# Patient Record
Sex: Female | Born: 1950
Health system: Southern US, Community
[De-identification: ages and names within clinical notes are randomized; demographics above are authoritative.]

## PROBLEM LIST (undated history)

## (undated) DIAGNOSIS — I639 Cerebral infarction, unspecified: Secondary | ICD-10-CM

## (undated) DIAGNOSIS — E669 Obesity, unspecified: Secondary | ICD-10-CM

## (undated) DIAGNOSIS — E785 Hyperlipidemia, unspecified: Secondary | ICD-10-CM

## (undated) DIAGNOSIS — N200 Calculus of kidney: Secondary | ICD-10-CM

## (undated) DIAGNOSIS — I10 Essential (primary) hypertension: Secondary | ICD-10-CM

## (undated) HISTORY — DX: Calculus of kidney: N20.0

## (undated) HISTORY — DX: Cerebral infarction, unspecified: I63.9

## (undated) HISTORY — DX: Hyperlipidemia, unspecified: E78.5

## (undated) HISTORY — DX: Obesity, unspecified: E66.9

---

## 1977-02-10 HISTORY — PX: TUBAL LIGATION: SHX77

## 1997-07-26 ENCOUNTER — Emergency Department (HOSPITAL_COMMUNITY): Admission: EM | Admit: 1997-07-26 | Discharge: 1997-07-26 | Payer: Self-pay | Admitting: Emergency Medicine

## 2007-02-11 HISTORY — PX: ROTATOR CUFF REPAIR: SHX139

## 2007-11-04 ENCOUNTER — Ambulatory Visit: Payer: Self-pay | Admitting: Cardiovascular Disease

## 2007-11-04 ENCOUNTER — Ambulatory Visit: Payer: Self-pay | Admitting: Orthopedic Surgery

## 2007-11-04 ENCOUNTER — Other Ambulatory Visit: Payer: Self-pay

## 2007-11-05 ENCOUNTER — Ambulatory Visit: Payer: Self-pay | Admitting: Orthopedic Surgery

## 2010-04-15 ENCOUNTER — Ambulatory Visit: Payer: Self-pay | Admitting: Family Medicine

## 2010-09-09 ENCOUNTER — Encounter (HOSPITAL_COMMUNITY): Payer: Self-pay | Admitting: Radiology

## 2010-09-09 ENCOUNTER — Emergency Department (HOSPITAL_COMMUNITY): Payer: BC Managed Care – PPO

## 2010-09-09 ENCOUNTER — Emergency Department (HOSPITAL_COMMUNITY)
Admission: EM | Admit: 2010-09-09 | Discharge: 2010-09-09 | Disposition: A | Discharge status: Home / Self Care | Payer: BC Managed Care – PPO | Attending: Emergency Medicine | Admitting: Emergency Medicine

## 2010-09-09 DIAGNOSIS — R197 Diarrhea, unspecified: Secondary | ICD-10-CM | POA: Insufficient documentation

## 2010-09-09 DIAGNOSIS — B9689 Other specified bacterial agents as the cause of diseases classified elsewhere: Secondary | ICD-10-CM | POA: Insufficient documentation

## 2010-09-09 DIAGNOSIS — R300 Dysuria: Secondary | ICD-10-CM | POA: Insufficient documentation

## 2010-09-09 DIAGNOSIS — N898 Other specified noninflammatory disorders of vagina: Secondary | ICD-10-CM | POA: Insufficient documentation

## 2010-09-09 DIAGNOSIS — Z79899 Other long term (current) drug therapy: Secondary | ICD-10-CM | POA: Insufficient documentation

## 2010-09-09 DIAGNOSIS — R109 Unspecified abdominal pain: Secondary | ICD-10-CM | POA: Insufficient documentation

## 2010-09-09 DIAGNOSIS — A499 Bacterial infection, unspecified: Secondary | ICD-10-CM | POA: Insufficient documentation

## 2010-09-09 DIAGNOSIS — R319 Hematuria, unspecified: Secondary | ICD-10-CM | POA: Insufficient documentation

## 2010-09-09 DIAGNOSIS — N12 Tubulo-interstitial nephritis, not specified as acute or chronic: Secondary | ICD-10-CM | POA: Insufficient documentation

## 2010-09-09 DIAGNOSIS — R6883 Chills (without fever): Secondary | ICD-10-CM | POA: Insufficient documentation

## 2010-09-09 DIAGNOSIS — R5381 Other malaise: Secondary | ICD-10-CM | POA: Insufficient documentation

## 2010-09-09 DIAGNOSIS — N760 Acute vaginitis: Secondary | ICD-10-CM | POA: Insufficient documentation

## 2010-09-09 DIAGNOSIS — E119 Type 2 diabetes mellitus without complications: Secondary | ICD-10-CM | POA: Insufficient documentation

## 2010-09-09 DIAGNOSIS — R112 Nausea with vomiting, unspecified: Secondary | ICD-10-CM | POA: Insufficient documentation

## 2010-09-09 DIAGNOSIS — R630 Anorexia: Secondary | ICD-10-CM | POA: Insufficient documentation

## 2010-09-09 DIAGNOSIS — R5383 Other fatigue: Secondary | ICD-10-CM | POA: Insufficient documentation

## 2010-09-09 HISTORY — DX: Essential (primary) hypertension: I10

## 2010-09-09 LAB — CBC
HCT: 38.4 % (ref 36.0–46.0)
Hemoglobin: 13.4 g/dL (ref 12.0–15.0)
MCV: 86.7 fL (ref 78.0–100.0)
Platelets: 204 10*3/uL (ref 150–400)
RBC: 4.43 MIL/uL (ref 3.87–5.11)
WBC: 11.5 10*3/uL — ABNORMAL HIGH (ref 4.0–10.5)

## 2010-09-09 LAB — WET PREP, GENITAL: Yeast Wet Prep HPF POC: NONE SEEN

## 2010-09-09 LAB — HEPATIC FUNCTION PANEL
AST: 14 U/L (ref 0–37)
Bilirubin, Direct: 0.1 mg/dL (ref 0.0–0.3)
Total Protein: 6.9 g/dL (ref 6.0–8.3)

## 2010-09-09 LAB — BASIC METABOLIC PANEL
BUN: 16 mg/dL (ref 6–23)
CO2: 27 mEq/L (ref 19–32)
Chloride: 104 mEq/L (ref 96–112)
Creatinine, Ser: 0.8 mg/dL (ref 0.50–1.10)
Glucose, Bld: 146 mg/dL — ABNORMAL HIGH (ref 70–99)

## 2010-09-09 LAB — URINALYSIS, ROUTINE W REFLEX MICROSCOPIC
Glucose, UA: NEGATIVE mg/dL
Ketones, ur: 15 mg/dL — AB
Protein, ur: >300 mg/dL — AB
Urobilinogen, UA: 0.2 mg/dL (ref 0.0–1.0)

## 2010-09-09 LAB — DIFFERENTIAL
Lymphocytes Relative: 16 % (ref 12–46)
Lymphs Abs: 1.9 10*3/uL (ref 0.7–4.0)
Monocytes Relative: 6 % (ref 3–12)
Neutro Abs: 8.9 10*3/uL — ABNORMAL HIGH (ref 1.7–7.7)
Neutrophils Relative %: 77 % (ref 43–77)

## 2010-09-09 LAB — URINE MICROSCOPIC-ADD ON

## 2010-09-09 LAB — LACTIC ACID, PLASMA: Lactic Acid, Venous: 2 mmol/L (ref 0.5–2.2)

## 2010-09-09 MED ORDER — IOHEXOL 300 MG/ML  SOLN
100.0000 mL | Freq: Once | INTRAMUSCULAR | Status: AC | PRN
  Administered 2010-09-09: 100 mL via INTRAVENOUS

## 2010-09-10 LAB — GC/CHLAMYDIA PROBE AMP, GENITAL
Chlamydia, DNA Probe: NEGATIVE
GC Probe Amp, Genital: NEGATIVE

## 2012-07-12 ENCOUNTER — Encounter (HOSPITAL_COMMUNITY): Payer: Self-pay | Admitting: Emergency Medicine

## 2012-07-12 ENCOUNTER — Emergency Department (HOSPITAL_COMMUNITY): Payer: BC Managed Care – PPO

## 2012-07-12 ENCOUNTER — Emergency Department (HOSPITAL_COMMUNITY)
Admission: EM | Admit: 2012-07-12 | Discharge: 2012-07-12 | Disposition: A | Discharge status: Home / Self Care | Payer: BC Managed Care – PPO | Attending: Emergency Medicine | Admitting: Emergency Medicine

## 2012-07-12 DIAGNOSIS — R12 Heartburn: Secondary | ICD-10-CM | POA: Insufficient documentation

## 2012-07-12 DIAGNOSIS — R079 Chest pain, unspecified: Secondary | ICD-10-CM

## 2012-07-12 DIAGNOSIS — I10 Essential (primary) hypertension: Secondary | ICD-10-CM

## 2012-07-12 DIAGNOSIS — R51 Headache: Secondary | ICD-10-CM | POA: Insufficient documentation

## 2012-07-12 DIAGNOSIS — Z79899 Other long term (current) drug therapy: Secondary | ICD-10-CM | POA: Insufficient documentation

## 2012-07-12 DIAGNOSIS — E119 Type 2 diabetes mellitus without complications: Secondary | ICD-10-CM | POA: Insufficient documentation

## 2012-07-12 DIAGNOSIS — R0789 Other chest pain: Secondary | ICD-10-CM | POA: Insufficient documentation

## 2012-07-12 LAB — BASIC METABOLIC PANEL
GFR calc Af Amer: >90 mL/min (ref >90)
GFR calc non Af Amer: 89 mL/min — ABNORMAL LOW (ref >90)
Glucose, Bld: 126 mg/dL — ABNORMAL HIGH (ref 70–99)
Potassium: 4.3 mEq/L (ref 3.5–5.1)
Sodium: 143 mEq/L (ref 135–145)

## 2012-07-12 LAB — POCT I-STAT TROPONIN I: Troponin i, poc: 0.01 ng/mL (ref 0.00–0.08)

## 2012-07-12 LAB — CBC
Hemoglobin: 14.3 g/dL (ref 12.0–15.0)
MCHC: 33.5 g/dL (ref 30.0–36.0)
RDW: 12.6 % (ref 11.5–15.5)
WBC: 6.5 10*3/uL (ref 4.0–10.5)

## 2012-07-12 MED ORDER — ASPIRIN 325 MG PO TABS
325.0000 mg | ORAL_TABLET | Freq: Once | ORAL | Status: DC
  Filled 2012-07-12: qty 1

## 2012-07-12 MED ORDER — ASPIRIN 325 MG PO TABS
325.0000 mg | ORAL_TABLET | Freq: Once | ORAL | Status: AC
Start: 2012-07-12 — End: 2012-07-12
  Administered 2012-07-12: 325 mg via ORAL
  Filled 2012-07-12: qty 1

## 2012-07-12 NOTE — ED Notes (Signed)
Pt c/o left sided CP intermittently x several days with radiation to arm and HA and groin pain; pt sent here for further eval by PCP

## 2012-07-12 NOTE — ED Notes (Signed)
ASA given By previous RN .

## 2012-07-12 NOTE — ED Provider Notes (Signed)
History     CSN: [id number]  Arrival date & time 07/12/12  1737   First MD Initiated Contact with Patient 07/12/12 1834      Chief Complaint  Patient presents with  . Chest Pain     Patient is a 62 y.o. female presenting with chest pain. The history is provided by the patient.  Chest Pain Pain location:  L chest Pain quality: aching   Pain radiates to:  L arm Pain radiates to the back: no   Pain severity:  Mild Onset quality:  Gradual Timing:  Intermittent Progression:  Improving Relieved by: belching. Worsened by:  Nothing tried Associated symptoms: headache and heartburn   Associated symptoms: no abdominal pain, no back pain, no diaphoresis, no dizziness, no lower extremity edema, no nausea, no near-syncope, no shortness of breath, no syncope and not vomiting   pt reports she has had episodes of left CP/arm pain  She reports episodes are brief (less than one minute) No associated diaphoresis/nausea/vomiting.  No weakness.  She is ambulatory.  No focal weakness She also mentions lower abdominal/groin pain that has since resolved.  She also reports mild HA and blurred vision early this morning that has since resolved. No exertional CP is reported No pleuritic pain is reported  She was seen by PCP and sent for evaluation She denies known h/o CAD    Past Medical History  Diagnosis Date  . Diabetes mellitus   . Hypertension    Family history -positive for CAD  History  Substance Use Topics  . Smoking status: Not on file  . Smokeless tobacco: Not on file  . Alcohol Use: Not on file    OB History   Grav Para Term Preterm Abortions TAB SAB Ect Mult Living                  Review of Systems  Constitutional: Negative for diaphoresis.  Respiratory: Negative for shortness of breath.   Cardiovascular: Positive for chest pain. Negative for syncope and near-syncope.  Gastrointestinal: Positive for heartburn. Negative for nausea, vomiting and abdominal pain.   Musculoskeletal: Negative for back pain.  Neurological: Positive for headaches. Negative for dizziness.  All other systems reviewed and are negative.    Allergies  Augmentin  Home Medications  No current outpatient prescriptions on file.  BP 179/78  Pulse 85  Temp(Src) 97.8 F (36.6 C) (Oral)  Resp 18  SpO2 95%  Physical Exam CONSTITUTIONAL: Well developed/well nourished HEAD: Normocephalic/atraumatic EYES: EOMI/PERRL ENMT: Mucous membranes moist NECK: supple no meningeal signs SPINE:entire spine nontender CV: S1/S2 noted, no murmurs/rubs/gallops noted LUNGS: Lungs are clear to auscultation bilaterally, no apparent distress ABDOMEN: soft, nontender, no rebound or guarding GU:no cva tenderness NEURO: Pt is awake/alert, moves all extremitiesx4 EXTREMITIES: pulses normal, full ROM, no calf tenderness, no LE edema SKIN: warm, color normal PSYCH: no abnormalities of mood noted  ED Course  Procedures  Labs Reviewed  BASIC METABOLIC PANEL - Abnormal; Notable for the following:    Glucose, Bld 126 (*)    GFR calc non Af Amer 89 (*)    All other components within normal limits  CBC  POCT I-STAT TROPONIN I   7:55 PM PCP office records and EKG reviewed PCP Felt patient should be monitored for CP Her CP is very atypical (several seconds of pain, no associated symptoms)   She has no active pain Will recheck troponin/ekg at 3hr mark (approximately 2030)  She can f/u with cardiology as outpatient I doubt PE/Dissection  at this time  Signed out to dr Patria Mane and Florentina Addison schinlever to f/u on ekg/troponin  MDM  Nursing notes including past medical history and social history reviewed and considered in documentation xrays reviewed and considered Labs/vital reviewed and considered        Date: 07/12/2012 - 1744  Rate: 72  Rhythm: normal sinus rhythm  QRS Axis: normal  Intervals: normal  ST/T Wave abnormalities: nonspecific ST changes  Conduction Disutrbances:none   Narrative Interpretation:   Old EKG Reviewed: compared to prior from PCP's office earlier today, no abrupt change   Date: 07/12/2012 1509 (from PCP office)  Rate: 58  Rhythm: sinus bradycardia  QRS Axis: normal  Intervals: normal  ST/T Wave abnormalities: nonspecific ST changes  Conduction Disutrbances:none     Joya Gaskins, MD 07/12/12 2005

## 2012-07-13 NOTE — ED Provider Notes (Signed)
Medical screening examination/treatment/procedure(s) were conducted as a shared visit with non-physician practitioner(s) and myself.  I personally evaluated the patient during the encounter   Joya Gaskins, MD 07/13/12 1109

## 2012-07-13 NOTE — ED Provider Notes (Signed)
Repeat EKG unchanged and delta troponin neg.  Results discussed w/ pt.  She continues to be asymptomatic.  D/c'd home.  Return precautions discussed.    Date: 07/13/2012  Rate: 66  Rhythm: normal sinus rhythm  QRS Axis: normal  Intervals: normal  ST/T Wave abnormalities: normal  Conduction Disutrbances:none  Narrative Interpretation:   Old EKG Reviewed: unchanged    Otilio Miu, PA-C 07/13/12 0009

## 2012-09-07 ENCOUNTER — Ambulatory Visit: Payer: BC Managed Care – PPO | Admitting: Cardiovascular Disease

## 2012-11-01 ENCOUNTER — Encounter: Payer: Self-pay | Admitting: *Deleted

## 2012-11-02 ENCOUNTER — Ambulatory Visit: Payer: BC Managed Care – PPO | Admitting: Cardiovascular Disease

## 2012-11-04 ENCOUNTER — Ambulatory Visit (INDEPENDENT_AMBULATORY_CARE_PROVIDER_SITE_OTHER): Payer: BC Managed Care – PPO | Admitting: Cardiovascular Disease

## 2012-11-04 ENCOUNTER — Encounter: Payer: Self-pay | Admitting: Cardiovascular Disease

## 2012-11-04 VITALS — BP 140/80 | HR 71 | Ht 65.0 in | Wt 219.0 lb

## 2012-11-04 DIAGNOSIS — R079 Chest pain, unspecified: Secondary | ICD-10-CM | POA: Insufficient documentation

## 2012-11-04 NOTE — Progress Notes (Addendum)
   History of Present Illness: 62 yo female with history of DM, HTN referred today for evaluation of chest pain. She has no prior cardiac disease. She has not been a smoker. She presented to the ED in June 2014 with chest pain. This was across the left side of the chest. EKG did not show ischemic changes. Cardiac markers negative.   Primary Care Physician: Hope Pigeon  Past Medical History  Diagnosis Date  . Diabetes mellitus   . Hypertension   . Nephrolithiasis     Past Surgical History  Procedure Laterality Date  . Tubal ligation  1979  . Rotator cuff repair  2009    Current Outpatient Prescriptions  Medication Sig Dispense Refill  . aspirin 81 MG tablet Take 81 mg by mouth daily.      Marland Kitchen glipiZIDE (GLUCOTROL) 10 MG tablet Take 5 mg by mouth 2 (two) times daily before a meal.      . metFORMIN (GLUCOPHAGE) 1000 MG tablet Take 500 mg by mouth 2 (two) times daily with a meal.       No current facility-administered medications for this visit.    Allergies  Allergen Reactions  . Augmentin [Amoxicillin-Pot Clavulanate]     Altered mental status  . Novocain [Procaine]     Altered mental status    History   Social History  . Marital Status: Married    Spouse Name: N/A    Number of Children: 4  . Years of Education: N/A   Occupational History  . Retired-Missionary    Social History Main Topics  . Smoking status: Never Smoker   . Smokeless tobacco: Not on file  . Alcohol Use: No  . Drug Use: No  . Sexual Activity: Not on file   Other Topics Concern  . Not on file   Social History Narrative  . No narrative on file    Family History  Problem Relation Age of Onset  . Diabetes Father   . Heart attack Father 27  . Heart attack Brother     Review of Systems:  As stated in the HPI and otherwise negative.   BP 140/80  Pulse 71  Ht 5\' 5"  (1.651 m)  Wt 219 lb (99.338 kg)  BMI 36.44 kg/m2  Physical Examination: General: Well developed, well nourished,  NAD HEENT: OP clear, mucus membranes moist SKIN: warm, dry. No rashes. Neuro: No focal deficits Musculoskeletal: Muscle strength 5/5 all ext Psychiatric: Mood and affect normal Neck: No JVD, no carotid bruits, no thyromegaly, no lymphadenopathy. Lungs:Clear bilaterally, no wheezes, rhonci, crackles Cardiovascular: Regular rate and rhythm. No murmurs, gallops or rubs. Abdomen:Soft. Bowel sounds present. Non-tender.  Extremities: No lower extremity edema. Pulses are 2 + in the bilateral DP/PT.  EKG: NSR, rate 71 bpm. Poor R wave progression.   Assessment and Plan:   1. Chest pain: Will arrange echo to assess LVEF, assess LV size and exclude structural heart disease. Will arrange exercise stress test to exclude ischemia.

## 2012-11-04 NOTE — Patient Instructions (Addendum)
Your physician recommends that you schedule a follow-up appointment in:  4-6 weeks.   Your physician has requested that you have an echocardiogram. Echocardiography is a painless test that uses sound waves to create images of your heart. It provides your doctor with information about the size and shape of your heart and how well your heart's chambers and valves are working. This procedure takes approximately one hour. There are no restrictions for this procedure.   Your physician has requested that you have an exercise tolerance test. Can be done with NP or PA.  For further information please visit [email or web address]. Please also follow instruction sheet, as given.    

## 2012-11-29 ENCOUNTER — Ambulatory Visit (INDEPENDENT_AMBULATORY_CARE_PROVIDER_SITE_OTHER): Payer: BC Managed Care – PPO | Admitting: Physician Assistant

## 2012-11-29 ENCOUNTER — Other Ambulatory Visit (HOSPITAL_COMMUNITY): Payer: Self-pay | Admitting: Cardiovascular Disease

## 2012-11-29 ENCOUNTER — Ambulatory Visit (HOSPITAL_COMMUNITY): Payer: BC Managed Care – PPO | Attending: Cardiology

## 2012-11-29 DIAGNOSIS — I519 Heart disease, unspecified: Secondary | ICD-10-CM | POA: Insufficient documentation

## 2012-11-29 DIAGNOSIS — R079 Chest pain, unspecified: Secondary | ICD-10-CM

## 2012-11-29 DIAGNOSIS — I359 Nonrheumatic aortic valve disorder, unspecified: Secondary | ICD-10-CM | POA: Insufficient documentation

## 2012-11-29 DIAGNOSIS — E119 Type 2 diabetes mellitus without complications: Secondary | ICD-10-CM | POA: Insufficient documentation

## 2012-11-29 DIAGNOSIS — I10 Essential (primary) hypertension: Secondary | ICD-10-CM | POA: Insufficient documentation

## 2012-11-29 DIAGNOSIS — R072 Precordial pain: Secondary | ICD-10-CM | POA: Insufficient documentation

## 2012-11-29 NOTE — Progress Notes (Signed)
Echocardiogram performed.  

## 2012-11-29 NOTE — Progress Notes (Signed)
Exercise Treadmill Test  Pre-Exercise Testing Evaluation Rhythm: normal sinus  Rate: 71     Test  Exercise Tolerance Test Ordering MD: Melene Muller, MD  Interpreting MD: Tereso Newcomer, PA-C  Unique Test No: 1  Treadmill:  1  Indication for ETT: chest pain - rule out ischemia  Contraindication to ETT: No   Stress Modality: exercise - treadmill  Cardiac Imaging Performed: non   Protocol: standard Bruce - maximal  Max BP:  204/83  Max MPHR (bpm):  157 85% MPR (bpm):  133  MPHR obtained (bpm):  157 % MPHR obtained:  100  Reached 85% MPHR (min:sec):  3:50 Total Exercise Time (min-sec):  7:01  Workload in METS:  8.5 Borg Scale: 14  Reason ETT Terminated:  desired heart rate attained    ST Segment Analysis At Rest: normal ST segments - no evidence of significant ST depression With Exercise: non-specific ST changes  Other Information Arrhythmia:  No Angina during ETT:  absent (0) Quality of ETT:  diagnostic  ETT Interpretation:  normal - no evidence of ischemia by ST analysis  Comments: Good exercise capacity. No chest pain. Normal BP response to exercise. Insignificant up-sloping ST depression.  No changes to suggest ischemia.   Recommendations: F/u with Dr. Verne Carrow as planned. Signed,  Tereso Newcomer, PA-C   11/29/2012 9:31 AM

## 2012-12-10 ENCOUNTER — Encounter: Payer: BC Managed Care – PPO | Admitting: Cardiovascular Disease

## 2012-12-10 NOTE — Progress Notes (Signed)
cancel

## 2013-11-24 ENCOUNTER — Telehealth: Payer: Self-pay | Admitting: Cardiovascular Disease

## 2013-11-24 NOTE — Telephone Encounter (Signed)
Received transferred call from scheduling. Spoke with pt who reports she was seen at [facility] in [place] today for 5 day history of chest pain. Pt reports she was told EKG was different than previous EKG. She was also given injection of muscle relaxer for shoulder pain. Given prescription for NTG to use as needed.  Pt reports she is not having chest pain at current time. Complaining of feeling tired and has headache. Pt reports they did not feel she needed to go to ED but should follow up with cardiology. She reports they were going to refer to her cardiology in [place] but she wanted to follow up in our office. Will discuss with provider in office and call pt back.  She is aware if chest pain occurs again she should call 911.

## 2013-11-24 NOTE — Telephone Encounter (Signed)
New Message     Pt calling c/o of chest pain, seen at [facility] in [facility] today and was told to f/u with our office immediately.

## 2013-11-24 NOTE — Telephone Encounter (Signed)
Spoke with pt who is unable to be here today. Appt made for pt to see Truitt Merle, NP tomorrow at 9:00. Pt will contact [facility] and ask them to fax EKG and office note to our office.

## 2013-11-24 NOTE — Telephone Encounter (Signed)
Reviewed with Melina Copa, PA and she can see pt today at 3:00. I placed call to pt and left message on home and cell numbers to call office.

## 2013-11-25 ENCOUNTER — Ambulatory Visit (INDEPENDENT_AMBULATORY_CARE_PROVIDER_SITE_OTHER): Payer: BC Managed Care – PPO | Admitting: Nurse Practitioner

## 2013-11-25 ENCOUNTER — Encounter: Payer: Self-pay | Admitting: Nurse Practitioner

## 2013-11-25 VITALS — BP 130/70 | HR 65 | Ht 65.5 in | Wt 225.0 lb

## 2013-11-25 DIAGNOSIS — R079 Chest pain, unspecified: Secondary | ICD-10-CM

## 2013-11-25 DIAGNOSIS — E785 Hyperlipidemia, unspecified: Secondary | ICD-10-CM | POA: Insufficient documentation

## 2013-11-25 DIAGNOSIS — E119 Type 2 diabetes mellitus without complications: Secondary | ICD-10-CM | POA: Insufficient documentation

## 2013-11-25 LAB — CBC
HCT: 43.2 % (ref 36.0–46.0)
Hemoglobin: 14.3 g/dL (ref 12.0–15.0)
MCHC: 33 g/dL (ref 30.0–36.0)
MCV: 88.1 fl (ref 78.0–100.0)
Platelets: 180 10*3/uL (ref 150.0–400.0)
RBC: 4.9 Mil/uL (ref 3.87–5.11)
RDW: 12.8 % (ref 11.5–15.5)
WBC: 5.7 10*3/uL (ref 4.0–10.5)

## 2013-11-25 LAB — CK TOTAL AND CKMB (NOT AT ARMC)
CK, MB: <1 ng/mL (ref 0.3–4.0)
Total CK: 62 U/L (ref 7–177)

## 2013-11-25 LAB — BASIC METABOLIC PANEL
BUN: 18 mg/dL (ref 6–23)
CO2: 22 mEq/L (ref 19–32)
Calcium: 9.4 mg/dL (ref 8.4–10.5)
Chloride: 104 mEq/L (ref 96–112)
Creatinine, Ser: 0.8 mg/dL (ref 0.4–1.2)
GFR: 74.47 mL/min (ref >60.00)
Glucose, Bld: 199 mg/dL — ABNORMAL HIGH (ref 70–99)
Potassium: 4.4 mEq/L (ref 3.5–5.1)
Sodium: 138 mEq/L (ref 135–145)

## 2013-11-25 LAB — TROPONIN I: Troponin I: <0.3 ng/mL (ref <0.30)

## 2013-11-25 NOTE — Patient Instructions (Addendum)
We will be checking the following labs today BMET, CBC and Troponin  Stay on your current medicines  We will arrange for a Myoview study -  Lexiscan  We will update your echocardiogram  See Dr. Angelena Form after your studies are completed  Call the [place] office at [phone] if you have any questions, problems or concerns.

## 2013-11-25 NOTE — Progress Notes (Signed)
[name] Date of Birth: 07-09-1950 Medical Record #[id number]  History of Present Illness: [name] is seen back today for a work in visit. Seen for Dr. Angelena Form. She is a 63 year old female with no known CAD but has HTN, HLD, obesity and DM.   She was seen here in September of 2014 after an ER visit - had presented with chest pain - EKG and CE negative. Echo and GXT arranged.   Called yesterday - had been seen at [facility] in [facility] for a 5 day history of chest pain. Was told that her EKG was abnormal and was given an injection for a muscle relaxer. Follow up made here.   Comes in today. Here with her husband. She was feeling fine about 2 weeks ago - then was raking her yard. Initially doing ok but about 3 days ago and pain across her upper chest and going up her right side of her neck and had a headache. This has come and gone. Worse with moving/turning movements. Not exertional. For the last 2 nights however, she has had an uncomfortable feeling across her chest that did not seem positional. The injection that she had yesterday did help - until last night - the upper chest pain returned. Currently trying to stay very still and is afraid to move. Previously with no chest pain. Not short of breath. AI1 over 8. Now on insulin. On statin now. No regular exercise. Remains obese.    Current Outpatient Prescriptions  Medication Sig Dispense Refill  . amLODipine (NORVASC) 5 MG tablet Take 5 mg by mouth daily.       Marland Kitchen aspirin 81 MG tablet Take 81 mg by mouth daily.      Marland Kitchen atorvastatin (LIPITOR) 10 MG tablet Take 10 mg by mouth daily at 6 PM.       . BAYER CONTOUR TEST test strip 1 each by Other route as needed.       . cyclobenzaprine (FLEXERIL) 5 MG tablet Take 5 mg by mouth at bedtime.       Marland Kitchen glipiZIDE (GLUCOTROL) 10 MG tablet Take 5 mg by mouth 2 (two) times daily before a meal.      . LANTUS SOLOSTAR 100 UNIT/ML Solostar Pen Inject 15 Units into the skin daily at 10 pm.       . metFORMIN  (GLUCOPHAGE) 1000 MG tablet Take 500 mg by mouth 2 (two) times daily with a meal.      . NITROSTAT 0.4 MG SL tablet Place 0.4 mg under the tongue every 5 (five) minutes as needed.        No current facility-administered medications for this visit.    Allergies  Allergen Reactions  . Augmentin [Amoxicillin-Pot Clavulanate]     Altered mental status  . Novocain [Procaine]     Altered mental status    Past Medical History  Diagnosis Date  . Diabetes mellitus   . Hypertension   . Nephrolithiasis   . Obese   . HLD (hyperlipidemia)     Past Surgical History  Procedure Laterality Date  . Tubal ligation  1979  . Rotator cuff repair  2009    History  Smoking status  . Never Smoker   Smokeless tobacco  . Not on file    History  Alcohol Use No    Family History  Problem Relation Age of Onset  . Diabetes Father   . Heart attack Father 31  . Heart attack Brother   .  Heart disease Mother     hx cabg at 47    Review of Systems: The review of systems is per the HPI.  All other systems were reviewed and are negative.  Physical Exam: BP 130/70  Pulse 65  Ht 5' 5.5" (1.664 m)  Wt 225 lb (102.059 kg)  BMI 36.86 kg/m2 Patient is very pleasant and in no acute distress. She is obese. Weight is up 6 pounds. Skin is warm and dry. Color is normal.  HEENT is unremarkable. Normocephalic/atraumatic. PERRL. Sclera are nonicteric. Neck is supple. No masses. No JVD. Lungs are clear. Cardiac exam shows a regular rate and rhythm. Abdomen is soft. Extremities are without edema. Gait and ROM are intact. No gross neurologic deficits noted.  Wt Readings from Last 3 Encounters:  11/25/13 225 lb (102.059 kg)  11/04/12 219 lb (99.338 kg)    LABORATORY DATA/PROCEDURES:  EKG from yesterday at [place] shows sinus with lateral T wave changes.   EKG here today shows sinus with nonspecific changes today.   Lab Results  Component Value Date   WBC 6.5 07/12/2012   HGB 14.3 07/12/2012   HCT  42.7 07/12/2012   PLT 211 07/12/2012   GLUCOSE 126* 07/12/2012   ALT 16 09/09/2010   AST 14 09/09/2010   NA 143 07/12/2012   K 4.3 07/12/2012   CL 102 07/12/2012   CREATININE 0.72 07/12/2012   BUN 15 07/12/2012   CO2 29 07/12/2012    BNP (last 3 results) No results found for this basename: PROBNP,  in the last 8760 hours   Echo Study Conclusions from October 2014  - Left ventricle: The cavity size was normal. There was moderate concentric hypertrophy. Systolic function was normal. The estimated ejection fraction was in the range of 55% to 60%. Wall motion was normal; there were no regional wall motion abnormalities. Doppler parameters are consistent with abnormal left ventricular relaxation (grade 1 diastolic dysfunction). - Aortic valve: Mild to moderate regurgitation. - Atrial septum: No defect or patent foramen ovale was identified.  ETT Interpretation: normal - no evidence of ischemia by ST analysis  Comments:  Good exercise capacity.  No chest pain.  Normal BP response to exercise.  Insignificant up-sloping ST depression. No changes to suggest ischemia.  Recommendations:  F/u with Dr. Lauree Chandler as planned.  Signed,  Richardson Dopp, PA-C  11/29/2012 9:31 AM    Assessment / Plan: 1. Chest pain - seems more musculoskeletal - but with lots of CV risk factors - EKG yesterday abnormal - will check cardiac enzymes today. Arrange for 2 day Lexiscan and update her echo. Further disposition to follow.   2. Abnormal EKG  3. DM -  Uncontrolled.   4. HLD -  Now on statin therapy.  See back after her studies are completed. She has NTG on hand. Further disposition to follow.   Patient is agreeable to this plan and will call if any problems develop in the interim.   Burtis Junes, RN, [place] [address] [place], [place]  [phone]

## 2013-11-30 ENCOUNTER — Ambulatory Visit (HOSPITAL_BASED_OUTPATIENT_CLINIC_OR_DEPARTMENT_OTHER): Payer: BC Managed Care – PPO

## 2013-11-30 ENCOUNTER — Ambulatory Visit (HOSPITAL_COMMUNITY): Payer: BC Managed Care – PPO | Attending: Cardiology | Admitting: Radiology

## 2013-11-30 ENCOUNTER — Other Ambulatory Visit (HOSPITAL_COMMUNITY): Payer: BC Managed Care – PPO | Admitting: Cardiology

## 2013-11-30 ENCOUNTER — Encounter (HOSPITAL_COMMUNITY): Payer: Self-pay

## 2013-11-30 ENCOUNTER — Telehealth: Payer: Self-pay | Admitting: Cardiovascular Disease

## 2013-11-30 VITALS — BP 146/78 | HR 61 | Resp 12 | Ht 65.0 in | Wt 224.0 lb

## 2013-11-30 DIAGNOSIS — I351 Nonrheumatic aortic (valve) insufficiency: Secondary | ICD-10-CM

## 2013-11-30 DIAGNOSIS — Z794 Long term (current) use of insulin: Secondary | ICD-10-CM | POA: Insufficient documentation

## 2013-11-30 DIAGNOSIS — E119 Type 2 diabetes mellitus without complications: Secondary | ICD-10-CM | POA: Insufficient documentation

## 2013-11-30 DIAGNOSIS — R079 Chest pain, unspecified: Secondary | ICD-10-CM

## 2013-11-30 DIAGNOSIS — R9431 Abnormal electrocardiogram [ECG] [EKG]: Secondary | ICD-10-CM | POA: Diagnosis not present

## 2013-11-30 DIAGNOSIS — E785 Hyperlipidemia, unspecified: Secondary | ICD-10-CM | POA: Diagnosis not present

## 2013-11-30 MED ORDER — PERFLUTREN LIPID MICROSPHERE
1.0000 mL | Freq: Once | INTRAVENOUS | Status: AC
  Administered 2013-11-30: 1 mL via INTRAVENOUS

## 2013-11-30 MED ORDER — REGADENOSON 0.4 MG/5ML IV SOLN
0.4000 mg | Freq: Once | INTRAVENOUS | Status: AC
  Administered 2013-11-30: 0.4 mg via INTRAVENOUS

## 2013-11-30 MED ORDER — TECHNETIUM TC 99M SESTAMIBI GENERIC - CARDIOLITE
30.0000 | Freq: Once | INTRAVENOUS | Status: AC | PRN
  Administered 2013-11-30: 30 via INTRAVENOUS

## 2013-11-30 MED ORDER — TECHNETIUM TC 99M SESTAMIBI GENERIC - CARDIOLITE
10.0000 | Freq: Once | INTRAVENOUS | Status: AC | PRN
  Administered 2013-11-30: 10 via INTRAVENOUS

## 2013-11-30 NOTE — Telephone Encounter (Signed)
New message  ° ° °Patient calling for test results.   °

## 2013-11-30 NOTE — Telephone Encounter (Signed)
Spoke with pt and reviewed echo and stress test results with her.

## 2013-11-30 NOTE — Progress Notes (Signed)
[facility] [address] [place], [name] [address] [phone]    Cardiology Nuclear Med Study  Jacqueline [name] is a 63 y.o. female     MRN : [id number]     DOB: 08/18/1950  Procedure Date: 11/30/2013  Nuclear Med Background Indication for Stress Test:  Evaluation for Ischemia and Abnormal EKG History:  No known CAD Cardiac Risk Factors: IDDM and Lipids  Symptoms:  Chest Pain and Chest Pain (last date of chest discomfort was two weeks ago)   Nuclear Pre-Procedure Caffeine/Decaff Intake:  None> 12 hrs NPO After: 8:00pm   Lungs:  clear O2 Sat: 97% on room air. IV 0.9% NS with Angio Cath:  22g  IV Site: R Wrist x 1, tolerated well IV Started by:  Irven Baltimore, RN  Chest Size (in):  44 Cup Size: DD  Height: 5\' 5"  (1.651 m)  Weight:  224 lb (101.606 kg)  BMI:  Body mass index is 37.28 kg/(m^2). Tech Comments:  No Lantus Insulin last night. No insulin, Metformin,or Glipizide this am. Irven Baltimore, RN    Nuclear Med Study 1 or 2 day study: 1 day  Stress Test Type:  Carlton Adam  Reading MD: N/A  Order Authorizing Provider:  Lauree Chandler, MD  Resting Radionuclide: Technetium 40m Sestamibi  Resting Radionuclide Dose: 11.0 mCi   Stress Radionuclide:  Technetium 59m Sestamibi  Stress Radionuclide Dose: 33.0 mCi           Stress Protocol Rest HR: 61 Stress HR: 89  Rest BP: 146/78 Stress BP: 134/83  Exercise Time (min): n/a METS: n/a           Dose of Adenosine (mg):  n/a Dose of Lexiscan: 0.4 mg  Dose of Atropine (mg): n/a Dose of Dobutamine: n/a mcg/kg/min (at max HR)  Stress Test Technologist: Glade Lloyd, BS-ES  Nuclear Technologist:  Margie Ege     Rest Procedure:  Myocardial perfusion imaging was performed at rest 45 minutes following the intravenous administration of Technetium 44m Sestamibi. Rest ECG: Normal sinus rhythm. Decrease anterior R wave progression. Diffuse nonspecific ST flattening.  Stress Procedure:  The  patient received IV Lexiscan 0.4 mg over 15-seconds.  Technetium 71m Sestamibi injected at 30-seconds.  Quantitative spect images were obtained after a 45 minute delay.  During the infusion of Lexiscan the patient had a cough and complained of SOB and headache.   These symptoms began to resolve in recovery.  Stress ECG: No significant change from baseline ECG  QPS Raw Data Images:  Normal; no motion artifact; normal heart/lung ratio. Stress Images:  Normal homogeneous uptake in all areas of the myocardium. Rest Images:  Normal homogeneous uptake in all areas of the myocardium. Subtraction (SDS):  No evidence of ischemia. Transient Ischemic Dilatation (Normal <1.22):  1.01 Lung/Heart Ratio (Normal <0.45):  0.31  Quantitative Gated Spect Images QGS EDV:  67 ml QGS ESV:  24 ml  Impression Exercise Capacity:  Lexiscan with no exercise. BP Response:  Normal blood pressure response. Clinical Symptoms:  Cough and shortness of breath ECG Impression:  No significant ST segment change suggestive of ischemia. Comparison with Prior Nuclear Study: No images to compare  Overall Impression:  Normal stress nuclear study. There is no scar or ischemia. This is a low risk scan.  LV Ejection Fraction: 64%.  LV Wall Motion:  Normal Wall Motion.  Dola Argyle , MD

## 2013-11-30 NOTE — Progress Notes (Signed)
Patient ID: Jacqueline [name], female   DOB: May 02, 1950, 63 y.o.   MRN: [id number] [name] was given Activated Definity 1 mL IVP at 0826 today with no immediate symptoms. At [phone] the patient complained of new onset headache 5/10 right back part of head and became flushed. BP 130/70, HR 72, Respirations 12, O2 sat 98% RA. Patient denied chest pain or SOB. At 0845 BP 120/80, HR 66 Resp 12 with continuous of headache. 0856 BP 120/70 HR 72, Resp 14 headache decreased to 2/10. At 0859 headache subsided completely and the patient was symptom free. Irven Baltimore, RN.

## 2013-11-30 NOTE — Progress Notes (Signed)
2D Echo completed. 11/30/2013

## 2014-01-02 ENCOUNTER — Encounter: Payer: BC Managed Care – PPO | Admitting: Cardiovascular Disease

## 2014-01-02 NOTE — Progress Notes (Signed)
No show

## 2014-01-31 ENCOUNTER — Encounter: Payer: Self-pay | Admitting: Cardiovascular Disease

## 2016-10-17 ENCOUNTER — Emergency Department (HOSPITAL_BASED_OUTPATIENT_CLINIC_OR_DEPARTMENT_OTHER): Payer: Medicare Other

## 2016-10-17 ENCOUNTER — Other Ambulatory Visit: Payer: Self-pay

## 2016-10-17 ENCOUNTER — Emergency Department (HOSPITAL_BASED_OUTPATIENT_CLINIC_OR_DEPARTMENT_OTHER)
Admission: EM | Admit: 2016-10-17 | Discharge: 2016-10-17 | Disposition: A | Discharge status: Home / Self Care | Payer: Medicare Other | Attending: Emergency Medicine | Admitting: Emergency Medicine

## 2016-10-17 ENCOUNTER — Encounter (HOSPITAL_BASED_OUTPATIENT_CLINIC_OR_DEPARTMENT_OTHER): Payer: Self-pay | Admitting: *Deleted

## 2016-10-17 DIAGNOSIS — R079 Chest pain, unspecified: Secondary | ICD-10-CM

## 2016-10-17 DIAGNOSIS — E119 Type 2 diabetes mellitus without complications: Secondary | ICD-10-CM | POA: Diagnosis not present

## 2016-10-17 DIAGNOSIS — R109 Unspecified abdominal pain: Secondary | ICD-10-CM | POA: Diagnosis not present

## 2016-10-17 DIAGNOSIS — Z7982 Long term (current) use of aspirin: Secondary | ICD-10-CM | POA: Insufficient documentation

## 2016-10-17 DIAGNOSIS — Z79899 Other long term (current) drug therapy: Secondary | ICD-10-CM | POA: Diagnosis not present

## 2016-10-17 DIAGNOSIS — Z794 Long term (current) use of insulin: Secondary | ICD-10-CM | POA: Insufficient documentation

## 2016-10-17 DIAGNOSIS — I10 Essential (primary) hypertension: Secondary | ICD-10-CM | POA: Insufficient documentation

## 2016-10-17 DIAGNOSIS — M549 Dorsalgia, unspecified: Secondary | ICD-10-CM | POA: Diagnosis not present

## 2016-10-17 LAB — COMPREHENSIVE METABOLIC PANEL
ALT: 22 U/L (ref 14–54)
AST: 21 U/L (ref 15–41)
Albumin: 4.2 g/dL (ref 3.5–5.0)
Alkaline Phosphatase: 85 U/L (ref 38–126)
Anion gap: 8 (ref 5–15)
BILIRUBIN TOTAL: 0.8 mg/dL (ref 0.3–1.2)
BUN: 16 mg/dL (ref 6–20)
CHLORIDE: 103 mmol/L (ref 101–111)
CO2: 26 mmol/L (ref 22–32)
Calcium: 9.1 mg/dL (ref 8.9–10.3)
Creatinine, Ser: 0.66 mg/dL (ref 0.44–1.00)
Glucose, Bld: 232 mg/dL — ABNORMAL HIGH (ref 65–99)
POTASSIUM: 4.5 mmol/L (ref 3.5–5.1)
Sodium: 137 mmol/L (ref 135–145)
TOTAL PROTEIN: 7.1 g/dL (ref 6.5–8.1)

## 2016-10-17 LAB — CBC WITH DIFFERENTIAL/PLATELET
Basophils Absolute: 0 10*3/uL (ref 0.0–0.1)
Basophils Relative: 0 %
EOS PCT: 3 %
Eosinophils Absolute: 0.2 10*3/uL (ref 0.0–0.7)
HEMATOCRIT: 42.5 % (ref 36.0–46.0)
Hemoglobin: 14.4 g/dL (ref 12.0–15.0)
LYMPHS ABS: 1.6 10*3/uL (ref 0.7–4.0)
LYMPHS PCT: 27 %
MCH: 29.4 pg (ref 26.0–34.0)
MCHC: 33.9 g/dL (ref 30.0–36.0)
MCV: 86.9 fL (ref 78.0–100.0)
MONO ABS: 0.5 10*3/uL (ref 0.1–1.0)
MONOS PCT: 9 %
NEUTROS ABS: 3.7 10*3/uL (ref 1.7–7.7)
Neutrophils Relative %: 61 %
PLATELETS: 153 10*3/uL (ref 150–400)
RBC: 4.89 MIL/uL (ref 3.87–5.11)
RDW: 12.5 % (ref 11.5–15.5)
WBC: 6 10*3/uL (ref 4.0–10.5)

## 2016-10-17 LAB — D-DIMER, QUANTITATIVE (NOT AT ARMC): D DIMER QUANT: 0.61 ug{FEU}/mL — AB (ref 0.00–0.50)

## 2016-10-17 LAB — LIPASE, BLOOD: LIPASE: 25 U/L (ref 11–51)

## 2016-10-17 LAB — TROPONIN I

## 2016-10-17 MED ORDER — SUCRALFATE 1 G PO TABS: 1.0000 g | ORAL_TABLET | Freq: Three times a day (TID) | ORAL | 0 refills | Status: DC

## 2016-10-17 MED ORDER — IOPAMIDOL (ISOVUE-370) INJECTION 76%
100.0000 mL | Freq: Once | INTRAVENOUS | Status: AC | PRN
  Administered 2016-10-17: 100 mL via INTRAVENOUS

## 2016-10-17 MED ORDER — BACLOFEN 10 MG PO TABS: 10.0000 mg | ORAL_TABLET | Freq: Three times a day (TID) | ORAL | 0 refills | Status: DC

## 2016-10-17 MED ORDER — FAMOTIDINE 40 MG PO TABS: 40.0000 mg | ORAL_TABLET | Freq: Every day | ORAL | 0 refills | Status: DC

## 2016-10-17 MED FILL — FAMOTIDINE 40 MG TABLET: 40 | 30 days supply | Qty: 30 | Fill #0

## 2016-10-17 MED FILL — SUCRALFATE 1 GM TABLET: 1 | 7 days supply | Qty: 30 | Fill #0

## 2016-10-17 MED FILL — BACLOFEN 10 MG TABLET: 10 | 20 days supply | Qty: 20 | Fill #0

## 2016-10-17 NOTE — ED Triage Notes (Signed)
Right scapula pain for a week with radiation into the center of her chest. She saw her MD this am and was told to come to the ED to r/o PE. She traveled to [place] in a car last month.

## 2016-10-17 NOTE — ED Notes (Signed)
IV attempted x2 without success.

## 2016-10-17 NOTE — ED Notes (Signed)
Pt on cardiac monitor and auto VS 

## 2016-10-17 NOTE — Discharge Instructions (Signed)
Take baclofen for back spasms. Take tylenol for pain. Pepcid and carafate for stomach problems. Follow up with family doctor for recheck if not improving.

## 2016-10-17 NOTE — ED Provider Notes (Signed)
[facility] Provider Note   CSN: [id number] Arrival date & time: 10/17/16  1108     History   Chief Complaint Chief Complaint  Patient presents with  . Chest Pain    HPI Jacqueline Jacqueline [name] is a 66 y.o. female.  HPI [facility] Jacqueline [name] is a 66 y.o. female presents to emergency department complaining of right-sided back pain that radiates into the right chest. Patient states Jacqueline [name] symptoms started 1 week ago. She states pain is worse with taking deep breath and with activity. She states laying down still makes pain better. She has taken ibuprofen for this pain which helps a little. She denies any shortness of breath. Denies any fever or chills. She reports recent travel to and from [place] by car, states around 1 month ago. Was seen today by Jacqueline [name] family doctor was sent here for PE rule out. She denies any history of blood clots in the past. No recent surgeries. Does not take any blood thinners.  Past Medical History:  Diagnosis Date  . Diabetes mellitus   . HLD (hyperlipidemia)   . Hypertension   . Nephrolithiasis   . Obese     Patient Active Problem List   Diagnosis Date Noted  . Morbid obesity ([name]) 11/25/2013  . HLD (hyperlipidemia) 11/25/2013  . DM type 2 (diabetes mellitus, type 2) ([name]) 11/25/2013  . Chest pain 11/04/2012    Past Surgical History:  Procedure Laterality Date  . ROTATOR CUFF REPAIR  2009  . TUBAL LIGATION  1979    OB History    No data available       Home Medications    Prior to Admission medications   Medication Sig Start Date End Date Taking? Authorizing Provider  glipiZIDE (GLUCOTROL) 10 MG tablet Take 5 mg by mouth 2 (two) times daily before a meal.   Yes [provider]  metFORMIN (GLUCOPHAGE) 1000 MG tablet Take 500 mg by mouth 2 (two) times daily with a meal.   Yes [provider]  NITROSTAT 0.4 MG SL tablet Place 0.4 mg under the tongue every 5 (five) minutes as needed.  11/24/13  Yes [provider]    amLODipine (NORVASC) 5 MG tablet Take 5 mg by mouth daily.  10/31/13   [provider]  aspirin 81 MG tablet Take 81 mg by mouth daily.    [provider]  atorvastatin (LIPITOR) 10 MG tablet Take 10 mg by mouth daily at 6 PM.  10/31/13   [provider]  BAYER CONTOUR TEST test strip 1 each by Other route as needed.  11/05/13   [provider]  cyclobenzaprine (FLEXERIL) 5 MG tablet Take 5 mg by mouth at bedtime.  11/24/13   [provider]  LANTUS SOLOSTAR 100 UNIT/ML Solostar Pen Inject 15 Units into the skin daily at 10 pm.  11/22/13   [provider]    Family History Family History  Problem Relation Age of Onset  . Diabetes Father   . Heart attack Father 54  . Heart disease Mother        hx cabg at 7  . Heart attack Brother     Social History Social History  Substance Use Topics  . Smoking status: Never Smoker  . Smokeless tobacco: Never Used  . Alcohol use No     Allergies   Augmentin [amoxicillin-pot clavulanate]; Definity [perflutren lipid microsphere]; and Novocain [procaine]   Review of Systems Review of Systems  Constitutional: Negative for chills and  fever.  Respiratory: Negative for cough, chest tightness and shortness of breath.   Cardiovascular: Positive for chest pain. Negative for palpitations and leg swelling.  Gastrointestinal: Negative for abdominal pain, diarrhea, nausea and vomiting.  Genitourinary: Negative for dysuria, flank pain, pelvic pain, vaginal bleeding, vaginal discharge and vaginal pain.  Musculoskeletal: Positive for arthralgias and back pain. Negative for myalgias, neck pain and neck stiffness.  Skin: Negative for rash.  Neurological: Negative for dizziness, weakness and headaches.  All other systems reviewed and are negative.    Physical Exam Updated Vital Signs BP 134/81   Pulse 71   Temp 98 F (36.7 C) (Oral)   Resp 16   Ht 5\' 5"  (1.651 m)   Wt 96.6 kg (213 lb)   SpO2  100%   BMI 35.45 kg/m   Physical Exam  Constitutional: She appears well-developed and well-nourished. No distress.  HENT:  Head: Normocephalic.  Eyes: Conjunctivae are normal.  Neck: Neck supple.  Cardiovascular: Normal rate, regular rhythm and normal heart sounds.   Pulmonary/Chest: Effort normal and breath sounds normal. No respiratory distress. She has no wheezes. She has no rales.  Abdominal: Soft. Bowel sounds are normal. She exhibits no distension. There is no rebound.  Epigastric and RUQ tenderness  Musculoskeletal: She exhibits no edema.  Neurological: She is alert.  Skin: Skin is warm and dry.  Psychiatric: She has a normal mood and affect. Jacqueline [name] behavior is normal.  Nursing note and vitals reviewed.    ED Treatments / Results  Labs (all labs ordered are listed, but only abnormal results are displayed) Labs Reviewed  COMPREHENSIVE METABOLIC PANEL - Abnormal; Notable for the following:       Result Value   Glucose, Bld 232 (*)    All other components within normal limits  D-DIMER, QUANTITATIVE (NOT AT [facility]) - Abnormal; Notable for the following:    D-Dimer, Quant 0.61 (*)    All other components within normal limits  CBC WITH DIFFERENTIAL/PLATELET  TROPONIN I  LIPASE, BLOOD    EKG  EKG Interpretation  Date/Time:  Friday October 17 2016 11:14:18 EDT Ventricular Rate:  69 PR Interval:  138 QRS Duration: 78 QT Interval:  390 QTC Calculation: 417 R Axis:   -14 Text Interpretation:  Normal sinus rhythm Low voltage QRS Cannot rule out Anterior infarct , age undetermined Abnormal ECG No significant change since last tracing Confirmed by Wandra Arthurs [phone]) on 10/17/2016 11:16:38 AM Also confirmed by Wandra Arthurs [phone]), editor Philomena Doheny [phone])  on 10/17/2016 11:40:35 AM       Radiology Dg Chest 2 View  Result Date: 10/17/2016 CLINICAL DATA:  Pt having chest tightness and med back pain for a week,nonsmoker EXAM: CHEST - 2 VIEW COMPARISON:  01/23/2016  FINDINGS: Lungs are clear. Heart size and mediastinal contours are within normal limits. No effusion.  No pneumothorax. Anterior vertebral endplate spurring at multiple levels in the mid thoracic spine. IMPRESSION: No acute cardiopulmonary disease. Electronically Signed   By: Lucrezia Europe M.D.   On: 10/17/2016 11:40    Procedures Procedures (including critical care time)  Medications Ordered in ED Medications - No data to display   Initial Impression / Assessment and Plan / ED Course  I have reviewed the triage vital signs and the nursing notes.  Pertinent labs & imaging results that were available during my care of the patient were reviewed by me and considered in my medical decision making (see chart for details).     Patient  in emergency department right-sided pleuritic chest pain. Recent travel by car. Jacqueline [name] vital signs are normal at this time. She denies any shortness of breath. Jacqueline [name] oxygen saturation is 100% on room air. Jacqueline [name] heart rate is 71. She appears to slightly anxious. Pain is also reproducible with movement of Jacqueline [name] arm. Question whether this could be musculoskeletal, however will get a d-dimer and a chest x-ray as well as basic labs  1:57 PM CT images negative. I reassessed the patient, she is now complaining of some abdominal pain. She states that bowel pain has, and went, complaining of acid reflux symptoms such as belching, burping, burning and epigastric area as well. He does have some tenderness in right upper quadrant now. I will get ultrasound of right upper quadrant, to rule out cholecystitis.  3:23 PM [place] and lipase normal. Home with baclofen for possible spasms. Start on Pepcid and Carafate, for abdominal discomfort and nausea. Follow-up with family doctor.  Vitals:   10/17/16 1330 10/17/16 1400 10/17/16 1430 10/17/16 1507  BP: 129/64 123/73 130/66 125/68  Pulse: 64 63 60 63  Resp: 12 17 (!) 21 11  Temp:      TempSrc:      SpO2: 100% 95% 96% 99%  Weight:      Height:          Final Clinical Impressions(s) / ED Diagnoses   Final diagnoses:  Abdominal pain  Back pain, unspecified back location, unspecified back pain laterality, unspecified chronicity  Chest pain, unspecified type    New Prescriptions New Prescriptions   BACLOFEN (LIORESAL) 10 MG TABLET    Take 1 tablet (10 mg total) by mouth 3 (three) times daily.   FAMOTIDINE (PEPCID) 40 MG TABLET    Take 1 tablet (40 mg total) by mouth daily.   SUCRALFATE (CARAFATE) 1 G TABLET    Take 1 tablet (1 g total) by mouth 4 (four) times daily -  with meals and at bedtime.     Jeannett Senior, PA-C 10/17/16 1524    Jeannett Senior, PA-C 10/17/16 1524    Drenda Freeze, MD 10/18/16 [phone]

## 2017-02-12 ENCOUNTER — Other Ambulatory Visit: Payer: Self-pay

## 2017-02-12 ENCOUNTER — Emergency Department (HOSPITAL_BASED_OUTPATIENT_CLINIC_OR_DEPARTMENT_OTHER)
Admission: EM | Admit: 2017-02-12 | Discharge: 2017-02-12 | Disposition: A | Discharge status: Home / Self Care | Payer: Medicare HMO | Attending: Emergency Medicine | Admitting: Emergency Medicine

## 2017-02-12 ENCOUNTER — Encounter (HOSPITAL_BASED_OUTPATIENT_CLINIC_OR_DEPARTMENT_OTHER): Payer: Self-pay | Admitting: Emergency Medicine

## 2017-02-12 DIAGNOSIS — I10 Essential (primary) hypertension: Secondary | ICD-10-CM | POA: Diagnosis not present

## 2017-02-12 DIAGNOSIS — Z79899 Other long term (current) drug therapy: Secondary | ICD-10-CM | POA: Insufficient documentation

## 2017-02-12 DIAGNOSIS — Z794 Long term (current) use of insulin: Secondary | ICD-10-CM | POA: Diagnosis not present

## 2017-02-12 DIAGNOSIS — Z7982 Long term (current) use of aspirin: Secondary | ICD-10-CM | POA: Insufficient documentation

## 2017-02-12 DIAGNOSIS — R300 Dysuria: Secondary | ICD-10-CM | POA: Diagnosis present

## 2017-02-12 DIAGNOSIS — N3001 Acute cystitis with hematuria: Secondary | ICD-10-CM

## 2017-02-12 DIAGNOSIS — E119 Type 2 diabetes mellitus without complications: Secondary | ICD-10-CM | POA: Diagnosis not present

## 2017-02-12 LAB — URINALYSIS, MICROSCOPIC (REFLEX)

## 2017-02-12 LAB — URINALYSIS, ROUTINE W REFLEX MICROSCOPIC

## 2017-02-12 MED ORDER — PHENAZOPYRIDINE HCL 200 MG PO TABS: 200.0000 mg | ORAL_TABLET | Freq: Three times a day (TID) | ORAL | 0 refills | Status: DC | PRN

## 2017-02-12 MED ORDER — CEPHALEXIN 500 MG PO CAPS: 500.0000 mg | ORAL_CAPSULE | Freq: Two times a day (BID) | ORAL | 0 refills | Status: DC

## 2017-02-12 MED ORDER — PHENAZOPYRIDINE HCL 100 MG PO TABS
200.0000 mg | ORAL_TABLET | Freq: Once | ORAL | Status: AC
  Administered 2017-02-12: 200 mg via ORAL
  Filled 2017-02-12: qty 2

## 2017-02-12 MED ORDER — CEPHALEXIN 250 MG PO CAPS
1000.0000 mg | ORAL_CAPSULE | Freq: Once | ORAL | Status: AC
  Administered 2017-02-12: 1000 mg via ORAL
  Filled 2017-02-12: qty 4

## 2017-02-12 NOTE — ED Triage Notes (Signed)
Pink urine and urinary frequency that started tonight.  Painful urination and urinary urgency and frequency.  Suprapubic pain.

## 2017-02-12 NOTE — ED Provider Notes (Signed)
[facility] Provider Note: Georgena Spurling, MD, FACEP  CSN: [id number] MRN: [id number] ARRIVAL: 02/12/17 at [place]: [place]  Dysuria   HISTORY OF PRESENT ILLNESS  02/12/17 3:22 AM Jac[facility] [name] is a 67 y.o. female with a 2-day history of gross hematuria.  Since yesterday evening she has had discomfort in her bladder, urinary urgency, urinary frequency, voiding small amounts and pain with urination.  She rates her pain as a 5 out of 10 at rest and a 10 out of 10 when urinating.  She denies fever or chills.  She denies nausea or vomiting.  She denies flank pain or back pain.   Past Medical History:  Diagnosis Date  . Diabetes mellitus   . HLD (hyperlipidemia)   . Hypertension   . Nephrolithiasis   . Obese     Past Surgical History:  Procedure Laterality Date  . ROTATOR CUFF REPAIR  2009  . TUBAL LIGATION  1979    Family History  Problem Relation Age of Onset  . Diabetes Father   . Heart attack Father 72  . Heart disease Mother        hx cabg at 78  . Heart attack Brother     Social History   Tobacco Use  . Smoking status: Never Smoker  . Smokeless tobacco: Never Used  Substance Use Topics  . Alcohol use: No  . Drug use: No    Prior to Admission medications   Medication Sig Start Date End Date Taking? Authorizing Provider  amLODipine (NORVASC) 5 MG tablet Take 5 mg by mouth daily.  10/31/13   [provider]  aspirin 81 MG tablet Take 81 mg by mouth daily.    [provider]  atorvastatin (LIPITOR) 10 MG tablet Take 10 mg by mouth daily at 6 PM.  10/31/13   [provider]  baclofen (LIORESAL) 10 MG tablet Take 1 tablet (10 mg total) by mouth 3 (three) times daily. 10/17/16   Kirichenko, Tatyana, PA-C  BAYER CONTOUR TEST test strip 1 each by Other route as needed.  11/05/13   [provider]  cyclobenzaprine (FLEXERIL) 5 MG tablet Take 5 mg by mouth at bedtime.  11/24/13   [provider]   famotidine (PEPCID) 40 MG tablet Take 1 tablet (40 mg total) by mouth daily. 10/17/16   Kirichenko, Tatyana, PA-C  glipiZIDE (GLUCOTROL) 10 MG tablet Take 5 mg by mouth 2 (two) times daily before a meal.    [provider]  LANTUS SOLOSTAR 100 UNIT/ML Solostar Pen Inject 15 Units into the skin daily at 10 pm.  11/22/13   [provider]  metFORMIN (GLUCOPHAGE) 1000 MG tablet Take 500 mg by mouth 2 (two) times daily with a meal.    [provider]  NITROSTAT 0.4 MG SL tablet Place 0.4 mg under the tongue every 5 (five) minutes as needed.  11/24/13   [provider]  sucralfate (CARAFATE) 1 g tablet Take 1 tablet (1 g total) by mouth 4 (four) times daily -  with meals and at bedtime. 10/17/16   Kirichenko, Lahoma Rocker, PA-C    Allergies Augmentin [amoxicillin-pot clavulanate]; Definity [perflutren lipid microsphere]; and Novocain [procaine]   REVIEW OF SYSTEMS  Negative except as noted here or in the History of Present Illness.   PHYSICAL EXAMINATION  Initial Vital Signs Blood pressure (!) 168/73, pulse 70, temperature 98.1 F (36.7 C), temperature source Oral, resp. rate 16, height 5\' 5"  (1.651 m),  weight 90.7 kg (200 lb), SpO2 98 %.  Examination General: Well-developed, well-nourished female in no acute distress; appearance consistent with age of record HENT: normocephalic; atraumatic Eyes: pupils equal, round and reactive to light; extraocular muscles intact Neck: supple Heart: regular rate and rhythm Lungs: clear to auscultation bilaterally Abdomen: soft; nondistended; minimal suprapubic tenderness; bowel sounds present Extremities: No deformity; full range of motion; pulses normal Neurologic: Awake, alert and oriented; motor function intact in all extremities and symmetric; no facial droop Skin: Warm and dry Psychiatric: Flat affect   RESULTS  Summary of this visit's results, reviewed by myself:   EKG Interpretation  Date/Time:    Ventricular  Rate:    PR Interval:    QRS Duration:   QT Interval:    QTC Calculation:   R Axis:     Text Interpretation:        Laboratory Studies: Results for orders placed or performed during the hospital encounter of 02/12/17 (from the past 24 hour(s))  Urinalysis, Routine w reflex microscopic- may I&O cath if menses     Status: Abnormal   Collection Time: 02/12/17  2:29 AM  Result Value Ref Range   Color, Urine RED (A) YELLOW   APPearance TURBID (A) CLEAR   Specific Gravity, Urine  1.005 - 1.030    TEST NOT REPORTED DUE TO COLOR INTERFERENCE OF URINE PIGMENT   pH  5.0 - 8.0    TEST NOT REPORTED DUE TO COLOR INTERFERENCE OF URINE PIGMENT   Glucose, UA (A) NEGATIVE mg/dL    TEST NOT REPORTED DUE TO COLOR INTERFERENCE OF URINE PIGMENT   Hgb urine dipstick (A) NEGATIVE    TEST NOT REPORTED DUE TO COLOR INTERFERENCE OF URINE PIGMENT   Bilirubin Urine (A) NEGATIVE    TEST NOT REPORTED DUE TO COLOR INTERFERENCE OF URINE PIGMENT   Ketones, ur (A) NEGATIVE mg/dL    TEST NOT REPORTED DUE TO COLOR INTERFERENCE OF URINE PIGMENT   Protein, ur (A) NEGATIVE mg/dL    TEST NOT REPORTED DUE TO COLOR INTERFERENCE OF URINE PIGMENT   Nitrite (A) NEGATIVE    TEST NOT REPORTED DUE TO COLOR INTERFERENCE OF URINE PIGMENT   Leukocytes, UA (A) NEGATIVE    TEST NOT REPORTED DUE TO COLOR INTERFERENCE OF URINE PIGMENT  Urinalysis, Microscopic (reflex)     Status: Abnormal   Collection Time: 02/12/17  2:29 AM  Result Value Ref Range   RBC / HPF TOO NUMEROUS TO COUNT 0 - 5 RBC/hpf   WBC, UA 6-30 0 - 5 WBC/hpf   Bacteria, UA MANY (A) NONE SEEN   Squamous Epithelial / LPF 0-5 (A) NONE SEEN   Imaging Studies: No results found.  ED COURSE  Nursing notes and initial vitals signs, including pulse oximetry, reviewed.  Vitals:   02/12/17 0226  BP: (!) 168/73  Pulse: 70  Resp: 16  Temp: 98.1 F (36.7 C)  TempSrc: Oral  SpO2: 98%  Weight: 90.7 kg (200 lb)  Height: 5\' 5"  (1.651 m)    PROCEDURES    ED  DIAGNOSES     ICD-10-CM   1. Acute cystitis with hematuria N30.01        Markeith Jue, Jenny Reichmann, MD 02/12/17 [phone]

## 2017-02-14 LAB — URINE CULTURE

## 2017-02-15 ENCOUNTER — Telehealth: Payer: Self-pay

## 2017-02-15 NOTE — Telephone Encounter (Signed)
Post ED Visit - Positive Culture Follow-up  Culture report reviewed by antimicrobial stewardship pharmacist:  []  Elenor Quinones, Pharm.D. []  Heide Guile, Pharm.D., BCPS AQ-ID []  Parks Neptune, Pharm.D., BCPS []  Alycia Rossetti, Pharm.D., BCPS []  [place], Pharm.D., BCPS, AAHIVP []  Legrand Como, Pharm.D., BCPS, AAHIVP []  Salome Arnt, PharmD, BCPS []  Jalene Mullet, PharmD []  Vincenza Hews, PharmD, BCPS Georga Bora Pharm D Positive urine culture Treated with Cephalexin, organism sensitive to the same and no further patient follow-up is required at this time.  Genia Del 02/15/2017, 9:53 AM

## 2017-02-19 NOTE — ED Notes (Signed)
Pt. Called and wanted results of her Urine Culture.  Results reviewed with pt.

## 2018-02-10 DIAGNOSIS — G459 Transient cerebral ischemic attack, unspecified: Secondary | ICD-10-CM

## 2018-02-10 HISTORY — DX: Transient cerebral ischemic attack, unspecified: G45.9

## 2018-07-13 ENCOUNTER — Observation Stay (HOSPITAL_BASED_OUTPATIENT_CLINIC_OR_DEPARTMENT_OTHER)
Admission: EM | Admit: 2018-07-13 | Discharge: 2018-07-14 | Disposition: A | Discharge status: Home / Self Care | Payer: Medicare HMO | Attending: Internal Medicine | Admitting: Internal Medicine

## 2018-07-13 ENCOUNTER — Emergency Department (HOSPITAL_BASED_OUTPATIENT_CLINIC_OR_DEPARTMENT_OTHER): Payer: Medicare HMO

## 2018-07-13 ENCOUNTER — Other Ambulatory Visit: Payer: Self-pay

## 2018-07-13 ENCOUNTER — Encounter (HOSPITAL_BASED_OUTPATIENT_CLINIC_OR_DEPARTMENT_OTHER): Payer: Self-pay | Admitting: *Deleted

## 2018-07-13 DIAGNOSIS — Z794 Long term (current) use of insulin: Secondary | ICD-10-CM | POA: Insufficient documentation

## 2018-07-13 DIAGNOSIS — E119 Type 2 diabetes mellitus without complications: Secondary | ICD-10-CM | POA: Diagnosis not present

## 2018-07-13 DIAGNOSIS — Z7902 Long term (current) use of antithrombotics/antiplatelets: Secondary | ICD-10-CM | POA: Diagnosis not present

## 2018-07-13 DIAGNOSIS — M6281 Muscle weakness (generalized): Secondary | ICD-10-CM | POA: Diagnosis not present

## 2018-07-13 DIAGNOSIS — Z880 Allergy status to penicillin: Secondary | ICD-10-CM | POA: Insufficient documentation

## 2018-07-13 DIAGNOSIS — I6782 Cerebral ischemia: Secondary | ICD-10-CM | POA: Insufficient documentation

## 2018-07-13 DIAGNOSIS — G939 Disorder of brain, unspecified: Secondary | ICD-10-CM | POA: Diagnosis not present

## 2018-07-13 DIAGNOSIS — I10 Essential (primary) hypertension: Secondary | ICD-10-CM | POA: Insufficient documentation

## 2018-07-13 DIAGNOSIS — Z79899 Other long term (current) drug therapy: Secondary | ICD-10-CM | POA: Diagnosis not present

## 2018-07-13 DIAGNOSIS — G459 Transient cerebral ischemic attack, unspecified: Principal | ICD-10-CM | POA: Insufficient documentation

## 2018-07-13 DIAGNOSIS — E1165 Type 2 diabetes mellitus with hyperglycemia: Secondary | ICD-10-CM | POA: Insufficient documentation

## 2018-07-13 DIAGNOSIS — Z7982 Long term (current) use of aspirin: Secondary | ICD-10-CM | POA: Insufficient documentation

## 2018-07-13 DIAGNOSIS — Z881 Allergy status to other antibiotic agents status: Secondary | ICD-10-CM | POA: Insufficient documentation

## 2018-07-13 DIAGNOSIS — R2689 Other abnormalities of gait and mobility: Secondary | ICD-10-CM | POA: Insufficient documentation

## 2018-07-13 DIAGNOSIS — Z6832 Body mass index (BMI) 32.0-32.9, adult: Secondary | ICD-10-CM | POA: Insufficient documentation

## 2018-07-13 DIAGNOSIS — R471 Dysarthria and anarthria: Secondary | ICD-10-CM | POA: Insufficient documentation

## 2018-07-13 DIAGNOSIS — Z9851 Tubal ligation status: Secondary | ICD-10-CM | POA: Insufficient documentation

## 2018-07-13 DIAGNOSIS — Z833 Family history of diabetes mellitus: Secondary | ICD-10-CM | POA: Diagnosis not present

## 2018-07-13 DIAGNOSIS — Z8249 Family history of ischemic heart disease and other diseases of the circulatory system: Secondary | ICD-10-CM | POA: Diagnosis not present

## 2018-07-13 DIAGNOSIS — Z1159 Encounter for screening for other viral diseases: Secondary | ICD-10-CM | POA: Insufficient documentation

## 2018-07-13 DIAGNOSIS — R531 Weakness: Secondary | ICD-10-CM

## 2018-07-13 DIAGNOSIS — E785 Hyperlipidemia, unspecified: Secondary | ICD-10-CM | POA: Diagnosis not present

## 2018-07-13 LAB — PHOSPHORUS: Phosphorus: 3.8 mg/dL (ref 2.5–4.6)

## 2018-07-13 LAB — RAPID URINE DRUG SCREEN, HOSP PERFORMED
Amphetamines: NOT DETECTED
Barbiturates: NOT DETECTED
Benzodiazepines: NOT DETECTED
Cocaine: NOT DETECTED
Opiates: NOT DETECTED
Tetrahydrocannabinol: NOT DETECTED

## 2018-07-13 LAB — PROTIME-INR
INR: 0.9 (ref 0.8–1.2)
Prothrombin Time: 12.2 seconds (ref 11.4–15.2)

## 2018-07-13 LAB — HEMOGLOBIN A1C
Hgb A1c MFr Bld: 11.2 % — ABNORMAL HIGH (ref 4.8–5.6)
Mean Plasma Glucose: 274.74 mg/dL

## 2018-07-13 LAB — COMPREHENSIVE METABOLIC PANEL
ALT: 20 U/L (ref 0–44)
AST: 20 U/L (ref 15–41)
Albumin: 4.2 g/dL (ref 3.5–5.0)
Alkaline Phosphatase: 77 U/L (ref 38–126)
Anion gap: 10 (ref 5–15)
BUN: 20 mg/dL (ref 8–23)
CO2: 23 mmol/L (ref 22–32)
Calcium: 9.3 mg/dL (ref 8.9–10.3)
Chloride: 103 mmol/L (ref 98–111)
Creatinine, Ser: 0.75 mg/dL (ref 0.44–1.00)
GFR calc Af Amer: >60 mL/min (ref >60)
GFR calc non Af Amer: >60 mL/min (ref >60)
Glucose, Bld: 253 mg/dL — ABNORMAL HIGH (ref 70–99)
Potassium: 3.9 mmol/L (ref 3.5–5.1)
Sodium: 136 mmol/L (ref 135–145)
Total Bilirubin: 0.8 mg/dL (ref 0.3–1.2)
Total Protein: 7.1 g/dL (ref 6.5–8.1)

## 2018-07-13 LAB — URINALYSIS, MICROSCOPIC (REFLEX): RBC / HPF: NONE SEEN RBC/hpf (ref 0–5)

## 2018-07-13 LAB — CBC
HCT: 43.8 % (ref 36.0–46.0)
Hemoglobin: 14.2 g/dL (ref 12.0–15.0)
MCH: 28.6 pg (ref 26.0–34.0)
MCHC: 32.4 g/dL (ref 30.0–36.0)
MCV: 88.1 fL (ref 80.0–100.0)
Platelets: 189 10*3/uL (ref 150–400)
RBC: 4.97 MIL/uL (ref 3.87–5.11)
RDW: 11.9 % (ref 11.5–15.5)
WBC: 6.4 10*3/uL (ref 4.0–10.5)
nRBC: 0 % (ref 0.0–0.2)

## 2018-07-13 LAB — APTT: aPTT: 25 seconds (ref 24–36)

## 2018-07-13 LAB — DIFFERENTIAL
Abs Immature Granulocytes: 0.03 10*3/uL (ref 0.00–0.07)
Basophils Absolute: 0 10*3/uL (ref 0.0–0.1)
Basophils Relative: 0 %
Eosinophils Absolute: 0.1 10*3/uL (ref 0.0–0.5)
Eosinophils Relative: 2 %
Immature Granulocytes: 1 %
Lymphocytes Relative: 27 %
Lymphs Abs: 1.7 10*3/uL (ref 0.7–4.0)
Monocytes Absolute: 0.5 10*3/uL (ref 0.1–1.0)
Monocytes Relative: 8 %
Neutro Abs: 4 10*3/uL (ref 1.7–7.7)
Neutrophils Relative %: 62 %

## 2018-07-13 LAB — MAGNESIUM: Magnesium: 1.7 mg/dL (ref 1.7–2.4)

## 2018-07-13 LAB — URINALYSIS, ROUTINE W REFLEX MICROSCOPIC
Bilirubin Urine: NEGATIVE
Glucose, UA: >=500 mg/dL — AB
Hgb urine dipstick: NEGATIVE
Ketones, ur: NEGATIVE mg/dL
Nitrite: NEGATIVE
Protein, ur: NEGATIVE mg/dL
Specific Gravity, Urine: 1.01 (ref 1.005–1.030)
pH: 6 (ref 5.0–8.0)

## 2018-07-13 LAB — GLUCOSE, CAPILLARY: Glucose-Capillary: 154 mg/dL — ABNORMAL HIGH (ref 70–99)

## 2018-07-13 LAB — SARS CORONAVIRUS 2 AG (30 MIN TAT): SARS Coronavirus 2 Ag: NEGATIVE

## 2018-07-13 LAB — ETHANOL: Alcohol, Ethyl (B): <10 mg/dL (ref <10)

## 2018-07-13 LAB — CK: Total CK: 72 U/L (ref 38–234)

## 2018-07-13 MED ORDER — ACETAMINOPHEN 160 MG/5ML PO SOLN: 650.0000 mg | ORAL | Status: DC | PRN

## 2018-07-13 MED ORDER — ACETAMINOPHEN 325 MG PO TABS
650.0000 mg | ORAL_TABLET | ORAL | Status: DC | PRN
  Administered 2018-07-14 (×2): 650 mg via ORAL
  Filled 2018-07-13 (×2): qty 2

## 2018-07-13 MED ORDER — ACETAMINOPHEN 650 MG RE SUPP: 650.0000 mg | RECTAL | Status: DC | PRN

## 2018-07-13 MED ORDER — CLOPIDOGREL BISULFATE 300 MG PO TABS
300.0000 mg | ORAL_TABLET | Freq: Once | ORAL | Status: AC
  Administered 2018-07-13: 300 mg via ORAL
  Filled 2018-07-13: qty 1

## 2018-07-13 MED ORDER — CLOPIDOGREL BISULFATE 75 MG PO TABS
75.0000 mg | ORAL_TABLET | Freq: Every day | ORAL | Status: DC
  Administered 2018-07-14: 75 mg via ORAL
  Filled 2018-07-13: qty 1

## 2018-07-13 MED ORDER — ASPIRIN 325 MG PO TABS
325.0000 mg | ORAL_TABLET | Freq: Every day | ORAL | Status: DC
  Administered 2018-07-13: 325 mg via ORAL
  Filled 2018-07-13: qty 1

## 2018-07-13 MED ORDER — INSULIN ASPART 100 UNIT/ML ~~LOC~~ SOLN
0.0000 [IU] | Freq: Three times a day (TID) | SUBCUTANEOUS | Status: DC
  Administered 2018-07-14 (×2): 5 [IU] via SUBCUTANEOUS

## 2018-07-13 MED ORDER — SENNOSIDES-DOCUSATE SODIUM 8.6-50 MG PO TABS: 1.0000 | ORAL_TABLET | Freq: Every evening | ORAL | Status: DC | PRN

## 2018-07-13 MED ORDER — STROKE: EARLY STAGES OF RECOVERY BOOK
Freq: Once | Status: AC
  Administered 2018-07-13: 22:00:00
  Filled 2018-07-13: qty 1

## 2018-07-13 MED ORDER — HEPARIN SODIUM (PORCINE) 5000 UNIT/ML IJ SOLN
5000.0000 [IU] | Freq: Three times a day (TID) | INTRAMUSCULAR | Status: DC
  Administered 2018-07-13 – 2018-07-14 (×3): 5000 [IU] via SUBCUTANEOUS
  Filled 2018-07-13 (×3): qty 1

## 2018-07-13 NOTE — ED Provider Notes (Signed)
[facility] EMERGENCY DEPARTMENT Provider Note   CSN: [id number] Arrival date & time: 07/13/18  1340    History   Chief Complaint Chief Complaint  Patient presents with  . Numbness    HPI Jacqueline [name] is a 68 y.o. female.  She has a history of hypertension and diabetes.  She says for the last 3 days she has had intermittent numbness and tingling in the left arm sometimes also in the left face.  Today while sitting she experienced the symptoms again but more dense and it was associated with left arm weakness.  She said she could not raise her arm up.  This started at 1 PM.  Her symptoms were improving upon arrival here.  She said she has had the tingling before but never the heaviness.  No double vision blurry vision no balance issues.  No difficulty speaking.  She said she took an aspirin.     The history is provided by the patient.  Cerebrovascular Accident  This is a new problem. The current episode started 1 to 2 hours ago. The problem has been rapidly improving. Pertinent negatives include no chest pain, no abdominal pain, no headaches and no shortness of breath. Nothing aggravates the symptoms. Nothing relieves the symptoms. She has tried ASA for the symptoms. The treatment provided mild relief.    Past Medical History:  Diagnosis Date  . Diabetes mellitus   . HLD (hyperlipidemia)   . Hypertension   . Nephrolithiasis   . Obese     Patient Active Problem List   Diagnosis Date Noted  . Morbid obesity ([place]) 11/25/2013  . HLD (hyperlipidemia) 11/25/2013  . DM type 2 (diabetes mellitus, type 2) ([place]) 11/25/2013  . Chest pain 11/04/2012    Past Surgical History:  Procedure Laterality Date  . ROTATOR CUFF REPAIR  2009  . TUBAL LIGATION  1979     OB History   No obstetric history on file.      Home Medications    Prior to Admission medications   Medication Sig Start Date End Date Taking? Authorizing Provider  glipiZIDE (GLUCOTROL) 10 MG tablet Take 5  mg by mouth 2 (two) times daily before a meal.   Yes [provider]  metFORMIN (GLUCOPHAGE) 1000 MG tablet Take 500 mg by mouth 2 (two) times daily with a meal.   Yes [provider]  amLODipine (NORVASC) 5 MG tablet Take 5 mg by mouth daily.  10/31/13   [provider]  aspirin 81 MG tablet Take 81 mg by mouth daily.    [provider]  atorvastatin (LIPITOR) 10 MG tablet Take 10 mg by mouth daily at 6 PM.  10/31/13   [provider]  baclofen (LIORESAL) 10 MG tablet Take 1 tablet (10 mg total) by mouth 3 (three) times daily. 10/17/16   Kirichenko, Tatyana, PA-C  BAYER CONTOUR TEST test strip 1 each by Other route as needed.  11/05/13   [provider]  cephALEXin (KEFLEX) 500 MG capsule Take 1 capsule (500 mg total) by mouth 2 (two) times daily. 02/12/17   Molpus, John, MD  cyclobenzaprine (FLEXERIL) 5 MG tablet Take 5 mg by mouth at bedtime.  11/24/13   [provider]  famotidine (PEPCID) 40 MG tablet Take 1 tablet (40 mg total) by mouth daily. 10/17/16   Kirichenko, Tatyana, PA-C  LANTUS SOLOSTAR 100 UNIT/ML Solostar Pen Inject 15 Units into the skin daily at 10 pm.  11/22/13   [provider]  NITROSTAT 0.4 MG SL tablet Place 0.4 mg under the tongue every 5 (five) minutes as needed.  11/24/13   [provider]  phenazopyridine (PYRIDIUM) 200 MG tablet Take 1 tablet (200 mg total) by mouth 3 (three) times daily as needed for pain. 02/12/17   Molpus, John, MD  sucralfate (CARAFATE) 1 g tablet Take 1 tablet (1 g total) by mouth 4 (four) times daily -  with meals and at bedtime. 10/17/16   Jeannett Senior, PA-C    Family History Family History  Problem Relation Age of Onset  . Diabetes Father   . Heart attack Father 47  . Heart disease Mother        hx cabg at 44  . Heart attack Brother     Social History Social History   Tobacco Use  . Smoking status: Never Smoker  . Smokeless tobacco: Never Used  Substance  Use Topics  . Alcohol use: No  . Drug use: No     Allergies   Augmentin [amoxicillin-pot clavulanate]; Definity [perflutren lipid microsphere]; and Novocain [procaine]   Review of Systems Review of Systems  Constitutional: Negative for fever.  HENT: Negative for sore throat.   Eyes: Negative for visual disturbance.  Respiratory: Negative for shortness of breath.   Cardiovascular: Negative for chest pain.  Gastrointestinal: Negative for abdominal pain.  Genitourinary: Negative for dysuria.  Musculoskeletal: Negative for neck pain.  Skin: Negative for rash.  Neurological: Positive for weakness and numbness. Negative for facial asymmetry and headaches.     Physical Exam Updated Vital Signs BP 134/71   Pulse 71   Temp 98.4 F (36.9 C) (Oral)   Resp (!) 22   Ht 5\' 5"  (1.651 m)   Wt 89.8 kg   SpO2 95%   BMI 32.95 kg/m   Physical Exam Vitals signs and nursing note reviewed.  Constitutional:      General: She is not in acute distress.    Appearance: She is well-developed.  HENT:     Head: Normocephalic and atraumatic.  Eyes:     Conjunctiva/sclera: Conjunctivae normal.  Neck:     Musculoskeletal: Neck supple.  Cardiovascular:     Rate and Rhythm: Normal rate and regular rhythm.     Heart sounds: No murmur.  Pulmonary:     Effort: Pulmonary effort is normal. No respiratory distress.     Breath sounds: Normal breath sounds.  Abdominal:     Palpations: Abdomen is soft.     Tenderness: There is no abdominal tenderness.  Musculoskeletal: Normal range of motion.        General: No deformity or signs of injury.  Skin:    General: Skin is warm and dry.     Capillary Refill: Capillary refill takes less than 2 seconds.  Neurological:     Mental Status: She is alert and oriented to person, place, and time.     Cranial Nerves: No cranial nerve deficit.     Sensory: Sensory deficit present.     Motor: No weakness.     Comments: She is awake and alert.  There is no  obvious facial asymmetry.  She is got normal upper and lower extremity strength.  She is good subjective decrease sensation in her left arm.  No pronator drift.      ED Treatments / Results  Labs (all labs ordered are listed, but only abnormal results are displayed) Labs Reviewed  COMPREHENSIVE METABOLIC PANEL - Abnormal; Notable for the following components:  Result Value   Glucose, Bld 253 (*)    All other components within normal limits  URINALYSIS, ROUTINE W REFLEX MICROSCOPIC - Abnormal; Notable for the following components:   Glucose, UA >=500 (*)    Leukocytes,Ua SMALL (*)    All other components within normal limits  URINALYSIS, MICROSCOPIC (REFLEX) - Abnormal; Notable for the following components:   Bacteria, UA RARE (*)    All other components within normal limits  SARS CORONAVIRUS 2 (HOSP ORDER, PERFORMED IN [facility] VIA ABBOTT ID)  ETHANOL  PROTIME-INR  APTT  CBC  DIFFERENTIAL  RAPID URINE DRUG SCREEN, HOSP PERFORMED  MAGNESIUM  PHOSPHORUS    EKG EKG Interpretation  Date/Time:  Tuesday July 13 2018 13:49:11 EDT Ventricular Rate:  79 PR Interval:    QRS Duration: 87 QT Interval:  380 QTC Calculation: 436 R Axis:   -11 Text Interpretation:  Sinus rhythm Low voltage, precordial leads Consider anterior infarct similar to prior 9/18 Confirmed by Aletta Edouard [phone]) on 07/13/2018 2:12:21 PM   Radiology Ct Head Code Stroke Wo Contrast  Addendum Date: 07/13/2018   ADDENDUM REPORT: 07/13/2018 14:24 ADDENDUM: Study discussed by telephone with Dr. Aletta Edouard on 07/13/2018 at 1421 hours. Electronically Signed   By: Genevie Ann M.D.   On: 07/13/2018 14:24   Result Date: 07/13/2018 CLINICAL DATA:  Code stroke. 68 year old female with numbness in the left face and arm for 3 days with associated arm heaviness today. EXAM: CT HEAD WITHOUT CONTRAST TECHNIQUE: Contiguous axial images were obtained from the base of the skull through the vertex without intravenous  contrast. COMPARISON:  None. FINDINGS: Brain: No midline shift, mass effect, or evidence of intracranial mass lesion. No ventriculomegaly. No acute intracranial hemorrhage identified. Patchy white matter hypodensity in right centrum semiovale (series 2, image 21). No superimposed cortically based acute infarct identified. And gray-white matter differentiation elsewhere is within normal limits. No cortical encephalomalacia identified. Vascular: Mild Calcified atherosclerosis at the skull base. No suspicious intracranial vascular hyperdensity. Skull: Negative. Sinuses/Orbits: Visualized paranasal sinuses and mastoids are clear. Other: Visualized orbits and scalp soft tissues are within normal limits. ASPECTS [facility] Stroke Program Early CT Score) - Ganglionic level infarction (caudate, lentiform nuclei, internal capsule, insula, M1-M3 cortex): 7 - Supraganglionic infarction (M4-M6 cortex): 3 Total score (0-10 with 10 being normal): 10 IMPRESSION: 1. Age indeterminate white matter small vessel disease type changes in the right centrum semiovale. 2. No superimposed acute cortically based infarct or acute intracranial hemorrhage identified. 3. ASPECTS is 10. Electronically Signed: By: Genevie Ann M.D. On: 07/13/2018 14:16    Procedures Procedures (including critical care time)  Medications Ordered in ED Medications  clopidogrel (PLAVIX) tablet 75 mg (has no administration in time range)  clopidogrel (PLAVIX) tablet 300 mg (300 mg Oral Given 07/13/18 1509)     Initial Impression / Assessment and Plan / ED Course  I have reviewed the triage vital signs and the nursing notes.  Pertinent labs & imaging results that were available during my care of the patient were reviewed by me and considered in my medical decision making (see chart for details).  Clinical Course as of Jul 13 1706  Tue Jul 13, 2018  1421 Reviewed patient's CAT scan independently and with radiology.  They are calling some white matter changes  in the right hemisphere that possibly is a cause of her symptoms.   [MB]  1421 Differential diagnosis includes stroke, TIA, radiculopathy   [MB]  6578 Discussed with tele-neurologist.  They do  not feel she is a lytic candidate.  They feel she possibly had a TIA and she should get Plavix and be admitted for further work-up.   [MB]  7939 I reviewed the plan with the patient and she is agreeable for consideration for admission to [facility] for continued neurologic work-up.   [MB]  1510 Discussed with Dr. Olevia Bowens hospitalist at [facility] who accepts the patient in transfer.   [MB]    Clinical Course User Index [MB] Hayden Rasmussen, MD        Final Clinical Impressions(s) / ED Diagnoses   Final diagnoses:  TIA (transient ischemic attack)    ED Discharge Orders    None       Hayden Rasmussen, MD 07/13/18 [phone]

## 2018-07-13 NOTE — ED Notes (Signed)
Mr. Jacqueline [name] is made aware of the room assignment and the phone number directly to the nurse's station.

## 2018-07-13 NOTE — Consult Note (Signed)
[facility] TeleNeurology Consult Services  Stat Consult  Date of Service:   07/13/2018 13:58:18  Impression:     .  Transient Ischemic Attack  Comments/Sign-Out: Symptoms suspicious for transient ischemic attack, consider dual antiplatelet therapy for 3 weeks then plavix monotherapy. Would benefit from inpt tia eval  CT HEAD: Showed No Acute Hemorrhage or Acute Core Infarct  Metrics: [facility] Notification Time: 07/13/2018 13:57:35 Stamp Time: 07/13/2018 13:58:18 Callback Response Time: 07/13/2018 14:00:30 Video Start Time: 07/13/2018 14:25:01  Our recommendations are outlined below.  Recommendations:     .  Dual antiplatelet Therapy Recommended, would load with 300 mg plavix given stuttering symptoms suspicious for small vessel disease. Then would use asa 81 mg qd and plavix 75 mg qd for 3 weeks, then plavix monotherapy unless inpt workup indicated need for other tx regimn     .  tele  Imaging Studies:     .  MRI Head     .  Carotid Dopplers     .  Echocardiogram - Transthoracic Echocardiogram  Disposition: Neurology Follow Up Recommended  Sign Out:     .  Discussed with Emergency Department Provider  ----------------------------------------------------------------------------------------------------  Chief Complaint: numbness  History of Present Illness: Patient is a 68 year old Female.  At beginning of interview she reports feeling almost normal,  At end of interview she felt that she had some mild  left facial numbness that increased slightly. Currently she feels normal. Today around noon, the left arm felt heavy and had some slurred speech and numbness of the left face. Symptoms lasted about 15 minutes today. Had similar symptoms 3 days ago. nonsmoker, hx of DM Takes an asa qd  Examination: BP(155/69), Pulse(75), Blood Glucose(253) 1A: Level of Consciousness - Alert; keenly responsive + 0 1B: Ask Month and Age - Both Questions Right + 0 1C: Blink  Eyes & Squeeze Hands - Performs Both Tasks + 0 2: Test Horizontal Extraocular Movements - Normal + 0 3: Test Visual Fields - No Visual Loss + 0 4: Test Facial Palsy (Use Grimace if Obtunded) - Normal symmetry + 0 5A: Test Left Arm Motor Drift - No Drift for 10 Seconds + 0 5B: Test Right Arm Motor Drift - No Drift for 10 Seconds + 0 6A: Test Left Leg Motor Drift - No Drift for 5 Seconds + 0 6B: Test Right Leg Motor Drift - No Drift for 5 Seconds + 0 7: Test Limb Ataxia (FNF/Heel-Shin) - No Ataxia + 0 8: Test Sensation - Normal; No sensory loss + 0 9: Test Language/Aphasia - Normal; No aphasia + 0 10: Test Dysarthria - Normal + 0 11: Test Extinction/Inattention - No abnormality + 0  NIHSS Score: 0   Due to the immediate potential for life-threatening deterioration due to underlying acute neurologic illness, I spent 35 minutes providing critical care. This time includes time for face to face visit via telemedicine, review of medical records, imaging studies and discussion of findings with providers, the patient and/or family.   Dr Janean Sark   [facility] [phone]   Case [id number]

## 2018-07-13 NOTE — ED Triage Notes (Signed)
For 3 days off and on she has had numbness and tingling in the left side of her face and left arm. Today she has had the same symptoms but her left arm feels heavy.

## 2018-07-13 NOTE — H&P (Addendum)
[facility] History and Physical  Jacqueline [name] DGL:[id number] DOB: [birth year] DOA: 07/13/2018  Referring physician: Aletta Edouard PCP: Nicola Girt, DO   Chief Complaint: left arm heaviness  HPI: Jacqueline [name] is a 68 y.o. female with medical history significant for hypertension, HLD, history of amaurosis fugax years ago (sudden vision loss of both eyes, reports no work up and no recurrent episodes) obesity, type 2 diabetes on metformin and glipizide.  She presents on 07/13/2018 with 3 days of intermittent left-sided numbness and tingling to face and ipsilateral arm.  She initially attributed it to fatigue related to recent gardening. She noticed some relief with aspirin  She had not been taking aspirin regularly until deciding to take it again about a week ago after finding it in the cabinet.      Around 1 pm while eating lunch with her husband she again noticed the symptoms but this time much more intense with a heaviness in her left arm that felt like lifting " a ton of bricks". She struggled to lift her arm even halfway up. Her husband ( on the phone during my interview) reports she did have some slurred speech at the time. She also noticed her legs felt like "loose jello" while trying to walk. She took aspirin and felt like her symptoms started to improve on the way to the ED  Regarding her diabetes she is only on glipizide and metformin. She is not on insulin.  She has history of hypertension and at one point was on medication but has not been on anything for years. She is unsure of what the medication was called  She has lipitor listed for hyperlipidemia but she has not been taking that for some time either  ED Course:   In the ED she was afebrile, BP 155/69.  CoVID test negative  Lab work notable for negative UDS, UA with greater than 500 glucose, ethanol, INR, CBC, CMP are unremarkable.  CT head showed no acute infarct, age indeterminate small vessel disease present   Telemetry neurology was consulted and upon their evaluation the symptoms were consistent with TIA given quick resolution with only minimal residual symptoms of left-sided face numbness. ecommended loading with Plavix followed by DAPT with aspirin and Plavix x3 weeks.  In addition studies MRI head, carotid Dopplers, TTE.  Patient was transferred from ED [place] center to [facility] for further workup and managemetn   Review of Systems:  Constitutional:  No weight loss, night sweats, Fevers, chills, fatigue.  HEENT:  No headaches, Difficulty swallowing,Tooth/dental problems,Sore throat,  No sneezing, itching, ear ache, nasal congestion, post nasal drip,  Cardio-vascular:  No chest pain, Orthopnea, PND, swelling in lower extremities, anasarca, dizziness, palpitations  GI:  No heartburn, indigestion, abdominal pain, nausea, vomiting, diarrhea, change in bowel habits, loss of appetite  Resp:  No shortness of breath with exertion or at rest. No excess mucus, no productive cough, No non-productive cough, No coughing up of blood.No change in color of mucus.No wheezing.No chest wall deformity  Skin:  no rash or lesions.  GU:  no dysuria, change in color of urine, no urgency or frequency. No flank pain.  Musculoskeletal:  No joint pain or swelling. No decreased range of motion. No back pain.  Psych:  No change in mood or affect. No depression or anxiety. No memory loss.   Past Medical History:  Diagnosis Date  . Diabetes mellitus   . HLD (hyperlipidemia)   . Hypertension   . Nephrolithiasis   .  Obese    Past Surgical History:  Procedure Laterality Date  . ROTATOR CUFF REPAIR  2009  . TUBAL LIGATION  1979   Social History:  reports that she has never smoked. She has never used smokeless tobacco. She reports that she does not drink alcohol or use drugs.  Allergies  Allergen Reactions  . Augmentin [Amoxicillin-Pot Clavulanate]     Altered mental status  . Definity [Perflutren  Lipid Microsphere] Other (See Comments)    Headache and facial flushing after Definity IVP given. No SOB or Chest pain,and Vital signs stable. Irven Baltimore, RN.  Marland Kitchen Novocain [Procaine]     Altered mental status    Family History  Problem Relation Age of Onset  . Diabetes Father   . Heart attack Father 74  . Heart disease Mother        hx cabg at 64  . Heart attack Brother    Prior to Admission medications   Medication Sig Start Date End Date Taking? Authorizing Provider  glipiZIDE (GLUCOTROL) 10 MG tablet Take 5 mg by mouth 2 (two) times daily before a meal.   Yes [provider]  glipiZIDE (GLUCOTROL) 10 MG tablet TAKE 1 TABLET BY MOUTH TWICE A DAY 12/30/17  Yes [provider]  metFORMIN (GLUCOPHAGE) 1000 MG tablet Take 500 mg by mouth 2 (two) times daily with a meal.   Yes [provider]  metFORMIN (GLUCOPHAGE) 1000 MG tablet Take by mouth. 12/30/17  Yes [provider]  aspirin 81 MG tablet Take 81 mg by mouth daily.    [provider]  atorvastatin (LIPITOR) 10 MG tablet Take 10 mg by mouth daily at 6 PM.  10/31/13   [provider]  BAYER CONTOUR TEST test strip 1 each by Other route as needed.  11/05/13   [provider]  cyclobenzaprine (FLEXERIL) 5 MG tablet Take 5 mg by mouth at bedtime.  11/24/13   [provider]  LANTUS SOLOSTAR 100 UNIT/ML Solostar Pen Inject 15 Units into the skin daily at 10 pm.  11/22/13   [provider]  NITROSTAT 0.4 MG SL tablet Place 0.4 mg under the tongue every 5 (five) minutes as needed.  11/24/13   [provider]   Physical Exam: Vitals:   07/13/18 1530 07/13/18 1600 07/13/18 1753 07/13/18 1801  BP: (!) 152/72 134/71 139/72   Pulse: 69 71 66   Resp:   18   Temp:   98.1 F (36.7 C)   TempSrc:   Oral   SpO2: 99% 95% 96%   Weight:    93 kg  Height:    5\' 5"  (1.651 m)    Wt Readings from Last 3 Encounters:  07/13/18 93 kg  02/12/17 90.7 kg   10/17/16 96.6 kg    Constitutional obese female, lying in bed, no distress Eyes: EOMI, anicteric, normal conjunctivae ENMT: Oropharynx with moist mucous membranes, normal dentition Neck: FROM,  Cardiovascular: RRR no MRGs, with no peripheral edema Respiratory: Normal respiratory effort on room air, clear breath sounds  Abdomen: Soft,non-tender,  Musculoskeletal: no clubbing / cyanosis. No joint deformity upper and lower extremities. Good ROM, no contractures. Normal muscle tone. Skin: No rash ulcers, or lesions. Without skin tenting  Neurologic: no facial droop, no slurred speech, following commands, moving all extremities, sensation intact, 5/5 strength in upper and lower extremities Psychiatric:Appropriate affect, and mood. Mental status AAOx3          Labs on Admission:  Basic Metabolic Panel: Recent  Labs  Lab 07/13/18 1357  NA 136  K 3.9  CL 103  CO2 23  GLUCOSE 253*  BUN 20  CREATININE 0.75  CALCIUM 9.3  MG 1.7  PHOS 3.8   Liver Function Tests: Recent Labs  Lab 07/13/18 1357  AST 20  ALT 20  ALKPHOS 77  BILITOT 0.8  PROT 7.1  ALBUMIN 4.2   No results for input(s): LIPASE, AMYLASE in the last 168 hours. No results for input(s): AMMONIA in the last 168 hours. CBC: Recent Labs  Lab 07/13/18 1357  WBC 6.4  NEUTROABS 4.0  HGB 14.2  HCT 43.8  MCV 88.1  PLT 189   Cardiac Enzymes: No results for input(s): CKTOTAL, CKMB, CKMBINDEX, TROPONINI in the last 168 hours.  BNP (last 3 results) No results for input(s): BNP in the last 8760 hours.  ProBNP (last 3 results) No results for input(s): PROBNP in the last 8760 hours.  CBG: No results for input(s): GLUCAP in the last 168 hours.  Radiological Exams on Admission: Ct Head Code Stroke Wo Contrast  Addendum Date: 07/13/2018   ADDENDUM REPORT: 07/13/2018 14:24 ADDENDUM: Study discussed by telephone with Dr. Aletta Edouard on 07/13/2018 at 1421 hours. Electronically Signed   By: Genevie Ann M.D.   On: 07/13/2018  14:24   Result Date: 07/13/2018 CLINICAL DATA:  Code stroke. 68 year old female with numbness in the left face and arm for 3 days with associated arm heaviness today. EXAM: CT HEAD WITHOUT CONTRAST TECHNIQUE: Contiguous axial images were obtained from the base of the skull through the vertex without intravenous contrast. COMPARISON:  None. FINDINGS: Brain: No midline shift, mass effect, or evidence of intracranial mass lesion. No ventriculomegaly. No acute intracranial hemorrhage identified. Patchy white matter hypodensity in right centrum semiovale (series 2, image 21). No superimposed cortically based acute infarct identified. And gray-white matter differentiation elsewhere is within normal limits. No cortical encephalomalacia identified. Vascular: Mild Calcified atherosclerosis at the skull base. No suspicious intracranial vascular hyperdensity. Skull: Negative. Sinuses/Orbits: Visualized paranasal sinuses and mastoids are clear. Other: Visualized orbits and scalp soft tissues are within normal limits. ASPECTS [facility] Stroke Program Early CT Score) - Ganglionic level infarction (caudate, lentiform nuclei, internal capsule, insula, M1-M3 cortex): 7 - Supraganglionic infarction (M4-M6 cortex): 3 Total score (0-10 with 10 being normal): 10 IMPRESSION: 1. Age indeterminate white matter small vessel disease type changes in the right centrum semiovale. 2. No superimposed acute cortically based infarct or acute intracranial hemorrhage identified. 3. ASPECTS is 10. Electronically Signed: By: Genevie Ann M.D. On: 07/13/2018 14:16    EKG: Independently reviewed. normal EKG, normal sinus rhythm.  Assessment/Plan Active Problems:   Morbid obesity ([place])   HLD (hyperlipidemia)   DM type 2 (diabetes mellitus, type 2) ([place])   TIA (transient ischemic attack)   Left-sided weakness   1. Transient left-sided face numbness, left arm weakness.  Symptoms quickly resolved with no evidence of infarct on CT head likely TIA.   Will obtain MRI brain, TTE and carotid Dopplers.  Has multiple risk factors including HTN, HLD, obesity, type 2 diabetes, previous history of amaurosis fugax.  Check lipid panel, A1c, status post Plavix load in ED continue aspirin and Plavix as recommended by telemetry neurology prior to transfer, monitor on telemetry.  PT and OT consulted. Neurology made aware 2. Type 2 diabetes.  History of poor control per chart review, only on metformin and glipizide at home.  Recheck A1c.  Sliding scale as needed. 3. Hypertension.  Blood pressure slightly elevated  with systolics in the 283M.  On no home meds though previously prescribed.  Allow permissive hypertension until acute infarct ruled out. 4. Hyperlipidemia.  Not taking lipitor at home. Check CK before initiating likely high dose given this is likely TIA, follow-up lipid panel 5. Obesity.  BMI 34 encouraged weight loss.     Consultants: Neurology Code Status: FULL Code, discussed on day of admission  DVT Prophylaxis:heparin Family Communication: husband via phone  Disposition Plan: Accepted as inpatient on transfer but will change to observation given resolution of symptoms currently and negative CT. Still warrants observation during further workup with MRI brain given multiple risk factors and concerning symptoms     Desiree Hane MD [facility]  Pager [phone]  If 7PM-7AM, please contact night-coverage [email or web address] Password [facility]  07/13/2018, 6:56 PM

## 2018-07-14 ENCOUNTER — Observation Stay (HOSPITAL_BASED_OUTPATIENT_CLINIC_OR_DEPARTMENT_OTHER): Payer: Medicare HMO

## 2018-07-14 ENCOUNTER — Observation Stay (HOSPITAL_COMMUNITY): Payer: Medicare HMO

## 2018-07-14 ENCOUNTER — Emergency Department (HOSPITAL_COMMUNITY): Payer: Medicare HMO

## 2018-07-14 ENCOUNTER — Encounter (HOSPITAL_COMMUNITY): Payer: Self-pay | Admitting: Emergency Medicine

## 2018-07-14 ENCOUNTER — Encounter (HOSPITAL_COMMUNITY): Payer: Self-pay | Admitting: Radiology

## 2018-07-14 ENCOUNTER — Other Ambulatory Visit: Payer: Self-pay | Admitting: Neurology

## 2018-07-14 ENCOUNTER — Emergency Department (HOSPITAL_BASED_OUTPATIENT_CLINIC_OR_DEPARTMENT_OTHER)
Admission: EM | Admit: 2018-07-14 | Discharge: 2018-07-14 | Disposition: A | Discharge status: Home / Self Care | Payer: Medicare HMO | Source: Home / Self Care | Attending: Emergency Medicine | Admitting: Emergency Medicine

## 2018-07-14 ENCOUNTER — Other Ambulatory Visit: Payer: Self-pay

## 2018-07-14 DIAGNOSIS — R202 Paresthesia of skin: Secondary | ICD-10-CM | POA: Insufficient documentation

## 2018-07-14 DIAGNOSIS — Z79899 Other long term (current) drug therapy: Secondary | ICD-10-CM | POA: Insufficient documentation

## 2018-07-14 DIAGNOSIS — Z794 Long term (current) use of insulin: Secondary | ICD-10-CM | POA: Insufficient documentation

## 2018-07-14 DIAGNOSIS — G43109 Migraine with aura, not intractable, without status migrainosus: Secondary | ICD-10-CM

## 2018-07-14 DIAGNOSIS — Z7902 Long term (current) use of antithrombotics/antiplatelets: Secondary | ICD-10-CM | POA: Insufficient documentation

## 2018-07-14 DIAGNOSIS — E119 Type 2 diabetes mellitus without complications: Secondary | ICD-10-CM | POA: Diagnosis not present

## 2018-07-14 DIAGNOSIS — G459 Transient cerebral ischemic attack, unspecified: Secondary | ICD-10-CM

## 2018-07-14 DIAGNOSIS — Z7982 Long term (current) use of aspirin: Secondary | ICD-10-CM | POA: Insufficient documentation

## 2018-07-14 DIAGNOSIS — I10 Essential (primary) hypertension: Secondary | ICD-10-CM | POA: Insufficient documentation

## 2018-07-14 LAB — ETHANOL: Alcohol, Ethyl (B): <10 mg/dL (ref <10)

## 2018-07-14 LAB — COMPREHENSIVE METABOLIC PANEL
ALT: 22 U/L (ref 0–44)
AST: 18 U/L (ref 15–41)
Albumin: 4.2 g/dL (ref 3.5–5.0)
Alkaline Phosphatase: 87 U/L (ref 38–126)
Anion gap: 11 (ref 5–15)
BUN: 14 mg/dL (ref 8–23)
CO2: 25 mmol/L (ref 22–32)
Calcium: 9.4 mg/dL (ref 8.9–10.3)
Chloride: 102 mmol/L (ref 98–111)
Creatinine, Ser: 0.99 mg/dL (ref 0.44–1.00)
GFR calc Af Amer: >60 mL/min (ref >60)
GFR calc non Af Amer: 58 mL/min — ABNORMAL LOW (ref >60)
Glucose, Bld: 272 mg/dL — ABNORMAL HIGH (ref 70–99)
Potassium: 3.9 mmol/L (ref 3.5–5.1)
Sodium: 138 mmol/L (ref 135–145)
Total Bilirubin: 0.8 mg/dL (ref 0.3–1.2)
Total Protein: 7.1 g/dL (ref 6.5–8.1)

## 2018-07-14 LAB — LIPID PANEL
Cholesterol: 205 mg/dL — ABNORMAL HIGH (ref 0–200)
HDL: 38 mg/dL — ABNORMAL LOW (ref >40)
LDL Cholesterol: 119 mg/dL — ABNORMAL HIGH (ref 0–99)
Total CHOL/HDL Ratio: 5.4 RATIO
Triglycerides: 241 mg/dL — ABNORMAL HIGH (ref <150)
VLDL: 48 mg/dL — ABNORMAL HIGH (ref 0–40)

## 2018-07-14 LAB — CBC
HCT: 46.9 % — ABNORMAL HIGH (ref 36.0–46.0)
Hemoglobin: 15.8 g/dL — ABNORMAL HIGH (ref 12.0–15.0)
MCH: 29.4 pg (ref 26.0–34.0)
MCHC: 33.7 g/dL (ref 30.0–36.0)
MCV: 87.3 fL (ref 80.0–100.0)
Platelets: 232 10*3/uL (ref 150–400)
RBC: 5.37 MIL/uL — ABNORMAL HIGH (ref 3.87–5.11)
RDW: 12.1 % (ref 11.5–15.5)
WBC: 6.7 10*3/uL (ref 4.0–10.5)
nRBC: 0 % (ref 0.0–0.2)

## 2018-07-14 LAB — I-STAT CHEM 8, ED
BUN: 19 mg/dL (ref 8–23)
Calcium, Ion: 1.11 mmol/L — ABNORMAL LOW (ref 1.15–1.40)
Chloride: 101 mmol/L (ref 98–111)
Creatinine, Ser: 0.9 mg/dL (ref 0.44–1.00)
Glucose, Bld: 271 mg/dL — ABNORMAL HIGH (ref 70–99)
HCT: 45 % (ref 36.0–46.0)
Hemoglobin: 15.3 g/dL — ABNORMAL HIGH (ref 12.0–15.0)
Potassium: 4.1 mmol/L (ref 3.5–5.1)
Sodium: 138 mmol/L (ref 135–145)
TCO2: 29 mmol/L (ref 22–32)

## 2018-07-14 LAB — BASIC METABOLIC PANEL
Anion gap: 10 (ref 5–15)
BUN: 14 mg/dL (ref 8–23)
CO2: 27 mmol/L (ref 22–32)
Calcium: 9.4 mg/dL (ref 8.9–10.3)
Chloride: 102 mmol/L (ref 98–111)
Creatinine, Ser: 0.75 mg/dL (ref 0.44–1.00)
GFR calc Af Amer: >60 mL/min (ref >60)
GFR calc non Af Amer: >60 mL/min (ref >60)
Glucose, Bld: 179 mg/dL — ABNORMAL HIGH (ref 70–99)
Potassium: 4.4 mmol/L (ref 3.5–5.1)
Sodium: 139 mmol/L (ref 135–145)

## 2018-07-14 LAB — DIFFERENTIAL
Abs Immature Granulocytes: 0.04 10*3/uL (ref 0.00–0.07)
Basophils Absolute: 0 10*3/uL (ref 0.0–0.1)
Basophils Relative: 0 %
Eosinophils Absolute: 0.2 10*3/uL (ref 0.0–0.5)
Eosinophils Relative: 2 %
Immature Granulocytes: 1 %
Lymphocytes Relative: 30 %
Lymphs Abs: 2 10*3/uL (ref 0.7–4.0)
Monocytes Absolute: 0.6 10*3/uL (ref 0.1–1.0)
Monocytes Relative: 9 %
Neutro Abs: 4 10*3/uL (ref 1.7–7.7)
Neutrophils Relative %: 58 %

## 2018-07-14 LAB — GLUCOSE, CAPILLARY
Glucose-Capillary: 215 mg/dL — ABNORMAL HIGH (ref 70–99)
Glucose-Capillary: 202 mg/dL — ABNORMAL HIGH (ref 70–99)

## 2018-07-14 LAB — ECHOCARDIOGRAM COMPLETE BUBBLE STUDY
Height: 65 in
Weight: 3280.44 oz

## 2018-07-14 LAB — HIV ANTIBODY (ROUTINE TESTING W REFLEX): HIV Screen 4th Generation wRfx: NONREACTIVE

## 2018-07-14 LAB — APTT: aPTT: 27 seconds (ref 24–36)

## 2018-07-14 LAB — PROTIME-INR
INR: 1 (ref 0.8–1.2)
Prothrombin Time: 12.9 seconds (ref 11.4–15.2)

## 2018-07-14 MED ORDER — DIPHENHYDRAMINE HCL 50 MG/ML IJ SOLN
12.5000 mg | Freq: Once | INTRAMUSCULAR | Status: AC
  Administered 2018-07-14: 12.5 mg via INTRAVENOUS
  Filled 2018-07-14: qty 1

## 2018-07-14 MED ORDER — TOPIRAMATE 25 MG PO TABS: ORAL_TABLET | ORAL | 0 refills | Status: DC

## 2018-07-14 MED ORDER — INSULIN PEN NEEDLE 32G X 4 MM MISC: 0 refills | Status: DC

## 2018-07-14 MED ORDER — IOHEXOL 350 MG/ML SOLN
75.0000 mL | Freq: Once | INTRAVENOUS | Status: AC | PRN
  Administered 2018-07-14: 75 mL via INTRAVENOUS

## 2018-07-14 MED ORDER — INSULIN DETEMIR 100 UNIT/ML FLEXPEN: 10.0000 [IU] | PEN_INJECTOR | Freq: Every day | SUBCUTANEOUS | 0 refills | Status: DC

## 2018-07-14 MED ORDER — ATORVASTATIN CALCIUM 10 MG PO TABS: 10.0000 mg | ORAL_TABLET | Freq: Every day | ORAL | Status: DC

## 2018-07-14 MED ORDER — METOCLOPRAMIDE HCL 5 MG/ML IJ SOLN
10.0000 mg | Freq: Once | INTRAMUSCULAR | Status: AC
  Administered 2018-07-14: 10 mg via INTRAVENOUS
  Filled 2018-07-14: qty 2

## 2018-07-14 MED ORDER — ATORVASTATIN CALCIUM 40 MG PO TABS: 40.0000 mg | ORAL_TABLET | Freq: Every day | ORAL | 0 refills | Status: DC

## 2018-07-14 MED ORDER — CLOPIDOGREL BISULFATE 75 MG PO TABS: 75.0000 mg | ORAL_TABLET | Freq: Every day | ORAL | 0 refills | Status: DC

## 2018-07-14 MED ORDER — INSULIN GLARGINE 100 UNIT/ML ~~LOC~~ SOLN
10.0000 [IU] | Freq: Every day | SUBCUTANEOUS | Status: DC
  Administered 2018-07-14: 10 [IU] via SUBCUTANEOUS
  Filled 2018-07-14: qty 0.1

## 2018-07-14 MED ORDER — LIVING WELL WITH DIABETES BOOK
Freq: Once | Status: AC
  Administered 2018-07-14: 13:00:00
  Filled 2018-07-14: qty 1

## 2018-07-14 MED ORDER — ASPIRIN 81 MG PO TBEC: 81.0000 mg | DELAYED_RELEASE_TABLET | Freq: Every day | ORAL | 0 refills | Status: DC

## 2018-07-14 MED ORDER — KETOROLAC TROMETHAMINE 15 MG/ML IJ SOLN
15.0000 mg | Freq: Once | INTRAMUSCULAR | Status: AC
  Administered 2018-07-14: 15 mg via INTRAVENOUS
  Filled 2018-07-14: qty 1

## 2018-07-14 MED ORDER — ATORVASTATIN CALCIUM 40 MG PO TABS
40.0000 mg | ORAL_TABLET | Freq: Every day | ORAL | Status: DC
  Administered 2018-07-14: 40 mg via ORAL
  Filled 2018-07-14: qty 1

## 2018-07-14 MED ORDER — ASPIRIN EC 81 MG PO TBEC
81.0000 mg | DELAYED_RELEASE_TABLET | Freq: Every day | ORAL | Status: DC
  Administered 2018-07-14: 81 mg via ORAL
  Filled 2018-07-14: qty 1

## 2018-07-14 NOTE — TOC Benefit Eligibility Note (Signed)
Transition of Care [facility]) Benefit Eligibility Note    Patient Details  Name: Jacqueline [name] MRN: [id number] Date of Birth: [birth year]   Medication/Dose: LANTUS  10 UNITS  PEN  Covered?: Yes  Tier: 3 Drug  Prescription Coverage Preferred Pharmacy: [facility] with Person/Company/Phone Number:: [employer] # [phone])  Co-Pay: $ 45.00  Prior Approval: No  Deductible: Unmet  Additional Notes: LEVERMIR 10 UNITS PEN (COVER- YES, CO-PAY- $ 45.00, TIER- 3 DRUG. P/A-NO)    Memory Argue Phone Number: 07/14/2018, 2:29 PM

## 2018-07-14 NOTE — Discharge Instructions (Addendum)
Topamax as prescribed.  Follow-up with neurology as previously recommended, and return to the ER if symptoms significantly worsen or change.

## 2018-07-14 NOTE — ED Notes (Signed)
Bad headache for one month

## 2018-07-14 NOTE — ED Triage Notes (Signed)
Pt c/o left sided facial droop and left arm numbness that started tonight at 2030. Pt recently d/c from hospital after a TIA work-up. Per Dr. Regenia Skeeter, Code Stroke called. Pt answering questions appropriately, ambulatory without difficulty.

## 2018-07-14 NOTE — Discharge Summary (Signed)
Physician Discharge Summary  Jacqueline [name] EOF:[id number] DOB: [birth year] DOA: 07/13/2018  PCP: Nicola Girt, DO  Admit date: 07/13/2018 Discharge date: 07/14/2018  Admitted From: home Discharge disposition: home   Recommendations for Outpatient Follow-Up:   1. Needs better blood sugar control 2. ASA plus plavix x 3 weeks then plavix alone   Discharge Diagnosis:   Active Problems:   Morbid obesity ([place])   HLD (hyperlipidemia)   DM type 2 (diabetes mellitus, type 2) ([place])   TIA (transient ischemic attack)   Left-sided weakness    Discharge Condition: Improved.  Diet recommendation: Low sodium, heart healthy.  Carbohydrate-modified.  Wound care: None.  Code status: Full.   History of Present Illness:   Jacqueline [name] is a 68 y.o. female with medical history significant for hypertension, HLD, history of amaurosis fugax years ago (sudden vision loss of both eyes, reports no work up and no recurrent episodes) obesity, type 2 diabetes on metformin and glipizide.  She presents on 07/13/2018 with 3 days of intermittent left-sided numbness and tingling to face and ipsilateral arm.  She initially attributed it to fatigue related to recent gardening. She noticed some relief with aspirin  She had not been taking aspirin regularly until deciding to take it again about a week ago after finding it in the cabinet.      Around 1 pm while eating lunch with her husband she again noticed the symptoms but this time much more intense with a heaviness in her left arm that felt like lifting " a ton of bricks". She struggled to lift her arm even halfway up. Her husband ( on the phone during my interview) reports she did have some slurred speech at the time. She also noticed her legs felt like "loose jello" while trying to walk. She took aspirin and felt like her symptoms started to improve on the way to the ED  Regarding her diabetes she is only on glipizide and metformin. She is not on  insulin.  She has history of hypertension and at one point was on medication but has not been on anything for years. She is unsure of what the medication was called  She has lipitor listed for hyperlipidemia but she has not been taking that for some time either   Hospital Course by Problem:   TIA vs complex migraine -MRI negative -LDL: 119 -HgbA1c: >11-- needs further titration of her insulin-- also encouraged exercise and weight loss Echo:  ejection fraction of 60-65%. The cavity size was normal. Left ventricular diastolic Doppler parameters are consistent with impaired relaxation. No evidence of left ventricular regional wall  motion abnormalities. -ASA/plavix for 3 weeks then plavix alone  Poorly controlled DM type 2- hyperglycemia -resume home meds plus insulin -needs close monitoring and adjustment of medications  HLD -statin -LDL: 119    Medical Consultants:   neurology   Discharge Exam:   Vitals:   07/14/18 0822 07/14/18 1134  BP: (!) 144/75 129/70  Pulse: 72 68  Resp: 16 20  Temp: 98 F (36.7 C) 99.1 F (37.3 C)  SpO2: 98% 92%   Vitals:   07/14/18 0422 07/14/18 0602 07/14/18 0822 07/14/18 1134  BP: (!) 143/80 138/79 (!) 144/75 129/70  Pulse: 70 72 72 68  Resp: 20 20 16 20   Temp: 97.8 F (36.6 C) 97.9 F (36.6 C) 98 F (36.7 C) 99.1 F (37.3 C)  TempSrc: Oral Oral Oral Oral  SpO2: 96% 97% 98% 92%  Weight:  Height:        General exam: Appears calm and comfortable.  The results of significant diagnostics from this hospitalization (including imaging, microbiology, ancillary and laboratory) are listed below for reference.     Procedures and Diagnostic Studies:   Ct Angio Head W Or Wo Contrast  Result Date: 07/14/2018 CLINICAL DATA:  Left-sided numbness with tingling of the face and left arm EXAM: CT ANGIOGRAPHY HEAD AND NECK TECHNIQUE: Multidetector CT imaging of the head and neck was performed using the standard protocol during bolus  administration of intravenous contrast. Multiplanar CT image reconstructions and MIPs were obtained to evaluate the vascular anatomy. Carotid stenosis measurements (when applicable) are obtained utilizing NASCET criteria, using the distal internal carotid diameter as the denominator. CONTRAST:  49mL OMNIPAQUE IOHEXOL 350 MG/ML SOLN COMPARISON:  Head CT 07/13/2018. FINDINGS: CT HEAD FINDINGS Brain: There is no mass, hemorrhage or extra-axial collection. The size and configuration of the ventricles and extra-axial CSF spaces are normal. Unchanged hypodensity of the right centrum semiovale. This appears oriented perpendicularly to the long axis of the corpus callosum. The brain parenchyma is normal. Skull: The visualized skull base, calvarium and extracranial soft tissues are normal. Sinuses/Orbits: No fluid levels or advanced mucosal thickening of the visualized paranasal sinuses. No mastoid or middle ear effusion. The orbits are normal. CTA NECK FINDINGS SKELETON: There is no bony spinal canal stenosis. No lytic or blastic lesion. OTHER NECK: Normal pharynx, larynx and major salivary glands. No cervical lymphadenopathy. Unremarkable thyroid gland. UPPER CHEST: No pneumothorax or pleural effusion. No nodules or masses. AORTIC ARCH: There is no calcific atherosclerosis of the aortic arch. There is no aneurysm, dissection or hemodynamically significant stenosis of the visualized ascending aorta and aortic arch. Conventional 3 vessel aortic branching pattern. The visualized proximal subclavian arteries are widely patent. RIGHT CAROTID SYSTEM: --Common carotid artery: Widely patent origin without common carotid artery dissection or aneurysm. --Internal carotid artery: No dissection, occlusion or aneurysm. There is mixed density atherosclerosis extending into the proximal ICA, resulting in less than 50% stenosis. --External carotid artery: No acute abnormality. LEFT CAROTID SYSTEM: --Common carotid artery: Widely patent  origin without common carotid artery dissection or aneurysm. --Internal carotid artery: No dissection, occlusion or aneurysm. There is mixed density atherosclerosis extending into the proximal ICA, resulting in less than 50% stenosis. --External carotid artery: No acute abnormality. VERTEBRAL ARTERIES: Left dominant configuration. Both origins are clearly patent. No dissection, occlusion or flow-limiting stenosis to the skull base (V1-V3 segments). CTA HEAD FINDINGS POSTERIOR CIRCULATION: --Vertebral arteries: Normal V4 segments. --Posterior inferior cerebellar arteries (PICA): The right PICA and AICA share a common origin. Normal origin of the left PICA from the ipsilateral vertebral artery. --Anterior inferior cerebellar arteries (AICA): Normal origin of the right AICA from the basilar artery. Left AICA not clearly visualized. --Basilar artery: Normal. --Superior cerebellar arteries: Normal. --Posterior cerebral arteries (PCA): Normal. The left PCA is predominantly supplied by the posterior communicating artery. ANTERIOR CIRCULATION: --Intracranial internal carotid arteries: Normal. --Anterior cerebral arteries (ACA): Normal. Both A1 segments are present. Patent anterior communicating artery (a-comm). --Middle cerebral arteries (MCA): Normal. VENOUS SINUSES: As permitted by contrast timing, patent. ANATOMIC VARIANTS: None Review of the MIP images confirms the above findings. IMPRESSION: 1. No intracranial hemorrhage or emergent large vessel occlusion. 2. Unchanged appearance of right frontal white matter lesion, perpendicular to the long axis of the lateral ventricles. While small vessel infarcts could have this appearance, the orientation of this lesion is concerning for demyelination. MRI of the brain with  and without contrast is recommended to assess for multiple sclerosis. 3. Bilateral carotid bifurcation atherosclerosis with less than 50% stenosis. Electronically Signed   By: Ulyses Jarred M.D.   On:  07/14/2018 00:56   Ct Angio Neck W Or Wo Contrast  Result Date: 07/14/2018 CLINICAL DATA:  Left-sided numbness with tingling of the face and left arm EXAM: CT ANGIOGRAPHY HEAD AND NECK TECHNIQUE: Multidetector CT imaging of the head and neck was performed using the standard protocol during bolus administration of intravenous contrast. Multiplanar CT image reconstructions and MIPs were obtained to evaluate the vascular anatomy. Carotid stenosis measurements (when applicable) are obtained utilizing NASCET criteria, using the distal internal carotid diameter as the denominator. CONTRAST:  54mL OMNIPAQUE IOHEXOL 350 MG/ML SOLN COMPARISON:  Head CT 07/13/2018. FINDINGS: CT HEAD FINDINGS Brain: There is no mass, hemorrhage or extra-axial collection. The size and configuration of the ventricles and extra-axial CSF spaces are normal. Unchanged hypodensity of the right centrum semiovale. This appears oriented perpendicularly to the long axis of the corpus callosum. The brain parenchyma is normal. Skull: The visualized skull base, calvarium and extracranial soft tissues are normal. Sinuses/Orbits: No fluid levels or advanced mucosal thickening of the visualized paranasal sinuses. No mastoid or middle ear effusion. The orbits are normal. CTA NECK FINDINGS SKELETON: There is no bony spinal canal stenosis. No lytic or blastic lesion. OTHER NECK: Normal pharynx, larynx and major salivary glands. No cervical lymphadenopathy. Unremarkable thyroid gland. UPPER CHEST: No pneumothorax or pleural effusion. No nodules or masses. AORTIC ARCH: There is no calcific atherosclerosis of the aortic arch. There is no aneurysm, dissection or hemodynamically significant stenosis of the visualized ascending aorta and aortic arch. Conventional 3 vessel aortic branching pattern. The visualized proximal subclavian arteries are widely patent. RIGHT CAROTID SYSTEM: --Common carotid artery: Widely patent origin without common carotid artery  dissection or aneurysm. --Internal carotid artery: No dissection, occlusion or aneurysm. There is mixed density atherosclerosis extending into the proximal ICA, resulting in less than 50% stenosis. --External carotid artery: No acute abnormality. LEFT CAROTID SYSTEM: --Common carotid artery: Widely patent origin without common carotid artery dissection or aneurysm. --Internal carotid artery: No dissection, occlusion or aneurysm. There is mixed density atherosclerosis extending into the proximal ICA, resulting in less than 50% stenosis. --External carotid artery: No acute abnormality. VERTEBRAL ARTERIES: Left dominant configuration. Both origins are clearly patent. No dissection, occlusion or flow-limiting stenosis to the skull base (V1-V3 segments). CTA HEAD FINDINGS POSTERIOR CIRCULATION: --Vertebral arteries: Normal V4 segments. --Posterior inferior cerebellar arteries (PICA): The right PICA and AICA share a common origin. Normal origin of the left PICA from the ipsilateral vertebral artery. --Anterior inferior cerebellar arteries (AICA): Normal origin of the right AICA from the basilar artery. Left AICA not clearly visualized. --Basilar artery: Normal. --Superior cerebellar arteries: Normal. --Posterior cerebral arteries (PCA): Normal. The left PCA is predominantly supplied by the posterior communicating artery. ANTERIOR CIRCULATION: --Intracranial internal carotid arteries: Normal. --Anterior cerebral arteries (ACA): Normal. Both A1 segments are present. Patent anterior communicating artery (a-comm). --Middle cerebral arteries (MCA): Normal. VENOUS SINUSES: As permitted by contrast timing, patent. ANATOMIC VARIANTS: None Review of the MIP images confirms the above findings. IMPRESSION: 1. No intracranial hemorrhage or emergent large vessel occlusion. 2. Unchanged appearance of right frontal white matter lesion, perpendicular to the long axis of the lateral ventricles. While small vessel infarcts could have this  appearance, the orientation of this lesion is concerning for demyelination. MRI of the brain with and without contrast is recommended to assess  for multiple sclerosis. 3. Bilateral carotid bifurcation atherosclerosis with less than 50% stenosis. Electronically Signed   By: Ulyses Jarred M.D.   On: 07/14/2018 00:56   Mr Brain Wo Contrast  Result Date: 07/14/2018 CLINICAL DATA:  TIA. Intermittent left-sided numbness and tingling in the face and arm EXAM: MRI HEAD WITHOUT CONTRAST TECHNIQUE: Multiplanar, multiecho pulse sequences of the brain and surrounding structures were obtained without intravenous contrast. COMPARISON:  CT and CTA from yesterday FINDINGS: Brain: No acute infarction, hemorrhage, hydrocephalus, extra-axial collection or mass lesion. DWI high intensity in the left frontal white matter is shine through based on the other sequences. There are lacunes in the bifrontal white matter, more numerous on the right, attributed to old microvascular ischemic insults given patient's vascular risk factors. Vascular: Major flow voids are preserved Skull and upper cervical spine: Negative for marrow lesion Sinuses/Orbits: Negative IMPRESSION: 1. No emergent finding. 2. Small remote lacunar infarcts in the deep cerebral white matter. Electronically Signed   By: Monte Fantasia M.D.   On: 07/14/2018 09:26   Ct Head Code Stroke Wo Contrast  Addendum Date: 07/13/2018   ADDENDUM REPORT: 07/13/2018 14:24 ADDENDUM: Study discussed by telephone with Dr. Aletta Edouard on 07/13/2018 at 1421 hours. Electronically Signed   By: Genevie Ann M.D.   On: 07/13/2018 14:24   Result Date: 07/13/2018 CLINICAL DATA:  Code stroke. 68 year old female with numbness in the left face and arm for 3 days with associated arm heaviness today. EXAM: CT HEAD WITHOUT CONTRAST TECHNIQUE: Contiguous axial images were obtained from the base of the skull through the vertex without intravenous contrast. COMPARISON:  None. FINDINGS: Brain: No midline  shift, mass effect, or evidence of intracranial mass lesion. No ventriculomegaly. No acute intracranial hemorrhage identified. Patchy white matter hypodensity in right centrum semiovale (series 2, image 21). No superimposed cortically based acute infarct identified. And gray-white matter differentiation elsewhere is within normal limits. No cortical encephalomalacia identified. Vascular: Mild Calcified atherosclerosis at the skull base. No suspicious intracranial vascular hyperdensity. Skull: Negative. Sinuses/Orbits: Visualized paranasal sinuses and mastoids are clear. Other: Visualized orbits and scalp soft tissues are within normal limits. ASPECTS [facility] Stroke Program Early CT Score) - Ganglionic level infarction (caudate, lentiform nuclei, internal capsule, insula, M1-M3 cortex): 7 - Supraganglionic infarction (M4-M6 cortex): 3 Total score (0-10 with 10 being normal): 10 IMPRESSION: 1. Age indeterminate white matter small vessel disease type changes in the right centrum semiovale. 2. No superimposed acute cortically based infarct or acute intracranial hemorrhage identified. 3. ASPECTS is 10. Electronically Signed: By: Genevie Ann M.D. On: 07/13/2018 14:16     Labs:   Basic Metabolic Panel: Recent Labs  Lab 07/13/18 1357 07/14/18 0222  NA 136 139  K 3.9 4.4  CL 103 102  CO2 23 27  GLUCOSE 253* 179*  BUN 20 14  CREATININE 0.75 0.75  CALCIUM 9.3 9.4  MG 1.7  --   PHOS 3.8  --    GFR Estimated Creatinine Clearance: 74.8 mL/min (by C-G formula based on SCr of 0.75 mg/dL). Liver Function Tests: Recent Labs  Lab 07/13/18 1357  AST 20  ALT 20  ALKPHOS 77  BILITOT 0.8  PROT 7.1  ALBUMIN 4.2   No results for input(s): LIPASE, AMYLASE in the last 168 hours. No results for input(s): AMMONIA in the last 168 hours. Coagulation profile Recent Labs  Lab 07/13/18 1357  INR 0.9    CBC: Recent Labs  Lab 07/13/18 1357  WBC 6.4  NEUTROABS 4.0  HGB  14.2  HCT 43.8  MCV 88.1  PLT 189     Cardiac Enzymes: Recent Labs  Lab 07/13/18 1835  CKTOTAL 72   BNP: Invalid input(s): POCBNP CBG: Recent Labs  Lab 07/13/18 2116 07/14/18 0614 07/14/18 1131  GLUCAP 154* 215* 202*   D-Dimer No results for input(s): DDIMER in the last 72 hours. Hgb A1c Recent Labs    07/13/18 1835  HGBA1C 11.2*   Lipid Profile Recent Labs    07/14/18 0222  CHOL 205*  HDL 38*  LDLCALC 119*  TRIG 241*  CHOLHDL 5.4   Thyroid function studies No results for input(s): TSH, T4TOTAL, T3FREE, THYROIDAB in the last 72 hours.  Invalid input(s): FREET3 Anemia work up No results for input(s): VITAMINB12, FOLATE, FERRITIN, TIBC, IRON, RETICCTPCT in the last 72 hours. Microbiology Recent Results (from the past 240 hour(s))  SARS Coronavirus 2 (Hosp order,Performed in [facility] lab via [employer])     Status: None   Collection Time: 07/13/18  3:40 PM  Result Value Ref Range Status   SARS Coronavirus 2 ([employer] Now) NEGATIVE NEGATIVE Final    Comment: (NOTE) Interpretive Result Comment(s): COVID 19 Positive SARS CoV 2 target nucleic acids are DETECTED. The SARS CoV 2 RNA is generally detectable in upper and lower respiratory specimens during the acute phase of infection.  Positive results are indicative of active infection with SARS CoV 2.  Clinical correlation with patient history and other diagnostic information is necessary to determine patient infection status.  Positive results do not rule out bacterial infection or coinfection with other viruses. The expected result is Negative. COVID 19 Negative SARS CoV 2 target nucleic acids are NOT DETECTED. The SARS CoV 2 RNA is generally detectable in upper and lower respiratory specimens during the acute phase of infection.  Negative results do not preclude SARS CoV 2 infection, do not rule out coinfections with other pathogens, and should not be used as the sole basis for treatment or other patient management decisions.   Negative results must be combined with clinical  observations, patient history, and epidemiological information. The expected result is Negative. Invalid Presence or absence of SARS CoV 2 nucleic acids cannot be determined. Repeat testing was performed on the submitted specimen and repeated Invalid results were obtained.  If clinically indicated, additional testing on a new specimen with an alternate test methodology [phone]) is advised.  The SARS CoV 2 RNA is generally detectable in upper and lower respiratory specimens during the acute phase of infection. The expected result is Negative. Fact Sheet for Patients:  GolfingFamily.no Fact Sheet for Healthcare Providers: https://www.hernandez-brewer.com/ This test is not yet approved or cleared by the [place] FDA and has been authorized for detection and/or diagnosis of SARS CoV 2 by FDA under an Emergency Use Authorization (EUA).  This EUA will remain in effect (meaning this test can be used) for the duration of the COVID19 d eclaration under Section 564(b)(1) of the Act, 21 U.S.C. section 503-032-8080 3(b)(1), unless the authorization is terminated or revoked sooner. Performed at [facility], [address] [place], [place] [address]      Discharge Instructions:   Discharge Instructions    Diet - low sodium heart healthy   Complete by:  As directed    Diet Carb Modified   Complete by:  As directed    Discharge instructions   Complete by:  As directed    ASA/plavix x 3 weeks then plavix alone   Increase activity  slowly   Complete by:  As directed      Allergies as of 07/14/2018      Reactions   Augmentin [amoxicillin-pot Clavulanate] Other (See Comments)   Altered mental status Did it involve swelling of the face/tongue/throat, SOB, or low BP? Unknown Did it involve sudden or severe rash/hives, skin peeling, or any reaction on the inside of your mouth or nose? Unknown Did  you need to seek medical attention at a hospital or doctor's office? Unknown When did it last happen? unk If all above answers are NO, may proceed with cephalosporin use.   Definity [perflutren Lipid Microsphere] Other (See Comments)   Headache and facial flushing after Definity IVP given. No SOB or Chest pain,and Vital signs stable. Irven Baltimore, RN.   Novocain [procaine] Other (See Comments)   Altered mental status   Promethazine Swelling   Tongue swelling      Medication List    TAKE these medications   aspirin 81 MG EC tablet Take 1 tablet (81 mg total) by mouth daily. Start taking on:  July 15, 2018   atorvastatin 40 MG tablet Commonly known as:  LIPITOR Take 1 tablet (40 mg total) by mouth daily at 6 PM.   clopidogrel 75 MG tablet Commonly known as:  PLAVIX Take 1 tablet (75 mg total) by mouth daily. Start taking on:  July 15, 2018   glipiZIDE 10 MG tablet Commonly known as:  GLUCOTROL Take 10 mg by mouth 2 (two) times daily before a meal.   Insulin Detemir 100 UNIT/ML Pen Commonly known as:  LEVEMIR Inject 10 Units into the skin daily.   Insulin Pen Needle 32G X 4 MM Misc Use daily   metFORMIN 1000 MG tablet Commonly known as:  GLUCOPHAGE Take 1,000 mg by mouth 2 (two) times daily with a meal.         Time coordinating discharge: 25 min  Signed:  Geradine Girt DO  [facility] 07/14/2018, 3:30 PM

## 2018-07-14 NOTE — Progress Notes (Signed)
Inpatient Diabetes Program Recommendations  AACE/ADA: New Consensus Statement on Inpatient Glycemic Control (2015)  Target Ranges:  Prepandial:   less than 140 mg/dL      Peak postprandial:   less than 180 mg/dL (1-2 hours)      Critically ill patients:  140 - 180 mg/dL   Results for Jacqueline [name], Jacqueline [name] (MRN [id number]) as of 07/14/2018 11:17  Ref. Range 07/13/2018 21:16 07/14/2018 06:14  Glucose-Capillary Latest Ref Range: 70 - 99 mg/dL 154 (H) 215 (H)   Review of Glycemic Control  Diabetes history: DM 2 Outpatient Diabetes medications: Glipizide 10 mg BID, Metformin 500 mg BID, was not taking Lantus thinks insurance may cover it now  Current orders for Inpatient glycemic control:  Lantus 10 units Daily Novolog 0-15 units tid   Spoke with patient over the phone. Patient is not happy with current PCP. Was asking for recommendations. Told patient she may have to call the back of the insurance card and see who is in her network and call around to see who is accepting new patients.  Spoke with patient about current A1c, 11.2%. Discussed insulin at time of d/c. Patient stated she was suppose to start insulin but could not afford it at the time. She also mentioned that her insurance now should cover it. I placed CM consult in for benefits check on Levemir and Lantus. Sent patient insulin pen na d vial and syringe videos for her to view. Have ordered the living well with DM educational booklet.   Patient has not been checking her glucose within the last month. Discussed importance of checking CBGs and glucose control. Discussed lifestyle management.  Levemir Flexpen and Lantus Solostar insulin pen covered by insurance. Consult to CM for benefits check for copays.  D/C: Insulin pen Insulin pen needles order # [id number]  Switched patient's pharmacy to [facility] [address] in [place] per patient request.  Thanks,  Tama Headings RN, MSN, BC-ADM Inpatient Diabetes Coordinator Team Pager [phone]  (8a-5p)

## 2018-07-14 NOTE — TOC Initial Note (Signed)
Transition of Care [facility]) - Initial/Assessment Note    Patient Details  Name: Jacqueline [name] MRN: [id number] Date of Birth: 1950-12-19  Transition of [facility]) CM/SW Contact:    Pollie Friar, RN Phone Number: 07/14/2018, 12:34 PM  Clinical Narrative:                 Pt planning of changing her PCP. She already has an idea of who she is going to get.  Pt has transportation home when ready for d/c.   Expected Discharge Plan: Home/Self Care Barriers to Discharge: Continued Medical Work up   Patient Goals and CMS Choice        Expected Discharge Plan and Services Expected Discharge Plan: Home/Self Care       Living arrangements for the past 2 months: Single Family Home                                      Prior Living Arrangements/Services Living arrangements for the past 2 months: Single Family Home Lives with:: Spouse Patient language and need for interpreter reviewed:: Yes(no needs) Do you feel safe going back to the place where you live?: Yes            Criminal Activity/Legal Involvement Pertinent to Current Situation/Hospitalization: No - Comment as needed  Activities of Daily Living      Permission Sought/Granted                  Emotional Assessment Appearance:: Appears stated age Attitude/Demeanor/Rapport: Engaged Affect (typically observed): Pleasant, Accepting, Appropriate Orientation: : Oriented to Self, Oriented to Place, Oriented to  Time, Oriented to Situation   Psych Involvement: No (comment)  Admission diagnosis:  TIA (transient ischemic attack) [G45.9] Patient Active Problem List   Diagnosis Date Noted  . TIA (transient ischemic attack) 07/13/2018  . Left-sided weakness 07/13/2018  . Morbid obesity ([place]) 11/25/2013  . HLD (hyperlipidemia) 11/25/2013  . DM type 2 (diabetes mellitus, type 2) ([place]) 11/25/2013  . Chest pain 11/04/2012   PCP:  Nicola Girt, DO Pharmacy:   [facility] [address]  [place] [address] Phone: [phone] Fax: [phone]     Social Determinants of Health (SDOH) Interventions    Readmission Risk Interventions No flowsheet data found.

## 2018-07-14 NOTE — Plan of Care (Signed)
Adequate for discharge.

## 2018-07-14 NOTE — Evaluation (Signed)
Speech Language Pathology Evaluation Patient Details Name: Jacqueline [name] MRN: [id number] DOB: [birth year] Today's Date: 07/14/2018 Time: 0160-1093 SLP Time Calculation (min) (ACUTE ONLY): 16 min  Problem List:  Patient Active Problem List   Diagnosis Date Noted  . TIA (transient ischemic attack) 07/13/2018  . Left-sided weakness 07/13/2018  . Morbid obesity ([place]) 11/25/2013  . HLD (hyperlipidemia) 11/25/2013  . DM type 2 (diabetes mellitus, type 2) ([place]) 11/25/2013  . Chest pain 11/04/2012   Past Medical History:  Past Medical History:  Diagnosis Date  . Diabetes mellitus   . HLD (hyperlipidemia)   . Hypertension   . Nephrolithiasis   . Obese    Past Surgical History:  Past Surgical History:  Procedure Laterality Date  . ROTATOR CUFF REPAIR  2009  . TUBAL LIGATION  1979   HPI:  Pt is a 68 y.o. female with medical history significant for hypertension, HLD, history of amaurosis fugax years ago (sudden vision loss of both eyes, reports no work up and no recurrent episodes), obesity, type 2 diabetes on metformin and glipizide. She presented on 07/13/2018 with 3 days of intermittent left-sided numbness and tingling to face and ipsilateral arm. CT of the head no intracranial hemorrhage or emergent large vessel occlusion.   Assessment / Plan / Recommendation Clinical Impression  Pt reported that she is currently retired, and was living independently prior to admission. She denied any prior or new deficits in speech, language, or cognition. Her speech and language skills are currently within normal limits and no overt cognitive-linguistic deficits were noted. Further skilled SLP services are not clinically indicated at this time. Pt, and nursing were educated regarding results and recommendations and both parties verbalized understanding as well as agreement with plan of care.    SLP Assessment  SLP Recommendation/Assessment: Patient does not need any further Speech Lanaguage Pathology  Services SLP Visit Diagnosis: Dysarthria and anarthria (R47.1)    Follow Up Recommendations  None    Frequency and Duration           SLP Evaluation Cognition  Overall Cognitive Status: Within Functional Limits for tasks assessed Arousal/Alertness: Awake/alert Orientation Level: Oriented X4 Attention: Focused;Sustained Focused Attention: Appears intact Sustained Attention: Appears intact Memory: Appears intact(Immediate: 3/3; Delayed: 3/3) Awareness: Appears intact Problem Solving: Appears intact(5/5) Executive Function: Reasoning Reasoning: Appears intact       Comprehension  Auditory Comprehension Overall Auditory Comprehension: Appears within functional limits for tasks assessed Yes/No Questions: Within Functional Limits Basic Biographical Questions: (5/5) Complex Questions: (4/5) Commands: Within Functional Limits Two Step Basic Commands: (4/4) Multistep Basic Commands: (4/4) Visual Recognition/Discrimination Discrimination: Within Function Limits Reading Comprehension Reading Status: Within funtional limits    Expression Expression Primary Mode of Expression: Verbal Verbal Expression Overall Verbal Expression: Appears within functional limits for tasks assessed Initiation: No impairment Automatic Speech: Counting;Day of week;Month of year(WNL) Level of Generative/Spontaneous Verbalization: Conversation Repetition: No impairment(5/5) Naming: No impairment Responsive: (5/5) Confrontation: Within functional limits(10/10) Convergent: (Sentence completion: 5/5) Pragmatics: No impairment   Oral / Motor  Oral Motor/Sensory Function Overall Oral Motor/Sensory Function: Within functional limits Motor Speech Overall Motor Speech: Appears within functional limits for tasks assessed Respiration: Within functional limits Phonation: Normal Resonance: Within functional limits Articulation: Within functional limitis Intelligibility: Intelligible Motor Planning:  [place] functional limits Motor Speech Errors: Not applicable   Jacqueline [name], Jacqueline [name], Jacqueline [name] Office number [phone] Pager [phone]  Jacqueline [name] 07/14/2018, 1:55 PM

## 2018-07-14 NOTE — Consult Note (Signed)
Stroke Neurology Consultation Note  Consult Requested by: [facility], seen by [facility] at [facility]  Reason for Consult: Intermittent numbness and tingling left arm and left face with headache  Consult Date: 07/14/2018  The history was obtained from the patient.  During history and examination, all items were able to obtain unless otherwise noted.  History of Present Illness:  Jacqueline [name] is an 68 y.o. Caucasian female with PMH of hypertension, hyperlipidemia, obesity and diabetes presented with 3 episode of left arm and face numbness tingling for the last 3 days.  Patient stated that 3 days ago she was shopping with her friends in the shopping mall, had a sudden onset left facial and arm numbness tingling lasting only 3 minutes and then resolved, but followed by headache, bifrontal throbbing.  Denies photophobia or phonophobia, nausea vomiting.  Lasted about 3 minutes.  She thought that was because of her empty stomach, she ate lunch and then she felt fine.  Second day again before lunch she had again similar episode lasting 3 minutes, resolved followed by similar headache lasting 3 minutes.  Yesterday she was eating lunch started have similar episode plus feeling heaviness of left arm symptoms lasted about 5 minutes followed by a mild brief headache.  She took aspirin and everything resolved.  In ER, she was back to her baseline.  She stated that every time it happens her left arm "veins popped out".   She denies any history of migraine, she rarely had headache.  However she does have visual auras with flashing lights in the past.  She also had one episode of bilateral vision loss 3 4 years ago lasted only 1 to 2 seconds.  She has a history of hypertension, hyperlipidemia and diabetes, she stated that she is taking aspirin 81 however not on statin.  She said that her high blood pressure controlled well, however glucose was not.  Date last known well: Date:  07/13/2018 Time last known well: Time: 13:00 tPA Given: No: NIHSS 0 MRS:  0 NIHSS:  0  Past Medical History:  Diagnosis Date  . Diabetes mellitus   . HLD (hyperlipidemia)   . Hypertension   . Nephrolithiasis   . Obese      Past Surgical History:  Procedure Laterality Date  . ROTATOR CUFF REPAIR  2009  . TUBAL LIGATION  1979    Family History  Problem Relation Age of Onset  . Diabetes Father   . Heart attack Father 20  . Heart disease Mother        hx cabg at 46  . Heart attack Brother      Social History:  reports that she has never smoked. She has never used smokeless tobacco. She reports that she does not drink alcohol or use drugs.  Review of Systems: A full ROS was attempted today and was able to be performed.  Systems assessed include - Constitutional, Eyes, HENT, Respiratory, Cardiovascular, Gastrointestinal, Genitourinary, Integument/breast, Hematologic/lymphatic, Musculoskeletal, Neurological, Behavioral/Psych, Endocrine, Allergic/Immunologic - with pertinent responses as per HPI.  Allergies:  Allergies  Allergen Reactions  . Augmentin [Amoxicillin-Pot Clavulanate] Other (See Comments)    Altered mental status Did it involve swelling of the face/tongue/throat, SOB, or low BP? Unknown Did it involve sudden or severe rash/hives, skin peeling, or any reaction on the inside of your mouth or nose? Unknown Did you need to seek medical attention at a hospital or doctor's office? Unknown When did it last happen? unk If all above answers  are "NO", may proceed with cephalosporin use.   Marland Kitchen Definity [Perflutren Lipid Microsphere] Other (See Comments)    Headache and facial flushing after Definity IVP given. No SOB or Chest pain,and Vital signs stable. Irven [place], RN.  Marland Kitchen Novocain [Procaine] Other (See Comments)    Altered mental status  . Promethazine Swelling    Tongue swelling     Medications: I have reviewed the patient's current medications.  Test  Results: CBC:  Recent Labs  Lab 07/13/18 1357  WBC 6.4  NEUTROABS 4.0  HGB 14.2  HCT 43.8  MCV 88.1  PLT 456   Basic Metabolic Panel:  Recent Labs  Lab 07/13/18 1357 07/14/18 0222  NA 136 139  K 3.9 4.4  CL 103 102  CO2 23 27  GLUCOSE 253* 179*  BUN 20 14  CREATININE 0.75 0.75  CALCIUM 9.3 9.4  MG 1.7  --   PHOS 3.8  --    Liver Function Tests: Recent Labs  Lab 07/13/18 1357  AST 20  ALT 20  ALKPHOS 77  BILITOT 0.8  PROT 7.1  ALBUMIN 4.2   No results for input(s): LIPASE, AMYLASE in the last 168 hours. No results for input(s): AMMONIA in the last 168 hours. Coagulation Studies:  Recent Labs    07/13/18 1357  LABPROT 12.2  INR 0.9   Cardiac Enzymes:  Recent Labs  Lab 07/13/18 1835  CKTOTAL 72   BNP: Invalid input(s): POCBNP CBG:  Recent Labs  Lab 07/13/18 2116 07/14/18 0614 07/14/18 1131  GLUCAP 154* 215* 202*   Urinalysis:  Recent Labs  Lab 07/13/18 Alexander  LABSPEC 1.010  PHURINE 6.0  GLUCOSEU >=500*  HGBUR NEGATIVE  BILIRUBINUR NEGATIVE  KETONESUR NEGATIVE  PROTEINUR NEGATIVE  NITRITE NEGATIVE  LEUKOCYTESUR SMALL*   Microbiology:  Results for orders placed or performed during the hospital encounter of 07/13/18  SARS Coronavirus 2 (Hosp order,Performed in [facility] lab via [facility])     Status: None   Collection Time: 07/13/18  3:40 PM  Result Value Ref Range Status   SARS Coronavirus 2 ([facility] Now) NEGATIVE NEGATIVE Final    Comment: (NOTE) Interpretive Result Comment(s): COVID 19 Positive SARS CoV 2 target nucleic acids are DETECTED. The SARS CoV 2 RNA is generally detectable in upper and lower respiratory specimens during the acute phase of infection.  Positive results are indicative of active infection with SARS CoV 2.  Clinical correlation with patient history and other diagnostic information is necessary to determine patient infection status.  Positive results do not rule out bacterial infection  or coinfection with other viruses. The expected result is Negative. COVID 19 Negative SARS CoV 2 target nucleic acids are NOT DETECTED. The SARS CoV 2 RNA is generally detectable in upper and lower respiratory specimens during the acute phase of infection.  Negative results do not preclude SARS CoV 2 infection, do not rule out coinfections with other pathogens, and should not be used as the sole basis for treatment or other patient management decisions.  Negative results must be combined with clinical  observations, patient history, and epidemiological information. The expected result is Negative. Invalid Presence or absence of SARS CoV 2 nucleic acids cannot be determined. Repeat testing was performed on the submitted specimen and repeated Invalid results were obtained.  If clinically indicated, additional testing on a new specimen with an alternate test methodology [phone]) is advised.  The SARS CoV 2 RNA is generally detectable in upper and lower respiratory specimens  during the acute phase of infection. The expected result is Negative. Fact Sheet for Patients:  GolfingFamily.no Fact Sheet for Healthcare Providers: https://www.hernandez-brewer.com/ This test is not yet approved or cleared by the [place] FDA and has been authorized for detection and/or diagnosis of SARS CoV 2 by FDA under an Emergency Use Authorization (EUA).  This EUA will remain in effect (meaning this test can be used) for the duration of the COVID19 d eclaration under Section 564(b)(1) of the Act, 21 U.S.C. section [phone] 3(b)(1), unless the authorization is terminated or revoked sooner. Performed at [facility], [address] [place], [place] [address]    Lipid Panel:     Component Value Date/Time   CHOL 205 (H) 07/14/2018 0222   TRIG 241 (H) 07/14/2018 0222   HDL 38 (L) 07/14/2018 0222   CHOLHDL 5.4 07/14/2018 0222   VLDL 48 (H) 07/14/2018 0222    LDLCALC 119 (H) 07/14/2018 0222   HgbA1c:  Lab Results  Component Value Date   HGBA1C 11.2 (H) 07/13/2018   Urine Drug Screen:     Component Value Date/Time   LABOPIA NONE DETECTED 07/13/2018 1525   COCAINSCRNUR NONE DETECTED 07/13/2018 1525   LABBENZ NONE DETECTED 07/13/2018 1525   AMPHETMU NONE DETECTED 07/13/2018 1525   THCU NONE DETECTED 07/13/2018 1525   LABBARB NONE DETECTED 07/13/2018 1525    Alcohol Level:  Recent Labs  Lab 07/13/18 1430  ETH <10    Ct Angio Head W Or Wo Contrast  Result Date: 07/14/2018 CLINICAL DATA:  Left-sided numbness with tingling of the face and left arm EXAM: CT ANGIOGRAPHY HEAD AND NECK TECHNIQUE: Multidetector CT imaging of the head and neck was performed using the standard protocol during bolus administration of intravenous contrast. Multiplanar CT image reconstructions and MIPs were obtained to evaluate the vascular anatomy. Carotid stenosis measurements (when applicable) are obtained utilizing NASCET criteria, using the distal internal carotid diameter as the denominator. CONTRAST:  1mL OMNIPAQUE IOHEXOL 350 MG/ML SOLN COMPARISON:  Head CT 07/13/2018. FINDINGS: CT HEAD FINDINGS Brain: There is no mass, hemorrhage or extra-axial collection. The size and configuration of the ventricles and extra-axial CSF spaces are normal. Unchanged hypodensity of the right centrum semiovale. This appears oriented perpendicularly to the long axis of the corpus callosum. The brain parenchyma is normal. Skull: The visualized skull base, calvarium and extracranial soft tissues are normal. Sinuses/Orbits: No fluid levels or advanced mucosal thickening of the visualized paranasal sinuses. No mastoid or middle ear effusion. The orbits are normal. CTA NECK FINDINGS SKELETON: There is no bony spinal canal stenosis. No lytic or blastic lesion. OTHER NECK: Normal pharynx, larynx and major salivary glands. No cervical lymphadenopathy. Unremarkable thyroid gland. UPPER CHEST: No  pneumothorax or pleural effusion. No nodules or masses. AORTIC ARCH: There is no calcific atherosclerosis of the aortic arch. There is no aneurysm, dissection or hemodynamically significant stenosis of the visualized ascending aorta and aortic arch. Conventional 3 vessel aortic branching pattern. The visualized proximal subclavian arteries are widely patent. RIGHT CAROTID SYSTEM: --Common carotid artery: Widely patent origin without common carotid artery dissection or aneurysm. --Internal carotid artery: No dissection, occlusion or aneurysm. There is mixed density atherosclerosis extending into the proximal ICA, resulting in less than 50% stenosis. --External carotid artery: No acute abnormality. LEFT CAROTID SYSTEM: --Common carotid artery: Widely patent origin without common carotid artery dissection or aneurysm. --Internal carotid artery: No dissection, occlusion or aneurysm. There is mixed density atherosclerosis extending into the proximal ICA, resulting in  less than 50% stenosis. --External carotid artery: No acute abnormality. VERTEBRAL ARTERIES: Left dominant configuration. Both origins are clearly patent. No dissection, occlusion or flow-limiting stenosis to the skull base (V1-V3 segments). CTA HEAD FINDINGS POSTERIOR CIRCULATION: --Vertebral arteries: Normal V4 segments. --Posterior inferior cerebellar arteries (PICA): The right PICA and AICA share a common origin. Normal origin of the left PICA from the ipsilateral vertebral artery. --Anterior inferior cerebellar arteries (AICA): Normal origin of the right AICA from the basilar artery. Left AICA not clearly visualized. --Basilar artery: Normal. --Superior cerebellar arteries: Normal. --Posterior cerebral arteries (PCA): Normal. The left PCA is predominantly supplied by the posterior communicating artery. ANTERIOR CIRCULATION: --Intracranial internal carotid arteries: Normal. --Anterior cerebral arteries (ACA): Normal. Both A1 segments are present. Patent  anterior communicating artery (a-comm). --Middle cerebral arteries (MCA): Normal. VENOUS SINUSES: As permitted by contrast timing, patent. ANATOMIC VARIANTS: None Review of the MIP images confirms the above findings. IMPRESSION: 1. No intracranial hemorrhage or emergent large vessel occlusion. 2. Unchanged appearance of right frontal white matter lesion, perpendicular to the long axis of the lateral ventricles. While small vessel infarcts could have this appearance, the orientation of this lesion is concerning for demyelination. MRI of the brain with and without contrast is recommended to assess for multiple sclerosis. 3. Bilateral carotid bifurcation atherosclerosis with less than 50% stenosis. Electronically Signed   By: Ulyses Jarred M.D.   On: 07/14/2018 00:56   Ct Angio Neck W Or Wo Contrast  Result Date: 07/14/2018 CLINICAL DATA:  Left-sided numbness with tingling of the face and left arm EXAM: CT ANGIOGRAPHY HEAD AND NECK TECHNIQUE: Multidetector CT imaging of the head and neck was performed using the standard protocol during bolus administration of intravenous contrast. Multiplanar CT image reconstructions and MIPs were obtained to evaluate the vascular anatomy. Carotid stenosis measurements (when applicable) are obtained utilizing NASCET criteria, using the distal internal carotid diameter as the denominator. CONTRAST:  54mL OMNIPAQUE IOHEXOL 350 MG/ML SOLN COMPARISON:  Head CT 07/13/2018. FINDINGS: CT HEAD FINDINGS Brain: There is no mass, hemorrhage or extra-axial collection. The size and configuration of the ventricles and extra-axial CSF spaces are normal. Unchanged hypodensity of the right centrum semiovale. This appears oriented perpendicularly to the long axis of the corpus callosum. The brain parenchyma is normal. Skull: The visualized skull base, calvarium and extracranial soft tissues are normal. Sinuses/Orbits: No fluid levels or advanced mucosal thickening of the visualized paranasal sinuses.  No mastoid or middle ear effusion. The orbits are normal. CTA NECK FINDINGS SKELETON: There is no bony spinal canal stenosis. No lytic or blastic lesion. OTHER NECK: Normal pharynx, larynx and major salivary glands. No cervical lymphadenopathy. Unremarkable thyroid gland. UPPER CHEST: No pneumothorax or pleural effusion. No nodules or masses. AORTIC ARCH: There is no calcific atherosclerosis of the aortic arch. There is no aneurysm, dissection or hemodynamically significant stenosis of the visualized ascending aorta and aortic arch. Conventional 3 vessel aortic branching pattern. The visualized proximal subclavian arteries are widely patent. RIGHT CAROTID SYSTEM: --Common carotid artery: Widely patent origin without common carotid artery dissection or aneurysm. --Internal carotid artery: No dissection, occlusion or aneurysm. There is mixed density atherosclerosis extending into the proximal ICA, resulting in less than 50% stenosis. --External carotid artery: No acute abnormality. LEFT CAROTID SYSTEM: --Common carotid artery: Widely patent origin without common carotid artery dissection or aneurysm. --Internal carotid artery: No dissection, occlusion or aneurysm. There is mixed density atherosclerosis extending into the proximal ICA, resulting in less than 50% stenosis. --External carotid artery: No  acute abnormality. VERTEBRAL ARTERIES: Left dominant configuration. Both origins are clearly patent. No dissection, occlusion or flow-limiting stenosis to the skull base (V1-V3 segments). CTA HEAD FINDINGS POSTERIOR CIRCULATION: --Vertebral arteries: Normal V4 segments. --Posterior inferior cerebellar arteries (PICA): The right PICA and AICA share a common origin. Normal origin of the left PICA from the ipsilateral vertebral artery. --Anterior inferior cerebellar arteries (AICA): Normal origin of the right AICA from the basilar artery. Left AICA not clearly visualized. --Basilar artery: Normal. --Superior cerebellar  arteries: Normal. --Posterior cerebral arteries (PCA): Normal. The left PCA is predominantly supplied by the posterior communicating artery. ANTERIOR CIRCULATION: --Intracranial internal carotid arteries: Normal. --Anterior cerebral arteries (ACA): Normal. Both A1 segments are present. Patent anterior communicating artery (a-comm). --Middle cerebral arteries (MCA): Normal. VENOUS SINUSES: As permitted by contrast timing, patent. ANATOMIC VARIANTS: None Review of the MIP images confirms the above findings. IMPRESSION: 1. No intracranial hemorrhage or emergent large vessel occlusion. 2. Unchanged appearance of right frontal white matter lesion, perpendicular to the long axis of the lateral ventricles. While small vessel infarcts could have this appearance, the orientation of this lesion is concerning for demyelination. MRI of the brain with and without contrast is recommended to assess for multiple sclerosis. 3. Bilateral carotid bifurcation atherosclerosis with less than 50% stenosis. Electronically Signed   By: Ulyses Jarred M.D.   On: 07/14/2018 00:56   Mr Brain Wo Contrast  Result Date: 07/14/2018 CLINICAL DATA:  TIA. Intermittent left-sided numbness and tingling in the face and arm EXAM: MRI HEAD WITHOUT CONTRAST TECHNIQUE: Multiplanar, multiecho pulse sequences of the brain and surrounding structures were obtained without intravenous contrast. COMPARISON:  CT and CTA from yesterday FINDINGS: Brain: No acute infarction, hemorrhage, hydrocephalus, extra-axial collection or mass lesion. DWI high intensity in the left frontal white matter is shine through based on the other sequences. There are lacunes in the bifrontal white matter, more numerous on the right, attributed to old microvascular ischemic insults given patient's vascular risk factors. Vascular: Major flow voids are preserved Skull and upper cervical spine: Negative for marrow lesion Sinuses/Orbits: Negative IMPRESSION: 1. No emergent finding. 2. Small  remote lacunar infarcts in the deep cerebral white matter. Electronically Signed   By: Monte Fantasia M.D.   On: 07/14/2018 09:26   Ct Head Code Stroke Wo Contrast  Addendum Date: 07/13/2018   ADDENDUM REPORT: 07/13/2018 14:24 ADDENDUM: Study discussed by telephone with Dr. Aletta Edouard on 07/13/2018 at 1421 hours. Electronically Signed   By: Genevie Ann M.D.   On: 07/13/2018 14:24   Result Date: 07/13/2018 CLINICAL DATA:  Code stroke. 68 year old female with numbness in the left face and arm for 3 days with associated arm heaviness today. EXAM: CT HEAD WITHOUT CONTRAST TECHNIQUE: Contiguous axial images were obtained from the base of the skull through the vertex without intravenous contrast. COMPARISON:  None. FINDINGS: Brain: No midline shift, mass effect, or evidence of intracranial mass lesion. No ventriculomegaly. No acute intracranial hemorrhage identified. Patchy white matter hypodensity in right centrum semiovale (series 2, image 21). No superimposed cortically based acute infarct identified. And gray-white matter differentiation elsewhere is within normal limits. No cortical encephalomalacia identified. Vascular: Mild Calcified atherosclerosis at the skull base. No suspicious intracranial vascular hyperdensity. Skull: Negative. Sinuses/Orbits: Visualized paranasal sinuses and mastoids are clear. Other: Visualized orbits and scalp soft tissues are within normal limits. ASPECTS [facility] Stroke Program Early CT Score) - Ganglionic level infarction (caudate, lentiform nuclei, internal capsule, insula, M1-M3 cortex): 7 - Supraganglionic infarction (M4-M6 cortex): 3 Total  score (0-10 with 10 being normal): 10 IMPRESSION: 1. Age indeterminate white matter small vessel disease type changes in the right centrum semiovale. 2. No superimposed acute cortically based infarct or acute intracranial hemorrhage identified. 3. ASPECTS is 10. Electronically Signed: By: Genevie Ann M.D. On: 07/13/2018 14:16    EKG: normal EKG,  normal sinus rhythm.  Physical Examination: Temp:  [97.8 F (36.6 C)-99.1 F (37.3 C)] 99.1 F (37.3 C) (06/03 1134) Pulse Rate:  [63-72] 68 (06/03 1134) Resp:  [16-22] 20 (06/03 1134) BP: (122-161)/(64-80) 129/70 (06/03 1134) SpO2:  [92 %-99 %] 92 % (06/03 1134) Weight:  [93 kg] 93 kg (06/02 1801)  General - Well nourished, well developed, in no apparent distress.  Ophthalmologic - fundi not visualized due to noncooperation.  Cardiovascular - Regular rate and rhythm.  Mental Status -  Level of arousal and orientation to time, place, and person were intact. Language including expression, naming, repetition, comprehension was assessed and found intact. Fund of Knowledge was assessed and was intact.  Cranial Nerves II - XII - II - Visual field intact OU III, IV, VI - Extraocular movements intact. V - Facial sensation intact bilaterally. VII - Facial movement intact bilaterally. VIII - Hearing & vestibular intact bilaterally. X - Palate elevates symmetrically. XI - Chin turning & shoulder shrug intact bilaterally. XII - Tongue protrusion intact.  Motor Strength - The patient's strength was normal in all extremities and pronator drift was absent.  Bulk was normal and fasciculations were absent.   Motor Tone - Muscle tone was assessed at the neck and appendages and was normal.  Reflexes - The patient's reflexes were symmetrical in all extremities and she had no pathological reflexes.  Sensory - Light touch, temperature/pinprick were assessed and were symmetrical.    Coordination - The patient had normal movements in the hands and feet with no ataxia or dysmetria.  Tremor was absent.  Gait and Station - deferred.    Data Reviewed: Ct Angio Head W Or Wo Contrast  Result Date: 07/14/2018 CLINICAL DATA:  Left-sided numbness with tingling of the face and left arm EXAM: CT ANGIOGRAPHY HEAD AND NECK TECHNIQUE: Multidetector CT imaging of the head and neck was performed using the  standard protocol during bolus administration of intravenous contrast. Multiplanar CT image reconstructions and MIPs were obtained to evaluate the vascular anatomy. Carotid stenosis measurements (when applicable) are obtained utilizing NASCET criteria, using the distal internal carotid diameter as the denominator. CONTRAST:  26mL OMNIPAQUE IOHEXOL 350 MG/ML SOLN COMPARISON:  Head CT 07/13/2018. FINDINGS: CT HEAD FINDINGS Brain: There is no mass, hemorrhage or extra-axial collection. The size and configuration of the ventricles and extra-axial CSF spaces are normal. Unchanged hypodensity of the right centrum semiovale. This appears oriented perpendicularly to the long axis of the corpus callosum. The brain parenchyma is normal. Skull: The visualized skull base, calvarium and extracranial soft tissues are normal. Sinuses/Orbits: No fluid levels or advanced mucosal thickening of the visualized paranasal sinuses. No mastoid or middle ear effusion. The orbits are normal. CTA NECK FINDINGS SKELETON: There is no bony spinal canal stenosis. No lytic or blastic lesion. OTHER NECK: Normal pharynx, larynx and major salivary glands. No cervical lymphadenopathy. Unremarkable thyroid gland. UPPER CHEST: No pneumothorax or pleural effusion. No nodules or masses. AORTIC ARCH: There is no calcific atherosclerosis of the aortic arch. There is no aneurysm, dissection or hemodynamically significant stenosis of the visualized ascending aorta and aortic arch. Conventional 3 vessel aortic branching pattern. The visualized proximal subclavian  arteries are widely patent. RIGHT CAROTID SYSTEM: --Common carotid artery: Widely patent origin without common carotid artery dissection or aneurysm. --Internal carotid artery: No dissection, occlusion or aneurysm. There is mixed density atherosclerosis extending into the proximal ICA, resulting in less than 50% stenosis. --External carotid artery: No acute abnormality. LEFT CAROTID SYSTEM: --Common  carotid artery: Widely patent origin without common carotid artery dissection or aneurysm. --Internal carotid artery: No dissection, occlusion or aneurysm. There is mixed density atherosclerosis extending into the proximal ICA, resulting in less than 50% stenosis. --External carotid artery: No acute abnormality. VERTEBRAL ARTERIES: Left dominant configuration. Both origins are clearly patent. No dissection, occlusion or flow-limiting stenosis to the skull base (V1-V3 segments). CTA HEAD FINDINGS POSTERIOR CIRCULATION: --Vertebral arteries: Normal V4 segments. --Posterior inferior cerebellar arteries (PICA): The right PICA and AICA share a common origin. Normal origin of the left PICA from the ipsilateral vertebral artery. --Anterior inferior cerebellar arteries (AICA): Normal origin of the right AICA from the basilar artery. Left AICA not clearly visualized. --Basilar artery: Normal. --Superior cerebellar arteries: Normal. --Posterior cerebral arteries (PCA): Normal. The left PCA is predominantly supplied by the posterior communicating artery. ANTERIOR CIRCULATION: --Intracranial internal carotid arteries: Normal. --Anterior cerebral arteries (ACA): Normal. Both A1 segments are present. Patent anterior communicating artery (a-comm). --Middle cerebral arteries (MCA): Normal. VENOUS SINUSES: As permitted by contrast timing, patent. ANATOMIC VARIANTS: None Review of the MIP images confirms the above findings. IMPRESSION: 1. No intracranial hemorrhage or emergent large vessel occlusion. 2. Unchanged appearance of right frontal white matter lesion, perpendicular to the long axis of the lateral ventricles. While small vessel infarcts could have this appearance, the orientation of this lesion is concerning for demyelination. MRI of the brain with and without contrast is recommended to assess for multiple sclerosis. 3. Bilateral carotid bifurcation atherosclerosis with less than 50% stenosis. Electronically Signed   By:  Ulyses Jarred M.D.   On: 07/14/2018 00:56   Ct Angio Neck W Or Wo Contrast  Result Date: 07/14/2018 CLINICAL DATA:  Left-sided numbness with tingling of the face and left arm EXAM: CT ANGIOGRAPHY HEAD AND NECK TECHNIQUE: Multidetector CT imaging of the head and neck was performed using the standard protocol during bolus administration of intravenous contrast. Multiplanar CT image reconstructions and MIPs were obtained to evaluate the vascular anatomy. Carotid stenosis measurements (when applicable) are obtained utilizing NASCET criteria, using the distal internal carotid diameter as the denominator. CONTRAST:  13mL OMNIPAQUE IOHEXOL 350 MG/ML SOLN COMPARISON:  Head CT 07/13/2018. FINDINGS: CT HEAD FINDINGS Brain: There is no mass, hemorrhage or extra-axial collection. The size and configuration of the ventricles and extra-axial CSF spaces are normal. Unchanged hypodensity of the right centrum semiovale. This appears oriented perpendicularly to the long axis of the corpus callosum. The brain parenchyma is normal. Skull: The visualized skull base, calvarium and extracranial soft tissues are normal. Sinuses/Orbits: No fluid levels or advanced mucosal thickening of the visualized paranasal sinuses. No mastoid or middle ear effusion. The orbits are normal. CTA NECK FINDINGS SKELETON: There is no bony spinal canal stenosis. No lytic or blastic lesion. OTHER NECK: Normal pharynx, larynx and major salivary glands. No cervical lymphadenopathy. Unremarkable thyroid gland. UPPER CHEST: No pneumothorax or pleural effusion. No nodules or masses. AORTIC ARCH: There is no calcific atherosclerosis of the aortic arch. There is no aneurysm, dissection or hemodynamically significant stenosis of the visualized ascending aorta and aortic arch. Conventional 3 vessel aortic branching pattern. The visualized proximal subclavian arteries are widely patent. RIGHT CAROTID SYSTEM: --Common  carotid artery: Widely patent origin without common  carotid artery dissection or aneurysm. --Internal carotid artery: No dissection, occlusion or aneurysm. There is mixed density atherosclerosis extending into the proximal ICA, resulting in less than 50% stenosis. --External carotid artery: No acute abnormality. LEFT CAROTID SYSTEM: --Common carotid artery: Widely patent origin without common carotid artery dissection or aneurysm. --Internal carotid artery: No dissection, occlusion or aneurysm. There is mixed density atherosclerosis extending into the proximal ICA, resulting in less than 50% stenosis. --External carotid artery: No acute abnormality. VERTEBRAL ARTERIES: Left dominant configuration. Both origins are clearly patent. No dissection, occlusion or flow-limiting stenosis to the skull base (V1-V3 segments). CTA HEAD FINDINGS POSTERIOR CIRCULATION: --Vertebral arteries: Normal V4 segments. --Posterior inferior cerebellar arteries (PICA): The right PICA and AICA share a common origin. Normal origin of the left PICA from the ipsilateral vertebral artery. --Anterior inferior cerebellar arteries (AICA): Normal origin of the right AICA from the basilar artery. Left AICA not clearly visualized. --Basilar artery: Normal. --Superior cerebellar arteries: Normal. --Posterior cerebral arteries (PCA): Normal. The left PCA is predominantly supplied by the posterior communicating artery. ANTERIOR CIRCULATION: --Intracranial internal carotid arteries: Normal. --Anterior cerebral arteries (ACA): Normal. Both A1 segments are present. Patent anterior communicating artery (a-comm). --Middle cerebral arteries (MCA): Normal. VENOUS SINUSES: As permitted by contrast timing, patent. ANATOMIC VARIANTS: None Review of the MIP images confirms the above findings. IMPRESSION: 1. No intracranial hemorrhage or emergent large vessel occlusion. 2. Unchanged appearance of right frontal white matter lesion, perpendicular to the long axis of the lateral ventricles. While small vessel infarcts  could have this appearance, the orientation of this lesion is concerning for demyelination. MRI of the brain with and without contrast is recommended to assess for multiple sclerosis. 3. Bilateral carotid bifurcation atherosclerosis with less than 50% stenosis. Electronically Signed   By: Ulyses Jarred M.D.   On: 07/14/2018 00:56   Mr Brain Wo Contrast  Result Date: 07/14/2018 CLINICAL DATA:  TIA. Intermittent left-sided numbness and tingling in the face and arm EXAM: MRI HEAD WITHOUT CONTRAST TECHNIQUE: Multiplanar, multiecho pulse sequences of the brain and surrounding structures were obtained without intravenous contrast. COMPARISON:  CT and CTA from yesterday FINDINGS: Brain: No acute infarction, hemorrhage, hydrocephalus, extra-axial collection or mass lesion. DWI high intensity in the left frontal white matter is shine through based on the other sequences. There are lacunes in the bifrontal white matter, more numerous on the right, attributed to old microvascular ischemic insults given patient's vascular risk factors. Vascular: Major flow voids are preserved Skull and upper cervical spine: Negative for marrow lesion Sinuses/Orbits: Negative IMPRESSION: 1. No emergent finding. 2. Small remote lacunar infarcts in the deep cerebral white matter. Electronically Signed   By: Monte Fantasia M.D.   On: 07/14/2018 09:26   Ct Head Code Stroke Wo Contrast  Addendum Date: 07/13/2018   ADDENDUM REPORT: 07/13/2018 14:24 ADDENDUM: Study discussed by telephone with Dr. Aletta Edouard on 07/13/2018 at 1421 hours. Electronically Signed   By: Genevie Ann M.D.   On: 07/13/2018 14:24   Result Date: 07/13/2018 CLINICAL DATA:  Code stroke. 68 year old female with numbness in the left face and arm for 3 days with associated arm heaviness today. EXAM: CT HEAD WITHOUT CONTRAST TECHNIQUE: Contiguous axial images were obtained from the base of the skull through the vertex without intravenous contrast. COMPARISON:  None. FINDINGS:  Brain: No midline shift, mass effect, or evidence of intracranial mass lesion. No ventriculomegaly. No acute intracranial hemorrhage identified. Patchy white matter hypodensity in right  centrum semiovale (series 2, image 21). No superimposed cortically based acute infarct identified. And gray-white matter differentiation elsewhere is within normal limits. No cortical encephalomalacia identified. Vascular: Mild Calcified atherosclerosis at the skull base. No suspicious intracranial vascular hyperdensity. Skull: Negative. Sinuses/Orbits: Visualized paranasal sinuses and mastoids are clear. Other: Visualized orbits and scalp soft tissues are within normal limits. ASPECTS [facility] Stroke Program Early CT Score) - Ganglionic level infarction (caudate, lentiform nuclei, internal capsule, insula, M1-M3 cortex): 7 - Supraganglionic infarction (M4-M6 cortex): 3 Total score (0-10 with 10 being normal): 10 IMPRESSION: 1. Age indeterminate white matter small vessel disease type changes in the right centrum semiovale. 2. No superimposed acute cortically based infarct or acute intracranial hemorrhage identified. 3. ASPECTS is 10. Electronically Signed: By: Genevie Ann M.D. On: 07/13/2018 14:16     Assessment:  [name] is a 68 y.o. female with history of HTN, HLD and DB presenting with intermittent numbness and tingling in the left arm and face x 3 days followed by headache.   Right brain TIA given risk factors vs complicated migraine given headache followed each episode  Code Stroke CT head No acute stroke.   CTA head & neck no LVO.  Unchanged right frontal white matter lesion.  B ICA atherosclerosis <50%  MRI no acute stroke.  Small old deep white matter infarcts  2D Echo EF 60 to 65%, no source of embolus  LDL 119  HgbA1c 11.2  Heparin 5000 units sq tid for VTE prophylaxis  Diet-carb modified thin liquid  No antithrombotic prior to admission, now on aspirin 81 mg daily and clopidogrel 75 mg daily  following load.  Continue DAPT x3 weeks then aspirin alone   Therapy recommendations: No PT, no OT, no SLP  Disposition: Return home  Hypertension  Home meds: None   stable . Permissive hypertension (OK if < 220/120) but gradually normalize in 5-7 days . Long-term BP goal normotensive  Hyperlipidemia  Home meds: No statin  Now on Lipitor 40  LDL 119, goal < 70  Continue statin at discharge  Diabetes type II, uncontrolled  HgbA1c 11.2, goal < 7.0  Uncontrolled  SSI  CBG monitoring  Close PCP follow-up and better DM control  Other Stroke Risk Factors  Advanced age  Obesity, Body mass index is 34.12 kg/m., recommend weight loss, diet and exercise as appropriate   1 episode of bilateral vision loss lasting only 1 to 2 seconds about 3 to 4 years ago  Hospital day # 1   Thank you for this consultation and allowing [place] to participate in the care of this patient.  Neurology will sign off. Please call with questions. Pt will follow up with stroke clinic NP at [facility] in about 4 weeks. Thanks for the consult.  Rosalin Hawking, MD PhD Stroke Neurology 07/14/2018 6:52 PM   To contact Stroke Continuity provider, please refer to [email or web address] After hours, contact General Neurology

## 2018-07-14 NOTE — Care Management CC44 (Signed)
Condition Code 44 Documentation Completed  Patient Details  Name: Jacqueline [name] MRN: [id number] Date of Birth: 11/13/1950   Condition Code 44 given:  Yes Patient signature on Condition Code 44 notice:  Yes Documentation of 2 MD's agreement:  Yes Code 44 added to claim:  Yes    Pollie Friar, RN 07/14/2018, 12:32 PM

## 2018-07-14 NOTE — Evaluation (Signed)
Physical Therapy Evaluation Patient Details Name: Jacqueline [name] MRN: [id number] DOB: 11-11-1950 Today's Date: 07/14/2018   History of Present Illness  Pt is a 68 y/o female presenting with 3 day hx of intermittent L sided numbness, weakness and slurred specech. CT negative. MRI pending.  PMH: hypertension, HLD, history of amaurosis fugax years ago (sudden vision loss of both eyes, reports no work up and no recurrent episodes) obesity, type 2 diabetes.   Clinical Impression  Patient appears to be at baseline functioning. She had no balance defiicits with ambulation or static balance. She has no need for further skilled therapy.      Follow Up Recommendations No PT follow up    Equipment Recommendations  Rolling walker with 5" wheels    Recommendations for Other Services Rehab consult     Precautions / Restrictions Restrictions Weight Bearing Restrictions: No      Mobility  Bed Mobility Overal bed mobility: Independent             General bed mobility comments: able to get in and out of bed without difficulty   Transfers Overall transfer level: Independent               General transfer comment: No assistance required   Ambulation/Gait Ambulation/Gait assistance: Independent Gait Distance (Feet): 100 Feet     Gait velocity: decreased    General Gait Details: able to ambulte 100' without syncope. Able to tuen head, nod, and change speeds  Stairs            Wheelchair Mobility    Modified Rankin (Stroke Patients Only) Modified Rankin (Stroke Patients Only) Pre-Morbid Rankin Score: No symptoms Modified Rankin: No symptoms     Balance Overall balance assessment: Independent                             High Level Balance Comments: tandem stance indepdndent; narrow base independnet              Pertinent Vitals/Pain Pain Assessment: No/denies pain    Home Living Family/patient expects to be discharged to:: Private  residence Living Arrangements: Spouse/significant other Available Help at Discharge: Family;Available 24 hours/day Type of Home: House Home Access: Level entry     Home Layout: One level;Other (Comment);Laundry or work area in basement(1 step to [employer] ) Actor: None      Prior Function Level of Independence: Independent         Comments: takes care of 3 y/o grandson 3 days/week     Hand Dominance        Extremity/Trunk Assessment   Upper Extremity Assessment Upper Extremity Assessment: Defer to OT evaluation    Lower Extremity Assessment Lower Extremity Assessment: Overall WFL for tasks assessed    Cervical / Trunk Assessment Cervical / Trunk Assessment: Normal  Communication   Communication: No difficulties  Cognition Arousal/Alertness: Awake/alert Behavior During Therapy: WFL for tasks assessed/performed Overall Cognitive Status: Within Functional Limits for tasks assessed                                        General Comments General comments (skin integrity, edema, etc.): finger to noes noraml; peripheral vision normal; coordination normal; tracking normal     Exercises     Assessment/Plan    PT Assessment Patent does not need any further PT services  PT Problem List         PT Treatment Interventions      PT Goals (Current goals can be found in the Care Plan section)  Acute Rehab PT Goals Patient Stated Goal: to get home  PT Goal Formulation: With patient Time For Goal Achievement: 07/21/18 Potential to Achieve Goals: Good    Frequency     Barriers to discharge        Co-evaluation               AM-PAC PT "6 Clicks" Mobility  Outcome Measure Help needed turning from your back to your side while in a flat bed without using bedrails?: None Help needed moving from lying on your back to sitting on the side of a flat bed without using bedrails?: None Help needed moving to and from a bed to a chair  (including a wheelchair)?: None Help needed standing up from a chair using your arms (e.g., wheelchair or bedside chair)?: None Help needed to walk in hospital room?: None Help needed climbing 3-5 steps with a railing? : None 6 Click Score: 24    End of Session Equipment Utilized During Treatment: Gait belt Activity Tolerance: Patient tolerated treatment well Patient left: in bed;with call bell/phone within reach;with bed alarm set Nurse Communication: Mobility status PT Visit Diagnosis: Other abnormalities of gait and mobility (R26.89)    Time: 1145-1200 PT Time Calculation (min) (ACUTE ONLY): 15 min   Charges:   PT Evaluation $PT Eval Low Complexity: 1 Low            Carney Living PT DPT  07/14/2018, 1:30 PM

## 2018-07-14 NOTE — Discharge Instructions (Signed)
[name], NP with [facility] in [employer] the insurance card number to see which providers are covered under insurance and you may have to call to see who is accepting new patients.

## 2018-07-14 NOTE — Care Management Obs Status (Signed)
[facility] NOTIFICATION   Patient Details  Name: DAYANIRA GIOVANNETTI MRN: [id number] Date of Birth: [birth year]   Medicare Observation Status Notification Given:  Yes    Pollie Friar, RN 07/14/2018, 12:32 PM

## 2018-07-14 NOTE — TOC Transition Note (Signed)
Transition of Care [facility]) - CM/SW Discharge Note   Patient Details  Name: Jacqueline [name] MRN: [id number] Date of Birth: Aug 20, 1950  Transition of Care [facility]) CM/SW Contact:  Pollie Friar, RN Phone Number: 07/14/2018, 5:02 PM   Clinical Narrative:    Pt discharging home with self care. No f/u per PT/OT and no DME needs.  Husband is providing transport home.   Final next level of care: Home/Self Care Barriers to Discharge: Barriers Resolved   Patient Goals and CMS Choice        Discharge Placement                       Discharge Plan and Services                                     Social Determinants of Health (SDOH) Interventions     Readmission Risk Interventions No flowsheet data found.

## 2018-07-14 NOTE — ED Notes (Signed)
The pt was admitted yesterday for possible stroke.  She just left here 1500 today    For 4 days she has had episodes of numbness in her lt shoulder and a headache when she arrived tonjight a code stoke was called again.  Her nih was 0  She has no symptoms at present.  She has seen neurology here and has been cleared and the code stroke was cancelled

## 2018-07-14 NOTE — ED Provider Notes (Signed)
[facility] Provider Note   CSN: [id number] Arrival date & time: 07/14/18  2138  An emergency department physician performed an initial assessment on this suspected stroke patient at 2200.  History   Chief Complaint Chief Complaint  Patient presents with   Numbness    HPI Jacqueline [name] is a 68 y.o. female.     Patient is a 68 year old female with past medical history of diabetes, hypertension, and recent admission for strokelike symptoms.  Patient underwent extensive work-up, however no definite stroke was identified.  Patient was discharged earlier this afternoon, then returns with a recurrence of numbness in her left shoulder and headache.  She was brought here as a code stroke.  The history is provided by the patient.    Past Medical History:  Diagnosis Date   Diabetes mellitus    HLD (hyperlipidemia)    Hypertension    Nephrolithiasis    Obese     Patient Active Problem List   Diagnosis Date Noted   TIA (transient ischemic attack) 07/13/2018   Left-sided weakness 07/13/2018   Morbid obesity ([place]) 11/25/2013   HLD (hyperlipidemia) 11/25/2013   DM type 2 (diabetes mellitus, type 2) ([place]) 11/25/2013   Chest pain 11/04/2012    Past Surgical History:  Procedure Laterality Date   ROTATOR CUFF REPAIR  2009   TUBAL LIGATION  1979     OB History   No obstetric history on file.      Home Medications    Prior to Admission medications   Medication Sig Start Date End Date Taking? Authorizing Provider  aspirin EC 81 MG EC tablet Take 1 tablet (81 mg total) by mouth daily. 07/15/18   Geradine Girt, DO  atorvastatin (LIPITOR) 40 MG tablet Take 1 tablet (40 mg total) by mouth daily at 6 PM. 07/14/18   Geradine Girt, DO  clopidogrel (PLAVIX) 75 MG tablet Take 1 tablet (75 mg total) by mouth daily. 07/15/18   Geradine Girt, DO  glipiZIDE (GLUCOTROL) 10 MG tablet Take 10 mg by mouth 2 (two) times daily before a meal.   12/30/17   [provider]  Insulin Detemir (LEVEMIR) 100 UNIT/ML Pen Inject 10 Units into the skin daily. 07/14/18   Geradine Girt, DO  Insulin Pen Needle 32G X 4 MM MISC Use daily 07/14/18   Geradine Girt, DO  metFORMIN (GLUCOPHAGE) 1000 MG tablet Take 1,000 mg by mouth 2 (two) times daily with a meal.     [provider]    Family History Family History  Problem Relation Age of Onset   Diabetes Father    Heart attack Father 70   Heart disease Mother        hx cabg at 39   Heart attack Brother     Social History Social History   Tobacco Use   Smoking status: Never Smoker   Smokeless tobacco: Never Used  Substance Use Topics   Alcohol use: No   Drug use: No     Allergies   Augmentin [amoxicillin-pot clavulanate]; Definity [perflutren lipid microsphere]; Novocain [procaine]; and Promethazine   Review of Systems Review of Systems  All other systems reviewed and are negative.    Physical Exam Updated Vital Signs BP 132/71    Pulse 81    Temp 98.1 F (36.7 C)    Resp 18    SpO2 98%   Physical Exam Vitals signs and nursing note reviewed.  Constitutional:  General: She is not in acute distress.    Appearance: She is well-developed. She is not diaphoretic.  HENT:     Head: Normocephalic and atraumatic.  Neck:     Musculoskeletal: Normal range of motion and neck supple.  Cardiovascular:     Rate and Rhythm: Normal rate and regular rhythm.     Heart sounds: No murmur. No friction rub. No gallop.   Pulmonary:     Effort: Pulmonary effort is normal. No respiratory distress.     Breath sounds: Normal breath sounds. No wheezing.  Abdominal:     General: Bowel sounds are normal. There is no distension.     Palpations: Abdomen is soft.     Tenderness: There is no abdominal tenderness.  Musculoskeletal: Normal range of motion.  Skin:    General: Skin is warm and dry.  Neurological:     General: No focal deficit present.     Mental  Status: She is alert and oriented to person, place, and time.     Cranial Nerves: No cranial nerve deficit.     Motor: No weakness.     Coordination: Coordination normal.      ED Treatments / Results  Labs (all labs ordered are listed, but only abnormal results are displayed) Labs Reviewed  CBC - Abnormal; Notable for the following components:      Result Value   RBC 5.37 (*)    Hemoglobin 15.8 (*)    HCT 46.9 (*)    All other components within normal limits  COMPREHENSIVE METABOLIC PANEL - Abnormal; Notable for the following components:   Glucose, Bld 272 (*)    GFR calc non Af Amer 58 (*)    All other components within normal limits  I-STAT CHEM 8, ED - Abnormal; Notable for the following components:   Glucose, Bld 271 (*)    Calcium, Ion 1.11 (*)    Hemoglobin 15.3 (*)    All other components within normal limits  ETHANOL  PROTIME-INR  APTT  DIFFERENTIAL    EKG EKG Interpretation  Date/Time:  Wednesday July 14 2018 21:44:49 EDT Ventricular Rate:  85 PR Interval:  138 QRS Duration: 82 QT Interval:  374 QTC Calculation: 445 R Axis:   4 Text Interpretation:  Normal sinus rhythm Possible Anterior infarct , age undetermined Abnormal ECG Confirmed by Veryl Speak [phone]) on 07/14/2018 11:30:20 PM   Radiology Ct Angio Head W Or Wo Contrast  Result Date: 07/14/2018 CLINICAL DATA:  Left-sided numbness with tingling of the face and left arm EXAM: CT ANGIOGRAPHY HEAD AND NECK TECHNIQUE: Multidetector CT imaging of the head and neck was performed using the standard protocol during bolus administration of intravenous contrast. Multiplanar CT image reconstructions and MIPs were obtained to evaluate the vascular anatomy. Carotid stenosis measurements (when applicable) are obtained utilizing NASCET criteria, using the distal internal carotid diameter as the denominator. CONTRAST:  47mL OMNIPAQUE IOHEXOL 350 MG/ML SOLN COMPARISON:  Head CT 07/13/2018. FINDINGS: CT HEAD FINDINGS  Brain: There is no mass, hemorrhage or extra-axial collection. The size and configuration of the ventricles and extra-axial CSF spaces are normal. Unchanged hypodensity of the right centrum semiovale. This appears oriented perpendicularly to the long axis of the corpus callosum. The brain parenchyma is normal. Skull: The visualized skull base, calvarium and extracranial soft tissues are normal. Sinuses/Orbits: No fluid levels or advanced mucosal thickening of the visualized paranasal sinuses. No mastoid or middle ear effusion. The orbits are normal. CTA NECK FINDINGS SKELETON: There is no  bony spinal canal stenosis. No lytic or blastic lesion. OTHER NECK: Normal pharynx, larynx and major salivary glands. No cervical lymphadenopathy. Unremarkable thyroid gland. UPPER CHEST: No pneumothorax or pleural effusion. No nodules or masses. AORTIC ARCH: There is no calcific atherosclerosis of the aortic arch. There is no aneurysm, dissection or hemodynamically significant stenosis of the visualized ascending aorta and aortic arch. Conventional 3 vessel aortic branching pattern. The visualized proximal subclavian arteries are widely patent. RIGHT CAROTID SYSTEM: --Common carotid artery: Widely patent origin without common carotid artery dissection or aneurysm. --Internal carotid artery: No dissection, occlusion or aneurysm. There is mixed density atherosclerosis extending into the proximal ICA, resulting in less than 50% stenosis. --External carotid artery: No acute abnormality. LEFT CAROTID SYSTEM: --Common carotid artery: Widely patent origin without common carotid artery dissection or aneurysm. --Internal carotid artery: No dissection, occlusion or aneurysm. There is mixed density atherosclerosis extending into the proximal ICA, resulting in less than 50% stenosis. --External carotid artery: No acute abnormality. VERTEBRAL ARTERIES: Left dominant configuration. Both origins are clearly patent. No dissection, occlusion or  flow-limiting stenosis to the skull base (V1-V3 segments). CTA HEAD FINDINGS POSTERIOR CIRCULATION: --Vertebral arteries: Normal V4 segments. --Posterior inferior cerebellar arteries (PICA): The right PICA and AICA share a common origin. Normal origin of the left PICA from the ipsilateral vertebral artery. --Anterior inferior cerebellar arteries (AICA): Normal origin of the right AICA from the basilar artery. Left AICA not clearly visualized. --Basilar artery: Normal. --Superior cerebellar arteries: Normal. --Posterior cerebral arteries (PCA): Normal. The left PCA is predominantly supplied by the posterior communicating artery. ANTERIOR CIRCULATION: --Intracranial internal carotid arteries: Normal. --Anterior cerebral arteries (ACA): Normal. Both A1 segments are present. Patent anterior communicating artery (a-comm). --Middle cerebral arteries (MCA): Normal. VENOUS SINUSES: As permitted by contrast timing, patent. ANATOMIC VARIANTS: None Review of the MIP images confirms the above findings. IMPRESSION: 1. No intracranial hemorrhage or emergent large vessel occlusion. 2. Unchanged appearance of right frontal white matter lesion, perpendicular to the long axis of the lateral ventricles. While small vessel infarcts could have this appearance, the orientation of this lesion is concerning for demyelination. MRI of the brain with and without contrast is recommended to assess for multiple sclerosis. 3. Bilateral carotid bifurcation atherosclerosis with less than 50% stenosis. Electronically Signed   By: Ulyses Jarred M.D.   On: 07/14/2018 00:56   Ct Angio Neck W Or Wo Contrast  Result Date: 07/14/2018 CLINICAL DATA:  Left-sided numbness with tingling of the face and left arm EXAM: CT ANGIOGRAPHY HEAD AND NECK TECHNIQUE: Multidetector CT imaging of the head and neck was performed using the standard protocol during bolus administration of intravenous contrast. Multiplanar CT image reconstructions and MIPs were obtained to  evaluate the vascular anatomy. Carotid stenosis measurements (when applicable) are obtained utilizing NASCET criteria, using the distal internal carotid diameter as the denominator. CONTRAST:  49mL OMNIPAQUE IOHEXOL 350 MG/ML SOLN COMPARISON:  Head CT 07/13/2018. FINDINGS: CT HEAD FINDINGS Brain: There is no mass, hemorrhage or extra-axial collection. The size and configuration of the ventricles and extra-axial CSF spaces are normal. Unchanged hypodensity of the right centrum semiovale. This appears oriented perpendicularly to the long axis of the corpus callosum. The brain parenchyma is normal. Skull: The visualized skull base, calvarium and extracranial soft tissues are normal. Sinuses/Orbits: No fluid levels or advanced mucosal thickening of the visualized paranasal sinuses. No mastoid or middle ear effusion. The orbits are normal. CTA NECK FINDINGS SKELETON: There is no bony spinal canal stenosis. No lytic or blastic  lesion. OTHER NECK: Normal pharynx, larynx and major salivary glands. No cervical lymphadenopathy. Unremarkable thyroid gland. UPPER CHEST: No pneumothorax or pleural effusion. No nodules or masses. AORTIC ARCH: There is no calcific atherosclerosis of the aortic arch. There is no aneurysm, dissection or hemodynamically significant stenosis of the visualized ascending aorta and aortic arch. Conventional 3 vessel aortic branching pattern. The visualized proximal subclavian arteries are widely patent. RIGHT CAROTID SYSTEM: --Common carotid artery: Widely patent origin without common carotid artery dissection or aneurysm. --Internal carotid artery: No dissection, occlusion or aneurysm. There is mixed density atherosclerosis extending into the proximal ICA, resulting in less than 50% stenosis. --External carotid artery: No acute abnormality. LEFT CAROTID SYSTEM: --Common carotid artery: Widely patent origin without common carotid artery dissection or aneurysm. --Internal carotid artery: No dissection,  occlusion or aneurysm. There is mixed density atherosclerosis extending into the proximal ICA, resulting in less than 50% stenosis. --External carotid artery: No acute abnormality. VERTEBRAL ARTERIES: Left dominant configuration. Both origins are clearly patent. No dissection, occlusion or flow-limiting stenosis to the skull base (V1-V3 segments). CTA HEAD FINDINGS POSTERIOR CIRCULATION: --Vertebral arteries: Normal V4 segments. --Posterior inferior cerebellar arteries (PICA): The right PICA and AICA share a common origin. Normal origin of the left PICA from the ipsilateral vertebral artery. --Anterior inferior cerebellar arteries (AICA): Normal origin of the right AICA from the basilar artery. Left AICA not clearly visualized. --Basilar artery: Normal. --Superior cerebellar arteries: Normal. --Posterior cerebral arteries (PCA): Normal. The left PCA is predominantly supplied by the posterior communicating artery. ANTERIOR CIRCULATION: --Intracranial internal carotid arteries: Normal. --Anterior cerebral arteries (ACA): Normal. Both A1 segments are present. Patent anterior communicating artery (a-comm). --Middle cerebral arteries (MCA): Normal. VENOUS SINUSES: As permitted by contrast timing, patent. ANATOMIC VARIANTS: None Review of the MIP images confirms the above findings. IMPRESSION: 1. No intracranial hemorrhage or emergent large vessel occlusion. 2. Unchanged appearance of right frontal white matter lesion, perpendicular to the long axis of the lateral ventricles. While small vessel infarcts could have this appearance, the orientation of this lesion is concerning for demyelination. MRI of the brain with and without contrast is recommended to assess for multiple sclerosis. 3. Bilateral carotid bifurcation atherosclerosis with less than 50% stenosis. Electronically Signed   By: Ulyses Jarred M.D.   On: 07/14/2018 00:56   Mr Brain Wo Contrast  Result Date: 07/14/2018 CLINICAL DATA:  TIA. Intermittent  left-sided numbness and tingling in the face and arm EXAM: MRI HEAD WITHOUT CONTRAST TECHNIQUE: Multiplanar, multiecho pulse sequences of the brain and surrounding structures were obtained without intravenous contrast. COMPARISON:  CT and CTA from yesterday FINDINGS: Brain: No acute infarction, hemorrhage, hydrocephalus, extra-axial collection or mass lesion. DWI high intensity in the left frontal white matter is shine through based on the other sequences. There are lacunes in the bifrontal white matter, more numerous on the right, attributed to old microvascular ischemic insults given patient's vascular risk factors. Vascular: Major flow voids are preserved Skull and upper cervical spine: Negative for marrow lesion Sinuses/Orbits: Negative IMPRESSION: 1. No emergent finding. 2. Small remote lacunar infarcts in the deep cerebral white matter. Electronically Signed   By: Monte Fantasia M.D.   On: 07/14/2018 09:26   Ct Head Code Stroke Wo Contrast  Result Date: 07/14/2018 CLINICAL DATA:  Code stroke. Left-sided numbness and weakness. Last seen normal 20:30 EXAM: CT HEAD WITHOUT CONTRAST TECHNIQUE: Contiguous axial images were obtained from the base of the skull through the vertex without intravenous contrast. COMPARISON:  Head CT 07/14/2018 at  12:23 a.m. FINDINGS: Brain: There is no mass, hemorrhage or extra-axial collection. The size and configuration of the ventricles and extra-axial CSF spaces are normal. Unchanged hypodense foci within the right frontal white matter. Vascular: No abnormal hyperdensity of the major intracranial arteries or dural venous sinuses. No intracranial atherosclerosis. Skull: The visualized skull base, calvarium and extracranial soft tissues are normal. Sinuses/Orbits: No fluid levels or advanced mucosal thickening of the visualized paranasal sinuses. No mastoid or middle ear effusion. The orbits are normal. ASPECTS [facility] Stroke Program Early CT Score) - Ganglionic level infarction  (caudate, lentiform nuclei, internal capsule, insula, M1-M3 cortex): 7 - Supraganglionic infarction (M4-M6 cortex): 3 Total score (0-10 with 10 being normal): 10 IMPRESSION: 1. Unchanged examination without acute abnormality. 2. ASPECTS is 10. These results were communicated to Dr. Amie Portland at 10:09 pm on 07/14/2018 by text page via the [facility] messaging system. Electronically Signed   By: Ulyses Jarred M.D.   On: 07/14/2018 22:11   Ct Head Code Stroke Wo Contrast  Addendum Date: 07/13/2018   ADDENDUM REPORT: 07/13/2018 14:24 ADDENDUM: Study discussed by telephone with Dr. Aletta Edouard on 07/13/2018 at 1421 hours. Electronically Signed   By: Genevie Ann M.D.   On: 07/13/2018 14:24   Result Date: 07/13/2018 CLINICAL DATA:  Code stroke. 68 year old female with numbness in the left face and arm for 3 days with associated arm heaviness today. EXAM: CT HEAD WITHOUT CONTRAST TECHNIQUE: Contiguous axial images were obtained from the base of the skull through the vertex without intravenous contrast. COMPARISON:  None. FINDINGS: Brain: No midline shift, mass effect, or evidence of intracranial mass lesion. No ventriculomegaly. No acute intracranial hemorrhage identified. Patchy white matter hypodensity in right centrum semiovale (series 2, image 21). No superimposed cortically based acute infarct identified. And gray-white matter differentiation elsewhere is within normal limits. No cortical encephalomalacia identified. Vascular: Mild Calcified atherosclerosis at the skull base. No suspicious intracranial vascular hyperdensity. Skull: Negative. Sinuses/Orbits: Visualized paranasal sinuses and mastoids are clear. Other: Visualized orbits and scalp soft tissues are within normal limits. ASPECTS [facility] Stroke Program Early CT Score) - Ganglionic level infarction (caudate, lentiform nuclei, internal capsule, insula, M1-M3 cortex): 7 - Supraganglionic infarction (M4-M6 cortex): 3 Total score (0-10 with 10 being normal): 10  IMPRESSION: 1. Age indeterminate white matter small vessel disease type changes in the right centrum semiovale. 2. No superimposed acute cortically based infarct or acute intracranial hemorrhage identified. 3. ASPECTS is 10. Electronically Signed: By: Genevie Ann M.D. On: 07/13/2018 14:16    Procedures Procedures (including critical care time)  Medications Ordered in ED Medications  metoCLOPramide (REGLAN) injection 10 mg (has no administration in time range)  ketorolac (TORADOL) 15 MG/ML injection 15 mg (15 mg Intravenous Given 07/14/18 2238)  diphenhydrAMINE (BENADRYL) injection 12.5 mg (12.5 mg Intravenous Given 07/14/18 2236)     Initial Impression / Assessment and Plan / ED Course  I have reviewed the triage vital signs and the nursing notes.  Pertinent labs & imaging results that were available during my care of the patient were reviewed by me and considered in my medical decision making (see chart for details).  Patient arrived to the ER as a code stroke.  She was seen immediately upon arrival, then went to radiology for a head CT.  This was performed and was unremarkable.  Laboratory studies are unchanged from previous admission.  Patient seen by Dr. Rory Percy from neurology.  It is his opinion that these symptoms are likely a complex migraine as no  other cause has been found on her head CT tonight and work-up from yesterday.  Medications given in the ER and patient will be discharged with Topamax per neurology recommendations.  Final Clinical Impressions(s) / ED Diagnoses   Final diagnoses:  None    ED Discharge Orders    None       Veryl Speak, MD 07/14/18 [phone]

## 2018-07-14 NOTE — Evaluation (Addendum)
Occupational Therapy Evaluation and Discharge Patient Details Name: Jacqueline [name] MRN: [id number] DOB: 1950-08-28 Today's Date: 07/14/2018    History of Present Illness Pt is a 68 y/o female presenting with 3 day hx of intermittent L sided numbness, weakness and slurred specech. CT negative. MRI pending.  PMH: hypertension, HLD, history of amaurosis fugax years ago (sudden vision loss of both eyes, reports no work up and no recurrent episodes) obesity, type 2 diabetes.    Clinical Impression   PTA patient independent.  Admitted for above and presenting at independent level for transfers, mobility, and self care tasks.  Strength, sensation, cognition, vision and coordination appears normal.  Educated on BE FAST.  No further OT needs have been identified at this time.  OT signing off.     Follow Up Recommendations  No OT follow up    Equipment Recommendations  None recommended by OT    Recommendations for Other Services       Precautions / Restrictions Restrictions Weight Bearing Restrictions: No      Mobility Bed Mobility Overal bed mobility: Independent                Transfers Overall transfer level: Independent                    Balance Overall balance assessment: No apparent balance deficits (not formally assessed)                                         ADL either performed or assessed with clinical judgement   ADL Overall ADL's : Independent                                             Vision Baseline Vision/History: Wears glasses Wears Glasses: Reading only Patient Visual Report: No change from baseline Vision Assessment?: No apparent visual deficits     Perception     Praxis      Pertinent Vitals/Pain Pain Assessment: No/denies pain     Hand Dominance Right   Extremity/Trunk Assessment Upper Extremity Assessment Upper Extremity Assessment: Overall WFL for tasks assessed   Lower Extremity  Assessment Lower Extremity Assessment: Defer to PT evaluation   Cervical / Trunk Assessment Cervical / Trunk Assessment: Normal   Communication Communication Communication: No difficulties   Cognition Arousal/Alertness: Awake/alert Behavior During Therapy: WFL for tasks assessed/performed Overall Cognitive Status: Within Functional Limits for tasks assessed                                     General Comments       Exercises     Shoulder Instructions      Home Living Family/patient expects to be discharged to:: Private residence Living Arrangements: Spouse/significant other Available Help at Discharge: Family;Available 24 hours/day Type of Home: House Home Access: Level entry     Home Layout: One level;Other (Comment);Laundry or work area in basement(1 step to [employer] )     Bathroom Shower/Tub: Occupational psychologist: None          Prior Functioning/Environment Level of Independence: Independent        Comments: takes  care of 69 y/o grandson 3 days/week        OT Problem List:        OT Treatment/Interventions:      OT Goals(Current goals can be found in the care plan section) Acute Rehab OT Goals Patient Stated Goal: to get home  OT Goal Formulation: With patient  OT Frequency:     Barriers to D/C:            Co-evaluation              AM-PAC OT "6 Clicks" Daily Activity     Outcome Measure Help from another person eating meals?: None Help from another person taking care of personal grooming?: None Help from another person toileting, which includes using toliet, bedpan, or urinal?: None Help from another person bathing (including washing, rinsing, drying)?: None Help from another person to put on and taking off regular upper body clothing?: None Help from another person to put on and taking off regular lower body clothing?: None 6 Click Score: 24   End of Session Equipment  Utilized During Treatment: Gait belt Nurse Communication: Mobility status  Activity Tolerance: Patient tolerated treatment well Patient left: in chair;with call bell/phone within reach  OT Visit Diagnosis: Muscle weakness (generalized) (M62.81)                Time: 5830-9407 OT Time Calculation (min): 26 min Charges:  OT General Charges $OT Visit: 1 Visit OT Evaluation $OT Eval Low Complexity: 1 Low  Delight Stare, OT Acute Rehabilitation Services Pager [phone] Office [phone]   Delight Stare 07/14/2018, 11:54 AM

## 2018-07-14 NOTE — ED Notes (Signed)
Pt sleepy from med given  For her migraine headache .  Pt was worked up for stgroke less than  24 hours ago

## 2018-07-14 NOTE — Progress Notes (Signed)
  Echocardiogram 2D Echocardiogram has been performed.  Declan Adamson L Androw 07/14/2018, 9:29 AM

## 2018-07-14 NOTE — ED Notes (Signed)
Code stroke called at 2150  Called off at 2214 by neurologist

## 2018-07-14 NOTE — Consult Note (Signed)
Neurology Consultation  Reason for Consult: code stroke Referring Physician: Dr Stark Jock  CC: Left-sided numbness  History is obtained from: Patient, chart  HPI: Jacqueline [name] is a 68 y.o. female past medical history of hypertension, hyperlipidemia, obesity, diabetes, discharged this afternoon with a diagnosis of possible TIA/complex migraine for left-sided tingling and numbness and left questionable facial pulling up-not droop, presented again with similar symptoms that started sometime around 8:30 PM. She was evaluated in the emergency room by the ED provider and a code stroke activated for focal symptoms. She reports having had a headache almost every time but similar symptoms either before or after symptoms.  She now reports about a months worth of headaches.  She also reports some heightened anxiety due to some recent stressors. She had been seen by telemedicine neurology and Dr. Rosalin Hawking from stroke neurology today and recommended dual antiplatelet for 3 weeks followed by aspirin alone after that along with statin and outpatient neurology follow-up.    LKW: For today's episode-8:30 PM today but has been having similar symptoms for many days. tpa given?: no, NIH 0 Premorbid modified Rankin scale (mRS): 0 NIH stroke scale-0   ROS: ROS was performed and is negative except as noted in the HPI.    Past Medical History:  Diagnosis Date  . Diabetes mellitus   . HLD (hyperlipidemia)   . Hypertension   . Nephrolithiasis   . Obese     Family History  Problem Relation Age of Onset  . Diabetes Father   . Heart attack Father 59  . Heart disease Mother        hx cabg at 85  . Heart attack Brother    Social History:   reports that she has never smoked. She has never used smokeless tobacco. She reports that she does not drink alcohol or use drugs.  Medications  Current Facility-Administered Medications:  .  diphenhydrAMINE (BENADRYL) injection 12.5 mg, 12.5 mg, Intravenous, Once,  Amie Portland, MD .  ketorolac (TORADOL) 15 MG/ML injection 15 mg, 15 mg, Intravenous, Once, Amie Portland, MD .  metoCLOPramide (REGLAN) injection 10 mg, 10 mg, Intravenous, Once, Amie Portland, MD  Current Outpatient Medications:  .  [START ON 07/15/2018] aspirin EC 81 MG EC tablet, Take 1 tablet (81 mg total) by mouth daily., Disp: 21 tablet, Rfl: 0 .  atorvastatin (LIPITOR) 40 MG tablet, Take 1 tablet (40 mg total) by mouth daily at 6 PM., Disp: 30 tablet, Rfl: 0 .  [START ON 07/15/2018] clopidogrel (PLAVIX) 75 MG tablet, Take 1 tablet (75 mg total) by mouth daily., Disp: 30 tablet, Rfl: 0 .  glipiZIDE (GLUCOTROL) 10 MG tablet, Take 10 mg by mouth 2 (two) times daily before a meal. , Disp: , Rfl:  .  Insulin Detemir (LEVEMIR) 100 UNIT/ML Pen, Inject 10 Units into the skin daily., Disp: 10 mL, Rfl: 0 .  Insulin Pen Needle 32G X 4 MM MISC, Use daily, Disp: 30 each, Rfl: 0 .  metFORMIN (GLUCOPHAGE) 1000 MG tablet, Take 1,000 mg by mouth 2 (two) times daily with a meal. , Disp: , Rfl:   Exam: Current vital signs: BP (!) 147/90 (BP Location: Left Arm)   Pulse (!) 101   Temp 97.9 F (36.6 C) (Oral)   Resp 18   SpO2 96%  Vital signs in last 24 hours: Temp:  [97.8 F (36.6 C)-99.1 F (37.3 C)] 97.9 F (36.6 C) (06/03 2152) Pulse Rate:  [63-101] 101 (06/03 2152) Resp:  [16-20] 18 (06/03  2152) BP: (122-151)/(64-90) 147/90 (06/03 2152) SpO2:  [92 %-99 %] 96 % (06/03 2152) GENERAL: Awake, alert in NAD HEENT: - Normocephalic and atraumatic, dry mm, no LN++, no Thyromegally LUNGS - Clear to auscultation bilaterally with no wheezes CV - S1S2 RRR, no m/r/g, equal pulses bilaterally. ABDOMEN - Soft, nontender, nondistended with normoactive BS Ext: warm, well perfused, intact peripheral pulses, no edema  NEURO:  Mental Status: AA&Ox3  Language: speech is non-dysarthric.  Naming, repetition, fluency, and comprehension intact. Cranial Nerves: PERRL. EOMI, visual fields full, no facial asymmetry,  facial sensation intact, hearing intact, tongue/uvula/soft palate midline, normal sternocleidomastoid and trapezius muscle strength. No evidence of tongue atrophy or fibrillations Motor: 5/5 with no drift in all fours Tone: is normal and bulk is normal Sensation- Intact to light touch bilaterally Coordination: FTN intact bilaterally, no ataxia in BLE. Gait-normal  Labs I have reviewed labs in epic and the results pertinent to this consultation are:  CBC    Component Value Date/Time   WBC 6.7 07/14/2018 2151   RBC 5.37 (H) 07/14/2018 2151   HGB 15.3 (H) 07/14/2018 2157   HCT 45.0 07/14/2018 2157   PLT 232 07/14/2018 2151   MCV 87.3 07/14/2018 2151   MCH 29.4 07/14/2018 2151   MCHC 33.7 07/14/2018 2151   RDW 12.1 07/14/2018 2151   LYMPHSABS 2.0 07/14/2018 2151   MONOABS 0.6 07/14/2018 2151   EOSABS 0.2 07/14/2018 2151   BASOSABS 0.0 07/14/2018 2151    CMP     Component Value Date/Time   NA 138 07/14/2018 2157   K 4.1 07/14/2018 2157   CL 101 07/14/2018 2157   CO2 27 07/14/2018 0222   GLUCOSE 271 (H) 07/14/2018 2157   BUN 19 07/14/2018 2157   CREATININE 0.90 07/14/2018 2157   CALCIUM 9.4 07/14/2018 0222   PROT 7.1 07/13/2018 1357   ALBUMIN 4.2 07/13/2018 1357   AST 20 07/13/2018 1357   ALT 20 07/13/2018 1357   ALKPHOS 77 07/13/2018 1357   BILITOT 0.8 07/13/2018 1357   GFRNONAA >60 07/14/2018 0222   GFRAA >60 07/14/2018 0222   Imaging I have reviewed the images obtained:  CT-scan of the brain-no acute changes.  Aspects 10. MRI brain from earlier this morning with no emergent finding, small remote lacunar infarcts in the deep cerebral white matter bilaterally.  Assessment:  68 year old woman evaluated for TIA versus complex migraine coming in with similar symptoms of left-sided numbness and weakness that she has been having off and on now for many days. Examination unremarkable for any acute process-NIH 0. CT head-no bleed, no acute changes, aspects 10. She has  received a complete stroke/TIA risk factor work-up. Based on her history, this sounds more like a complex migraine rather than a stroke/TIA event. There is no need to repeat MRI imaging as an MRI was just done earlier this morning.  Impression: Complex migraine Recent TIA  Recommendations: For the recent TIA: Continue dual antiplatelet Continue statin Follow-up outpatient neurology  For the complex migraine: Ordered IV Toradol/Reglan/Benadryl migraine cocktail. Observe for a couple of hours in the ED Start Topamax-Topamax 25 mg nightly for 1 week followed by 25 mg twice daily for 1 week followed by 25 mg every morning and 50 mg nightly for 1 week followed by 50 mg twice daily goal dose. Discussed in detail the effects and side effects of Topamax including increased risk of kidney stones if dehydrated and encouraged to remain adequately hydrated while on Topamax. Follow-up outpatient neurology  Detailed discussion with  the patient's husband outside the emergency room who was waiting in his car.  Relayed the plan to ED provider Dr. Stark Jock  Please call neurology with questions  -- Amie Portland, MD [facility] Pager: [phone] If 7pm to 7am, please call on call as listed on AMION.

## 2018-09-01 ENCOUNTER — Other Ambulatory Visit: Payer: Self-pay

## 2018-09-01 ENCOUNTER — Ambulatory Visit (INDEPENDENT_AMBULATORY_CARE_PROVIDER_SITE_OTHER): Payer: Medicare HMO | Admitting: Adult Health

## 2018-09-01 ENCOUNTER — Encounter: Payer: Self-pay | Admitting: Adult Health

## 2018-09-01 VITALS — BP 147/77 | HR 65 | Temp 98.0°F | Ht 65.0 in | Wt 208.2 lb

## 2018-09-01 DIAGNOSIS — E785 Hyperlipidemia, unspecified: Secondary | ICD-10-CM | POA: Diagnosis not present

## 2018-09-01 DIAGNOSIS — G43119 Migraine with aura, intractable, without status migrainosus: Secondary | ICD-10-CM

## 2018-09-01 DIAGNOSIS — I10 Essential (primary) hypertension: Secondary | ICD-10-CM | POA: Diagnosis not present

## 2018-09-01 DIAGNOSIS — E1159 Type 2 diabetes mellitus with other circulatory complications: Secondary | ICD-10-CM | POA: Diagnosis not present

## 2018-09-01 MED ORDER — TOPIRAMATE 50 MG PO TABS: 50.0000 mg | ORAL_TABLET | Freq: Two times a day (BID) | ORAL | 2 refills | Status: DC

## 2018-09-01 MED ORDER — ATORVASTATIN CALCIUM 40 MG PO TABS: 40.0000 mg | ORAL_TABLET | Freq: Every day | ORAL | 3 refills | Status: DC

## 2018-09-01 NOTE — Progress Notes (Signed)
[facility] [address] [place]. [place] [address] [phone]       [place] NOTE  Ms. [name] Date of Birth:  02/01/1951 Medical Record Number:  [id number]   Reason for Referral:  hospital stroke follow up    CHIEF COMPLAINT:  Chief Complaint  Patient presents with   Hospitalization Follow-up    TIA f/u. Husband present. Rm 9. Patient mentioned that a couple weeks ago she had some numbness to her nose and upper lip that lasted 24 hours.    HPI: Jacqueline [name] being seen today for in office hospital follow-up regarding TIA versus complicated migraine on 04/10/5398.  History obtained from patient, husband and chart review. Reviewed all radiology images and labs personally.  CHANEY [name] is an 68 y.o. Caucasian female with PMH of hypertension, hyperlipidemia, obesity and diabetes who presented to [facility] on 07/14/2018 with 3 episode of left arm and face numbness tingling for the last 3 days.  Initial episode lasting only 3 minutes and then resolved but followed by headache with bifrontal throbbing was lasted approximately 3 minutes.  She attributed this to empty stomach.  Second episode occurred the next day lasting approximately 3 minutes and then accompanied by headache lasting approximately 3 minutes which occurred prior to lunch.  Third episode occurred while eating lunch with similar symptoms plus feeling heaviness of left arm which lasted about 5 minutes followed by mild brief headache.  Denies any prior history of migraine or headaches.  She does endorse having visual auras with flashing lights in the past.  Stroke work-up unremarkable and likely right brain TIA given risk factors versus complicated migraine due to headache following each episode.  Stroke work-up completed once transferred to [facility] ED.  MRI no acute stroke but did show small deep white matter infarcts.  Vessel imaging and 2D echo unremarkable.  LDL 119 and A1c 11.2.   Recommended DAPT for 3 weeks and aspirin alone.  HTN stable.  Initiated atorvastatin 40 mg daily for HLD management.  Recommended close PCP follow-up for uncontrolled DM.  Other stroke risk factors include advanced age, obesity and one episode of prior vision loss lasting 1 to 2 seconds approximately 3 to 4 years ago.  As all symptoms resolved without residual deficit, discharged home in stable condition without therapy needs. She was discharged in the afternoon on 07/14/2018 but returned to [facility] ED that night due to recurrence of numbness in left shoulder and headache.  CT head unremarkable.  Was consulted by neurology and felt her symptoms were likely related to complex migraine as extensive stroke work-up unremarkable.  Discharged on Topamax and recommend follow-up with neurology outpatient.  Since discharge, she has experienced 2 additional headaches which were preceded by left arm numbness/tingling.  Symptoms similar to prior episodes.  She has continued on Topamax 50 mg nightly with only minimal daytime fatigue but did not limit her overall functioning.   She does endorse approximately 2 weeks prior, she woke up in the morning with tingling on her nose and swelling of top lip.  She took Benadryl throughout the day and resolved within 24 hours.  Symptoms consistent with allergic reaction to unknown modality as she did not change medications at that time, denies eating a different type of food or use of lip product.  Completed 3 weeks DAPT continues on aspirin alone without bleeding or bruising Previously taking atorvastatin 40 mg daily without side effects myalgias but has since run out  of prescription Blood pressure 147/77 -does not routinely monitor at home Glucose levels uncontrolled and is in the process of finding new PCP versus establishing care with endocrinology due to continued difficulty with diabetic management No further concerns at this time    ROS:   14 system review of systems  performed and negative with exception of headache  PMH:  Past Medical History:  Diagnosis Date   Diabetes mellitus    HLD (hyperlipidemia)    Hypertension    Nephrolithiasis    Obese     PSH:  Past Surgical History:  Procedure Laterality Date   ROTATOR CUFF REPAIR  2009   TUBAL LIGATION  1979    Social History:  Social History   Socioeconomic History   Marital status: Married    Spouse name: Not on file   Number of children: 4   Years of education: Not on file   Highest education level: Not on file  Occupational History   Occupation: Retired-Missionary  Scientist, product/process development strain: Not on file   Food insecurity    Worry: Not on file    Inability: Not on file   Transportation needs    Medical: Not on file    Non-medical: Not on file  Tobacco Use   Smoking status: Never Smoker   Smokeless tobacco: Never Used  Substance and Sexual Activity   Alcohol use: No   Drug use: No   Sexual activity: Not on file  Lifestyle   Physical activity    Days per week: Not on file    Minutes per session: Not on file   Stress: Not on file  Relationships   Social connections    Talks on phone: Not on file    Gets together: Not on file    Attends religious service: Not on file    Active member of club or organization: Not on file    Attends meetings of clubs or organizations: Not on file    Relationship status: Not on file   Intimate partner violence    Fear of current or ex partner: Not on file    Emotionally abused: Not on file    Physically abused: Not on file    Forced sexual activity: Not on file  Other Topics Concern   Not on file  Social History Narrative   Not on file    Family History:  Family History  Problem Relation Age of Onset   Diabetes Father    Heart attack Father 16   Heart disease Mother        hx cabg at 29   Heart attack Brother     Medications:   Current Outpatient Medications on File Prior to  Visit  Medication Sig Dispense Refill   aspirin EC 81 MG EC tablet Take 1 tablet (81 mg total) by mouth daily. 21 tablet 0   glipiZIDE (GLUCOTROL) 10 MG tablet Take 10 mg by mouth 2 (two) times daily before a meal.      Insulin Detemir (LEVEMIR) 100 UNIT/ML Pen Inject 10 Units into the skin daily. 10 mL 0   Insulin Pen Needle 32G X 4 MM MISC Use daily 30 each 0   metFORMIN (GLUCOPHAGE) 1000 MG tablet Take 1,000 mg by mouth 2 (two) times daily with a meal.      atorvastatin (LIPITOR) 40 MG tablet Take 1 tablet (40 mg total) by mouth daily at 6 PM. (Patient not taking: Reported on 09/01/2018) 30 tablet 0  clopidogrel (PLAVIX) 75 MG tablet Take 1 tablet (75 mg total) by mouth daily. (Patient not taking: Reported on 09/01/2018) 30 tablet 0   topiramate (TOPAMAX) 25 MG tablet Take 25 mg every night for 1 week, then 25 mg twice daily for 1 week, then 25 mg in the morning and 50 mg at night for 1 week.  Begin taking 50 mg twice daily thereafter. (Patient not taking: Reported on 09/01/2018) 120 tablet 0   No current facility-administered medications on file prior to visit.     Allergies:   Allergies  Allergen Reactions   Augmentin [Amoxicillin-Pot Clavulanate] Other (See Comments)    Altered mental status Did it involve swelling of the face/tongue/throat, SOB, or low BP? Unknown Did it involve sudden or severe rash/hives, skin peeling, or any reaction on the inside of your mouth or nose? Unknown Did you need to seek medical attention at a hospital or doctor's office? Unknown When did it last happen? unk If all above answers are NO, may proceed with cephalosporin use.    Definity [Perflutren Lipid Microsphere] Other (See Comments)    Headache and facial flushing after Definity IVP given. No SOB or Chest pain,and Vital signs stable. Irven Baltimore, RN.   Novocain [Procaine] Other (See Comments)    Altered mental status   Promethazine Swelling    Tongue swelling     Physical  Exam  Vitals:   09/01/18 0857  BP: (!) 147/77  Pulse: 65  Temp: 98 F (36.7 C)  TempSrc: Oral  Weight: 208 lb 3.2 oz (94.4 kg)  Height: 5\' 5"  (1.651 m)   Body mass index is 34.65 kg/m. No exam data present  No flowsheet data found.   General: well developed, well nourished,  pleasant middle-age Caucasian female, seated, in no evident distress Head: head normocephalic and atraumatic.   Neck: supple with no carotid or supraclavicular bruits Cardiovascular: regular rate and rhythm, no murmurs Musculoskeletal: no deformity Skin:  no rash/petichiae Vascular:  Normal pulses all extremities   Neurologic Exam Mental Status: Awake and fully alert. Oriented to place and time. Recent and remote memory intact. Attention span, concentration and fund of knowledge appropriate. Mood and affect appropriate.  Cranial Nerves: Fundoscopic exam reveals sharp disc margins. Pupils equal, briskly reactive to light. Extraocular movements full without nystagmus. Visual fields full to confrontation. Hearing intact. Facial sensation intact. Face, tongue, palate moves normally and symmetrically.  Motor: Normal bulk and tone. Normal strength in all tested extremity muscles. Sensory.: intact to touch , pinprick , position and vibratory sensation.  Coordination: Rapid alternating movements normal in all extremities. Finger-to-nose and heel-to-shin performed accurately bilaterally. Gait and Station: Arises from chair without difficulty. Stance is normal. Gait demonstrates normal stride length and balance Reflexes: 1+ and symmetric. Toes downgoing.     NIHSS  0 Modified Rankin  0    Diagnostic Data (Labs, Imaging, Testing)  CT HEAD WO CONTRAST 07/13/2018 IMPRESSION: 1. Age indeterminate white matter small vessel disease type changes in the right centrum semiovale. 2. No superimposed acute cortically based infarct or acute intracranial hemorrhage identified. 3. ASPECTS is 10.  CT ANGIO HEAD W OR WO  CONTRAST CT ANGIO NECK W OR WO CONTRAST 07/14/2018 IMPRESSION: 1. No intracranial hemorrhage or emergent large vessel occlusion. 2. Unchanged appearance of right frontal white matter lesion, perpendicular to the long axis of the lateral ventricles. While small vessel infarcts could have this appearance, the orientation of this lesion is concerning for demyelination. MRI of the brain with and  without contrast is recommended to assess for multiple sclerosis. 3. Bilateral carotid bifurcation atherosclerosis with less than 50% stenosis.  MR BRAIN WO CONTRAST 07/14/2018 IMPRESSION: 1. No emergent finding. 2. Small remote lacunar infarcts in the deep cerebral white matter.  ECHOCARDIOGRAM 07/14/2018 IMPRESSIONS  1. The left ventricle has normal systolic function with an ejection fraction of 60-65%. The cavity size was normal. Left ventricular diastolic Doppler parameters are consistent with impaired relaxation. No evidence of left ventricular regional wall  motion abnormalities.  2. The right ventricle has normal systolic function. The cavity was normal. There is no increase in right ventricular wall thickness. Right ventricular systolic pressure could not be assessed.     ASSESSMENT: Jacqueline [name] is a 68 y.o. year old female here with right brain TIA given risk factors versus complicated migraine on 05/17/8293 after presenting with 3 episodes of intermittent numbness/tingling in left arm and face x3 days followed by headache.  Return to ED the evening after discharge due to return of symptoms with diagnosis of likely complicated migraine with Topamax initiated.  Vascular risk factors include HTN, HLD, uncontrolled DM, obesity and prior stroke on imaging.  Since recent discharge, she has experienced 2 additional episodes consisting of same symptoms.    PLAN:  1. ?  Right brain TIA: Continue aspirin 81 mg daily  and restart atorvastatin for secondary stroke prevention. Maintain strict control  of hypertension with blood pressure goal below 130/90, diabetes with hemoglobin A1c goal below 6.5% and cholesterol with LDL cholesterol (bad cholesterol) goal below 70 mg/dL.  I also advised the patient to eat a healthy diet with plenty of whole grains, cereals, fruits and vegetables, exercise regularly with at least 30 minutes of continuous activity daily and maintain ideal body weight. 2. ?  Complicated migraine: Continue Topamax 50 mg twice daily.  Discussion regarding complicated migraines and ongoing attempts at preventing 3. HTN: Advised to continue current treatment regimen.  Today's BP 147/77.  Advised to monitor at home along with continued follow-up with PCP for management 4. HLD: Advised to continue current treatment regimen along with continued follow-up with PCP for future prescribing and monitoring of lipid panel.  Lipid panel will be obtained to ensure improvement of LDL with initiation of atorvastatin.  Refill for atorvastatin placed but request ongoing prescribed by PCP 5. DMII: Referral to endocrinology due to continued uncontrolled glucose levels.  Discussion regarding importance of glucose management in regards to secondary stroke prevention    Follow up in 2 months or call earlier if needed   Greater than 50% of time during this 45 minute visit was spent on counseling, explanation of diagnosis of TIA versus complicated migraine, reviewing risk factor management of HTN, HLD and DM, planning of further management along with potential future management, discussion regarding likelihood of symptoms being related to complicated migraine but is also important to ensure ongoing stroke prevention and discussion with patient and family answering all questions.    Venancio Poisson, AGNP-BC  [facility] [address] [place], [place] [address]  Phone [phone] Fax [phone] Note: This document was prepared with digital dictation and possible  smart phrase technology. Any transcriptional errors that result from this process are unintentional.

## 2018-09-01 NOTE — Patient Instructions (Addendum)
Continue aspirin 81 mg daily  and restart lipitor  for secondary stroke prevention  Restart topamax 50mg  twice daily for migraine prevention  Referral placed to endocrinology for diabetic management  We will check cholesterol levels today   Continue to follow up with PCP regarding cholesterol, blood pressure and diabetes management   Continue to monitor blood pressure at home  Maintain strict control of hypertension with blood pressure goal below 130/90, diabetes with hemoglobin A1c goal below 6.5% and cholesterol with LDL cholesterol (bad cholesterol) goal below 70 mg/dL. I also advised the patient to eat a healthy diet with plenty of whole grains, cereals, fruits and vegetables, exercise regularly and maintain ideal body weight.  Followup in the future with me in 2 months or call earlier if needed       Thank you for coming to see [place] at [facility]. I hope we have been able to provide you high quality care today.  You may receive a patient satisfaction survey over the next few weeks. We would appreciate your feedback and comments so that we may continue to improve ourselves and the health of our patients.

## 2018-09-01 NOTE — Progress Notes (Signed)
I agree with the above plan 

## 2018-09-02 ENCOUNTER — Telehealth: Payer: Self-pay | Admitting: *Deleted

## 2018-09-02 DIAGNOSIS — E785 Hyperlipidemia, unspecified: Secondary | ICD-10-CM

## 2018-09-02 LAB — LIPID PANEL
Chol/HDL Ratio: 4.5 ratio — ABNORMAL HIGH (ref 0.0–4.4)
Cholesterol, Total: 203 mg/dL — ABNORMAL HIGH (ref 100–199)
HDL: 45 mg/dL (ref >39)
LDL Calculated: 126 mg/dL — ABNORMAL HIGH (ref 0–99)
Triglycerides: 161 mg/dL — ABNORMAL HIGH (ref 0–149)
VLDL Cholesterol Cal: 32 mg/dL (ref 5–40)

## 2018-09-02 MED ORDER — ATORVASTATIN CALCIUM 80 MG PO TABS: 40.0000 mg | ORAL_TABLET | Freq: Every day | ORAL | 3 refills | Status: DC

## 2018-09-02 NOTE — Telephone Encounter (Signed)
Order placed.  Thank you for assisting.

## 2018-09-02 NOTE — Telephone Encounter (Signed)
Attempted to reach patient on home #, message stated it is invalid #. I called cell and reached her husband; he stated it was his cell #,  and he gave her cell # [phone].  I called patient on her cell, informed her that her recent cholesterol panel showed an increase of her LDL or bad cholesterol from 119 to 126. As the LDL goal is less than 70, it would be recommended to increase Lipitor dose from 40 mg to 80 mg. I advised her the NP stated if she is in agreement, an order will be placed. She stated she agreed to increase, and she will take two of 40 mg tabs until those are gone. She requested new Rx be sent to [facility] [place], and verbalized understanding, appreciation.

## 2018-09-21 ENCOUNTER — Other Ambulatory Visit: Payer: Self-pay

## 2018-09-23 ENCOUNTER — Ambulatory Visit: Payer: Medicare HMO | Admitting: Internal Medicine

## 2018-10-04 ENCOUNTER — Ambulatory Visit: Payer: Medicare HMO | Admitting: Internal Medicine

## 2018-10-04 ENCOUNTER — Encounter: Payer: Self-pay | Admitting: Internal Medicine

## 2018-10-04 ENCOUNTER — Other Ambulatory Visit: Payer: Self-pay

## 2018-10-04 VITALS — BP 132/82 | HR 67 | Temp 98.9°F | Ht 65.0 in | Wt 206.2 lb

## 2018-10-04 DIAGNOSIS — E1159 Type 2 diabetes mellitus with other circulatory complications: Secondary | ICD-10-CM | POA: Diagnosis not present

## 2018-10-04 LAB — GLUCOSE, POCT (MANUAL RESULT ENTRY): POC Glucose: 186 mg/dl — AB (ref 70–99)

## 2018-10-04 MED ORDER — ACCU-CHEK GUIDE VI STRP: ORAL_STRIP | 12 refills | Status: DC

## 2018-10-04 MED ORDER — ACCU-CHEK MULTICLIX LANCETS MISC: 12 refills | Status: AC

## 2018-10-04 MED ORDER — FARXIGA 5 MG PO TABS: 5.0000 mg | ORAL_TABLET | Freq: Every day | ORAL | 3 refills | Status: DC

## 2018-10-04 MED ORDER — ACCU-CHEK GUIDE ME W/DEVICE KIT: 1.0000 | PACK | Freq: Every day | 0 refills | Status: DC

## 2018-10-04 NOTE — Progress Notes (Signed)
Name: Jacqueline [name]  MRN/ DOB: [id number], 17-Jul-1950   Age/ Sex: 68 y.o., female    PCP: Nicola Girt, DO   Reason for Endocrinology Evaluation: Type 2 Diabetes Mellitus     Date of Initial Endocrinology Visit: 10/04/2018     PATIENT IDENTIFIER: Jacqueline [name] is a 68 y.o. female with a past medical history of HTN, TIA, T2DM . The patient presented for initial endocrinology clinic visit on 10/04/2018 for consultative assistance with her diabetes management.    HPI: Jacqueline [name] was    Diagnosed with DM for  Years Prior Medications tried/Intolerance: insulin started 07/2018, previous PCP attempted Victoza but the pt didn't;t start as her sister had a bad experience with it.  Currently checking blood sugars 1 x / day,  before breakfast Hypoglycemia episodes : no             Hemoglobin A1c has ranged from 7.0's % , peaking at 11.2 %in 2020. Patient required assistance for hypoglycemia: no Patient has required hospitalization within the last 1 year from hyper or hypoglycemia: no  In terms of diet, the patient eats 3 meals a days, snacks twice a day, drinks diet drinks.   HOME DIABETES REGIMEN: Metformin 1000 mg BID Glipizide 10 mg BID   Levemir 10 units daily    Statin: yes ACE-I/ARB: no  Prior Diabetic Education: Yes   METER DOWNLOAD SUMMARY: Did not bring    DIABETIC COMPLICATIONS: Microvascular complications:   Denies: retinopathy, neuropathy, CKD  Last eye exam: Completed 2019  Macrovascular complications:   TIA ( [id number])  Denies: CAD, PVD   PAST HISTORY: Past Medical History:  Past Medical History:  Diagnosis Date  . Diabetes mellitus   . HLD (hyperlipidemia)   . Hypertension   . Nephrolithiasis   . Obese    Past Surgical History:  Past Surgical History:  Procedure Laterality Date  . ROTATOR CUFF REPAIR  2009  . TUBAL LIGATION  1979      Social History:  reports that she has never smoked. She has never used smokeless tobacco. She  reports that she does not drink alcohol or use drugs. Family History:  Family History  Problem Relation Age of Onset  . Diabetes Father   . Heart attack Father 90  . Heart disease Mother        hx cabg at 28  . Heart attack Brother      HOME MEDICATIONS: Allergies as of 10/04/2018      Reactions   Augmentin [amoxicillin-pot Clavulanate] Other (See Comments)   Altered mental status Did it involve swelling of the face/tongue/throat, SOB, or low BP? Unknown Did it involve sudden or severe rash/hives, skin peeling, or any reaction on the inside of your mouth or nose? Unknown Did you need to seek medical attention at a hospital or doctor's office? Unknown When did it last happen? unk If all above answers are "NO", may proceed with cephalosporin use.   Definity [perflutren Lipid Microsphere] Other (See Comments)   Headache and facial flushing after Definity IVP given. No SOB or Chest pain,and Vital signs stable. Irven Baltimore, RN.   Novocain [procaine] Other (See Comments)   Altered mental status   Promethazine Swelling   Tongue swelling      Medication List       Accurate as of October 04, 2018  1:52 PM. If you have any questions, ask your nurse or doctor.        STOP taking these  medications   glipiZIDE 10 MG tablet Commonly known as: GLUCOTROL Stopped by: Dorita Sciara, MD     TAKE these medications   aspirin 81 MG EC tablet Take 1 tablet (81 mg total) by mouth daily.   atorvastatin 80 MG tablet Commonly known as: LIPITOR Take 0.5 tablets (40 mg total) by mouth daily at 6 PM.   Farxiga 5 MG Tabs tablet Generic drug: dapagliflozin propanediol Take 5 mg by mouth daily before breakfast. Started by: Dorita Sciara, MD   Insulin Detemir 100 UNIT/ML Pen Commonly known as: LEVEMIR Inject 10 Units into the skin daily.   Insulin Pen Needle 32G X 4 MM Misc Use daily   metFORMIN 1000 MG tablet Commonly known as: GLUCOPHAGE Take 1,000 mg by mouth 2  (two) times daily with a meal.   topiramate 50 MG tablet Commonly known as: Topamax Take 1 tablet (50 mg total) by mouth 2 (two) times daily.        ALLERGIES: Allergies  Allergen Reactions  . Augmentin [Amoxicillin-Pot Clavulanate] Other (See Comments)    Altered mental status Did it involve swelling of the face/tongue/throat, SOB, or low BP? Unknown Did it involve sudden or severe rash/hives, skin peeling, or any reaction on the inside of your mouth or nose? Unknown Did you need to seek medical attention at a hospital or doctor's office? Unknown When did it last happen? unk If all above answers are "NO", may proceed with cephalosporin use.   Marland Kitchen Definity [Perflutren Lipid Microsphere] Other (See Comments)    Headache and facial flushing after Definity IVP given. No SOB or Chest pain,and Vital signs stable. Irven Baltimore, RN.  Marland Kitchen Novocain [Procaine] Other (See Comments)    Altered mental status  . Promethazine Swelling    Tongue swelling     REVIEW OF SYSTEMS: A comprehensive ROS was conducted with the patient and is negative except as per HPI and below:  Review of Systems  Constitutional: Negative for chills and fever.  HENT: Negative for congestion and sore throat.   Eyes: Negative for blurred vision and pain.  Respiratory: Negative for cough and shortness of breath.   Cardiovascular: Negative for chest pain and palpitations.  Gastrointestinal: Negative for nausea.       Abdominal bloating   Genitourinary: Negative for frequency.  Neurological: Negative for tremors and sensory change.  Endo/Heme/Allergies: Negative for polydipsia.  Psychiatric/Behavioral: Negative for depression. The patient is not nervous/anxious.       OBJECTIVE:   VITAL SIGNS: BP 132/82 (BP Location: Left Arm, Patient Position: Sitting, Cuff Size: Large)   Pulse 67   Temp 98.9 F (37.2 C)   Ht 5\' 5"  (1.651 m)   Wt 206 lb 3.2 oz (93.5 kg)   SpO2 96%   BMI 34.31 kg/m    PHYSICAL EXAM:   General: Pt appears well and is in NAD  Hydration: Well-hydrated with moist mucous membranes and good skin turgor  HEENT: Head: Unremarkable with good dentition. Oropharynx clear without exudate.  Eyes: External eye exam normal without stare, lid lag or exophthalmos.  EOM intact.  PERRL.  Neck: General: Supple without adenopathy or carotid bruits. Thyroid: Thyroid size normal.  No goiter or nodules appreciated. No thyroid bruit.  Lungs: Clear with good BS bilat with no rales, rhonchi, or wheezes  Heart: RRR with normal S1 and S2 and no gallops; no murmurs; no rub  Abdomen: Normoactive bowel sounds, soft, nontender, without masses or organomegaly palpable  Extremities:  Lower extremities - No  pretibial edema. No lesions.  Skin: Normal texture and temperature to palpation. No rash noted. No Acanthosis nigricans/skin tags. No lipohypertrophy.  Neuro: MS is good with appropriate affect, pt is alert and Ox3    DM foot exam: 10/04/18  The skin of the feet is intact without sores or ulcerations. The pedal pulses are 2+ on right and 2+ on left. The sensation is intact to a screening 5.07, 10 gram monofilament bilaterally   DATA REVIEWED:  Lab Results  Component Value Date   HGBA1C 11.2 (H) 07/13/2018   Lab Results  Component Value Date   LDLCALC 126 (H) 09/01/2018   CREATININE 0.90 07/14/2018   No results found for: [facility]  Lab Results  Component Value Date   CHOL 203 (H) 09/01/2018   HDL 45 09/01/2018   LDLCALC 126 (H) 09/01/2018   TRIG 161 (H) 09/01/2018   CHOLHDL 4.5 (H) 09/01/2018       In-Office BG 186 mg/dL  ASSESSMENT / PLAN / RECOMMENDATIONS:   1) Type 2 Diabetes Mellitus, Poorly controlled, With Macrovascular  complications - Most recent A1c of 11.2 %. Goal A1c < 7.0 %.    - Poorly controlled diabetes due to medication non-adherence.  - I have discussed with the patient the pathophysiology of diabetes. We went over the natural progression of the disease. We  talked about both insulin resistance and insulin deficiency. We stressed the importance of lifestyle changes including diet and exercise. I explained the complications associated with diabetes including retinopathy, nephropathy, neuropathy as well as increased risk of cardiovascular disease. We went over the benefit seen with glycemic control.  I explained to the patient that diabetic patients are at higher than normal risk for amputations.   - We discussed the risk of hypoglycemia with insulin and SU combinations, will stop Glipizide.  - We discussed add on therapy with SGLT -2 , we discussed the risk and the benefit, we discussed risk of genital infections.    MEDICATIONS: -  Stop Glipizide - Start Farxiga  5 mg daily with Breakfast  - Increase Levemir to 20 units daily  - Continue Metformin Twice a day    EDUCATION / INSTRUCTIONS:  BG monitoring instructions: Patient is instructed to check her blood sugars 2 times a day, fasting and bedtime.  Call [facility] if: BG persistently < 70 or > 300. . I reviewed the Rule of 15 for the treatment of hypoglycemia in detail with the patient. Literature supplied.   2) Diabetic complications:   Eye: Does not have known diabetic retinopathy.   Neuro/ Feet: Does not have known diabetic peripheral neuropathy.  Renal: Patient does not have known baseline CKD. She is not on an ACEI/ARB at present.Check urine albumin/creatinine ratio yearly. If albuminuria is positive, treatment is geared toward better glucose, blood pressure control and use of ACE inhibitors or ARBs. Monitor electrolytes and creatinine once to twice yearly.   3) Lipids: Patient is on a statin.    F/u in 3 months       Signed electronically by: Mack Guise, MD  [facility]  [facility] [address] [place], [place] [address] Phone: [phone] FAX: [phone]   CC: Lewie Loron [id number] [place] [address] Phone: [phone]  Fax: [phone]    Return to Endocrinology clinic as below: Future Appointments  Date Time Provider [place]  11/16/2018 10:45 AM Claris Gower, NP [facility] None  01/03/2019  9:30  AM , Melanie Crazier, MD [facility] None

## 2018-10-04 NOTE — Patient Instructions (Signed)
-   Stop Glipizide - Start Farxiga  5 mg daily with Breakfast  - Increase Levemir to 20 units daily  - Continue Metformin Twice a day    - Check sugar before breakfast and Bedtime   - Choose healthy, lower carb lower calorie snacks: toss salad, vegetables, cottage cheese, peanut butter, low fat cheese / string cheese, lower sodium deli meat, tuna salad or chicken salad     HOW TO TREAT LOW BLOOD SUGARS (Blood sugar LESS THAN 70 MG/DL)  Please follow the RULE OF 15 for the treatment of hypoglycemia treatment (when your (blood sugars are less than 70 mg/dL)    STEP 1: Take 15 grams of carbohydrates when your blood sugar is low, which includes:   3-4 GLUCOSE TABS  OR  3-4 OZ OF JUICE OR REGULAR SODA OR  ONE TUBE OF GLUCOSE GEL     STEP 2: RECHECK blood sugar in 15 MINUTES STEP 3: If your blood sugar is still low at the 15 minute recheck --> then, go back to STEP 1 and treat AGAIN with another 15 grams of carbohydrates.

## 2018-10-05 ENCOUNTER — Telehealth: Payer: Self-pay | Admitting: Internal Medicine

## 2018-10-05 NOTE — Telephone Encounter (Signed)
Pt called about the medication that she was supposed to start on she cannot afford it and she was to know if there is a generic brand she can afford. Please advise?   Call pt @ [phone] or [phone]. Thank you!

## 2018-10-06 MED ORDER — JARDIANCE 10 MG PO TABS: 10.0000 mg | ORAL_TABLET | Freq: Every day | ORAL | 3 refills | Status: DC

## 2018-10-06 NOTE — Telephone Encounter (Signed)
Medication was farxiga, please advise

## 2018-10-06 NOTE — Telephone Encounter (Signed)
Pt informed

## 2018-10-14 ENCOUNTER — Telehealth: Payer: Self-pay | Admitting: Internal Medicine

## 2018-10-14 NOTE — Telephone Encounter (Signed)
Pt has lost her metformin medication. She cannot find it anywhere and is asking if we can send in a rx for some to her pharmacy [facility] in [place]  Did let the pt know this might not be paid for depending on how early she is on refilling

## 2018-11-03 ENCOUNTER — Telehealth: Payer: Self-pay | Admitting: Adult Health

## 2018-11-03 NOTE — Telephone Encounter (Signed)
Phone rep called pt in response to voicemail.  In voicemail pt stated she feels she may have a blood clot or could be a circulation problem in right leg due to medications she is on.  Please call

## 2018-11-04 ENCOUNTER — Ambulatory Visit: Payer: Self-pay | Admitting: Adult Health

## 2018-11-04 ENCOUNTER — Ambulatory Visit: Payer: Medicare HMO | Admitting: Adult Health

## 2018-11-04 NOTE — Telephone Encounter (Signed)
I called pt and she relayed that she hs funny feeling of sensation traveling up from bottom of leg, to knee then hip.  Tightness feeling.  ? Spasm, does feel like cramp.  No pain, swelling, noted.  She has appt 11-16-18 but she was concerned due to her having hx tia, that may be clot.  I relayed that urgent care wed pm or pcp could evaluate sooner.  She was heading out of town Thursday, so was conflicted about what to do.  I told her JM/NP had opening at [place], could come in for her appt then.  She wanted to make this appt for 11-04-18 at [place].  If not able to make it she will call.  I will keep 11/16/18 appt until hear from her.  A no show fee will not be applied.  She verbalized understanding.

## 2018-11-04 NOTE — Progress Notes (Deleted)
[facility] [address] [place]. [place] [address] [phone]       HOSPITAL FOLLOW UP NOTE  [name] Date of Birth:  12/13/1950 Medical Record Number:  [id number]   Reason for visit: f/u TIA versus complicated migraine 2-02 20    CHIEF COMPLAINT:  No chief complaint on file.   HPI: Hospital admission 07/13/2018: Jacqueline [name] is an 68 y.o. Caucasian female with PMH of hypertension, hyperlipidemia, obesity and diabetes who presented to [place] on 07/14/2018 with 3 episode of left arm and face numbness tingling for the last 3 days.  Initial episode lasting only 3 minutes and then resolved but followed by headache with bifrontal throbbing was lasted approximately 3 minutes.  She attributed this to empty stomach.  Second episode occurred the next day lasting approximately 3 minutes and then accompanied by headache lasting approximately 3 minutes which occurred prior to lunch.  Third episode occurred while eating lunch with similar symptoms plus feeling heaviness of left arm which lasted about 5 minutes followed by mild brief headache.  Denies any prior history of migraine or headaches.  She does endorse having visual auras with flashing lights in the past.  Stroke work-up unremarkable and likely right brain TIA given risk factors versus complicated migraine due to headache following each episode.  Stroke work-up completed once transferred to [facility] ED.  MRI no acute stroke but did show small deep white matter infarcts.  Vessel imaging and 2D echo unremarkable.  LDL 119 and A1c 11.2.  Recommended DAPT for 3 weeks and aspirin alone.  HTN stable.  Initiated atorvastatin 40 mg daily for HLD management.  Recommended close PCP follow-up for uncontrolled DM.  Other stroke risk factors include advanced age, obesity and one episode of prior vision loss lasting 1 to 2 seconds approximately 3 to 4 years ago.  As all symptoms resolved without residual deficit, discharged  home in stable condition without therapy needs. She was discharged in the afternoon on 07/14/2018 but returned to [facility] ED that night due to recurrence of numbness in left shoulder and headache.  CT head unremarkable.  Was consulted by neurology and felt her symptoms were likely related to complex migraine as extensive stroke work-up unremarkable.  Discharged on Topamax and recommend follow-up with neurology outpatient.  Initial visit 09/01/2018: Since discharge, she has experienced 2 additional headaches which were preceded by left arm numbness/tingling.  Symptoms similar to prior episodes.  She has continued on Topamax 50 mg nightly with only minimal daytime fatigue but did not limit her overall functioning.   She does endorse approximately 2 weeks prior, she woke up in the morning with tingling on her nose and swelling of top lip.  She took Benadryl throughout the day and resolved within 24 hours.  Symptoms consistent with allergic reaction to unknown modality as she did not change medications at that time, denies eating a different type of food or use of lip product.  Completed 3 weeks DAPT continues on aspirin alone without bleeding or bruising Previously taking atorvastatin 40 mg daily without side effects myalgias but has since run out of prescription Blood pressure 147/77 -does not routinely monitor at home Glucose levels uncontrolled and is in the process of finding new PCP versus establishing care with endocrinology due to continued difficulty with diabetic management No further concerns at this time  Update 11/04/2018: Jacqueline [name] is being seen today for follow-up.  Continues on aspirin without bleeding or bruising.  At prior  visit, lipid panel obtained with LDL 126 (prior 119).  Recommended to increase atorvastatin dose from 40 mg to 80 mg daily.  She continues on atorvastatin 80 mg daily without myalgias.  Blood pressure today ***.     ROS:   14 system review of systems performed and negative  with exception of headache  PMH:  Past Medical History:  Diagnosis Date   Diabetes mellitus    HLD (hyperlipidemia)    Hypertension    Nephrolithiasis    Obese     PSH:  Past Surgical History:  Procedure Laterality Date   ROTATOR CUFF REPAIR  2009   TUBAL LIGATION  1979    Social History:  Social History   Socioeconomic History   Marital status: Married    Spouse name: Not on file   Number of children: 4   Years of education: Not on file   Highest education level: Not on file  Occupational History   Occupation: Retired-Missionary  Scientist, product/process development strain: Not on file   Food insecurity    Worry: Not on file    Inability: Not on file   Transportation needs    Medical: Not on file    Non-medical: Not on file  Tobacco Use   Smoking status: Never Smoker   Smokeless tobacco: Never Used  Substance and Sexual Activity   Alcohol use: No   Drug use: No   Sexual activity: Not on file  Lifestyle   Physical activity    Days per week: Not on file    Minutes per session: Not on file   Stress: Not on file  Relationships   Social connections    Talks on phone: Not on file    Gets together: Not on file    Attends religious service: Not on file    Active member of club or organization: Not on file    Attends meetings of clubs or organizations: Not on file    Relationship status: Not on file   Intimate partner violence    Fear of current or ex partner: Not on file    Emotionally abused: Not on file    Physically abused: Not on file    Forced sexual activity: Not on file  Other Topics Concern   Not on file  Social History Narrative   Not on file    Family History:  Family History  Problem Relation Age of Onset   Diabetes Father    Heart attack Father 44   Heart disease Mother        hx cabg at 52   Heart attack Brother     Medications:   Current Outpatient Medications on File Prior to Visit  Medication Sig  Dispense Refill   aspirin EC 81 MG EC tablet Take 1 tablet (81 mg total) by mouth daily. 21 tablet 0   atorvastatin (LIPITOR) 80 MG tablet Take 0.5 tablets (40 mg total) by mouth daily at 6 PM. 30 tablet 3   Blood Glucose Monitoring Suppl (ACCU-CHEK GUIDE ME) w/Device KIT 1 kit by Does not apply route daily. 1 kit 0   empagliflozin (JARDIANCE) 10 MG TABS tablet Take 10 mg by mouth daily before breakfast. 30 tablet 3   glucose blood (ACCU-CHEK GUIDE) test strip Use as instructed to test blood sugars 3 times daily E11.65 100 each 12   Insulin Detemir (LEVEMIR) 100 UNIT/ML Pen Inject 10 Units into the skin daily. 10 mL 0   Insulin Pen Needle  32G X 4 MM MISC Use daily 30 each 0   Lancets (ACCU-CHEK MULTICLIX) lancets Use as instructed to test blood sugars 3 times daily E11.65 100 each 12   metFORMIN (GLUCOPHAGE) 1000 MG tablet Take 1,000 mg by mouth 2 (two) times daily with a meal.      topiramate (TOPAMAX) 50 MG tablet Take 1 tablet (50 mg total) by mouth 2 (two) times daily. 60 tablet 2   No current facility-administered medications on file prior to visit.     Allergies:   Allergies  Allergen Reactions   Augmentin [Amoxicillin-Pot Clavulanate] Other (See Comments)    Altered mental status Did it involve swelling of the face/tongue/throat, SOB, or low BP? Unknown Did it involve sudden or severe rash/hives, skin peeling, or any reaction on the inside of your mouth or nose? Unknown Did you need to seek medical attention at a hospital or doctor's office? Unknown When did it last happen? unk If all above answers are NO, may proceed with cephalosporin use.    Definity [Perflutren Lipid Microsphere] Other (See Comments)    Headache and facial flushing after Definity IVP given. No SOB or Chest pain,and Vital signs stable. Irven Baltimore, RN.   Novocain [Procaine] Other (See Comments)    Altered mental status   Promethazine Swelling    Tongue swelling     Physical  Exam  There were no vitals filed for this visit. There is no height or weight on file to calculate BMI. No exam data present  No flowsheet data found.   General: well developed, well nourished,  pleasant middle-age Caucasian female, seated, in no evident distress Head: head normocephalic and atraumatic.   Neck: supple with no carotid or supraclavicular bruits Cardiovascular: regular rate and rhythm, no murmurs Musculoskeletal: no deformity Skin:  no rash/petichiae Vascular:  Normal pulses all extremities   Neurologic Exam Mental Status: Awake and fully alert. Oriented to place and time. Recent and remote memory intact. Attention span, concentration and fund of knowledge appropriate. Mood and affect appropriate.  Cranial Nerves: Fundoscopic exam reveals sharp disc margins. Pupils equal, briskly reactive to light. Extraocular movements full without nystagmus. Visual fields full to confrontation. Hearing intact. Facial sensation intact. Face, tongue, palate moves normally and symmetrically.  Motor: Normal bulk and tone. Normal strength in all tested extremity muscles. Sensory.: intact to touch , pinprick , position and vibratory sensation.  Coordination: Rapid alternating movements normal in all extremities. Finger-to-nose and heel-to-shin performed accurately bilaterally. Gait and Station: Arises from chair without difficulty. Stance is normal. Gait demonstrates normal stride length and balance Reflexes: 1+ and symmetric. Toes downgoing.     NIHSS  0 Modified Rankin  0    Diagnostic Data (Labs, Imaging, Testing)  CT HEAD WO CONTRAST 07/13/2018 IMPRESSION: 1. Age indeterminate white matter small vessel disease type changes in the right centrum semiovale. 2. No superimposed acute cortically based infarct or acute intracranial hemorrhage identified. 3. ASPECTS is 10.  CT ANGIO HEAD W OR WO CONTRAST CT ANGIO NECK W OR WO CONTRAST 07/14/2018 IMPRESSION: 1. No intracranial  hemorrhage or emergent large vessel occlusion. 2. Unchanged appearance of right frontal white matter lesion, perpendicular to the long axis of the lateral ventricles. While small vessel infarcts could have this appearance, the orientation of this lesion is concerning for demyelination. MRI of the brain with and without contrast is recommended to assess for multiple sclerosis. 3. Bilateral carotid bifurcation atherosclerosis with less than 50% stenosis.  MR BRAIN WO CONTRAST 07/14/2018 IMPRESSION: 1.  No emergent finding. 2. Small remote lacunar infarcts in the deep cerebral white matter.  ECHOCARDIOGRAM 07/14/2018 IMPRESSIONS  1. The left ventricle has normal systolic function with an ejection fraction of 60-65%. The cavity size was normal. Left ventricular diastolic Doppler parameters are consistent with impaired relaxation. No evidence of left ventricular regional wall  motion abnormalities.  2. The right ventricle has normal systolic function. The cavity was normal. There is no increase in right ventricular wall thickness. Right ventricular systolic pressure could not be assessed.     ASSESSMENT: LONDAN COPLEN is a 68 y.o. year old female here with right brain TIA given risk factors versus complicated migraine on 7/0/3500 after presenting with 3 episodes of intermittent numbness/tingling in left arm and face x3 days followed by headache.  Return to ED the evening after discharge due to return of symptoms with diagnosis of likely complicated migraine with Topamax initiated.  Vascular risk factors include HTN, HLD, uncontrolled DM, obesity and prior stroke on imaging.  Since recent discharge, she has experienced 2 additional episodes consisting of same symptoms.    PLAN:  1. ?  Right brain TIA: Continue aspirin 81 mg daily  and restart atorvastatin for secondary stroke prevention. Maintain strict control of hypertension with blood pressure goal below 130/90, diabetes with hemoglobin A1c  goal below 6.5% and cholesterol with LDL cholesterol (bad cholesterol) goal below 70 mg/dL.  I also advised the patient to eat a healthy diet with plenty of whole grains, cereals, fruits and vegetables, exercise regularly with at least 30 minutes of continuous activity daily and maintain ideal body weight. 2. ?  Complicated migraine: Continue Topamax 50 mg twice daily.  Discussion regarding complicated migraines and ongoing attempts at preventing 3. HTN: Advised to continue current treatment regimen.  Today's BP 147/77.  Advised to monitor at home along with continued follow-up with PCP for management 4. HLD: Advised to continue current treatment regimen along with continued follow-up with PCP for future prescribing and monitoring of lipid panel.  Lipid panel will be obtained to ensure improvement of LDL with initiation of atorvastatin.  Refill for atorvastatin placed but request ongoing prescribed by PCP 5. DMII: Referral to endocrinology due to continued uncontrolled glucose levels.  Discussion regarding importance of glucose management in regards to secondary stroke prevention    Follow up in 2 months or call earlier if needed   Greater than 50% of time during this 45 minute visit was spent on counseling, explanation of diagnosis of TIA versus complicated migraine, reviewing risk factor management of HTN, HLD and DM, planning of further management along with potential future management, discussion regarding likelihood of symptoms being related to complicated migraine but is also important to ensure ongoing stroke prevention and discussion with patient and family answering all questions.    Venancio Poisson, AGNP-BC  [facility] [address] [place], [place] [address]  Phone [phone] Fax [phone] Note: This document was prepared with digital dictation and possible smart phrase technology. Any transcriptional errors that result from this process are  unintentional.

## 2018-11-16 ENCOUNTER — Encounter: Payer: Self-pay | Admitting: Adult Health

## 2018-11-16 ENCOUNTER — Ambulatory Visit: Payer: Medicare HMO | Admitting: Adult Health

## 2018-11-16 ENCOUNTER — Other Ambulatory Visit: Payer: Self-pay

## 2018-11-16 VITALS — BP 130/74 | HR 64 | Temp 97.8°F | Ht 65.5 in | Wt 193.6 lb

## 2018-11-16 DIAGNOSIS — G43109 Migraine with aura, not intractable, without status migrainosus: Secondary | ICD-10-CM | POA: Diagnosis not present

## 2018-11-16 DIAGNOSIS — E1159 Type 2 diabetes mellitus with other circulatory complications: Secondary | ICD-10-CM | POA: Diagnosis not present

## 2018-11-16 DIAGNOSIS — I10 Essential (primary) hypertension: Secondary | ICD-10-CM

## 2018-11-16 DIAGNOSIS — E785 Hyperlipidemia, unspecified: Secondary | ICD-10-CM

## 2018-11-16 MED ORDER — ATORVASTATIN CALCIUM 80 MG PO TABS: 80.0000 mg | ORAL_TABLET | Freq: Every day | ORAL | 3 refills | Status: DC

## 2018-11-16 MED ORDER — TOPIRAMATE 50 MG PO TABS: 50.0000 mg | ORAL_TABLET | Freq: Two times a day (BID) | ORAL | 5 refills | Status: DC

## 2018-11-16 NOTE — Progress Notes (Signed)
[facility] [address] [place]. [place] [address] [phone]       HOSPITAL FOLLOW UP NOTE  Ms. [name] Date of Birth:  1950-12-15 Medical Record Number:  [id number]   Reason for visit: f/u TIA versus complicated migraine 9-32 20    CHIEF COMPLAINT:  Chief Complaint  Patient presents with   Follow-up    2 mon f/u. Alone. Rm 9. Patient mentioned that when she still has headaches on the right side of her head that comes and goes    HPI: Hospital admission 07/13/2018: Jacqueline [name] is an 68 y.o. Caucasian female with PMH of hypertension, hyperlipidemia, obesity and diabetes who presented to [facility] on 07/14/2018 with 3 episode of left arm and face numbness tingling for the last 3 days.  Initial episode lasting only 3 minutes and then resolved but followed by headache with bifrontal throbbing was lasted approximately 3 minutes.  She attributed this to empty stomach.  Second episode occurred the next day lasting approximately 3 minutes and then accompanied by headache lasting approximately 3 minutes which occurred prior to lunch.  Third episode occurred while eating lunch with similar symptoms plus feeling heaviness of left arm which lasted about 5 minutes followed by mild brief headache.  Denies any prior history of migraine or headaches.  She does endorse having visual auras with flashing lights in the past.  Stroke work-up unremarkable and likely right brain TIA given risk factors versus complicated migraine due to headache following each episode.  Stroke work-up completed once transferred to [facility] ED.  MRI no acute stroke but did show small deep white matter infarcts.  Vessel imaging and 2D echo unremarkable.  LDL 119 and A1c 11.2.  Recommended DAPT for 3 weeks and aspirin alone.  HTN stable.  Initiated atorvastatin 40 mg daily for HLD management.  Recommended close PCP follow-up for uncontrolled DM.  Other stroke risk factors include advanced age,  obesity and one episode of prior vision loss lasting 1 to 2 seconds approximately 3 to 4 years ago.  As all symptoms resolved without residual deficit, discharged home in stable condition without therapy needs. She was discharged in the afternoon on 07/14/2018 but returned to [facility] ED that night due to recurrence of numbness in left shoulder and headache.  CT head unremarkable.  Was consulted by neurology and felt her symptoms were likely related to complex migraine as extensive stroke work-up unremarkable.  Discharged on Topamax and recommend follow-up with neurology outpatient.  Initial visit 09/01/2018: Since discharge, she has experienced 2 additional headaches which were preceded by left arm numbness/tingling.  Symptoms similar to prior episodes.  She has continued on Topamax 50 mg nightly with only minimal daytime fatigue but did not limit her overall functioning.   She does endorse approximately 2 weeks prior, she woke up in the morning with tingling on her nose and swelling of top lip.  She took Benadryl throughout the day and resolved within 24 hours.  Symptoms consistent with allergic reaction to unknown modality as she did not change medications at that time, denies eating a different type of food or use of lip product.  Completed 3 weeks DAPT continues on aspirin alone without bleeding or bruising Previously taking atorvastatin 40 mg daily without side effects myalgias but has since run out of prescription Blood pressure 147/77 -does not routinely monitor at home Glucose levels uncontrolled and is in the process of finding new PCP versus establishing care with endocrinology due to  continued difficulty with diabetic management No further concerns at this time  Update 11/16/2018: [name] is being seen today for follow-up.  Continues on aspirin without bleeding or bruising.  At prior visit, lipid panel obtained with LDL 126 (prior 119).  Recommended to increase atorvastatin dose from 40 mg to 80 mg  daily.  She continues on atorvastatin 80 mg daily without myalgias.  Lipid panels have not been repeated since that time.  Blood pressure today 130/74.  Established care with endocrinology with adjustments made to medication regimen. Improvement of glucose levels per patient report.  She is also working on weight loss with prior office visit weight 208lbs and today's weight 193lb.  She has been working hard on improving diet and exercise.  Continues on Topamax 50 mg twice daily. Has not had any additional migraine with left arm numbness.  Will have occasional headaches which she believes is related to tension/stress but resolves without intervention.  No further concerns at this time.    ROS:   14 system review of systems performed and negative with exception of occasional headache  PMH:  Past Medical History:  Diagnosis Date   Diabetes mellitus    HLD (hyperlipidemia)    Hypertension    Nephrolithiasis    Obese     PSH:  Past Surgical History:  Procedure Laterality Date   ROTATOR CUFF REPAIR  2009   TUBAL LIGATION  1979    Social History:  Social History   Socioeconomic History   Marital status: Married    Spouse name: Not on file   Number of children: 4   Years of education: Not on file   Highest education level: Not on file  Occupational History   Occupation: Retired-Missionary  Scientist, product/process development strain: Not on file   Food insecurity    Worry: Not on file    Inability: Not on file   Transportation needs    Medical: Not on file    Non-medical: Not on file  Tobacco Use   Smoking status: Never Smoker   Smokeless tobacco: Never Used  Substance and Sexual Activity   Alcohol use: No   Drug use: No   Sexual activity: Not on file  Lifestyle   Physical activity    Days per week: Not on file    Minutes per session: Not on file   Stress: Not on file  Relationships   Social connections    Talks on phone: Not on file    Gets  together: Not on file    Attends religious service: Not on file    Active member of club or organization: Not on file    Attends meetings of clubs or organizations: Not on file    Relationship status: Not on file   Intimate partner violence    Fear of current or ex partner: Not on file    Emotionally abused: Not on file    Physically abused: Not on file    Forced sexual activity: Not on file  Other Topics Concern   Not on file  Social History Narrative   Not on file    Family History:  Family History  Problem Relation Age of Onset   Diabetes Father    Heart attack Father 11   Heart disease Mother        hx cabg at 33   Heart attack Brother     Medications:   Current Outpatient Medications on File Prior to Visit  Medication  Sig Dispense Refill   aspirin EC 81 MG EC tablet Take 1 tablet (81 mg total) by mouth daily. 21 tablet 0   Blood Glucose Monitoring Suppl (ACCU-CHEK GUIDE ME) w/Device KIT 1 kit by Does not apply route daily. 1 kit 0   empagliflozin (JARDIANCE) 10 MG TABS tablet Take 10 mg by mouth daily before breakfast. 30 tablet 3   glucose blood (ACCU-CHEK GUIDE) test strip Use as instructed to test blood sugars 3 times daily E11.65 100 each 12   Insulin Detemir (LEVEMIR) 100 UNIT/ML Pen Inject 10 Units into the skin daily. (Patient taking differently: Inject 20 Units into the skin daily. ) 10 mL 0   Insulin Pen Needle 32G X 4 MM MISC Use daily 30 each 0   Lancets (ACCU-CHEK MULTICLIX) lancets Use as instructed to test blood sugars 3 times daily E11.65 100 each 12   metFORMIN (GLUCOPHAGE) 1000 MG tablet Take 1,000 mg by mouth 2 (two) times daily with a meal.      No current facility-administered medications on file prior to visit.     Allergies:   Allergies  Allergen Reactions   Augmentin [Amoxicillin-Pot Clavulanate] Other (See Comments)    Altered mental status Did it involve swelling of the face/tongue/throat, SOB, or low BP? Unknown Did it  involve sudden or severe rash/hives, skin peeling, or any reaction on the inside of your mouth or nose? Unknown Did you need to seek medical attention at a hospital or doctor's office? Unknown When did it last happen? unk If all above answers are NO, may proceed with cephalosporin use.    Definity [Perflutren Lipid Microsphere] Other (See Comments)    Headache and facial flushing after Definity IVP given. No SOB or Chest pain,and Vital signs stable. Irven Baltimore, RN.   Novocain [Procaine] Other (See Comments)    Altered mental status   Promethazine Swelling    Tongue swelling     Physical Exam  Vitals:   11/16/18 1025  BP: 130/74  Pulse: 64  Temp: 97.8 F (36.6 C)  TempSrc: Oral  Weight: 193 lb 9.6 oz (87.8 kg)  Height: 5' 5.5" (1.664 m)   Body mass index is 31.73 kg/m. No exam data present  No flowsheet data found.   General: well developed, well nourished,  pleasant middle-age Caucasian female, seated, in no evident distress Head: head normocephalic and atraumatic.   Neck: supple with no carotid or supraclavicular bruits Cardiovascular: regular rate and rhythm, no murmurs Musculoskeletal: no deformity Skin:  no rash/petichiae Vascular:  Normal pulses all extremities   Neurologic Exam Mental Status: Awake and fully alert. Oriented to place and time. Recent and remote memory intact. Attention span, concentration and fund of knowledge appropriate. Mood and affect appropriate.  Cranial Nerves: Pupils equal, briskly reactive to light. Extraocular movements full without nystagmus. Visual fields full to confrontation. Hearing intact. Facial sensation intact. Face, tongue, palate moves normally and symmetrically.  Motor: Normal bulk and tone. Normal strength in all tested extremity muscles. Sensory.: intact to touch , pinprick , position and vibratory sensation.  Coordination: Rapid alternating movements normal in all extremities. Finger-to-nose and heel-to-shin  performed accurately bilaterally. Gait and Station: Arises from chair without difficulty. Stance is normal. Gait demonstrates normal stride length and balance Reflexes: 1+ and symmetric. Toes downgoing.        Diagnostic Data (Labs, Imaging, Testing)  CT HEAD WO CONTRAST 07/13/2018 IMPRESSION: 1. Age indeterminate white matter small vessel disease type changes in the right centrum semiovale. 2. No  superimposed acute cortically based infarct or acute intracranial hemorrhage identified. 3. ASPECTS is 10.  CT ANGIO HEAD W OR WO CONTRAST CT ANGIO NECK W OR WO CONTRAST 07/14/2018 IMPRESSION: 1. No intracranial hemorrhage or emergent large vessel occlusion. 2. Unchanged appearance of right frontal white matter lesion, perpendicular to the long axis of the lateral ventricles. While small vessel infarcts could have this appearance, the orientation of this lesion is concerning for demyelination. MRI of the brain with and without contrast is recommended to assess for multiple sclerosis. 3. Bilateral carotid bifurcation atherosclerosis with less than 50% stenosis.  MR BRAIN WO CONTRAST 07/14/2018 IMPRESSION: 1. No emergent finding. 2. Small remote lacunar infarcts in the deep cerebral white matter.  ECHOCARDIOGRAM 07/14/2018 IMPRESSIONS  1. The left ventricle has normal systolic function with an ejection fraction of 60-65%. The cavity size was normal. Left ventricular diastolic Doppler parameters are consistent with impaired relaxation. No evidence of left ventricular regional wall  motion abnormalities.  2. The right ventricle has normal systolic function. The cavity was normal. There is no increase in right ventricular wall thickness. Right ventricular systolic pressure could not be assessed.     ASSESSMENT: Jacqueline [name] is a 69 y.o. year old female here with right brain TIA given risk factors versus complicated migraine on 09/11/8001 after presenting with 3 episodes of intermittent  numbness/tingling in left arm and face x3 days followed by headache.  Return to ED the evening after discharge due to return of symptoms with diagnosis of likely complicated migraine with Topamax initiated.  Vascular risk factors include HTN, HLD, uncontrolled DM, obesity and prior stroke on imaging.  She is only had 2 additional migraines since second hospitalization but has not had any recent migraines with ongoing use of Topamax.    PLAN:  1. ?  Right brain TIA: Continue aspirin 81 mg daily  and restart atorvastatin for secondary stroke prevention. Maintain strict control of hypertension with blood pressure goal below 130/90, diabetes with hemoglobin A1c goal below 6.5% and cholesterol with LDL cholesterol (bad cholesterol) goal below 70 mg/dL.  I also advised the patient to eat a healthy diet with plenty of whole grains, cereals, fruits and vegetables, exercise regularly with at least 30 minutes of continuous activity daily and maintain ideal body weight. 2. Complicated migraine: Continue Topamax 50 mg twice daily for migraine prophylaxis.  If no additional migraines at follow-up visit, will consider decreasing dosage with goal of discontinuing if able 3. HTN: Advised to continue current treatment regimen.  Today's BP stable.  Advised to monitor at home along with continued follow-up with PCP for management 4. HLD: Advised to continue current treatment regimen along with continued follow-up with PCP for future prescribing and monitoring of lipid panel.  Lipid panel will be obtained to ensure improvement of LDL with increasing atorvastatin dosage -request ongoing monitoring by PCP 5. DMII: Continue to follow with endocrinology for improvement of DM    Follow up in 4 months or call earlier if needed   Greater than 50% of time during this 25 minute visit was spent on counseling, explanation of diagnosis of TIA versus complicated migraine, reviewing risk factor management of HTN, HLD and DM,  planning of further management along with potential future management, discussion regarding ongoing migraine management and answered all questions to patient satisfaction   Frann Rider, [facility]  [facility] [address] [place], [place] [address]  Phone [phone] Fax [phone] Note: This document was prepared with digital dictation and  possible smart Company secretary. Any transcriptional errors that result from this process are unintentional.

## 2018-11-16 NOTE — Patient Instructions (Addendum)
Continue clopidogrel 75 mg daily  and Lipitor  for secondary stroke prevention  Continue to follow up with PCP regarding cholesterol and blood pressure management   Continue to follow with endocrinology for diabetic management  Continue topamax 50mg  twice daily for headache management   We will check cholesterol levels today to ensure adequate management and improvement with dosage increase  Continue to monitor blood pressure at home  Maintain strict control of hypertension with blood pressure goal below 130/90, diabetes with hemoglobin A1c goal below 6.5% and cholesterol with LDL cholesterol (bad cholesterol) goal below 70 mg/dL. I also advised the patient to eat a healthy diet with plenty of whole grains, cereals, fruits and vegetables, exercise regularly and maintain ideal body weight.  Followup in the future with me in 4 months or call earlier if needed       Thank you for coming to see [place] at [facility]. I hope we have been able to provide you high quality care today.  You may receive a patient satisfaction survey over the next few weeks. We would appreciate your feedback and comments so that we may continue to improve ourselves and the health of our patients.

## 2018-11-17 LAB — LIPID PANEL
Chol/HDL Ratio: 3 ratio (ref 0.0–4.4)
Cholesterol, Total: 107 mg/dL (ref 100–199)
HDL: 36 mg/dL — ABNORMAL LOW (ref >39)
LDL Chol Calc (NIH): 49 mg/dL (ref 0–99)
Triglycerides: 122 mg/dL (ref 0–149)
VLDL Cholesterol Cal: 22 mg/dL (ref 5–40)

## 2018-11-17 NOTE — Progress Notes (Signed)
I agree with the above plan 

## 2018-11-26 ENCOUNTER — Telehealth: Payer: Self-pay | Admitting: Adult Health

## 2018-11-26 NOTE — Telephone Encounter (Signed)
Pt is needing to speak to provider or RN to discuss her medications. Please advise.

## 2018-11-29 NOTE — Telephone Encounter (Signed)
Pt calling relating to stating medication she is on, causing UTI, burning when urinating.  She thought this was [place].  I relayed that she had been on this previously, and dose is 50mg  BID.  She stated she started on jardiance.  She will call pharmacy or MD who prescribed jardiance and see if this is the medication.  She will call back as needed.

## 2018-12-22 ENCOUNTER — Encounter (HOSPITAL_BASED_OUTPATIENT_CLINIC_OR_DEPARTMENT_OTHER): Payer: Self-pay

## 2018-12-22 ENCOUNTER — Other Ambulatory Visit: Payer: Self-pay

## 2018-12-22 ENCOUNTER — Emergency Department (HOSPITAL_BASED_OUTPATIENT_CLINIC_OR_DEPARTMENT_OTHER)
Admission: EM | Admit: 2018-12-22 | Discharge: 2018-12-23 | Disposition: A | Discharge status: Home / Self Care | Payer: Medicare HMO | Attending: Emergency Medicine | Admitting: Emergency Medicine

## 2018-12-22 ENCOUNTER — Emergency Department (HOSPITAL_BASED_OUTPATIENT_CLINIC_OR_DEPARTMENT_OTHER): Payer: Medicare HMO

## 2018-12-22 DIAGNOSIS — I10 Essential (primary) hypertension: Secondary | ICD-10-CM | POA: Diagnosis not present

## 2018-12-22 DIAGNOSIS — E119 Type 2 diabetes mellitus without complications: Secondary | ICD-10-CM | POA: Insufficient documentation

## 2018-12-22 DIAGNOSIS — R319 Hematuria, unspecified: Secondary | ICD-10-CM | POA: Diagnosis present

## 2018-12-22 DIAGNOSIS — Z794 Long term (current) use of insulin: Secondary | ICD-10-CM | POA: Diagnosis not present

## 2018-12-22 DIAGNOSIS — Z7982 Long term (current) use of aspirin: Secondary | ICD-10-CM | POA: Diagnosis not present

## 2018-12-22 DIAGNOSIS — R310 Gross hematuria: Secondary | ICD-10-CM

## 2018-12-22 DIAGNOSIS — Z79899 Other long term (current) drug therapy: Secondary | ICD-10-CM | POA: Insufficient documentation

## 2018-12-22 LAB — COMPREHENSIVE METABOLIC PANEL
ALT: 20 U/L (ref 0–44)
AST: 18 U/L (ref 15–41)
Albumin: 4.5 g/dL (ref 3.5–5.0)
Alkaline Phosphatase: 64 U/L (ref 38–126)
Anion gap: 12 (ref 5–15)
BUN: 18 mg/dL (ref 8–23)
CO2: 23 mmol/L (ref 22–32)
Calcium: 9.5 mg/dL (ref 8.9–10.3)
Chloride: 106 mmol/L (ref 98–111)
Creatinine, Ser: 0.83 mg/dL (ref 0.44–1.00)
GFR calc Af Amer: >60 mL/min (ref >60)
GFR calc non Af Amer: >60 mL/min (ref >60)
Glucose, Bld: 172 mg/dL — ABNORMAL HIGH (ref 70–99)
Potassium: 4.7 mmol/L (ref 3.5–5.1)
Sodium: 141 mmol/L (ref 135–145)
Total Bilirubin: 0.5 mg/dL (ref 0.3–1.2)
Total Protein: 7.5 g/dL (ref 6.5–8.1)

## 2018-12-22 LAB — CBC WITH DIFFERENTIAL/PLATELET
Abs Immature Granulocytes: 0.04 10*3/uL (ref 0.00–0.07)
Basophils Absolute: 0 10*3/uL (ref 0.0–0.1)
Basophils Relative: 0 %
Eosinophils Absolute: 0.3 10*3/uL (ref 0.0–0.5)
Eosinophils Relative: 2 %
HCT: 46.6 % — ABNORMAL HIGH (ref 36.0–46.0)
Hemoglobin: 14.9 g/dL (ref 12.0–15.0)
Immature Granulocytes: 0 %
Lymphocytes Relative: 16 %
Lymphs Abs: 1.7 10*3/uL (ref 0.7–4.0)
MCH: 29.2 pg (ref 26.0–34.0)
MCHC: 32 g/dL (ref 30.0–36.0)
MCV: 91.4 fL (ref 80.0–100.0)
Monocytes Absolute: 0.8 10*3/uL (ref 0.1–1.0)
Monocytes Relative: 7 %
Neutro Abs: 8.3 10*3/uL — ABNORMAL HIGH (ref 1.7–7.7)
Neutrophils Relative %: 75 %
Platelets: 223 10*3/uL (ref 150–400)
RBC: 5.1 MIL/uL (ref 3.87–5.11)
RDW: 12.2 % (ref 11.5–15.5)
WBC: 11.1 10*3/uL — ABNORMAL HIGH (ref 4.0–10.5)
nRBC: 0 % (ref 0.0–0.2)

## 2018-12-22 LAB — URINALYSIS, ROUTINE W REFLEX MICROSCOPIC

## 2018-12-22 LAB — URINALYSIS, MICROSCOPIC (REFLEX): RBC / HPF: >50 RBC/hpf (ref 0–5)

## 2018-12-22 MED ORDER — SULFAMETHOXAZOLE-TRIMETHOPRIM 800-160 MG PO TABS: 1.0000 | ORAL_TABLET | Freq: Two times a day (BID) | ORAL | 0 refills | Status: AC

## 2018-12-22 MED ORDER — SULFAMETHOXAZOLE-TRIMETHOPRIM 800-160 MG PO TABS
1.0000 | ORAL_TABLET | Freq: Once | ORAL | Status: AC
  Administered 2018-12-23: 1 via ORAL
  Filled 2018-12-22: qty 1

## 2018-12-22 NOTE — ED Notes (Signed)
Triage by A Adkins, RN 

## 2018-12-22 NOTE — ED Triage Notes (Signed)
Pt c/o hematuria and bil back pain x 1 day

## 2018-12-23 ENCOUNTER — Encounter (HOSPITAL_BASED_OUTPATIENT_CLINIC_OR_DEPARTMENT_OTHER): Payer: Self-pay | Admitting: Emergency Medicine

## 2018-12-23 NOTE — ED Notes (Signed)
ED Provider at bedside. 

## 2018-12-23 NOTE — ED Provider Notes (Signed)
[facility] EMERGENCY DEPARTMENT Provider Note   CSN: [id number] Arrival date & time: 12/22/18  2130     History   Chief Complaint Chief Complaint  Patient presents with   Hematuria    HPI SHANTAL ROAN is a 68 y.o. female.     The history is provided by the patient.  Hematuria This is a new problem. The current episode started yesterday. The problem occurs constantly. The problem has not changed since onset.Pertinent negatives include no chest pain, no abdominal pain, no headaches and no shortness of breath. Nothing aggravates the symptoms. Nothing relieves the symptoms. Treatments tried: cranberry pills. The treatment provided no relief.  Gross hematuria x 1 day. No trauma.  There is pressure with voiding but no dysuria.  No f/c/r.  No flank pain.   Past Medical History:  Diagnosis Date   Diabetes mellitus    HLD (hyperlipidemia)    Hypertension    Nephrolithiasis    Obese     Patient Active Problem List   Diagnosis Date Noted   TIA (transient ischemic attack) 07/13/2018   Left-sided weakness 07/13/2018   Morbid obesity ([place]) 11/25/2013   HLD (hyperlipidemia) 11/25/2013   DM type 2 (diabetes mellitus, type 2) ([place]) 11/25/2013   Chest pain 11/04/2012    Past Surgical History:  Procedure Laterality Date   ROTATOR CUFF REPAIR  2009   TUBAL LIGATION  1979     OB History   No obstetric history on file.      Home Medications    Prior to Admission medications   Medication Sig Start Date End Date Taking? Authorizing Provider  aspirin EC 81 MG EC tablet Take 1 tablet (81 mg total) by mouth daily. 07/15/18   Geradine Girt, DO  atorvastatin (LIPITOR) 80 MG tablet Take 1 tablet (80 mg total) by mouth daily at 6 PM. 11/16/18   Frann Rider, NP  Blood Glucose Monitoring Suppl (ACCU-CHEK GUIDE ME) w/Device KIT 1 kit by Does not apply route daily. 10/04/18   Shamleffer, Melanie Crazier, MD  empagliflozin (JARDIANCE) 10 MG TABS tablet Take 10  mg by mouth daily before breakfast. 10/06/18   Shamleffer, Melanie Crazier, MD  glucose blood (ACCU-CHEK GUIDE) test strip Use as instructed to test blood sugars 3 times daily E11.65 10/04/18   Shamleffer, Melanie Crazier, MD  Insulin Detemir (LEVEMIR) 100 UNIT/ML Pen Inject 10 Units into the skin daily. Patient taking differently: Inject 20 Units into the skin daily.  07/14/18   Geradine Girt, DO  Insulin Pen Needle 32G X 4 MM MISC Use daily 07/14/18   Eulogio Bear U, DO  Lancets (ACCU-CHEK MULTICLIX) lancets Use as instructed to test blood sugars 3 times daily E11.65 10/04/18   Shamleffer, Melanie Crazier, MD  metFORMIN (GLUCOPHAGE) 1000 MG tablet Take 1,000 mg by mouth 2 (two) times daily with a meal.     [provider]  sulfamethoxazole-trimethoprim (BACTRIM DS) 800-160 MG tablet Take 1 tablet by mouth 2 (two) times daily for 7 days. 12/22/18 12/29/18  Nickoles Gregori, MD  topiramate (TOPAMAX) 50 MG tablet Take 1 tablet (50 mg total) by mouth 2 (two) times daily. 11/16/18   Frann Rider, NP    Family History Family History  Problem Relation Age of Onset   Diabetes Father    Heart attack Father 44   Heart disease Mother        hx cabg at 22   Heart attack Brother     Social History Social History  Tobacco Use   Smoking status: Never Smoker   Smokeless tobacco: Never Used  Substance Use Topics   Alcohol use: No   Drug use: No     Allergies   Augmentin [amoxicillin-pot clavulanate], Definity [perflutren lipid microsphere], Novocain [procaine], and Promethazine   Review of Systems Review of Systems  Constitutional: Negative for fever.  HENT: Negative for congestion.   Eyes: Negative for visual disturbance.  Respiratory: Negative for shortness of breath.   Cardiovascular: Negative for chest pain.  Gastrointestinal: Negative for abdominal pain.  Genitourinary: Positive for hematuria. Negative for dysuria.  Neurological: Negative for headaches.    Psychiatric/Behavioral: Negative for agitation.  All other systems reviewed and are negative.    Physical Exam Updated Vital Signs BP (!) 148/78 (BP Location: Right Arm)    Pulse 80    Temp 98.5 F (36.9 C) (Oral)    Resp 20    Ht 5' 5"  (1.651 m)    Wt 85.7 kg    SpO2 99%    BMI 31.45 kg/m   Physical Exam Vitals signs and nursing note reviewed.  Constitutional:      General: She is not in acute distress.    Appearance: Normal appearance.  HENT:     Head: Normocephalic and atraumatic.     Nose: Nose normal.  Eyes:     Conjunctiva/sclera: Conjunctivae normal.     Pupils: Pupils are equal, round, and reactive to light.  Neck:     Musculoskeletal: Normal range of motion and neck supple.  Cardiovascular:     Rate and Rhythm: Normal rate and regular rhythm.     Pulses: Normal pulses.     Heart sounds: Normal heart sounds.  Pulmonary:     Effort: Pulmonary effort is normal.     Breath sounds: Normal breath sounds.  Abdominal:     General: Abdomen is flat. Bowel sounds are normal.     Tenderness: There is no abdominal tenderness. There is no guarding or rebound.  Musculoskeletal: Normal range of motion.  Skin:    General: Skin is warm and dry.     Capillary Refill: Capillary refill takes less than 2 seconds.  Neurological:     General: No focal deficit present.     Mental Status: She is alert and oriented to person, place, and time.     Deep Tendon Reflexes: Reflexes normal.  Psychiatric:        Mood and Affect: Mood normal.        Behavior: Behavior normal.      ED Treatments / Results  Labs (all labs ordered are listed, but only abnormal results are displayed) Labs Reviewed  URINALYSIS, ROUTINE W REFLEX MICROSCOPIC - Abnormal; Notable for the following components:      Result Value   Color, Urine BROWN (*)    APPearance TURBID (*)    Glucose, UA   (*)    Value: TEST NOT REPORTED DUE TO COLOR INTERFERENCE OF URINE PIGMENT   Hgb urine dipstick   (*)    Value:  TEST NOT REPORTED DUE TO COLOR INTERFERENCE OF URINE PIGMENT   Bilirubin Urine   (*)    Value: TEST NOT REPORTED DUE TO COLOR INTERFERENCE OF URINE PIGMENT   Ketones, ur   (*)    Value: TEST NOT REPORTED DUE TO COLOR INTERFERENCE OF URINE PIGMENT   Protein, ur   (*)    Value: TEST NOT REPORTED DUE TO COLOR INTERFERENCE OF URINE PIGMENT   Nitrite   (*)  Value: TEST NOT REPORTED DUE TO COLOR INTERFERENCE OF URINE PIGMENT   Leukocytes,Ua   (*)    Value: TEST NOT REPORTED DUE TO COLOR INTERFERENCE OF URINE PIGMENT   All other components within normal limits  URINALYSIS, MICROSCOPIC (REFLEX) - Abnormal; Notable for the following components:   Bacteria, UA FEW (*)    All other components within normal limits  CBC WITH DIFFERENTIAL/PLATELET - Abnormal; Notable for the following components:   WBC 11.1 (*)    HCT 46.6 (*)    Neutro Abs 8.3 (*)    All other components within normal limits  COMPREHENSIVE METABOLIC PANEL - Abnormal; Notable for the following components:   Glucose, Bld 172 (*)    All other components within normal limits  URINE CULTURE  URINE CULTURE    EKG None  Radiology Ct Renal Stone Study  Result Date: 12/22/2018 CLINICAL DATA:  Left-sided flank pain with hematuria for several hours EXAM: CT ABDOMEN AND PELVIS WITHOUT CONTRAST TECHNIQUE: Multidetector CT imaging of the abdomen and pelvis was performed following the standard protocol without IV contrast. COMPARISON:  09/10/2010 FINDINGS: Lower chest: No acute abnormality. Hepatobiliary: No focal liver abnormality is seen. No gallstones, gallbladder wall thickening, or biliary dilatation. Pancreas: Unremarkable. No pancreatic ductal dilatation or surrounding inflammatory changes. Spleen: Normal in size without focal abnormality. Adrenals/Urinary Tract: Adrenal glands are within normal limits bilaterally. The kidneys are well visualized bilaterally. A small 2-3 mm stone is noted in the lower pole of the left kidney. Fullness  of the left renal collecting system and proximal left ureter are noted similar to that seen on the prior exam. Some increased attenuation of the urine is noted consistent with the given clinical history of hematuria. No distal obstructive changes are seen. No ureteral stones are noted. The bladder is partially distended. Again the density of the urine is consistent with the hematuria. No definitive mass is seen. Stomach/Bowel: No obstructive or inflammatory changes of the colon are seen. The appendix is within normal limits. No small bowel abnormality is noted. The stomach is unremarkable. Vascular/Lymphatic: Aortic atherosclerosis. No enlarged abdominal or pelvic lymph nodes. Reproductive: Uterus and bilateral adnexa are unremarkable. Other: Small fat containing umbilical hernia is noted. No ascites is seen. Musculoskeletal: No acute or significant osseous findings. IMPRESSION: Increased density within the left renal collecting system and bladder consistent with the given clinical history of hematuria. Tiny nonobstructing left renal stone is noted in the lower pole. No other focal abnormality is seen. Electronically Signed   By: Inez Catalina M.D.   On: 12/22/2018 23:49    Procedures Procedures (including critical care time)  Medications Ordered in ED Medications  sulfamethoxazole-trimethoprim (BACTRIM DS) 800-160 MG per tablet 1 tablet (1 tablet Oral Given 12/23/18 0005)       I will start an antibiotics but I see no convincing evidence at this time.  A culture was sent as well.  Gross hematuria in someone over the age of 81 is concerning and needs a further work up, as she will almost certainly need cystoscopy and biopsy as no official cause has been found in the ED.  I will refer to urology for further work up of this condition.  I have informed the patient we will try antibiotics and this may help but I am not convinced and she will need to follow up with urology for ongoing care.  Patient verbalizes  understanding and agrees to follow up.    [name] was evaluated in Emergency Department  on 12/23/2018 for the symptoms described in the history of present illness. She was evaluated in the context of the global COVID-19 pandemic, which necessitated consideration that the patient might be at risk for infection with the SARS-CoV-2 virus that causes COVID-19. Institutional protocols and algorithms that pertain to the evaluation of patients at risk for COVID-19 are in a state of rapid change based on information released by regulatory bodies including the CDC and federal and state organizations. These policies and algorithms were followed during the patient's care in the ED.   Final Clinical Impressions(s) / ED Diagnoses   Final diagnoses:  Gross hematuria    Return for intractable cough, coughing up blood,fevers >100.4 unrelieved by medication, shortness of breath, intractable vomiting, chest pain, shortness of breath, weakness,numbness, changes in speech, facial asymmetry,abdominal pain, passing out,Inability to tolerate liquids or food, cough, altered mental status or any concerns. No signs of systemic illness or infection. The patient is nontoxic-appearing on exam and vital signs are within normal limits.   I have reviewed the triage vital signs and the nursing notes. Pertinent labs &imaging results that were available during my care of the patient were reviewed by me and considered in my medical decision making (see chart for details).  After history, exam, and medical workup I feel the patient has been appropriately medically screened and is safe for discharge home. Pertinent diagnoses were discussed with the patient. Patient was given return precautions ED Discharge Orders         Ordered    sulfamethoxazole-trimethoprim (BACTRIM DS) 800-160 MG tablet  2 times daily     12/22/18 2354           Preslynn Bier, MD 12/23/18 0101

## 2018-12-25 LAB — URINE CULTURE: Culture: >=100000 — AB

## 2018-12-26 ENCOUNTER — Telehealth: Payer: Self-pay | Admitting: *Deleted

## 2018-12-26 NOTE — Telephone Encounter (Signed)
Post ED Visit - Positive Culture Follow-up  Culture report reviewed by antimicrobial stewardship pharmacist: [place] Team []  Elenor Quinones, Pharm.D. []  Heide Guile, Pharm.D., BCPS AQ-ID []  Parks Neptune, Pharm.D., BCPS []  Alycia Rossetti, Pharm.D., BCPS []  [place], [place].D., BCPS, AAHIVP []  Legrand Como, Pharm.D., BCPS, AAHIVP [x]  Salome Arnt, PharmD, BCPS []  Johnnette Gourd, PharmD, BCPS []  Hughes Better, PharmD, BCPS []  Leeroy Cha, PharmD []  Laqueta Linden, PharmD, BCPS []  Albertina Parr, PharmD  [facility] Team []  Leodis Sias, PharmD []  Lindell Spar, PharmD []  Royetta Asal, PharmD []  Graylin Shiver, Rph []  Rema Fendt) Glennon Mac, PharmD []  Arlyn Dunning, PharmD []  Netta Cedars, PharmD []  Dia Sitter, PharmD []  Leone Haven, PharmD []  Gretta Arab, PharmD []  Theodis Shove, PharmD []  Peggyann Juba, PharmD []  Reuel Boom, PharmD   Positive urine culture Treated with Sulfamethoxazole-Trimethoprim, organism sensitive to the same and no further patient follow-up is required at this time.  Harlon Flor [facility] 12/26/2018, 10:16 AM

## 2018-12-27 ENCOUNTER — Ambulatory Visit (INDEPENDENT_AMBULATORY_CARE_PROVIDER_SITE_OTHER): Payer: Medicare HMO | Admitting: Family

## 2018-12-27 ENCOUNTER — Other Ambulatory Visit: Payer: Self-pay

## 2018-12-27 ENCOUNTER — Encounter: Payer: Self-pay | Admitting: Family

## 2018-12-27 VITALS — BP 141/69 | HR 72 | Temp 96.8°F | Resp 16 | Ht 65.4 in | Wt 191.0 lb

## 2018-12-27 DIAGNOSIS — N3001 Acute cystitis with hematuria: Secondary | ICD-10-CM

## 2018-12-27 DIAGNOSIS — G500 Trigeminal neuralgia: Secondary | ICD-10-CM

## 2018-12-27 DIAGNOSIS — R319 Hematuria, unspecified: Secondary | ICD-10-CM

## 2018-12-27 DIAGNOSIS — N760 Acute vaginitis: Secondary | ICD-10-CM

## 2018-12-27 DIAGNOSIS — G43909 Migraine, unspecified, not intractable, without status migrainosus: Secondary | ICD-10-CM

## 2018-12-27 DIAGNOSIS — E1159 Type 2 diabetes mellitus with other circulatory complications: Secondary | ICD-10-CM

## 2018-12-27 MED ORDER — FLUCONAZOLE 150 MG PO TABS: ORAL_TABLET | ORAL | 0 refills | Status: DC

## 2018-12-27 MED ORDER — ROSUVASTATIN CALCIUM 20 MG PO TABS: 20.0000 mg | ORAL_TABLET | Freq: Every day | ORAL | 3 refills | Status: DC

## 2018-12-27 NOTE — Patient Instructions (Addendum)
Please stop atorvastatin, start crestor 20mg  once daily.  Keep your upcoming appointment with Dr. Kelton Pillar.   Welcome to [facility]

## 2018-12-27 NOTE — Progress Notes (Signed)
Subjective:    Patient ID: Jacqueline [name], female    DOB: 05-Dec-1950, 68 y.o.   MRN: [id number]  HPI   Patient is a 68 yr old female who presents today to establish care.  Of note, she was seen in the ED on 12/22/18 due to gross hematuria. She was noted to have a mild leukocytosis (11.1K).   CT was performed which noted:    Increased density within the left renal collecting system and bladder consistent with the given clinical history of hematuria. Tiny nonobstructing left renal stone is noted in the lower pole. No other focal abnormality is seen.  Urine culture noted >100 K of E coli.  She was treated with Bactrim.  Culture was sensitive to bactrim. She was referred to Urology for consultation. She has an appointment tomorrow.  She still has some dysuria and frequency.  Notes some vaginal itching. Denies discharge.   DM2- reports that she was diagnosed around age 26. She is maintained on levemir,  Metformin, and jardiance.  Reports that her home readings around 140.    Lab Results  Component Value Date   HGBA1C 11.2 (H) 07/13/2018   Lab Results  Component Value Date   LDLCALC 49 11/16/2018   CREATININE 0.83 12/22/2018   Migraines- she is maintained on topamax. Initially diagnosed with trigeminal neuralgia.  She is followed by [facility].   Hyperlipidemia- maintained on lipitor, reports decreased energy since her dose was increased to 80 mg.  Lab Results  Component Value Date   CHOL 107 11/16/2018   HDL 36 (L) 11/16/2018   LDLCALC 49 11/16/2018   TRIG 122 11/16/2018   CHOLHDL 3.0 11/16/2018   Reports that she sometimes has some right mid thoracic back pain.  Reports that aspercreme helps.   Review of Systems  Constitutional:       Has lost 23 pounds with diet  HENT: Negative for hearing loss and rhinorrhea.   Eyes: Positive for visual disturbance (reports that she has intermittent diplopia ).  Respiratory: Negative for cough and shortness of breath.    Cardiovascular: Negative for chest pain.  Gastrointestinal:       + gerd/epigastric pain worse after coffee  Genitourinary: Positive for dysuria and frequency.  Musculoskeletal: Negative for arthralgias.  Skin: Negative for rash.  Neurological: Positive for headaches (rare mild headaches).  Hematological:       Reports palpable swollen gland in the back of her head  Psychiatric/Behavioral:       Denies depresssion/anxiety       Past Medical History:  Diagnosis Date  . Diabetes mellitus   . HLD (hyperlipidemia)   . Hypertension   . Nephrolithiasis   . Obese      Social History   Socioeconomic History  . Marital status: Married    Spouse name: Not on file  . Number of children: 4  . Years of education: Not on file  . Highest education level: Not on file  Occupational History  . Occupation: Retired-Missionary  Social Needs  . Financial resource strain: Not hard at all  . Food insecurity    Worry: Never true    Inability: Never true  . Transportation needs    Medical: No    Non-medical: No  Tobacco Use  . Smoking status: Never Smoker  . Smokeless tobacco: Never Used  Substance and Sexual Activity  . Alcohol use: No  . Drug use: No  . Sexual activity: Yes    Partners: Male  Birth control/protection: Post-menopausal  Lifestyle  . Physical activity    Days per week: 2 days    Minutes per session: 30 min  . Stress: Not on file  Relationships  . Social Herbalist on phone: Not on file    Gets together: Not on file    Attends religious service: Not on file    Active member of club or organization: Not on file    Attends meetings of clubs or organizations: Not on file    Relationship status: Not on file  . Intimate partner violence    Fear of current or ex partner: Not on file    Emotionally abused: Not on file    Physically abused: Not on file    Forced sexual activity: Not on file  Other Topics Concern  . Not on file  Social History Narrative   . Not on file    Past Surgical History:  Procedure Laterality Date  . ROTATOR CUFF REPAIR  2009  . TUBAL LIGATION  1979    Family History  Problem Relation Age of Onset  . Diabetes Father   . Heart attack Father 85  . Heart disease Mother        hx cabg at 57  . Heart attack Brother     Allergies  Allergen Reactions  . Augmentin [Amoxicillin-Pot Clavulanate] Other (See Comments)    Altered mental status Did it involve swelling of the face/tongue/throat, SOB, or low BP? Unknown Did it involve sudden or severe rash/hives, skin peeling, or any reaction on the inside of your mouth or nose? Unknown Did you need to seek medical attention at a hospital or doctor's office? Unknown When did it last happen? unk If all above answers are "NO", may proceed with cephalosporin use.   Marland Kitchen Definity [Perflutren Lipid Microsphere] Other (See Comments)    Headache and facial flushing after Definity IVP given. No SOB or Chest pain,and Vital signs stable. Irven Baltimore, RN.  Marland Kitchen Novocain [Procaine] Other (See Comments)    Altered mental status  . Promethazine Swelling    Tongue swelling    Current Outpatient Medications on File Prior to Visit  Medication Sig Dispense Refill  . aspirin EC 81 MG EC tablet Take 1 tablet (81 mg total) by mouth daily. 21 tablet 0  . atorvastatin (LIPITOR) 80 MG tablet Take 1 tablet (80 mg total) by mouth daily at 6 PM. 30 tablet 3  . Blood Glucose Monitoring Suppl (ACCU-CHEK GUIDE ME) w/Device KIT 1 kit by Does not apply route daily. 1 kit 0  . empagliflozin (JARDIANCE) 10 MG TABS tablet Take 10 mg by mouth daily before breakfast. 30 tablet 3  . glucose blood (ACCU-CHEK GUIDE) test strip Use as instructed to test blood sugars 3 times daily E11.65 100 each 12  . Insulin Detemir (LEVEMIR) 100 UNIT/ML Pen Inject 10 Units into the skin daily. (Patient taking differently: Inject 20 Units into the skin daily. ) 10 mL 0  . Insulin Pen Needle 32G X 4 MM MISC Use daily 30  each 0  . Lancets (ACCU-CHEK MULTICLIX) lancets Use as instructed to test blood sugars 3 times daily E11.65 100 each 12  . metFORMIN (GLUCOPHAGE) 1000 MG tablet Take 1,000 mg by mouth 2 (two) times daily with a meal.     . sulfamethoxazole-trimethoprim (BACTRIM DS) 800-160 MG tablet Take 1 tablet by mouth 2 (two) times daily for 7 days. 14 tablet 0  . topiramate (TOPAMAX) 50 MG tablet  Take 1 tablet (50 mg total) by mouth 2 (two) times daily. 60 tablet 5   No current facility-administered medications on file prior to visit.     BP (!) 141/69 (BP Location: Right Arm, Patient Position: Sitting, Cuff Size: Large)   Pulse 72   Temp (!) 96.8 F (36 C) (Temporal)   Resp 16   Ht 5' 5.4" (1.661 m)   Wt 191 lb (86.6 kg)   SpO2 98%   BMI 31.40 kg/m    Objective:   Physical Exam Constitutional:      Appearance: She is well-developed.  HENT:     Head: Normocephalic and atraumatic.  Neck:     Musculoskeletal: Neck supple.     Thyroid: No thyromegaly.  Cardiovascular:     Rate and Rhythm: Normal rate and regular rhythm.     Heart sounds: Normal heart sounds. No murmur.  Pulmonary:     Effort: Pulmonary effort is normal. No respiratory distress.     Breath sounds: Normal breath sounds. No wheezing.  Musculoskeletal:        General: No swelling.  Skin:    General: Skin is warm and dry.     Coloration: Skin is not jaundiced.     Comments: No rash  Neurological:     Mental Status: She is alert and oriented to person, place, and time.  Psychiatric:        Behavior: Behavior normal.        Thought Content: Thought content normal.        Judgment: Judgment normal.           Assessment & Plan:  Hematuria- resolved, encouraged pt to keep her upcoming appointment with urology.  UTI- improved, continue bactrim.  Migraines/Trigeminal neuralgia- clinically stable.  Management per [place] neurology.   Hyperlipidemia- notes fatigue with increased dose of lipitor. D/c lipitor. Trial  of crestor.   Vaginitis- most likely yeast. Will rx with diflucan.   DM2- reports improved home readings. Being managed by endocrinology.

## 2018-12-29 ENCOUNTER — Other Ambulatory Visit: Payer: Self-pay

## 2018-12-29 DIAGNOSIS — R319 Hematuria, unspecified: Secondary | ICD-10-CM | POA: Insufficient documentation

## 2018-12-29 DIAGNOSIS — G500 Trigeminal neuralgia: Secondary | ICD-10-CM | POA: Insufficient documentation

## 2018-12-29 DIAGNOSIS — G43909 Migraine, unspecified, not intractable, without status migrainosus: Secondary | ICD-10-CM | POA: Insufficient documentation

## 2018-12-29 MED ORDER — INSULIN DETEMIR 100 UNIT/ML FLEXPEN: 10.0000 [IU] | PEN_INJECTOR | Freq: Every day | SUBCUTANEOUS | 0 refills | Status: DC

## 2018-12-29 NOTE — Telephone Encounter (Signed)
MEDICATION: Insulin Detemir (LEVEMIR) 100 UNIT/ML Pen  PHARMACY:  [id number] - [place], [place] - [address]  IS THIS A 90 DAY SUPPLY :   IS PATIENT OUT OF MEDICATION:   IF NOT; HOW MUCH IS LEFT:   LAST APPOINTMENT DATE: @9 /04/2018  NEXT APPOINTMENT DATE:@11 /23/2020  DO WE HAVE YOUR PERMISSION TO LEAVE A DETAILED MESSAGE:  OTHER COMMENTS:    **Let patient know to contact pharmacy at the end of the day to make sure medication is ready. **  ** Please notify patient to allow 48-72 hours to process**  **Encourage patient to contact the pharmacy for refills or they can request refills through [facility]**

## 2018-12-29 NOTE — Telephone Encounter (Signed)
Dr. Maretta Bees please advise if you will be taking over levemir rx. Last filled 07/14/18 by Dr. Mike Gip, qty:58ml 0 refills

## 2018-12-30 ENCOUNTER — Other Ambulatory Visit: Payer: Self-pay | Admitting: Internal Medicine

## 2018-12-30 ENCOUNTER — Other Ambulatory Visit: Payer: Self-pay

## 2018-12-31 ENCOUNTER — Other Ambulatory Visit: Payer: Self-pay | Admitting: Internal Medicine

## 2018-12-31 MED ORDER — INSULIN PEN NEEDLE 32G X 4 MM MISC: 2 refills | Status: DC

## 2019-01-03 ENCOUNTER — Ambulatory Visit (INDEPENDENT_AMBULATORY_CARE_PROVIDER_SITE_OTHER): Payer: Medicare HMO | Admitting: Internal Medicine

## 2019-01-03 ENCOUNTER — Encounter: Payer: Self-pay | Admitting: Internal Medicine

## 2019-01-03 VITALS — BP 124/68 | HR 68 | Temp 97.7°F | Ht 65.0 in | Wt 187.0 lb

## 2019-01-03 DIAGNOSIS — E1159 Type 2 diabetes mellitus with other circulatory complications: Secondary | ICD-10-CM

## 2019-01-03 LAB — POCT GLYCOSYLATED HEMOGLOBIN (HGB A1C): Hemoglobin A1C: 6.6 % — AB (ref 4.0–5.6)

## 2019-01-03 LAB — GLUCOSE, POCT (MANUAL RESULT ENTRY): POC Glucose: 133 mg/dl — AB (ref 70–99)

## 2019-01-03 MED ORDER — INSULIN DETEMIR 100 UNIT/ML FLEXPEN: 20.0000 [IU] | PEN_INJECTOR | Freq: Every day | SUBCUTANEOUS | 3 refills | Status: DC

## 2019-01-03 NOTE — Progress Notes (Signed)
Name: Jacqueline [name]  Age/ Sex: 68 y.o., female   MRN/ DOB: [id number], 28-Nov-1950     PCP: [name], NP   Reason for Endocrinology Evaluation: Type 2 Diabetes Mellitus  Initial Endocrine Consultative Visit: 10/04/2018    PATIENT IDENTIFIER: [name] is a 68 y.o. female with a past medical history of HTN, DM, HLD and migraine headaches. The patient has followed with Endocrinology clinic since 10/04/2018 for consultative assistance with management of her diabetes.  DIABETIC HISTORY:  Jacqueline [name] was diagnosed with DM many years ago, she has been on metformin and glipizide for years, insulin started 07/2018. She was prescribed Victoza at some point but she did not start it as her sister had a bad experience with it. Her hemoglobin A1c has ranged from 7.0's % , peaking at 11.2 %in 2020.  On her initial visit to our clinic, her A1c was 11.2%. She was on Levemir and metformin . We added [place] and stopped Glipizide. Due to hematuria she stopped Jardiance   SUBJECTIVE:   During the last visit (10/04/2018): A1c 11.2%. She was on Levemir and metformin . We added [place] and stopped Glipizide.    Today (01/03/2019): Jacqueline [name] is here for a follow up on diabetes.  She checks her blood sugars 1 times daily, preprandial to breakfast. The patient has not had hypoglycemic episodes since the last clinic visit. Otherwise, the patient has not required any recent emergency interventions for hypoglycemia and has not had recent hospitalizations secondary to hyper or hypoglycemic episodes.   She did have an ED visit a few weeks ago for hematuria, she is being evaluated for renal stones  Brother with cancer.     ROS: As per HPI and as detailed below: Review of Systems  Respiratory: Negative for cough and shortness of breath.   Cardiovascular: Negative for chest pain and palpitations.  Gastrointestinal: Negative for diarrhea and nausea.  Genitourinary: Negative for frequency and  urgency.  Endo/Heme/Allergies: Negative for polydipsia.      HOME DIABETES REGIMEN:  Jardiance 10 mg daily with Breakfast - stopped due to recent hematuria  Levemir 20 units daily - she takes 10 units  Metformin 1000 mg Twice a day     METER DOWNLOAD SUMMARY: Did not bring    HISTORY:  Past Medical History:  Past Medical History:  Diagnosis Date  . Diabetes mellitus   . HLD (hyperlipidemia)   . Hypertension   . Nephrolithiasis   . Obese    Past Surgical History:  Past Surgical History:  Procedure Laterality Date  . ROTATOR CUFF REPAIR  2009  . TUBAL LIGATION  1979    Social History:  reports that she has never smoked. She has never used smokeless tobacco. She reports that she does not drink alcohol or use drugs. Family History:  Family History  Problem Relation Age of Onset  . Diabetes Father   . Heart attack Father 15  . Heart disease Mother        hx cabg at 39  . Diabetes type II Brother   . Parkinson's disease Sister   . Brain cancer Sister   . Pancreatic cancer Brother   . Diabetes Mellitus II Sister   . Diabetes Mellitus II Sister   . CAD Sister   . Diabetes Mellitus II Sister   . Diabetes Mellitus II Sister   . Osteoarthritis Daughter   . AAA (abdominal aortic aneurysm) Daughter      HOME MEDICATIONS: Allergies as of  01/03/2019      Reactions   Augmentin [amoxicillin-pot Clavulanate] Other (See Comments)   Altered mental status Did it involve swelling of the face/tongue/throat, SOB, or low BP? Unknown Did it involve sudden or severe rash/hives, skin peeling, or any reaction on the inside of your mouth or nose? Unknown Did you need to seek medical attention at a hospital or doctor's office? Unknown When did it last happen? unk If all above answers are "NO", may proceed with cephalosporin use.   Definity [perflutren Lipid Microsphere] Other (See Comments)   Headache and facial flushing after Definity IVP given. No SOB or Chest pain,and Vital  signs stable. Irven Baltimore, RN.   Novocain [procaine] Other (See Comments)   Altered mental status   Promethazine Swelling   Tongue swelling      Medication List       Accurate as of January 03, 2019  9:40 AM. If you have any questions, ask your nurse or doctor.        Accu-Chek Guide Me w/Device Kit 1 kit by Does not apply route daily.   Accu-Chek Guide test strip Generic drug: glucose blood Use as instructed to test blood sugars 3 times daily E11.65   accu-chek multiclix lancets Use as instructed to test blood sugars 3 times daily E11.65   aspirin 81 MG EC tablet Take 1 tablet (81 mg total) by mouth daily.   atorvastatin 80 MG tablet Commonly known as: LIPITOR Take 1 tablet (80 mg total) by mouth daily at 6 PM.   fluconazole 150 MG tablet Commonly known as: DIFLUCAN Take 1 tablet by mouth today, may repeat in 3 days if symptoms are not improved.   Insulin Detemir 100 UNIT/ML Pen Commonly known as: LEVEMIR Inject 20 Units into the skin daily. What changed: how much to take Changed by: Dorita Sciara, MD   Insulin Pen Needle 32G X 4 MM Misc Use daily   Jardiance 10 MG Tabs tablet Generic drug: empagliflozin Take 10 mg by mouth daily before breakfast.   metFORMIN 1000 MG tablet Commonly known as: GLUCOPHAGE Take 1,000 mg by mouth 2 (two) times daily with a meal.   rosuvastatin 20 MG tablet Commonly known as: Crestor Take 1 tablet (20 mg total) by mouth daily.   topiramate 50 MG tablet Commonly known as: TOPAMAX Take 1 tablet (50 mg total) by mouth 2 (two) times daily.        OBJECTIVE:   Vital Signs: BP 124/68 (BP Location: Right Arm, Patient Position: Sitting, Cuff Size: Large)   Pulse 68   Temp 97.7 F (36.5 C)   Ht 5' 5"  (1.651 m)   Wt 187 lb (84.8 kg)   SpO2 97%   BMI 31.12 kg/m   Wt Readings from Last 3 Encounters:  01/03/19 187 lb (84.8 kg)  12/27/18 191 lb (86.6 kg)  12/22/18 189 lb (85.7 kg)     Exam: General: Pt  appears well and is in NAD  Lungs: Clear with good BS bilat with no rales, rhonchi, or wheezes  Heart: RRR with normal S1 and S2 and no gallops; no murmurs; no rub  Abdomen: Normoactive bowel sounds, soft, nontender, without masses or organomegaly palpable  Extremities: No pretibial edema.  Skin: Normal texture and temperature to palpation.   Neuro: MS is good with appropriate affect, pt is alert and Ox3       DM foot exam: 10/04/18  The skin of the feet is intact without sores or ulcerations. The pedal  pulses are 2+ on right and 2+ on left. The sensation is intact to a screening 5.07, 10 gram monofilament bilaterally    DATA REVIEWED:  Lab Results  Component Value Date   HGBA1C 11.2 (H) 07/13/2018   Lab Results  Component Value Date   LDLCALC 49 11/16/2018   CREATININE 0.83 12/22/2018    Lab Results  Component Value Date   CHOL 107 11/16/2018   HDL 36 (L) 11/16/2018   LDLCALC 49 11/16/2018   TRIG 122 11/16/2018   CHOLHDL 3.0 11/16/2018         ASSESSMENT / PLAN / RECOMMENDATIONS:   1) Type 2 Diabetes Mellitus, Optimally  controlled, With Macrovascular  complications - Most recent A1c of 6.6 %. Goal A1c < 7.0 %.    - Praised the patient on the dramatic improvement in glycemic control. - She was encouraged to bring meter on next visit but she assures me there's no hypoglycemic episodes.  - She is being evaluated for hematuria, and has a pending CT scan for nephrolithiasis, hence will hold off on the jardiance.    MEDICATIONS:  Levemir 20 units daily   Metformin 1000 mg BID   EDUCATION / INSTRUCTIONS:  BG monitoring instructions: Patient is instructed to check her blood sugars 2 times a day, fasting and bedtime .  Call [facility] if: BG persistently < 70 or > 300. . I reviewed the Rule of 15 for the treatment of hypoglycemia in detail with the patient. Literature supplied.     F/U in 4 months   Signed electronically by: Mack Guise, MD  [facility]  [facility] [address] [place], [place] [address] Phone: [phone] FAX: [phone]   CC: [name], [address] [place] [address] Phone: [phone]  Fax: [phone]  Return to Endocrinology clinic as below: Future Appointments  Date Time Provider [place]  02/15/2019  8:00 AM [name], NP [facility]  03/22/2019 10:45 AM Frann Rider, NP [facility] None

## 2019-01-03 NOTE — Patient Instructions (Signed)
-   Stay OFF Jardiance - Increase  Levemir to 20 units daily  - Continue Metformin Twice a day    - Check sugar before breakfast and Bedtime   - Choose healthy, lower carb lower calorie snacks: toss salad, vegetables, cottage cheese, peanut butter, low fat cheese / string cheese, lower sodium deli meat, tuna salad or chicken salad     HOW TO TREAT LOW BLOOD SUGARS (Blood sugar LESS THAN 70 MG/DL)  Please follow the RULE OF 15 for the treatment of hypoglycemia treatment (when your (blood sugars are less than 70 mg/dL)    STEP 1: Take 15 grams of carbohydrates when your blood sugar is low, which includes:   3-4 GLUCOSE TABS  OR  3-4 OZ OF JUICE OR REGULAR SODA OR  ONE TUBE OF GLUCOSE GEL     STEP 2: RECHECK blood sugar in 15 MINUTES STEP 3: If your blood sugar is still low at the 15 minute recheck --> then, go back to STEP 1 and treat AGAIN with another 15 grams of carbohydrates.

## 2019-02-15 ENCOUNTER — Encounter: Payer: Medicare HMO | Admitting: Family

## 2019-03-02 ENCOUNTER — Other Ambulatory Visit: Payer: Self-pay

## 2019-03-02 ENCOUNTER — Ambulatory Visit (INDEPENDENT_AMBULATORY_CARE_PROVIDER_SITE_OTHER): Payer: Medicare HMO | Admitting: Family

## 2019-03-02 DIAGNOSIS — J069 Acute upper respiratory infection, unspecified: Secondary | ICD-10-CM

## 2019-03-02 DIAGNOSIS — R05 Cough: Secondary | ICD-10-CM

## 2019-03-02 DIAGNOSIS — R059 Cough, unspecified: Secondary | ICD-10-CM

## 2019-03-02 NOTE — Progress Notes (Signed)
Virtual Visit via Telephone Note  I connected with Jacqueline [name] on 03/02/19 at 11:20 AM EST by telephone and verified that I am speaking with the correct person using two identifiers.  Location: Patient: home Provider: work   I discussed the limitations, risks, security and privacy concerns of performing an evaluation and management service by telephone and the availability of in person appointments. I also discussed with the patient that there may be a patient responsible charge related to this service. The patient expressed understanding and agreed to proceed.   History of Present Illness:  Patient is a 69 yr old female who presents today with c/o body aches, cough, post-nasal drip.  Also having chills. Symptoms began on 1/16 Husband has been having similar symptoms.  She attended church in person on 1/10 and 1/13.  Did wear a mask.      Observations/Objective:   Gen: Awake, alert, no acute distress Resp: Breathing is even and non-labored Psych: calm/pleasant demeanor Neuro: Alert and Oriented x 3, + facial symmetry, speech is clear.    Assessment and Plan:  Viral Illness- strong suspicion for covid-19 infection. Recommended that she quarantine, schedule testing as below,  encouraged to monitor for any worsening symptoms; watch for increased shortness of breath, weakness, and signs of dehydration. Advised when to seek emergency care.  Instructed to rest and hydrate well. We discussed use of delsym prn cough, tylenol as needed for pain/fever.  Advised not to leave the house during recommended isolation period, only if it is necessary to seek medical care.   1.  text "covid" to [phone]    or   2.  Schedule at [email or web address]   or    3.  Call [phone]      Follow Up Instructions:    I discussed the assessment and treatment plan with the patient. The patient was provided an opportunity to ask questions and all were answered. The patient agreed with the plan  and demonstrated an understanding of the instructions.   The patient was advised to call back or seek an in-person evaluation if the symptoms worsen or if the condition fails to improve as anticipated.  I provided 15 minutes of non-face-to-face time during this encounter.   Nance Pear, NP

## 2019-03-03 ENCOUNTER — Ambulatory Visit: Payer: Medicare HMO | Attending: Internal Medicine

## 2019-03-03 DIAGNOSIS — Z20822 Contact with and (suspected) exposure to covid-19: Secondary | ICD-10-CM

## 2019-03-04 LAB — NOVEL CORONAVIRUS, NAA: SARS-CoV-2, NAA: NOT DETECTED

## 2019-03-08 ENCOUNTER — Encounter: Payer: Medicare HMO | Admitting: Family

## 2019-03-08 ENCOUNTER — Other Ambulatory Visit: Payer: Self-pay

## 2019-03-22 ENCOUNTER — Encounter: Payer: Self-pay | Admitting: Adult Health

## 2019-03-22 ENCOUNTER — Ambulatory Visit: Payer: Medicare HMO | Admitting: Adult Health

## 2019-03-22 ENCOUNTER — Other Ambulatory Visit: Payer: Self-pay

## 2019-03-22 VITALS — BP 132/84 | HR 65 | Temp 97.7°F | Ht 65.25 in | Wt 185.0 lb

## 2019-03-22 DIAGNOSIS — H8110 Benign paroxysmal vertigo, unspecified ear: Secondary | ICD-10-CM

## 2019-03-22 DIAGNOSIS — G459 Transient cerebral ischemic attack, unspecified: Secondary | ICD-10-CM

## 2019-03-22 DIAGNOSIS — G43109 Migraine with aura, not intractable, without status migrainosus: Secondary | ICD-10-CM | POA: Diagnosis not present

## 2019-03-22 DIAGNOSIS — E785 Hyperlipidemia, unspecified: Secondary | ICD-10-CM

## 2019-03-22 DIAGNOSIS — I10 Essential (primary) hypertension: Secondary | ICD-10-CM

## 2019-03-22 DIAGNOSIS — E1159 Type 2 diabetes mellitus with other circulatory complications: Secondary | ICD-10-CM

## 2019-03-22 MED ORDER — TOPIRAMATE 50 MG PO TABS: ORAL_TABLET | ORAL | 5 refills | Status: DC

## 2019-03-22 NOTE — Progress Notes (Signed)
[facility] [address] [place]. [name] [address] [phone]       STROKE FOLLOW UP NOTE  Jacqueline [name] Date of Birth:  05/22/1950 Medical Record Number:  [id number]   Reason for visit: f/u TIA versus complicated migraine 2-56 20    CHIEF COMPLAINT:  Chief Complaint  Patient presents with  . Follow-up    Tx Rm. alone. States that she has some dizziness.    HPI: Hospital admission 07/13/2018: SHAQUANA [name] is an 69 y.o. Caucasian female with PMH of hypertension, hyperlipidemia, obesity and diabetes who presented to [facility] on 07/14/2018 with 3 episode of left arm and face numbness tingling for the last 3 days.  Initial episode lasting only 3 minutes and then resolved but followed by headache with bifrontal throbbing was lasted approximately 3 minutes.  She attributed this to empty stomach.  Second episode occurred the next day lasting approximately 3 minutes and then accompanied by headache lasting approximately 3 minutes which occurred prior to lunch.  Third episode occurred while eating lunch with similar symptoms plus feeling heaviness of left arm which lasted about 5 minutes followed by mild brief headache.  Denies any prior history of migraine or headaches.  She does endorse having visual auras with flashing lights in the past.  Stroke work-up unremarkable and likely right brain TIA given risk factors versus complicated migraine due to headache following each episode.  Stroke work-up completed once transferred to [facility] ED.  MRI no acute stroke but did show small deep white matter infarcts.  Vessel imaging and 2D echo unremarkable.  LDL 119 and A1c 11.2.  Recommended DAPT for 3 weeks and aspirin alone.  HTN stable.  Initiated atorvastatin 40 mg daily for HLD management.  Recommended close PCP follow-up for uncontrolled DM.  Other stroke risk factors include advanced age, obesity and one episode of prior vision loss lasting 1 to 2 seconds approximately 3  to 4 years ago.  As all symptoms resolved without residual deficit, discharged home in stable condition without therapy needs. She was discharged in the afternoon on 07/14/2018 but returned to [facility] ED that night due to recurrence of numbness in left shoulder and headache.  CT head unremarkable.  Was consulted by neurology and felt her symptoms were likely related to complex migraine as extensive stroke work-up unremarkable.  Discharged on Topamax and recommend follow-up with neurology outpatient.  Initial visit 09/01/2018: Since discharge, she has experienced 2 additional headaches which were preceded by left arm numbness/tingling.  Symptoms similar to prior episodes.  She has continued on Topamax 50 mg nightly with only minimal daytime fatigue but did not limit her overall functioning.   She does endorse approximately 2 weeks prior, she woke up in the morning with tingling on her nose and swelling of top lip.  She took Benadryl throughout the day and resolved within 24 hours.  Symptoms consistent with allergic reaction to unknown modality as she did not change medications at that time, denies eating a different type of food or use of lip product.  Completed 3 weeks DAPT continues on aspirin alone without bleeding or bruising Previously taking atorvastatin 40 mg daily without side effects myalgias but has since run out of prescription Blood pressure 147/77 -does not routinely monitor at home Glucose levels uncontrolled and is in the process of finding new PCP versus establishing care with endocrinology due to continued difficulty with diabetic management No further concerns at this time  Update 11/16/2018: Jacqueline [name]  is being seen today for follow-up.  Continues on aspirin without bleeding or bruising.  At prior visit, lipid panel obtained with LDL 126 (prior 119).  Recommended to increase atorvastatin dose from 40 mg to 80 mg daily.  She continues on atorvastatin 80 mg daily without myalgias.  Lipid panels  have not been repeated since that time.  Blood pressure today 130/74.  Established care with endocrinology with adjustments made to medication regimen. Improvement of glucose levels per patient report.  She is also working on weight loss with prior office visit weight 208lbs and today's weight 193lb.  She has been working hard on improving diet and exercise.  Continues on Topamax 50 mg twice daily. Has not had any additional migraine with left arm numbness.  Will have occasional headaches which she believes is related to tension/stress but resolves without intervention.  No further concerns at this time.  Update 03/22/2019: Jacqueline [name] is a 69 year old female who is being seen today for stroke follow-up.  She has been doing well from a stroke standpoint since prior visit.  She does endorse chronic history of vertigo appearing to present as BPPV with symptoms occurring with quick head movement or when lying down and changing positions.  She has not previously seen physical therapy for this concern.  No reoccurring headaches with ongoing use of Topamax 50 mg twice daily denies side effects.  Continues on aspirin and atorvastatin for secondary stroke prevention of side effects.  Blood pressure today stable.  Denies new or worsening stroke/TIA symptoms.  Of note, patient does states she has been attempting to schedule appointment with PCP but due to illness with patient reporting cold-like symptoms she has been unable to be seen.  Recent COVID-19 testing negative.  Over the past couple days, she has been experiencing sharp chest pain intermittently in the midsternum radiating towards the back and into her left arm.  This only lasts for a short duration and is worsened with increased exertion.  She denies any shortness of breath, dizziness or lightheadedness.  She has previously been seen by cardiology but has not been seen recently.     ROS:   14 system review of systems performed and negative with exception of  chest pain and vertigo  PMH:  Past Medical History:  Diagnosis Date  . Diabetes mellitus   . HLD (hyperlipidemia)   . Hypertension   . Nephrolithiasis   . Obese     PSH:  Past Surgical History:  Procedure Laterality Date  . ROTATOR CUFF REPAIR  2009  . TUBAL LIGATION  1979    Social History:  Social History   Socioeconomic History  . Marital status: Married    Spouse name: Not on file  . Number of children: 4  . Years of education: Not on file  . Highest education level: Not on file  Occupational History  . Occupation: Retired-Missionary  Tobacco Use  . Smoking status: Never Smoker  . Smokeless tobacco: Never Used  Substance and Sexual Activity  . Alcohol use: No  . Drug use: No  . Sexual activity: Yes    Partners: Male    Birth control/protection: Post-menopausal  Other Topics Concern  . Not on file  Social History Narrative   Retired Media planner   Husband is a Theme park manager- does some missions in [place]   2 great grandchildren (live in [place])   Married 50 years   4 children- [place], 1  [place], 1 in [place], 1 at home  8 granchildren   Enjoys crafts   No pets   Social Determinants of Health   Financial Resource Strain: Low Risk   . Difficulty of Paying Living Expenses: Not hard at all  Food Insecurity: No Food Insecurity  . Worried About Charity fundraiser in the Last Year: Never true  . Ran Out of Food in the Last Year: Never true  Transportation Needs: No Transportation Needs  . Lack of Transportation (Medical): No  . Lack of Transportation (Non-Medical): No  Physical Activity: Insufficiently Active  . Days of Exercise per Week: 2 days  . Minutes of Exercise per Session: 30 min  Stress:   . Feeling of Stress : Not on file  Social Connections:   . Frequency of Communication with Friends and Family: Not on file  . Frequency of Social Gatherings with Friends and Family: Not on file  . Attends Religious Services: Not on file    . Active Member of Clubs or Organizations: Not on file  . Attends Archivist Meetings: Not on file  . Marital Status: Not on file  Intimate Partner Violence:   . Fear of Current or Ex-Partner: Not on file  . Emotionally Abused: Not on file  . Physically Abused: Not on file  . Sexually Abused: Not on file    Family History:  Family History  Problem Relation Age of Onset  . Diabetes Father   . Heart attack Father 64  . Heart disease Mother        hx cabg at 36  . Diabetes type II Brother   . Parkinson's disease Sister   . Brain cancer Sister   . Pancreatic cancer Brother   . Diabetes Mellitus II Sister   . Diabetes Mellitus II Sister   . CAD Sister   . Diabetes Mellitus II Sister   . Diabetes Mellitus II Sister   . Osteoarthritis Daughter   . AAA (abdominal aortic aneurysm) Daughter     Medications:   Current Outpatient Medications on File Prior to Visit  Medication Sig Dispense Refill  . aspirin EC 81 MG EC tablet Take 1 tablet (81 mg total) by mouth daily. 21 tablet 0  . atorvastatin (LIPITOR) 80 MG tablet Take 1 tablet (80 mg total) by mouth daily at 6 PM. 30 tablet 3  . Blood Glucose Monitoring Suppl (ACCU-CHEK GUIDE ME) w/Device KIT 1 kit by Does not apply route daily. 1 kit 0  . glucose blood (ACCU-CHEK GUIDE) test strip Use as instructed to test blood sugars 3 times daily E11.65 100 each 12  . Insulin Detemir (LEVEMIR) 100 UNIT/ML Pen Inject 20 Units into the skin daily. 15 mL 3  . Insulin Pen Needle 32G X 4 MM MISC Use daily 100 each 2  . Lancets (ACCU-CHEK MULTICLIX) lancets Use as instructed to test blood sugars 3 times daily E11.65 100 each 12  . metFORMIN (GLUCOPHAGE) 1000 MG tablet Take 1,000 mg by mouth 2 (two) times daily with a meal.      No current facility-administered medications on file prior to visit.    Allergies:   Allergies  Allergen Reactions  . Augmentin [Amoxicillin-Pot Clavulanate] Other (See Comments)    Altered mental  status Did it involve swelling of the face/tongue/throat, SOB, or low BP? Unknown Did it involve sudden or severe rash/hives, skin peeling, or any reaction on the inside of your mouth or nose? Unknown Did you need to seek medical attention at a hospital or doctor's office? Unknown  When did it last happen? unk If all above answers are "NO", may proceed with cephalosporin use.   Marland Kitchen Definity [Perflutren Lipid Microsphere] Other (See Comments)    Headache and facial flushing after Definity IVP given. No SOB or Chest pain,and Vital signs stable. Irven Baltimore, RN.  Marland Kitchen Novocain [Procaine] Other (See Comments)    Altered mental status  . Promethazine Swelling    Tongue swelling     Physical Exam  Vitals:   03/22/19 1036  BP: 132/84  Pulse: 65  Temp: 97.7 F (36.5 C)  Weight: 185 lb (83.9 kg)  Height: 5' 5.25" (1.657 m)   Body mass index is 30.55 kg/m. No exam data present  No flowsheet data found.   General: well developed, well nourished,  pleasant middle-age Caucasian female, seated, in no evident distress Head: head normocephalic and atraumatic.   Neck: supple with no carotid or supraclavicular bruits Cardiovascular: regular rate and rhythm, no murmurs Musculoskeletal: no deformity Skin:  no rash/petichiae Vascular:  Normal pulses all extremities   Neurologic Exam Mental Status: Awake and fully alert.   Normal speech and language.  Oriented to place and time. Recent and remote memory intact. Attention span, concentration and fund of knowledge appropriate. Mood and affect appropriate.  Cranial Nerves: Pupils equal, briskly reactive to light. Extraocular movements full without nystagmus. Visual fields full to confrontation. Hearing intact. Facial sensation intact. Face, tongue, palate moves normally and symmetrically.  Motor: Normal bulk and tone. Normal strength in all tested extremity muscles. Sensory.: intact to touch , pinprick , position and vibratory sensation.   Coordination: Rapid alternating movements normal in all extremities. Finger-to-nose and heel-to-shin performed accurately bilaterally. Gait and Station: Arises from chair without difficulty. Stance is normal. Gait demonstrates normal stride length and balance Reflexes: 1+ and symmetric. Toes downgoing.        Diagnostic Data (Labs, Imaging, Testing)  CT HEAD WO CONTRAST 07/13/2018 IMPRESSION: 1. Age indeterminate white matter small vessel disease type changes in the right centrum semiovale. 2. No superimposed acute cortically based infarct or acute intracranial hemorrhage identified. 3. ASPECTS is 10.  CT ANGIO HEAD W OR WO CONTRAST CT ANGIO NECK W OR WO CONTRAST 07/14/2018 IMPRESSION: 1. No intracranial hemorrhage or emergent large vessel occlusion. 2. Unchanged appearance of right frontal white matter lesion, perpendicular to the long axis of the lateral ventricles. While small vessel infarcts could have this appearance, the orientation of this lesion is concerning for demyelination. MRI of the brain with and without contrast is recommended to assess for multiple sclerosis. 3. Bilateral carotid bifurcation atherosclerosis with less than 50% stenosis.  MR BRAIN WO CONTRAST 07/14/2018 IMPRESSION: 1. No emergent finding. 2. Small remote lacunar infarcts in the deep cerebral white matter.  ECHOCARDIOGRAM 07/14/2018 IMPRESSIONS  1. The left ventricle has normal systolic function with an ejection fraction of 60-65%. The cavity size was normal. Left ventricular diastolic Doppler parameters are consistent with impaired relaxation. No evidence of left ventricular regional wall  motion abnormalities.  2. The right ventricle has normal systolic function. The cavity was normal. There is no increase in right ventricular wall thickness. Right ventricular systolic pressure could not be assessed.     ASSESSMENT: Jacqueline [name] is a 69 y.o. year old female here with right brain TIA  given risk factors versus complicated migraine on 4/0/9811 after presenting with 3 episodes of intermittent numbness/tingling in left arm and face x3 days followed by headache.  Return to ED the evening after discharge due to return  of symptoms with diagnosis of likely complicated migraine with Topamax initiated with benefit.  Vascular risk factors include HTN, HLD, uncontrolled DM, obesity and prior stroke on imaging.  Has been doing well since prior visit but does complain of chronic vertigo symptoms and appear to be related to BPPV.  She also has been experiencing 2-day history of 2 episodes of sharp chest pain    PLAN:  1. ?  Right brain TIA: Continue aspirin 81 mg daily  And atorvastatin for secondary stroke prevention. Maintain strict control of hypertension with blood pressure goal below 130/90, diabetes with hemoglobin A1c goal below 6.5% and cholesterol with LDL cholesterol (bad cholesterol) goal below 70 mg/dL.  I also advised the patient to eat a healthy diet with plenty of whole grains, cereals, fruits and vegetables, exercise regularly with at least 30 minutes of continuous activity daily and maintain ideal body weight. 2. Complicated migraine: As migraines have been stable, recommend trialing decreasing Topamax dosage to 25 mg a.m. and 50 mg p.m.  Advised to continue regimen for 2 weeks and then continue on 50 mg daily at bedtime after that.  Advised to notify office with any recurrent migraines decreasing dosage. 3. BPPV: referral placed to neuro rehab PT for further evaluation and tx 4. HTN: Advised to continue current treatment regimen.  Today's BP stable.  Advised to monitor at home along with continued follow-up with PCP for management 5. HLD: Advised to continue current treatment regimen along with continued follow-up with PCP for future prescribing and monitoring of lipid panel.  Lipid panel will be obtained to ensure improvement of LDL with increasing atorvastatin dosage -request  ongoing monitoring by PCP 6. DMII: Continue to follow with endocrinology for improvement of DM 7. Acute chest pain: Currently asymptomatic with normal vital signs.  Advised to call cardiology office for further evaluation but to call 911 if symptoms represent for further evaluation.  She verbalized understanding.    Follow up in 6 months or call earlier if needed   Greater than 50% of time during this 30 minute visit was spent on counseling, explanation of diagnosis of TIA versus complicated migraine, discussion regarding decreasing Topamax dosage as she has been stable, reviewing risk factor management of HTN, HLD and DM, planning of further management along with potential future management, discussion regarding possible BPPV and referral placed to PT for rehab, discussion regarding recent onset of chest pain and went to be seen in the ED, discussion regarding and answered all questions to patient satisfaction   Frann Rider, [facility]  [facility] [address] [place], [place] [address]  Phone [phone] Fax [phone] Note: This document was prepared with digital dictation and possible smart phrase technology. Any transcriptional errors that result from this process are unintentional.

## 2019-03-22 NOTE — Patient Instructions (Addendum)
Continue aspirin 81 mg daily  and atorvastatin for secondary stroke prevention  Continue to follow up with PCP regarding cholesterol and blood pressure management   Continue to follow with endocrinology for diabetes management and monitoring  Continue topamax but slowly decrease dose to 25 mg AM and 50 mg PM - continue this for 2 weeks and then try to stop AM dose. If headaches return, please resume prior dose   Referral placed for likely positional vertigo therapy - you will be called to schedule evaluation   Continue to monitor blood pressure at home  Maintain strict control of hypertension with blood pressure goal below 130/90, diabetes with hemoglobin A1c goal below 6.5% and cholesterol with LDL cholesterol (bad cholesterol) goal below 70 mg/dL. I also advised the patient to eat a healthy diet with plenty of whole grains, cereals, fruits and vegetables, exercise regularly and maintain ideal body weight.  Followup in the future with me in 6 months or call earlier if needed       Thank you for coming to see [place] at [facility]. I hope we have been able to provide you high quality care today.  You may receive a patient satisfaction survey over the next few weeks. We would appreciate your feedback and comments so that we may continue to improve ourselves and the health of our patients.     Benign Positional Vertigo Vertigo is the feeling that you or your surroundings are moving when they are not. Benign positional vertigo is the most common form of vertigo. This is usually a harmless condition (benign). This condition is positional. This means that symptoms are triggered by certain movements and positions. This condition can be dangerous if it occurs while you are doing something that could cause harm to you or others. This includes activities such as driving or operating machinery. What are the causes? In many cases, the cause of this condition is not known. It may  be caused by a disturbance in an area of the inner ear that helps your brain to sense movement and balance. This disturbance can be caused by:  Viral infection (labyrinthitis).  Head injury.  Repetitive motion, such as jumping, dancing, or running. What increases the risk? You are more likely to develop this condition if:  You are a woman.  You are 2 years of age or older. What are the signs or symptoms? Symptoms of this condition usually happen when you move your head or your eyes in different directions. Symptoms may start suddenly, and usually last for less than a minute. They include:  Loss of balance and falling.  Feeling like you are spinning or moving.  Feeling like your surroundings are spinning or moving.  Nausea and vomiting.  Blurred vision.  Dizziness.  Involuntary eye movement (nystagmus). Symptoms can be mild and cause only minor problems, or they can be severe and interfere with daily life. Episodes of benign positional vertigo may return (recur) over time. Symptoms may improve over time. How is this diagnosed? This condition may be diagnosed based on:  Your medical history.  Physical exam of the head, neck, and ears.  Tests, such as: ? MRI. ? CT scan. ? Eye movement tests. Your health care provider may ask you to change positions quickly while he or she watches you for symptoms of benign positional vertigo, such as nystagmus. Eye movement may be tested with a variety of exams that are designed to evaluate or stimulate vertigo. ? An electroencephalogram (EEG). This records  electrical activity in your brain. ? Hearing tests. You may be referred to a health care provider who specializes in ear, nose, and throat (ENT) problems (otolaryngologist) or a provider who specializes in disorders of the nervous system (neurologist). How is this treated?  This condition may be treated in a session in which your health care provider moves your head in specific  positions to adjust your inner ear back to normal. Treatment for this condition may take several sessions. Surgery may be needed in severe cases, but this is rare. In some cases, benign positional vertigo may resolve on its own in 2-4 weeks. Follow these instructions at home: Safety  Move slowly. Avoid sudden body or head movements or certain positions, as told by your health care provider.  Avoid driving until your health care provider says it is safe for you to do so.  Avoid operating heavy machinery until your health care provider says it is safe for you to do so.  Avoid doing any tasks that would be dangerous to you or others if vertigo occurs.  If you have trouble walking or keeping your balance, try using a cane for stability. If you feel dizzy or unstable, sit down right away.  Return to your normal activities as told by your health care provider. Ask your health care provider what activities are safe for you. General instructions  Take over-the-counter and prescription medicines only as told by your health care provider.  Drink enough fluid to keep your urine pale yellow.  Keep all follow-up visits as told by your health care provider. This is important. Contact a health care provider if:  You have a fever.  Your condition gets worse or you develop new symptoms.  Your family or friends notice any behavioral changes.  You have nausea or vomiting that gets worse.  You have numbness or a "pins and needles" sensation. Get help right away if you:  Have difficulty speaking or moving.  Are always dizzy.  Faint.  Develop severe headaches.  Have weakness in your legs or arms.  Have changes in your hearing or vision.  Develop a stiff neck.  Develop sensitivity to light. Summary  Vertigo is the feeling that you or your surroundings are moving when they are not. Benign positional vertigo is the most common form of vertigo.  The cause of this condition is not known.  It may be caused by a disturbance in an area of the inner ear that helps your brain to sense movement and balance.  Symptoms include loss of balance and falling, feeling that you or your surroundings are moving, nausea and vomiting, and blurred vision.  This condition can be diagnosed based on symptoms, physical exam, and other tests, such as MRI, CT scan, eye movement tests, and hearing tests.  Follow safety instructions as told by your health care provider. You will also be told when to contact your health care provider in case of problems. This information is not intended to replace advice given to you by your health care provider. Make sure you discuss any questions you have with your health care provider. Document Revised: 07/08/2017 Document Reviewed: 07/08/2017 Elsevier Patient Education  [place].

## 2019-03-22 NOTE — Progress Notes (Signed)
I agree with the above plan 

## 2019-04-08 ENCOUNTER — Other Ambulatory Visit: Payer: Self-pay | Admitting: Internal Medicine

## 2019-04-12 ENCOUNTER — Ambulatory Visit: Payer: Medicare HMO | Admitting: Physical Therapy

## 2019-04-12 ENCOUNTER — Other Ambulatory Visit: Payer: Self-pay

## 2019-04-12 DIAGNOSIS — H8112 Benign paroxysmal vertigo, left ear: Secondary | ICD-10-CM | POA: Insufficient documentation

## 2019-04-12 NOTE — Therapy (Signed)
[facility] [address] [place], [place], [address] Phone: [phone]   Fax:  [phone]  Patient Details  Name: Jacqueline [name] MRN: [id number] Date of Birth: 1950/04/15 Referring Provider:  Frann Rider, NP  Encounter Date: 04/12/2019  Pt arrived for vestibular eval for Lt BPPV;  Pt stated she was going to attend a funeral immediately after the PT eval.  Pt was informed that if treatment for Lt BPPV (Epley maneuver) was performed today that she may not feel well enough to attend this service; pt requested to reschedule PT evaluation - rescheduled til Thursday, 04-14-19 at pt's request.     Keyshawn Hellwig, [place], PT 04/12/2019, 12:58 PM  [facility] [address] [place], [place], [address] Phone: [phone]   Fax:  [phone]

## 2019-04-14 ENCOUNTER — Ambulatory Visit: Payer: Medicare HMO | Attending: Adult Health | Admitting: Physical Therapy

## 2019-04-14 ENCOUNTER — Other Ambulatory Visit: Payer: Self-pay

## 2019-04-14 DIAGNOSIS — H8112 Benign paroxysmal vertigo, left ear: Secondary | ICD-10-CM

## 2019-04-14 NOTE — Patient Instructions (Signed)
How to Perform the Epley Maneuver The Epley maneuver is an exercise that relieves symptoms of vertigo. Vertigo is the feeling that you or your surroundings are moving when they are not. When you feel vertigo, you may feel like the room is spinning and have trouble walking. Dizziness is a little different than vertigo. When you are dizzy, you may feel unsteady or light-headed. You can do this maneuver at home whenever you have symptoms of vertigo. You can do it up to 3 times a day until your symptoms go away. Even though the Epley maneuver may relieve your vertigo for a few weeks, it is possible that your symptoms will return. This maneuver relieves vertigo, but it does not relieve dizziness. What are the risks? If it is done correctly, the Epley maneuver is considered safe. Sometimes it can lead to dizziness or nausea that goes away after a short time. If you develop other symptoms, such as changes in vision, weakness, or numbness, stop doing the maneuver and call your health care provider. How to perform the Epley maneuver 1. Sit on the edge of a bed or table with your back straight and your legs extended or hanging over the edge of the bed or table. 2. Turn your head halfway toward the affected ear or side. 3. Lie backward quickly with your head turned until you are lying flat on your back. You may want to position a pillow under your shoulders. 4. Hold this position for 30 seconds. You may experience an attack of vertigo. This is normal. 5. Turn your head to the opposite direction until your unaffected ear is facing the floor. 6. Hold this position for 30 seconds. You may experience an attack of vertigo. This is normal. Hold this position until the vertigo stops. 7. Turn your whole body to the same side as your head. Hold for another 30 seconds. 8. Sit back up. You can repeat this exercise up to 3 times a day. Follow these instructions at home:  After doing the Epley maneuver, you can return  to your normal activities.  Ask your health care provider if there is anything you should do at home to prevent vertigo. He or she may recommend that you: ? Keep your head raised (elevated) with two or more pillows while you sleep. ? Do not sleep on the side of your affected ear. ? Get up slowly from bed. ? Avoid sudden movements during the day. ? Avoid extreme head movement, like looking up or bending over. Contact a health care provider if:  Your vertigo gets worse.  You have other symptoms, including: ? Nausea. ? Vomiting. ? Headache. Get help right away if:  You have vision changes.  You have a severe or worsening headache or neck pain.  You cannot stop vomiting.  You have new numbness or weakness in any part of your body. Summary  Vertigo is the feeling that you or your surroundings are moving when they are not.  The Epley maneuver is an exercise that relieves symptoms of vertigo.  If the Epley maneuver is done correctly, it is considered safe. You can do it up to 3 times a day. This information is not intended to replace advice given to you by your health care provider. Make sure you discuss any questions you have with your health care provider. Document Revised: 01/09/2017 Document Reviewed: 12/18/2015 Elsevier Patient Education  [place].     Benign Positional Vertigo Vertigo is the feeling that you or  your surroundings are moving when they are not. Benign positional vertigo is the most common form of vertigo. This is usually a harmless condition (benign). This condition is positional. This means that symptoms are triggered by certain movements and positions. This condition can be dangerous if it occurs while you are doing something that could cause harm to you or others. This includes activities such as driving or operating machinery. What are the causes? In many cases, the cause of this condition is not known. It may be caused by a disturbance in an area  of the inner ear that helps your brain to sense movement and balance. This disturbance can be caused by:  Viral infection (labyrinthitis).  Head injury.  Repetitive motion, such as jumping, dancing, or running. What increases the risk? You are more likely to develop this condition if:  You are a woman.  You are 6 years of age or older. What are the signs or symptoms? Symptoms of this condition usually happen when you move your head or your eyes in different directions. Symptoms may start suddenly, and usually last for less than a minute. They include:  Loss of balance and falling.  Feeling like you are spinning or moving.  Feeling like your surroundings are spinning or moving.  Nausea and vomiting.  Blurred vision.  Dizziness.  Involuntary eye movement (nystagmus). Symptoms can be mild and cause only minor problems, or they can be severe and interfere with daily life. Episodes of benign positional vertigo may return (recur) over time. Symptoms may improve over time. How is this diagnosed? This condition may be diagnosed based on:  Your medical history.  Physical exam of the head, neck, and ears.  Tests, such as: ? MRI. ? CT scan. ? Eye movement tests. Your health care provider may ask you to change positions quickly while he or she watches you for symptoms of benign positional vertigo, such as nystagmus. Eye movement may be tested with a variety of exams that are designed to evaluate or stimulate vertigo. ? An electroencephalogram (EEG). This records electrical activity in your brain. ? Hearing tests. You may be referred to a health care provider who specializes in ear, nose, and throat (ENT) problems (otolaryngologist) or a provider who specializes in disorders of the nervous system (neurologist). How is this treated?  This condition may be treated in a session in which your health care provider moves your head in specific positions to adjust your inner ear back to  normal. Treatment for this condition may take several sessions. Surgery may be needed in severe cases, but this is rare. In some cases, benign positional vertigo may resolve on its own in 2-4 weeks. Follow these instructions at home: Safety  Move slowly. Avoid sudden body or head movements or certain positions, as told by your health care provider.  Avoid driving until your health care provider says it is safe for you to do so.  Avoid operating heavy machinery until your health care provider says it is safe for you to do so.  Avoid doing any tasks that would be dangerous to you or others if vertigo occurs.  If you have trouble walking or keeping your balance, try using a cane for stability. If you feel dizzy or unstable, sit down right away.  Return to your normal activities as told by your health care provider. Ask your health care provider what activities are safe for you. General instructions  Take over-the-counter and prescription medicines only as told by your health  care provider.  Drink enough fluid to keep your urine pale yellow.  Keep all follow-up visits as told by your health care provider. This is important. Contact a health care provider if:  You have a fever.  Your condition gets worse or you develop new symptoms.  Your family or friends notice any behavioral changes.  You have nausea or vomiting that gets worse.  You have numbness or a "pins and needles" sensation. Get help right away if you:  Have difficulty speaking or moving.  Are always dizzy.  Faint.  Develop severe headaches.  Have weakness in your legs or arms.  Have changes in your hearing or vision.  Develop a stiff neck.  Develop sensitivity to light. Summary  Vertigo is the feeling that you or your surroundings are moving when they are not. Benign positional vertigo is the most common form of vertigo.  The cause of this condition is not known. It may be caused by a disturbance in an  area of the inner ear that helps your brain to sense movement and balance.  Symptoms include loss of balance and falling, feeling that you or your surroundings are moving, nausea and vomiting, and blurred vision.  This condition can be diagnosed based on symptoms, physical exam, and other tests, such as MRI, CT scan, eye movement tests, and hearing tests.  Follow safety instructions as told by your health care provider. You will also be told when to contact your health care provider in case of problems. This information is not intended to replace advice given to you by your health care provider. Make sure you discuss any questions you have with your health care provider. Document Revised: 07/08/2017 Document Reviewed: 07/08/2017 Elsevier Patient Education  [place] for Left Posterior / Anterior Canalithiasis    Sitting on bed: 1. Turn head 45 left. (a) Lie back slowly, shoulders on pillow, head on bed. (b) Hold _20-30___ seconds. 2. Keeping head on bed, turn head 90 right. Hold _20-30___ seconds. 3. Roll to right, head on 45 angle down toward bed. Hold _20-30___ seconds. 4. Sit up on right side of bed. Repeat _3___ times per session. Do _2___ sessions per day.  Copyright  VHI. All rights reserved.

## 2019-04-15 NOTE — Therapy (Signed)
[facility] [address] [place], [place], [address] Phone: [phone]   Fax:  [phone]  Physical Therapy Evaluation  Patient Details  Name: ALYSSUM RESTER MRN: [id number] Date of Birth: [birth year] Referring Provider (PT): Frann Rider, NP   Encounter Date: 04/14/2019  PT End of Session - 04/15/19 [id number]    Visit Number  1    Number of Visits  1    Authorization Type  [employer]    Authorization Time Period  04-14-19 - 05-15-19    PT Start Time  0930    PT Stop Time  0957    PT Time Calculation (min)  27 min    Activity Tolerance  Patient tolerated treatment well    Behavior During Therapy  [facility] for tasks assessed/performed       Past Medical History:  Diagnosis Date  . Diabetes mellitus   . HLD (hyperlipidemia)   . Hypertension   . Nephrolithiasis   . Obese     Past Surgical History:  Procedure Laterality Date  . ROTATOR CUFF REPAIR  2009  . TUBAL LIGATION  1979    There were no vitals filed for this visit.   Subjective Assessment - 04/14/19 1004    Subjective  Pt reports she has had episodic vertigo for past 16 yrs - occurs when she turns over onto her left side and also with bending over and returning to upright position    Pertinent History  HTN, DM type 2, h/oTIA on 07-13-18; h/o migraines    Limitations  Other (comment)   bed mobility   Patient Stated Goals  resolve the vertigo    Currently in Pain?  No/denies         [facility] PT Assessment - 04/15/19 0001      Assessment   Medical Diagnosis  BPPV    Referring Provider (PT)  Frann Rider, NP    Onset Date/Surgical Date  03/22/19   pt reports BPPV initially started 16 years ago    Prior Therapy  none      Precautions   Precautions  None      Restrictions   Weight Bearing Restrictions  No      Balance Screen   Has the patient fallen in the past 6 months  No    Has the patient had a decrease in activity level because of a fear of  falling?   No    Is the patient reluctant to leave their home because of a fear of falling?   No      Prior Function   Level of Independence  Independent           Vestibular Assessment - 04/15/19 0001      Vestibular Assessment   General Observation  Pt is a 69 yr old lady with c/o episodic vertigo which she states initially started 16 yrs ago - has occurred intermittently since this time      Symptom Behavior   Type of Dizziness   Spinning    Frequency of Dizziness  varies - depends on the movement    Duration of Dizziness  secs to minutes     Symptom Nature  Positional    Aggravating Factors  Rolling to left;Forward bending    Relieving Factors  Lying supine;Head stationary    Progression of Symptoms  No change since onset      Positional Testing   Dix-Hallpike  Dix-Hallpike Right;Dix-Hallpike Left    Sidelying  Test  Sidelying Right;Sidelying Left      Dix-Hallpike Right   Dix-Hallpike Right Duration  none    Dix-Hallpike Right Symptoms  No nystagmus      Dix-Hallpike Left   Dix-Hallpike Left Duration  approx. 15 secs    Dix-Hallpike Left Symptoms  Upbeat, left rotatory nystagmus      Sidelying Right   Sidelying Right Duration  none    Sidelying Right Symptoms  No nystagmus      Sidelying Left   Sidelying Left Duration  c/o dizziness    Sidelying Left Symptoms  No nystagmus          Objective measurements completed on examination: See above findings.    Pt was treated with Epley maneuver for Lt BPPV - 3 reps performed with resolution of symptoms on 3rd rep of Epley maneuver       Pt to call for follow up appt prn - should BPPV re-occur in the future (No additional appt needed at this time due to resolution of BPPV)        PT Education - 04/15/19 1229    Education Details  pt instructed in Epley maneuver for Lt BPPV; pt was given info on etiology of BPPV    Person(s) Educated  Patient    Methods  Explanation;Demonstration;Handout     Comprehension  Verbalized understanding;Returned demonstration                  Plan - 04/15/19 1243    Clinical Impression Statement  Pt has (+) Lt Dix-Hallpike test with upbeating, Lt rotary nystagmus indicative of Lt posterior canalithiasis.  Symptoms appear to have resolved on 3rd rep of Epley manuever with no nystagmus and no c/o vertigo in any position.    Personal Factors and Comorbidities  Comorbidity 1;Time since onset of injury/illness/exacerbation    Comorbidities  h/o TIA:  DM type 2    Examination-Activity Limitations  Transfers;Bed Mobility    Examination-Participation Restrictions  Cleaning;Driving;Meal Prep;Interpersonal Relationship;Laundry;Community Activity    Stability/Clinical Decision Making  Stable/Uncomplicated    Clinical Decision Making  Low    Rehab Potential  Good    PT Frequency  One time visit    PT Treatment/Interventions  Vestibular;Patient/family education;ADLs/Self Care Home Management;Canalith Repostioning    PT Next Visit Plan  N/A - Lt BPPV resolved with treatment during initial eval - pt to call for follow up prn    Consulted and Agree with Plan of Care  Patient       Patient will benefit from skilled therapeutic intervention in order to improve the following deficits and impairments:  Dizziness, Difficulty walking, Decreased balance  Visit Diagnosis: BPPV (benign paroxysmal positional vertigo), left - Plan: PT plan of care cert/re-cert     Problem List Patient Active Problem List   Diagnosis Date Noted  . Hematuria 12/29/2018  . Migraine without status migrainosus, not intractable 12/29/2018  . Trigeminal neuralgia 12/29/2018  . TIA (transient ischemic attack) 07/13/2018  . Left-sided weakness 07/13/2018  . Morbid obesity ([place]) 11/25/2013  . HLD (hyperlipidemia) 11/25/2013  . DM type 2 (diabetes mellitus, type 2) ([place]) 11/25/2013  . Chest pain 11/04/2012    Alda Lea, PT 04/15/2019, 1:10 PM  [facility] [address] [place], [place], [address] Phone: [phone]   Fax:  [phone]  Name: ASAIAH MILHOUS MRN: [id number] Date of Birth: 10/15/1950

## 2019-04-21 ENCOUNTER — Ambulatory Visit: Payer: Medicare HMO | Admitting: Physical Therapy

## 2019-04-21 ENCOUNTER — Ambulatory Visit: Payer: Medicare HMO

## 2019-05-03 ENCOUNTER — Encounter: Payer: Self-pay | Admitting: Internal Medicine

## 2019-05-03 ENCOUNTER — Ambulatory Visit: Payer: Medicare HMO | Admitting: Internal Medicine

## 2019-05-03 ENCOUNTER — Other Ambulatory Visit: Payer: Self-pay

## 2019-05-03 VITALS — BP 122/78 | HR 68 | Temp 98.6°F | Ht 65.0 in | Wt 187.4 lb

## 2019-05-03 DIAGNOSIS — E1159 Type 2 diabetes mellitus with other circulatory complications: Secondary | ICD-10-CM | POA: Diagnosis not present

## 2019-05-03 LAB — POCT GLYCOSYLATED HEMOGLOBIN (HGB A1C): Hemoglobin A1C: 6.8 % — AB (ref 4.0–5.6)

## 2019-05-03 LAB — GLUCOSE, POCT (MANUAL RESULT ENTRY): POC Glucose: 178 mg/dl — AB (ref 70–99)

## 2019-05-03 MED ORDER — INSULIN DETEMIR 100 UNIT/ML FLEXPEN: 20.0000 [IU] | PEN_INJECTOR | Freq: Every day | SUBCUTANEOUS | 11 refills | Status: DC

## 2019-05-03 MED ORDER — METFORMIN HCL 1000 MG PO TABS: 1000.0000 mg | ORAL_TABLET | Freq: Two times a day (BID) | ORAL | 3 refills | Status: DC

## 2019-05-03 NOTE — Patient Instructions (Signed)
-   Continue  Levemir  20 units daily  - Continue Metformin Twice a day    - Check sugar before breakfast and Bedtime     HOW TO TREAT LOW BLOOD SUGARS (Blood sugar LESS THAN 70 MG/DL)  Please follow the RULE OF 15 for the treatment of hypoglycemia treatment (when your (blood sugars are less than 70 mg/dL)    STEP 1: Take 15 grams of carbohydrates when your blood sugar is low, which includes:   3-4 GLUCOSE TABS  OR  3-4 OZ OF JUICE OR REGULAR SODA OR  ONE TUBE OF GLUCOSE GEL     STEP 2: RECHECK blood sugar in 15 MINUTES STEP 3: If your blood sugar is still low at the 15 minute recheck --> then, go back to STEP 1 and treat AGAIN with another 15 grams of carbohydrates.

## 2019-05-03 NOTE — Progress Notes (Signed)
Name: Jacqueline [name]  Age/ Sex: 69 y.o., female   MRN/ DOB: [id number], 03-01-1950     PCP: Jacqueline Alar, NP   Reason for Endocrinology Evaluation: Type 2 Diabetes Mellitus  Initial Endocrine Consultative Visit: 10/04/2018    PATIENT IDENTIFIER: [name] is a 69 y.o. female with a past medical history of HTN, DM, HLD and migraine headaches. The patient has followed with Endocrinology clinic since 10/04/2018 for consultative assistance with management of her diabetes.  DIABETIC HISTORY:  Jacqueline [name] was diagnosed with DM many years ago, she has been on metformin and glipizide for years, insulin started 07/2018. She was prescribed Victoza at some point but she did not start it as her sister had a bad experience with it. Her hemoglobin A1c has ranged from 7.0's % , peaking at 11.2 %in 2020.  On her initial visit to our clinic, her A1c was 11.2%. She was on Levemir and metformin . We added [place] and stopped Glipizide. Due to hematuria she stopped Jardiance   SUBJECTIVE:   During the last visit (01/03/2019): A1c 6.6 %.  We continued Levemir Metformin  Today (05/03/2019): Jacqueline [name] is here for a follow up on diabetes.  She checks her blood sugars 1 times daily, preprandial to breakfast. The patient has not had hypoglycemic episodes since the last clinic visit. Otherwise, the patient has not required any recent emergency interventions for hypoglycemia and has not had recent hospitalizations secondary to hyper or hypoglycemic episodes.       ROS: As per HPI and as detailed below: Review of Systems  Respiratory: Negative for cough and shortness of breath.   Cardiovascular: Negative for chest pain and palpitations.  Gastrointestinal: Negative for diarrhea and nausea.  Genitourinary: Negative for frequency and urgency.  Endo/Heme/Allergies: Negative for polydipsia.      HOME DIABETES REGIMEN:  Levemir 20 units daily  Metformin 1000 mg Twice a day     METER DOWNLOAD  SUMMARY: Did not bring    HISTORY:  Past Medical History:  Past Medical History:  Diagnosis Date  . Diabetes mellitus   . HLD (hyperlipidemia)   . Hypertension   . Nephrolithiasis   . Obese    Past Surgical History:  Past Surgical History:  Procedure Laterality Date  . ROTATOR CUFF REPAIR  2009  . TUBAL LIGATION  1979    Social History:  reports that she has never smoked. She has never used smokeless tobacco. She reports that she does not drink alcohol or use drugs. Family History:  Family History  Problem Relation Age of Onset  . Diabetes Father   . Heart attack Father 26  . Heart disease Mother        hx cabg at 17  . Diabetes type II Brother   . Parkinson's disease Sister   . Brain cancer Sister   . Pancreatic cancer Brother   . Diabetes Mellitus II Sister   . Diabetes Mellitus II Sister   . CAD Sister   . Diabetes Mellitus II Sister   . Diabetes Mellitus II Sister   . Osteoarthritis Daughter   . AAA (abdominal aortic aneurysm) Daughter      HOME MEDICATIONS: Allergies as of 05/03/2019      Reactions   Augmentin [amoxicillin-pot Clavulanate] Other (See Comments)   Altered mental status Did it involve swelling of the face/tongue/throat, SOB, or low BP? Unknown Did it involve sudden or severe rash/hives, skin peeling, or any reaction on the inside of your mouth  or nose? Unknown Did you need to seek medical attention at a hospital or doctor's office? Unknown When did it last happen? unk If all above answers are "NO", may proceed with cephalosporin use.   Definity [perflutren Lipid Microsphere] Other (See Comments)   Headache and facial flushing after Definity IVP given. No SOB or Chest pain,and Vital signs stable. Irven Baltimore, RN.   Novocain [procaine] Other (See Comments)   Altered mental status   Promethazine Swelling   Tongue swelling      Medication List       Accurate as of May 03, 2019  9:16 AM. If you have any questions, ask your nurse or  doctor.        Accu-Chek Guide Me w/Device Kit 1 kit by Does not apply route daily.   Accu-Chek Guide test strip Generic drug: glucose blood Use as instructed to test blood sugars 3 times daily E11.65   accu-chek multiclix lancets Use as instructed to test blood sugars 3 times daily E11.65   aspirin 81 MG EC tablet Take 1 tablet (81 mg total) by mouth daily.   atorvastatin 80 MG tablet Commonly known as: LIPITOR Take 1 tablet (80 mg total) by mouth daily at 6 PM.   insulin detemir 100 UNIT/ML FlexPen Commonly known as: LEVEMIR Inject 20 Units into the skin daily.   Insulin Pen Needle 32G X 4 MM Misc Use daily   Jardiance 10 MG Tabs tablet Generic drug: empagliflozin TAKE 1 TABLET BY MOUTH EVERY DAY BEFORE BREAKFAST   metFORMIN 1000 MG tablet Commonly known as: GLUCOPHAGE Take 1,000 mg by mouth 2 (two) times daily with a meal.   topiramate 50 MG tablet Commonly known as: TOPAMAX Take 0.5 tablets (25 mg total) by mouth daily AND 1 tablet (50 mg total) at bedtime.        OBJECTIVE:   Vital Signs: BP 122/78 (BP Location: Right Arm, Patient Position: Sitting, Cuff Size: Large)   Pulse 68   Temp 98.6 F (37 C)   Ht 5' 5"  (1.651 m)   Wt 187 lb 6.4 oz (85 kg)   SpO2 98%   BMI 31.18 kg/m   Wt Readings from Last 3 Encounters:  05/03/19 187 lb 6.4 oz (85 kg)  03/22/19 185 lb (83.9 kg)  01/03/19 187 lb (84.8 kg)     Exam: General: Pt appears well and is in NAD  Lungs: Clear with good BS bilat with no rales, rhonchi, or wheezes  Heart: RRR with normal S1 and S2 and no gallops; no murmurs; no rub  Extremities: No pretibial edema.  Skin: Normal texture and temperature to palpation.   Neuro: MS is good with appropriate affect, pt is alert and Ox3       DM foot exam: 05/03/2019  The skin of the feet is intact without sores or ulcerations. The pedal pulses are 2+ on right and 2+ on left. The sensation is intact to a screening 5.07, 10 gram monofilament  bilaterally    DATA REVIEWED:  Lab Results  Component Value Date   HGBA1C 6.6 (A) 01/03/2019   HGBA1C 11.2 (H) 07/13/2018   Lab Results  Component Value Date   LDLCALC 49 11/16/2018   CREATININE 0.83 12/22/2018    Lab Results  Component Value Date   CHOL 107 11/16/2018   HDL 36 (L) 11/16/2018   LDLCALC 49 11/16/2018   TRIG 122 11/16/2018   CHOLHDL 3.0 11/16/2018         ASSESSMENT / PLAN /  RECOMMENDATIONS:   1) Type 2 Diabetes Mellitus, Optimally  controlled, With Macrovascular  complications - Most recent A1c of 6.8 %. Goal A1c < 7.0 %.    -A1c still at goal, no evidence of hypoglycemia per patient. -She was encouraged to bring meter on next visit  - She is being evaluated for hematuria, CT scan in 12/2018 showed evidence of left renal nephrolithiasis, so I have asked her to hold off on the Jardiance, due to the increased risk of UTI .  -Patient is wondering where to go for an eye exam, I have encouraged her to look up ophthalmologist in [place] area where she lives and to contact the practice to make sure they accept her insurance.    MEDICATIONS:  Continue Levemir 20 units daily   Continue Metformin 1000 mg BID   EDUCATION / INSTRUCTIONS:  BG monitoring instructions: Patient is instructed to check her blood sugars 2 times a day, fasting and bedtime .  Call [facility] if: BG persistently < 70 or > 300. . I reviewed the Rule of 15 for the treatment of hypoglycemia in detail with the patient. Literature supplied.     F/U in 6 months   Signed electronically by: Mack Guise, MD  [facility] Endocrinology  [facility] [address] [place], [place] [address] Phone: [phone] FAX: [phone]   CC: Jacqueline [name], [place] [address] [place] [address] Phone: [phone]  Fax: [phone]  Return to Endocrinology clinic as below: Future Appointments  Date Time Provider  [place]  09/27/2019 10:45 AM Frann Rider, NP [facility] None

## 2019-08-04 ENCOUNTER — Encounter: Payer: Self-pay | Admitting: Family

## 2019-08-04 DIAGNOSIS — E119 Type 2 diabetes mellitus without complications: Secondary | ICD-10-CM

## 2019-09-05 LAB — HM DIABETES EYE EXAM

## 2019-09-26 ENCOUNTER — Encounter: Payer: Self-pay | Admitting: Adult Health

## 2019-09-27 ENCOUNTER — Ambulatory Visit: Payer: Medicare HMO | Admitting: Adult Health

## 2019-10-18 ENCOUNTER — Other Ambulatory Visit: Payer: Self-pay

## 2019-10-18 ENCOUNTER — Ambulatory Visit: Payer: Medicare HMO | Admitting: Adult Health

## 2019-10-18 ENCOUNTER — Encounter: Payer: Self-pay | Admitting: Adult Health

## 2019-10-18 VITALS — BP 140/74 | HR 70 | Ht 65.0 in | Wt 191.0 lb

## 2019-10-18 DIAGNOSIS — G43109 Migraine with aura, not intractable, without status migrainosus: Secondary | ICD-10-CM

## 2019-10-18 DIAGNOSIS — E785 Hyperlipidemia, unspecified: Secondary | ICD-10-CM

## 2019-10-18 DIAGNOSIS — H8110 Benign paroxysmal vertigo, unspecified ear: Secondary | ICD-10-CM

## 2019-10-18 DIAGNOSIS — G459 Transient cerebral ischemic attack, unspecified: Secondary | ICD-10-CM | POA: Diagnosis not present

## 2019-10-18 DIAGNOSIS — I10 Essential (primary) hypertension: Secondary | ICD-10-CM

## 2019-10-18 DIAGNOSIS — E1159 Type 2 diabetes mellitus with other circulatory complications: Secondary | ICD-10-CM

## 2019-10-18 MED ORDER — TOPIRAMATE 50 MG PO TABS: 50.0000 mg | ORAL_TABLET | Freq: Every day | ORAL | 3 refills | Status: DC

## 2019-10-18 NOTE — Progress Notes (Signed)
I agree with the above plan 

## 2019-10-18 NOTE — Progress Notes (Signed)
[facility] [address] [place] [address] [phone]       STROKE FOLLOW UP NOTE  [name] Date of Birth:  09-28-1950 Medical Record Number:  [id number]   Reason for visit: f/u TIA versus complicated migraine 7/32/2025    CHIEF COMPLAINT:  Chief Complaint  Patient presents with  . Follow-up    6 month f/u. States she has been doing well since last visit. Does notice a sharp pain above her right eye.   . treatment room    alone     HPI:  Today, 10/18/2019, Jacqueline [name] returns for follow-up regarding TIA versus complicated migraine  Currently on Topamax 50 mg nightly for migraine prevention tolerating dosage well without side effects She has not experienced any reoccurring migraine or complicated migraine symptoms or TIA symptoms  She does report over the past month, she has been experiencing R upper orbital sharp "ping" type pain lasting for only a few seconds and then subsiding occurring 3-4 times per week.  She denies any visual changes associated.  Does plan on undergoing cataract procedure on the right eye scheduled on 9/27 with symptoms of blurred vision and difficulty seeing at night.  She does report history of allergies and at times can have sinus pressure/congestion.  She is not currently on allergy medication.  Previously referred to vestibular rehab for BPPV with improvement after Epley maneuver done by PT and did have recent episode of vertigo which subsided after performing Epley maneuver at home.  She has not had any reoccurring symptoms.  Remains on aspirin 81 mg daily and atorvastatin 80 mg daily without side effects Blood pressure today 140/74 Glucose levels stable per patient currently managed by endocrinology  She is concerned regarding weight gain but recently started participating in Silver sneakers program.  No further concerns at this time       History provided for reference purposes only Update 03/22/2019: [name] is a 69 year old female who is being seen today for stroke follow-up.  She has been doing well from a stroke standpoint since prior visit.  She does endorse chronic history of vertigo appearing to present as BPPV with symptoms occurring with quick head movement or when lying down and changing positions.  She has not previously seen physical therapy for this concern.  No reoccurring headaches with ongoing use of Topamax 50 mg twice daily denies side effects.  Continues on aspirin and atorvastatin for secondary stroke prevention of side effects.  Blood pressure today stable.  Denies new or worsening stroke/TIA symptoms. Of note, patient does states she has been attempting to schedule appointment with PCP but due to illness with patient reporting cold-like symptoms she has been unable to be seen.  Recent COVID-19 testing negative.  Over the past couple days, she has been experiencing sharp chest pain intermittently in the midsternum radiating towards the back and into her left arm.  This only lasts for a short duration and is worsened with increased exertion.  She denies any shortness of breath, dizziness or lightheadedness.  She has previously been seen by cardiology but has not been seen recently.  Update 11/16/2018: Jacqueline [name] is being seen today for follow-up.  Continues on aspirin without bleeding or bruising.  At prior visit, lipid panel obtained with LDL 126 (prior 119).  Recommended to increase atorvastatin dose from 40 mg to 80 mg daily.  She continues on atorvastatin 80 mg daily without myalgias.  Lipid panels have not been repeated since  that time.  Blood pressure today 130/74.  Established care with endocrinology with adjustments made to medication regimen. Improvement of glucose levels per patient report.  She is also working on weight loss with prior office visit weight 208lbs and today's weight 193lb.  She has been working hard on improving diet and exercise.  Continues on Topamax 50 mg twice  daily. Has not had any additional migraine with left arm numbness.  Will have occasional headaches which she believes is related to tension/stress but resolves without intervention.  No further concerns at this time.  Initial visit 09/01/2018: Since discharge, she has experienced 2 additional headaches which were preceded by left arm numbness/tingling.  Symptoms similar to prior episodes.  She has continued on Topamax 50 mg nightly with only minimal daytime fatigue but did not limit her overall functioning.   She does endorse approximately 2 weeks prior, she woke up in the morning with tingling on her nose and swelling of top lip.  She took Benadryl throughout the day and resolved within 24 hours.  Symptoms consistent with allergic reaction to unknown modality as she did not change medications at that time, denies eating a different type of food or use of lip product.  Completed 3 weeks DAPT continues on aspirin alone without bleeding or bruising Previously taking atorvastatin 40 mg daily without side effects myalgias but has since run out of prescription Blood pressure 147/77 -does not routinely monitor at home Glucose levels uncontrolled and is in the process of finding new PCP versus establishing care with endocrinology due to continued difficulty with diabetic management No further concerns at this time  Hospital admission 07/13/2018: Jacqueline [name] is an 69 y.o. Caucasian female with PMH of hypertension, hyperlipidemia, obesity and diabetes who presented to [facility] on 07/14/2018 with 3 episode of left arm and face numbness tingling for the last 3 days.  Initial episode lasting only 3 minutes and then resolved but followed by headache with bifrontal throbbing was lasted approximately 3 minutes.  She attributed this to empty stomach.  Second episode occurred the next day lasting approximately 3 minutes and then accompanied by headache lasting approximately 3 minutes which occurred prior to  lunch.  Third episode occurred while eating lunch with similar symptoms plus feeling heaviness of left arm which lasted about 5 minutes followed by mild brief headache.  Denies any prior history of migraine or headaches.  She does endorse having visual auras with flashing lights in the past.  Stroke work-up unremarkable and likely right brain TIA given risk factors versus complicated migraine due to headache following each episode.  Stroke work-up completed once transferred to [facility] ED.  MRI no acute stroke but did show small deep white matter infarcts.  Vessel imaging and 2D echo unremarkable.  LDL 119 and A1c 11.2.  Recommended DAPT for 3 weeks and aspirin alone.  HTN stable.  Initiated atorvastatin 40 mg daily for HLD management.  Recommended close PCP follow-up for uncontrolled DM.  Other stroke risk factors include advanced age, obesity and one episode of prior vision loss lasting 1 to 2 seconds approximately 3 to 4 years ago.  As all symptoms resolved without residual deficit, discharged home in stable condition without therapy needs. She was discharged in the afternoon on 07/14/2018 but returned to [facility] ED that night due to recurrence of numbness in left shoulder and headache.  CT head unremarkable.  Was consulted by neurology and felt her symptoms were likely related to complex migraine as extensive  stroke work-up unremarkable.  Discharged on Topamax and recommend follow-up with neurology outpatient.     ROS:   14 system review of systems performed and negative with exception of those listed in HPI PMH:  Past Medical History:  Diagnosis Date  . Diabetes mellitus   . HLD (hyperlipidemia)   . Hypertension   . Nephrolithiasis   . Obese     PSH:  Past Surgical History:  Procedure Laterality Date  . ROTATOR CUFF REPAIR  2009  . TUBAL LIGATION  1979    Social History:  Social History   Socioeconomic History  . Marital status: Married    Spouse name: Not on file  . Number of children: 4   . Years of education: Not on file  . Highest education level: Not on file  Occupational History  . Occupation: Retired-Missionary  Tobacco Use  . Smoking status: Never Smoker  . Smokeless tobacco: Never Used  Vaping Use  . Vaping Use: Never used  Substance and Sexual Activity  . Alcohol use: No  . Drug use: No  . Sexual activity: Yes    Partners: Male    Birth control/protection: Post-menopausal  Other Topics Concern  . Not on file  Social History Narrative   Retired Media planner   Husband is a Theme park manager- does some missions in [place]   2 great grandchildren (live in [place])   Married 32 years   4 children- [place], 1  [place], 1 in [place], 1 at home   8 granchildren   Enjoys crafts   No pets   Social Determinants of [place] Strain: [place]   . Difficulty of Paying Living Expenses: Not hard at all  Food Insecurity: No Food Insecurity  . Worried About Charity fundraiser in the Last Year: Never true  . Ran Out of Food in the Last Year: Never true  Transportation Needs: No Transportation Needs  . Lack of Transportation (Medical): No  . Lack of Transportation (Non-Medical): No  Physical Activity: Insufficiently Active  . Days of Exercise per Week: 2 days  . Minutes of Exercise per Session: 30 min  Stress:   . Feeling of Stress : Not on file  Social Connections:   . Frequency of Communication with Friends and Family: Not on file  . Frequency of Social Gatherings with Friends and Family: Not on file  . Attends Religious Services: Not on file  . Active Member of Clubs or Organizations: Not on file  . Attends Archivist Meetings: Not on file  . Marital Status: Not on file  Intimate Partner Violence:   . Fear of Current or Ex-Partner: Not on file  . Emotionally Abused: Not on file  . Physically Abused: Not on file  . Sexually Abused: Not on file    Family History:  Family History  Problem Relation Age of Onset  .  Diabetes Father   . Heart attack Father 22  . Heart disease Mother        hx cabg at 61  . Diabetes type II Brother   . Parkinson's disease Sister   . Brain cancer Sister   . Pancreatic cancer Brother   . Diabetes Mellitus II Sister   . Diabetes Mellitus II Sister   . CAD Sister   . Diabetes Mellitus II Sister   . Diabetes Mellitus II Sister   . Osteoarthritis Daughter   . AAA (abdominal aortic aneurysm) Daughter     Medications:  Current Outpatient Medications on File Prior to Visit  Medication Sig Dispense Refill  . aspirin EC 81 MG EC tablet Take 1 tablet (81 mg total) by mouth daily. 21 tablet 0  . atorvastatin (LIPITOR) 80 MG tablet Take 1 tablet (80 mg total) by mouth daily at 6 PM. 30 tablet 3  . Blood Glucose Monitoring Suppl (ACCU-CHEK GUIDE ME) w/Device KIT 1 kit by Does not apply route daily. 1 kit 0  . glucose blood (ACCU-CHEK GUIDE) test strip Use as instructed to test blood sugars 3 times daily E11.65 100 each 12  . insulin detemir (LEVEMIR) 100 UNIT/ML FlexPen Inject 20 Units into the skin daily. 15 mL 11  . Insulin Pen Needle 32G X 4 MM MISC Use daily 100 each 2  . Lancets (ACCU-CHEK MULTICLIX) lancets Use as instructed to test blood sugars 3 times daily E11.65 100 each 12  . metFORMIN (GLUCOPHAGE) 1000 MG tablet Take 1 tablet (1,000 mg total) by mouth 2 (two) times daily with a meal. 180 tablet 3   No current facility-administered medications on file prior to visit.    Allergies:   Allergies  Allergen Reactions  . Augmentin [Amoxicillin-Pot Clavulanate] Other (See Comments)    Altered mental status Did it involve swelling of the face/tongue/throat, SOB, or low BP? Unknown Did it involve sudden or severe rash/hives, skin peeling, or any reaction on the inside of your mouth or nose? Unknown Did you need to seek medical attention at a hospital or doctor's office? Unknown When did it last happen? unk If all above answers are "NO", may proceed with  cephalosporin use.   Marland Kitchen Definity [Perflutren Lipid Microsphere] Other (See Comments)    Headache and facial flushing after Definity IVP given. No SOB or Chest pain,and Vital signs stable. Irven Baltimore, RN.  Marland Kitchen Novocain [Procaine] Other (See Comments)    Altered mental status  . Promethazine Swelling    Tongue swelling     Physical Exam  Vitals:   10/18/19 1418  BP: 140/74  Pulse: 70  Weight: 191 lb (86.6 kg)  Height: _0  (1.651 m)   Body mass index is 31.78 kg/m. No exam data present  General: well developed, well nourished,  pleasant middle-age Caucasian female, seated, in no evident distress Head: head normocephalic and atraumatic.  Tenderness with pressure over R frontal sinus and L maxillary sinus Neck: supple with no carotid or supraclavicular bruits Cardiovascular: regular rate and rhythm, no murmurs Musculoskeletal: no deformity Skin:  no rash/petichiae Vascular:  Normal pulses all extremities   Neurologic Exam Mental Status: Awake and fully alert.  Fluent speech and language.  Oriented to place and time. Recent and remote memory intact. Attention span, concentration and fund of knowledge appropriate. Mood and affect appropriate.  Cranial Nerves: Pupils equal, briskly reactive to light. Extraocular movements full with 2-3 beat nystagmus bilaterally with horizontal eye movements. Visual fields full to confrontation. Hearing intact. Facial sensation intact. Face, tongue, palate moves normally and symmetrically.  Motor: Normal bulk and tone. Normal strength in all tested extremity muscles. Sensory.: intact to touch , pinprick , position and vibratory sensation.  Coordination: Rapid alternating movements normal in all extremities. Finger-to-nose and heel-to-shin performed accurately bilaterally. Gait and Station: Arises from chair without difficulty. Stance is normal. Gait demonstrates normal stride length and balance Reflexes: 1+ and symmetric. Toes downgoing.         ASSESSMENT: Jacqueline [name] is a 69 y.o. year old female here with right brain TIA given risk factors  versus complicated migraine on 02/17/15 after presenting with 3 episodes of intermittent numbness/tingling in left arm and face x3 days followed by headache.  Return to ED the evening after discharge due to return of symptoms with diagnosis of likely complicated migraine with Topamax initiated with benefit.  Vascular risk factors include HTN, HLD, uncontrolled DM, chronic BPPV, obesity and prior stroke on imaging.     PLAN:  1. ?  Right brain TIA:  a. Continue aspirin 81 mg daily  And atorvastatin for secondary stroke prevention.   b. Ensure close PCP follow-up for aggressive stroke risk factor management. 2. Complicated migraine:  a. Continue Topamax 50 mg nightly for migraine prophylaxis -refill provided 3. Intermittent R orbital pain: a. ddx sinus pain vs eyestrain in setting of cataract (plans on undergoing cataract surgery R eye 9/27) vs tension related headache vs migraine variant b. Will continue to monitor at this time.  No indication for further evaluation with lack of associated symptoms and lack of danger signs such as visual changes, temporal pain, fever, associated neurological deficit, nonpositional, and no associated trauma c. Advised to discuss further with PCP regarding use of allergy medication for suspicion of sinus related pain especially with tenderness in the right frontal sinus and left maxillary sinus d. Continue Topamax 50 mg nightly for possible tension headache vs migraine variant 4. Intermittent BPPV:  a. Chronic history over the past 16 years.   b. Received Epley maneuver on 04/14/2019 by PT with resolution of symptoms.   c. Continue with Epley maneuvers at home with acute onset and advised to call office if additional vestibular rehab is indicated 5. HTN:  a. BP goal<130/90.  Stable.   b. Not currently on medication management.   c. Managed by  PCP 6. HLD:  a. LDL goal<70.  LDL 49 (11/16/2018).   b. Remains on atorvastatin 80 mg daily.   c. Managed by PCP. 7. DMII:  a. A1c goal<7.  A1c 6.8 (05/03/2019).   b. Remains on Metformin and Levemir.   c. Managed by endocrinology     Follow up in 6 months or call earlier if needed    I spent 30 minutes of face-to-face and non-face-to-face time with patient.  This included previsit chart review, lab review, study review, order entry, electronic health record documentation, patient education and discussion regarding complicated migraine vs TIA, R orbital pain and possible etiologies, intermittent BPPV, importance of managing stroke risk factors and answered all questions to patient satisfaction   Frann Rider, AGNP-BC  [facility] [address] [place], [place] [address]  Phone [phone] Fax [phone] Note: This document was prepared with digital dictation and possible smart phrase technology. Any transcriptional errors that result from this process are unintentional.

## 2019-10-18 NOTE — Patient Instructions (Signed)
Your Plan:  Continue Topamax 50 mg nightly for migraine management  Right above eye pain possibly due to sinus issues or visual impairment secondary to known cataracts.  Please discuss further with your PCP or eye doctor regarding recommended allergy medication treatment such as use of fluticasone nasal spray or oral form medications  Continue aspirin and atorvastatin for secondary stroke prevention Continue to follow closely with your PCP for blood pressure and cholesterol management Continue to follow closely with endocrinology for diabetic management   Follow-up in 6 months or call earlier if needed     Thank you for coming to see [place] at [facility]. I hope we have been able to provide you high quality care today.  You may receive a patient satisfaction survey over the next few weeks. We would appreciate your feedback and comments so that we may continue to improve ourselves and the health of our patients.

## 2019-11-03 ENCOUNTER — Other Ambulatory Visit: Payer: Self-pay

## 2019-11-03 ENCOUNTER — Encounter: Payer: Self-pay | Admitting: Internal Medicine

## 2019-11-03 ENCOUNTER — Ambulatory Visit: Payer: Medicare HMO | Admitting: Internal Medicine

## 2019-11-03 VITALS — BP 132/78 | HR 75 | Ht 65.0 in | Wt 197.4 lb

## 2019-11-03 DIAGNOSIS — E1159 Type 2 diabetes mellitus with other circulatory complications: Secondary | ICD-10-CM | POA: Diagnosis not present

## 2019-11-03 LAB — POCT GLUCOSE (DEVICE FOR HOME USE): Glucose Fasting, POC: 182 mg/dL — AB (ref 70–99)

## 2019-11-03 LAB — POCT GLYCOSYLATED HEMOGLOBIN (HGB A1C): Hemoglobin A1C: 6.9 % — AB (ref 4.0–5.6)

## 2019-11-03 MED ORDER — RYBELSUS 3 MG PO TABS: 3.0000 mg | ORAL_TABLET | Freq: Every day | ORAL | 6 refills | Status: DC

## 2019-11-03 NOTE — Progress Notes (Signed)
Name: Jacqueline [name]  Age/ Sex: 69 y.o., female   MRN/ DOB: [id number], 03-05-1950     PCP: [name], NP   Reason for Endocrinology Evaluation: Type 2 Diabetes Mellitus  Initial Endocrine Consultative Visit: 10/04/2018    PATIENT IDENTIFIER: Jacqueline [name] is a 69 y.o. female with a past medical history of HTN, DM, HLD and migraine headaches. The patient has followed with Endocrinology clinic since 10/04/2018 for consultative assistance with management of her diabetes.  DIABETIC HISTORY:  Jacqueline [name] was diagnosed with DM many years ago, she has been on metformin and glipizide for years, insulin started 07/2018. She was prescribed Victoza at some point but she did not start it as her sister had a bad experience with it. Her hemoglobin A1c has ranged from 7.0's % , peaking at 11.2 %in 2020.    On her initial visit to our clinic, her A1c was 11.2%. She was on Levemir and metformin . We added [place] and stopped Glipizide. Due to hematuria she stopped Jardiance   SUBJECTIVE: stress   During the last visit (05/03/2019): A1c 6.8 %.  We continued Levemir Metformin  Today (11/03/2019): Jacqueline [name] is here for a follow up on diabetes.  She checks her blood sugars 1 times daily, preprandial to breakfast. The patient has not had hypoglycemic episodes since the last clinic visit.    C/o difficulty losing weight, has been stress eating  Started silver sneaker    HOME DIABETES REGIMEN:  Levemir 20 units daily  Metformin 1000 mg Twice a day     METER DOWNLOAD SUMMARY: Did not bring       HISTORY:  Past Medical History:  Past Medical History:  Diagnosis Date  . Diabetes mellitus   . HLD (hyperlipidemia)   . Hypertension   . Nephrolithiasis   . Obese    Past Surgical History:  Past Surgical History:  Procedure Laterality Date  . ROTATOR CUFF REPAIR  2009  . TUBAL LIGATION  1979    Social History:  reports that she has never smoked. She has never used smokeless  tobacco. She reports that she does not drink alcohol and does not use drugs. Family History:  Family History  Problem Relation Age of Onset  . Diabetes Father   . Heart attack Father 43  . Heart disease Mother        hx cabg at 84  . Diabetes type II Brother   . Parkinson's disease Sister   . Brain cancer Sister   . Pancreatic cancer Brother   . Diabetes Mellitus II Sister   . Diabetes Mellitus II Sister   . CAD Sister   . Diabetes Mellitus II Sister   . Diabetes Mellitus II Sister   . Osteoarthritis Daughter   . AAA (abdominal aortic aneurysm) Daughter      HOME MEDICATIONS: Allergies as of 11/03/2019      Reactions   Augmentin [amoxicillin-pot Clavulanate] Other (See Comments)   Altered mental status Did it involve swelling of the face/tongue/throat, SOB, or low BP? Unknown Did it involve sudden or severe rash/hives, skin peeling, or any reaction on the inside of your mouth or nose? Unknown Did you need to seek medical attention at a hospital or doctor's office? Unknown When did it last happen? unk If all above answers are "NO", may proceed with cephalosporin use.   Definity [perflutren Lipid Microsphere] Other (See Comments)   Headache and facial flushing after Definity IVP given. No SOB or Chest  pain,and Vital signs stable. Irven Baltimore, RN.   Novocain [procaine] Other (See Comments)   Altered mental status   Promethazine Swelling   Tongue swelling      Medication List       Accurate as of November 03, 2019 11:23 AM. If you have any questions, ask your nurse or doctor.        Accu-Chek Guide Me w/Device Kit 1 kit by Does not apply route daily.   Accu-Chek Guide test strip Generic drug: glucose blood Use as instructed to test blood sugars 3 times daily E11.65   accu-chek multiclix lancets Use as instructed to test blood sugars 3 times daily E11.65   aspirin 81 MG EC tablet Take 1 tablet (81 mg total) by mouth daily.   atorvastatin 80 MG  tablet Commonly known as: LIPITOR Take 1 tablet (80 mg total) by mouth daily at 6 PM.   insulin detemir 100 UNIT/ML FlexPen Commonly known as: LEVEMIR Inject 20 Units into the skin daily.   Insulin Pen Needle 32G X 4 MM Misc Use daily   metFORMIN 1000 MG tablet Commonly known as: GLUCOPHAGE Take 1 tablet (1,000 mg total) by mouth 2 (two) times daily with a meal.   Rybelsus 3 MG Tabs Generic drug: Semaglutide Take 3 mg by mouth daily. Started by: Dorita Sciara, MD   topiramate 50 MG tablet Commonly known as: TOPAMAX Take 1 tablet (50 mg total) by mouth at bedtime.        OBJECTIVE:   Vital Signs: BP 132/78 (BP Location: Left Arm, Patient Position: Sitting, Cuff Size: Normal)   Pulse 75   Ht 5' 5"  (1.651 m)   Wt 197 lb 6.4 oz (89.5 kg)   SpO2 94%   BMI 32.85 kg/m   Wt Readings from Last 3 Encounters:  11/03/19 197 lb 6.4 oz (89.5 kg)  10/18/19 191 lb (86.6 kg)  05/03/19 187 lb 6.4 oz (85 kg)     Exam: General: Pt appears well and is in NAD  Lungs: Clear with good BS bilat with no rales, rhonchi, or wheezes  Heart: RRR with normal S1 and S2 and no gallops; no murmurs; no rub  Extremities: No pretibial edema.  Skin: Normal texture and temperature to palpation.   Neuro: MS is good with appropriate affect, pt is alert and Ox3       DM foot exam: 05/03/2019  The skin of the feet is intact without sores or ulcerations. The pedal pulses are 2+ on right and 2+ on left. The sensation is intact to a screening 5.07, 10 gram monofilament bilaterally    DATA REVIEWED:  Lab Results  Component Value Date   HGBA1C 6.9 (A) 11/03/2019   HGBA1C 6.8 (A) 05/03/2019   HGBA1C 6.6 (A) 01/03/2019   Lab Results  Component Value Date   LDLCALC 49 11/16/2018   CREATININE 0.83 12/22/2018    Lab Results  Component Value Date   CHOL 107 11/16/2018   HDL 36 (L) 11/16/2018   LDLCALC 49 11/16/2018   TRIG 122 11/16/2018   CHOLHDL 3.0 11/16/2018          ASSESSMENT / PLAN / RECOMMENDATIONS:   1) Type 2 Diabetes Mellitus, Optimally  controlled, With Macrovascular  complications - Most recent A1c of 6.9 %. Goal A1c < 7.0 %.    -Her A1c continues to be at goal, but has been gradually trending upward. -In the past she has declined Victoza because her sister had " bad" experience with  it, patient has no personal history of pancreatitis.  I have encouraged her to try a GLP-1 agonist, as it will help with weight loss, patient is complaining of her inability to lose weight today. -I have cautioned her against the GI side effects of Rybelsus - She is being evaluated for hematuria, CT scan in 12/2018 showed evidence of left renal nephrolithiasis, so I have asked her to hold off on the Jardiance, due to the increased risk of UTI .    MEDICATIONS:  Continue Levemir 20 units daily   Continue Metformin 1000 mg BID   Start Rybelsus 3 mg daily with breakfast   EDUCATION / INSTRUCTIONS:  BG monitoring instructions: Patient is instructed to check her blood sugars 2 times a day, fasting and bedtime .  Call [facility] if: BG persistently < 70  . I reviewed the Rule of 15 for the treatment of hypoglycemia in detail with the patient. Literature supplied.     F/U in 4 months   Signed electronically by: Mack Guise, MD  [facility] Endocrinology  [facility] [address] [place], [place] [address] Phone: [phone] FAX: [phone]   CC: [name], [address] [place] [address] Phone: [phone]  Fax: [phone]  Return to Endocrinology clinic as below: Future Appointments  Date Time Provider [place]  03/05/2020 10:10 AM Razia Screws, Melanie Crazier, MD [facility] None  04/16/2020  9:15 AM Frann Rider, NP [facility] None

## 2019-11-03 NOTE — Patient Instructions (Signed)
-   Start Rybelsus 3 mg daily with Breakfast  - Continue Metformin 1000 mg Twice a day  - Continue Levemir 20 units daily        HOW TO TREAT LOW BLOOD SUGARS (Blood sugar LESS THAN 70 MG/DL)  Please follow the RULE OF 15 for the treatment of hypoglycemia treatment (when your (blood sugars are less than 70 mg/dL)    STEP 1: Take 15 grams of carbohydrates when your blood sugar is low, which includes:   3-4 GLUCOSE TABS  OR  3-4 OZ OF JUICE OR REGULAR SODA OR  ONE TUBE OF GLUCOSE GEL     STEP 2: RECHECK blood sugar in 15 MINUTES STEP 3: If your blood sugar is still low at the 15 minute recheck --> then, go back to STEP 1 and treat AGAIN with another 15 grams of carbohydrates.  

## 2019-11-15 ENCOUNTER — Telehealth: Payer: Self-pay | Admitting: Internal Medicine

## 2019-11-15 NOTE — Telephone Encounter (Signed)
Patient has questions about new RX for Reybelsus - return call to [phone] or [phone]

## 2019-11-17 NOTE — Telephone Encounter (Signed)
New message    [facility] insurance calling asking is patient benefit from a statin medication reduce the risk of cardiac event per [facility] & AHA guidelines   Please discuss with patient

## 2020-01-14 ENCOUNTER — Other Ambulatory Visit: Payer: Self-pay | Admitting: Internal Medicine

## 2020-03-01 ENCOUNTER — Other Ambulatory Visit: Payer: Self-pay

## 2020-03-05 ENCOUNTER — Telehealth: Payer: Self-pay | Admitting: Adult Health

## 2020-03-05 ENCOUNTER — Ambulatory Visit: Payer: Medicare HMO | Admitting: Internal Medicine

## 2020-03-05 ENCOUNTER — Encounter: Payer: Self-pay | Admitting: Family

## 2020-03-05 NOTE — Telephone Encounter (Signed)
Pt. states she got her results this morning & she has covid. She is asking for some medication for her headache, drainage & cough. Please advise.

## 2020-03-05 NOTE — Progress Notes (Deleted)
Name: Jacqueline [name]  Age/ Sex: 70 y.o., female   MRN/ DOB: [id number], 04/10/1950     PCP: [name], NP   Reason for Endocrinology Evaluation: Type 2 Diabetes Mellitus  Initial Endocrine Consultative Visit: 10/04/2018    PATIENT IDENTIFIER: [name] is a 70 y.o. female with a past medical history of HTN, DM, HLD and migraine headaches. The patient has followed with Endocrinology clinic since 10/04/2018 for consultative assistance with management of her diabetes.  DIABETIC HISTORY:  [name] was diagnosed with DM many years ago, she has been on metformin and glipizide for years, insulin started 07/2018. She was prescribed Victoza at some point but she did not start it as her sister had a bad experience with it. Her hemoglobin A1c has ranged from 7.0's % , peaking at 11.2 %in 2020.    On her initial visit to our clinic, her A1c was 11.2%. She was on Levemir and metformin . We added [place] and stopped Glipizide. Due to hematuria she stopped Jardiance    Rybelsus started 10/2019  SUBJECTIVE: stress   During the last visit (11/03/2019): A1c 6.9 %.  We continued Levemir Metformin and started Rybelsus      Today (03/05/2020): [name] is here for a follow up on diabetes.  She checks her blood sugars 1 times daily, preprandial to breakfast. The patient has not had hypoglycemic episodes since the last clinic visit.    C/o difficulty losing weight, has been stress eating  Started silver sneaker    HOME DIABETES REGIMEN:  Levemir 20 units daily  Metformin 1000 mg Twice a day  Rybelsus 3 mg daily     METER DOWNLOAD SUMMARY: Did not bring       HISTORY:  Past Medical History:  Past Medical History:  Diagnosis Date  . Diabetes mellitus   . HLD (hyperlipidemia)   . Hypertension   . Nephrolithiasis   . Obese    Past Surgical History:  Past Surgical History:  Procedure Laterality Date  . ROTATOR CUFF REPAIR  2009  . TUBAL LIGATION  1979    Social  History:  reports that she has never smoked. She has never used smokeless tobacco. She reports that she does not drink alcohol and does not use drugs. Family History:  Family History  Problem Relation Age of Onset  . Diabetes Father   . Heart attack Father 67  . Heart disease Mother        hx cabg at 29  . Diabetes type II Brother   . Parkinson's disease Sister   . Brain cancer Sister   . Pancreatic cancer Brother   . Diabetes Mellitus II Sister   . Diabetes Mellitus II Sister   . CAD Sister   . Diabetes Mellitus II Sister   . Diabetes Mellitus II Sister   . Osteoarthritis Daughter   . AAA (abdominal aortic aneurysm) Daughter      HOME MEDICATIONS: Allergies as of 03/05/2020      Reactions   Augmentin [amoxicillin-pot Clavulanate] Other (See Comments)   Altered mental status Did it involve swelling of the face/tongue/throat, SOB, or low BP? Unknown Did it involve sudden or severe rash/hives, skin peeling, or any reaction on the inside of your mouth or nose? Unknown Did you need to seek medical attention at a hospital or doctor's office? Unknown When did it last happen? unk If all above answers are "NO", may proceed with cephalosporin use.   Definity [perflutren Lipid Microsphere]  Other (See Comments)   Headache and facial flushing after Definity IVP given. No SOB or Chest pain,and Vital signs stable. Irven Baltimore, RN.   Novocain [procaine] Other (See Comments)   Altered mental status   Promethazine Swelling   Tongue swelling      Medication List       Accurate as of March 05, 2020  7:20 AM. If you have any questions, ask your nurse or doctor.        Accu-Chek Guide Me w/Device Kit 1 kit by Does not apply route daily.   Accu-Chek Guide test strip Generic drug: glucose blood Use as instructed to test blood sugars 3 times daily E11.65   accu-chek multiclix lancets Use as instructed to test blood sugars 3 times daily E11.65   aspirin 81 MG EC tablet Take  1 tablet (81 mg total) by mouth daily.   atorvastatin 80 MG tablet Commonly known as: LIPITOR Take 1 tablet (80 mg total) by mouth daily at 6 PM.   BD Pen Needle Nano 2nd Gen 32G X 4 MM Misc Generic drug: Insulin Pen Needle USE DAILY AS DIRECTED   insulin detemir 100 UNIT/ML FlexPen Commonly known as: LEVEMIR Inject 20 Units into the skin daily.   metFORMIN 1000 MG tablet Commonly known as: GLUCOPHAGE Take 1 tablet (1,000 mg total) by mouth 2 (two) times daily with a meal.   Rybelsus 3 MG Tabs Generic drug: Semaglutide Take 3 mg by mouth daily.   topiramate 50 MG tablet Commonly known as: TOPAMAX Take 1 tablet (50 mg total) by mouth at bedtime.        OBJECTIVE:   Vital Signs: There were no vitals taken for this visit.  Wt Readings from Last 3 Encounters:  11/03/19 197 lb 6.4 oz (89.5 kg)  10/18/19 191 lb (86.6 kg)  05/03/19 187 lb 6.4 oz (85 kg)     Exam: General: Pt appears well and is in NAD  Lungs: Clear with good BS bilat with no rales, rhonchi, or wheezes  Heart: RRR with normal S1 and S2 and no gallops; no murmurs; no rub  Extremities: No pretibial edema.  Skin: Normal texture and temperature to palpation.   Neuro: MS is good with appropriate affect, pt is alert and Ox3       DM foot exam: 05/03/2019  The skin of the feet is intact without sores or ulcerations. The pedal pulses are 2+ on right and 2+ on left. The sensation is intact to a screening 5.07, 10 gram monofilament bilaterally    DATA REVIEWED:  Lab Results  Component Value Date   HGBA1C 6.9 (A) 11/03/2019   HGBA1C 6.8 (A) 05/03/2019   HGBA1C 6.6 (A) 01/03/2019   Lab Results  Component Value Date   LDLCALC 49 11/16/2018   CREATININE 0.83 12/22/2018    Lab Results  Component Value Date   CHOL 107 11/16/2018   HDL 36 (L) 11/16/2018   LDLCALC 49 11/16/2018   TRIG 122 11/16/2018   CHOLHDL 3.0 11/16/2018         ASSESSMENT / PLAN / RECOMMENDATIONS:   1) Type 2 Diabetes  Mellitus, Optimally  controlled, With Macrovascular  complications - Most recent A1c of 6.9 %. Goal A1c < 7.0 %.    -Her A1c continues to be at goal, but has been gradually trending upward. -In the past she has declined Victoza because her sister had " bad" experience with it, patient has no personal history of pancreatitis.  I have encouraged  her to try a GLP-1 agonist, as it will help with weight loss, patient is complaining of her inability to lose weight today. -I have cautioned her against the GI side effects of Rybelsus - She is being evaluated for hematuria, CT scan in 12/2018 showed evidence of left renal nephrolithiasis, so I have asked her to hold off on the Jardiance, due to the increased risk of UTI .    MEDICATIONS:  Continue Levemir 20 units daily   Continue Metformin 1000 mg BID   Start Rybelsus 3 mg daily with breakfast   EDUCATION / INSTRUCTIONS:  BG monitoring instructions: Patient is instructed to check her blood sugars 2 times a day, fasting and bedtime .  Call [facility] [facility] if: BG persistently < 70  . I reviewed the Rule of 15 for the treatment of hypoglycemia in detail with the patient. Literature supplied.     F/U in 4 months   Signed electronically by: Mack Guise, MD  [facility] Endocrinology  [facility] [address] [place], [place] [address] Phone: [phone] FAX: [phone]   CC: [name], [address] [place] [address] Phone: [phone]  Fax: [phone]  Return to Endocrinology clinic as below: Future Appointments  Date Time Provider [place]  03/05/2020 10:10 AM Shamleffer, Melanie Crazier, MD [facility] None  04/16/2020  9:15 AM Frann Rider, NP [facility] None

## 2020-03-05 NOTE — Telephone Encounter (Signed)
I called pt and she has tested positive for COVID.  I relayed that she needs to contact her pcp to let them know but also for any medication that she requires re: to covid.  She appreciated call back.

## 2020-03-08 NOTE — Telephone Encounter (Signed)
Patient will be added to the schedule tomorrow at 2 pm for virtual visit

## 2020-03-09 ENCOUNTER — Other Ambulatory Visit: Payer: Self-pay

## 2020-03-09 ENCOUNTER — Telehealth (INDEPENDENT_AMBULATORY_CARE_PROVIDER_SITE_OTHER): Payer: Medicare HMO | Admitting: Family

## 2020-03-09 DIAGNOSIS — U071 COVID-19: Secondary | ICD-10-CM | POA: Diagnosis not present

## 2020-03-09 NOTE — Progress Notes (Signed)
Virtual Visit via Video Note  I connected with Jacqueline [name] on 03/09/20 at  2:00 PM EST by a video enabled telemedicine application and verified that I am speaking with the correct person using two identifiers.  Location: Patient: home Provider: work   I discussed the limitations of evaluation and management by telemedicine and the availability of in person appointments. The patient expressed understanding and agreed to proceed. The patient, her husband and myself were present for today's video call.   History of Present Illness:  Patient reports that symptoms began Saturday 03/04/19.  Husband got sick a few days before.  Symptoms began as nasal congestion and headache,sore throat.  Had nausea/vomitting over the weekend which has resolved. States that she is now tolerating PO's.  Had low grade temp 100.3 over the weekend. She does report cough but this seems to be improving.  She reports some mild SOB "when I try to do stuff."  She reports her most recent oxygen level was 93-94 % on room air.  Has had 2 Moderna shots, no booster. She is taking excedrin migraine for her headache and mucinex as needed for congestion.    Observations/Objective:   Gen: Awake, alert, no acute distress Resp: Breathing is even and non-labored Psych: calm/pleasant demeanor Neuro: Alert and Oriented x 3, + facial symmetry, speech is clear.   Assessment and Plan:  COVID-19 infection- Advised of CDC guidelines for self isolation/ ending isolation.  Advised of safe practice guidelines. Symptom Tier reviewed.  Encouraged to monitor for any worsening symptoms; watch for increased shortness of breath, weakness, and signs of dehydration. Advised when to seek emergency care.  Instructed to rest and hydrate well.  Advised to leave the house during recommended isolation period, only if it is necessary to seek medical care. She is outside the window for monoclonal antibody infusion.   Follow Up Instructions:    I  discussed the assessment and treatment plan with the patient. The patient was provided an opportunity to ask questions and all were answered. The patient agreed with the plan and demonstrated an understanding of the instructions.   The patient was advised to call back or seek an in-person evaluation if the symptoms worsen or if the condition fails to improve as anticipated.  Nance Pear, NP

## 2020-03-29 ENCOUNTER — Other Ambulatory Visit: Payer: Self-pay

## 2020-04-02 ENCOUNTER — Encounter: Payer: Self-pay | Admitting: Internal Medicine

## 2020-04-02 ENCOUNTER — Ambulatory Visit: Payer: Medicare HMO | Admitting: Internal Medicine

## 2020-04-02 ENCOUNTER — Other Ambulatory Visit: Payer: Self-pay

## 2020-04-02 VITALS — BP 136/82 | HR 80 | Ht 65.0 in | Wt 198.4 lb

## 2020-04-02 DIAGNOSIS — E1159 Type 2 diabetes mellitus with other circulatory complications: Secondary | ICD-10-CM

## 2020-04-02 LAB — BASIC METABOLIC PANEL
BUN: 20 mg/dL (ref 6–23)
CO2: 27 mEq/L (ref 19–32)
Calcium: 9.6 mg/dL (ref 8.4–10.5)
Chloride: 106 mEq/L (ref 96–112)
Creatinine, Ser: 0.83 mg/dL (ref 0.40–1.20)
GFR: 71.2 mL/min (ref >60.00)
Glucose, Bld: 180 mg/dL — ABNORMAL HIGH (ref 70–99)
Potassium: 5 mEq/L (ref 3.5–5.1)
Sodium: 141 mEq/L (ref 135–145)

## 2020-04-02 LAB — LIPID PANEL
Cholesterol: 185 mg/dL (ref 0–200)
HDL: 41.6 mg/dL (ref >39.00)
LDL Cholesterol: 107 mg/dL — ABNORMAL HIGH (ref 0–99)
NonHDL: 143.8
Total CHOL/HDL Ratio: 4
Triglycerides: 182 mg/dL — ABNORMAL HIGH (ref 0.0–149.0)
VLDL: 36.4 mg/dL (ref 0.0–40.0)

## 2020-04-02 LAB — POCT GLYCOSYLATED HEMOGLOBIN (HGB A1C): Hemoglobin A1C: 7.2 % — AB (ref 4.0–5.6)

## 2020-04-02 LAB — MICROALBUMIN / CREATININE URINE RATIO
Creatinine,U: 69.3 mg/dL
Microalb Creat Ratio: 2 mg/g (ref 0.0–30.0)
Microalb, Ur: 1.4 mg/dL (ref 0.0–1.9)

## 2020-04-02 LAB — POCT GLUCOSE (DEVICE FOR HOME USE): POC Glucose: 182 mg/dl — AB (ref 70–99)

## 2020-04-02 MED ORDER — INSULIN DETEMIR 100 UNIT/ML FLEXPEN: 20.0000 [IU] | PEN_INJECTOR | Freq: Every day | SUBCUTANEOUS | 3 refills | Status: DC

## 2020-04-02 MED ORDER — METFORMIN HCL 1000 MG PO TABS: 1000.0000 mg | ORAL_TABLET | Freq: Two times a day (BID) | ORAL | 3 refills | Status: DC

## 2020-04-02 NOTE — Patient Instructions (Signed)
-   Start Rybelsus 3 mg daily with Breakfast  - Continue Metformin 1000 mg Twice a day  - Continue Levemir 20 units daily        HOW TO TREAT LOW BLOOD SUGARS (Blood sugar LESS THAN 70 MG/DL)  Please follow the RULE OF 15 for the treatment of hypoglycemia treatment (when your (blood sugars are less than 70 mg/dL)    STEP 1: Take 15 grams of carbohydrates when your blood sugar is low, which includes:   3-4 GLUCOSE TABS  OR  3-4 OZ OF JUICE OR REGULAR SODA OR  ONE TUBE OF GLUCOSE GEL     STEP 2: RECHECK blood sugar in 15 MINUTES STEP 3: If your blood sugar is still low at the 15 minute recheck --> then, go back to STEP 1 and treat AGAIN with another 15 grams of carbohydrates.

## 2020-04-02 NOTE — Progress Notes (Signed)
Name: Jacqueline [name]  Age/ Sex: 70 y.o., female   MRN/ DOB: [id number], [birth year]     PCP: [name], NP   Reason for Endocrinology Evaluation: Type 2 Diabetes Mellitus  Initial Endocrine Consultative Visit: 10/04/2018    PATIENT IDENTIFIER: [name] is a 70 y.o. female with a past medical history of HTN, DM, HLD and migraine headaches. The patient has followed with Endocrinology clinic since 10/04/2018 for consultative assistance with management of her diabetes.  DIABETIC HISTORY:  Jacqueline [name] was diagnosed with DM many years ago, she has been on metformin and glipizide for years, insulin started 07/2018. She was prescribed Victoza at some point but she did not start it as her sister had a bad experience with it. Her hemoglobin A1c has ranged from 7.0's % , peaking at 11.2 %in 2020.    On her initial visit to our clinic, her A1c was 11.2%. She was on Levemir and metformin . We added [place] and stopped Glipizide. Due to hematuria she stopped Jardiance    Rybelsus started 10/2019  SUBJECTIVE: stress   During the last visit (11/03/2019): A1c 6.9 %.  We continued Levemir Metformin and started Rybelsus      Today (04/02/2020): Jacqueline [name] is here for a follow up on diabetes.  She checks her blood sugars occasionally.The patient has not had hypoglycemic episodes since the last clinic visit.   She never started the Rybelsus , was going to have eye surgery and did not want to start a new prescription , she has had COVID infection and has been busy with multiple       HOME DIABETES REGIMEN:  Levemir 20 units daily  Metformin 1000 mg Twice a day  Rybelsus 3 mg daily - did not take it     METER DOWNLOAD SUMMARY: Did not bring    DIABETIC COMPLICATIONS: Microvascular complications:   Denies: retinopathy, neuropathy, CKD  Last eye exam: Completed 2021     Macrovascular complications:   TIA ( 0/0459)  Denies: CAD, PVD    HISTORY:  Past Medical  History:  Past Medical History:  Diagnosis Date  . Diabetes mellitus   . HLD (hyperlipidemia)   . Hypertension   . Nephrolithiasis   . Obese    Past Surgical History:  Past Surgical History:  Procedure Laterality Date  . ROTATOR CUFF REPAIR  2009  . TUBAL LIGATION  1979    Social History:  reports that she has never smoked. She has never used smokeless tobacco. She reports that she does not drink alcohol and does not use drugs. Family History:  Family History  Problem Relation Age of Onset  . Diabetes Father   . Heart attack Father 54  . Heart disease Mother        hx cabg at 56  . Diabetes type II Brother   . Parkinson's disease Sister   . Brain cancer Sister   . Pancreatic cancer Brother   . Diabetes Mellitus II Sister   . Diabetes Mellitus II Sister   . CAD Sister   . Diabetes Mellitus II Sister   . Diabetes Mellitus II Sister   . Osteoarthritis Daughter   . AAA (abdominal aortic aneurysm) Daughter      HOME MEDICATIONS: Allergies as of 04/02/2020      Reactions   Augmentin [amoxicillin-pot Clavulanate] Other (See Comments)   Altered mental status Did it involve swelling of the face/tongue/throat, SOB, or low BP? Unknown Did it involve sudden or  severe rash/hives, skin peeling, or any reaction on the inside of your mouth or nose? Unknown Did you need to seek medical attention at a hospital or doctor's office? Unknown When did it last happen? unk If all above answers are "NO", may proceed with cephalosporin use.   Definity [perflutren Lipid Microsphere] Other (See Comments)   Headache and facial flushing after Definity IVP given. No SOB or Chest pain,and Vital signs stable. Irven Baltimore, RN.   Novocain [procaine] Other (See Comments)   Altered mental status   Promethazine Swelling   Tongue swelling      Medication List       Accurate as of April 02, 2020 12:17 PM. If you have any questions, ask your nurse or doctor.        Accu-Chek Guide Me  w/Device Kit 1 kit by Does not apply route daily.   Accu-Chek Guide test strip Generic drug: glucose blood Use as instructed to test blood sugars 3 times daily E11.65   accu-chek multiclix lancets Use as instructed to test blood sugars 3 times daily E11.65   aspirin 81 MG EC tablet Take 1 tablet (81 mg total) by mouth daily.   atorvastatin 80 MG tablet Commonly known as: LIPITOR Take 1 tablet (80 mg total) by mouth daily at 6 PM.   BD Pen Needle Nano 2nd Gen 32G X 4 MM Misc Generic drug: Insulin Pen Needle USE DAILY AS DIRECTED   insulin detemir 100 UNIT/ML FlexPen Commonly known as: LEVEMIR Inject 20 Units into the skin daily.   metFORMIN 1000 MG tablet Commonly known as: GLUCOPHAGE Take 1 tablet (1,000 mg total) by mouth 2 (two) times daily with a meal.   Rybelsus 3 MG Tabs Generic drug: Semaglutide Take 3 mg by mouth daily.   topiramate 50 MG tablet Commonly known as: TOPAMAX Take 1 tablet (50 mg total) by mouth at bedtime.        OBJECTIVE:   Vital Signs: BP 136/82   Pulse 80   Ht 5' 5"  (1.651 m)   Wt 198 lb 6 oz (90 kg)   SpO2 98%   BMI 33.01 kg/m   Wt Readings from Last 3 Encounters:  04/02/20 198 lb 6 oz (90 kg)  11/03/19 197 lb 6.4 oz (89.5 kg)  10/18/19 191 lb (86.6 kg)     Exam: General: Pt appears well and is in NAD  Lungs: Clear with good BS bilat with no rales, rhonchi, or wheezes  Heart: RRR with normal S1 and S2 and no gallops; no murmurs; no rub  Extremities: No pretibial edema.  Skin: Normal texture and temperature to palpation.   Neuro: MS is good with appropriate affect, pt is alert and Ox3       DM foot exam: 05/03/2019  The skin of the feet is intact without sores or ulcerations. The pedal pulses are 2+ on right and 2+ on left. The sensation is intact to a screening 5.07, 10 gram monofilament bilaterally    DATA REVIEWED:  Lab Results  Component Value Date   HGBA1C 7.2 (A) 04/02/2020   HGBA1C 6.9 (A) 11/03/2019    HGBA1C 6.8 (A) 05/03/2019   Lab Results  Component Value Date   LDLCALC 49 11/16/2018   CREATININE 0.83 12/22/2018    Lab Results  Component Value Date   CHOL 107 11/16/2018   HDL 36 (L) 11/16/2018   LDLCALC 49 11/16/2018   TRIG 122 11/16/2018   CHOLHDL 3.0 11/16/2018  ASSESSMENT / PLAN / RECOMMENDATIONS:   1) Type 2 Diabetes Mellitus, Sub-Optimally  controlled, With Macrovascular  complications - Most recent A1c of 7.2 %. Goal A1c < 7.0 %.    -Her A1c slightly elevated -I have encouraged her to start the Rybelsus  - Based on CT scan in 12/2018 showed evidence of left renal nephrolithiasis, so I have asked her to hold off on the Jardiance, due to the increased risk of UTI , will reconsider if the renal stone resolves    MEDICATIONS:  Continue Levemir 20 units daily   Continue Metformin 1000 mg BID   Start Rybelsus 3 mg daily with breakfast   EDUCATION / INSTRUCTIONS:  BG monitoring instructions: Patient is instructed to check her blood sugars 2 times a day, fasting and bedtime .  Call [facility] if: BG persistently < 70  . I reviewed the Rule of 15 for the treatment of hypoglycemia in detail with the patient. Literature supplied.     F/U in 4 months   Signed electronically by: Mack Guise, MD  [facility] Endocrinology  [facility] [address] [place], [place] [address] Phone: [phone] FAX: [phone]   CC: [name], [address] [place] [address] Phone: [phone]  Fax: [phone]  Return to Endocrinology clinic as below: Future Appointments  Date Time Provider [place]  04/16/2020  9:15 AM Frann Rider, NP [facility] None  08/01/2020 10:10 AM Maksymilian Mabey, Melanie Crazier, MD [facility] None

## 2020-04-04 ENCOUNTER — Other Ambulatory Visit: Payer: Self-pay | Admitting: Adult Health

## 2020-04-16 ENCOUNTER — Ambulatory Visit: Payer: Medicare HMO | Admitting: Adult Health

## 2020-04-16 ENCOUNTER — Encounter: Payer: Self-pay | Admitting: Adult Health

## 2020-04-16 ENCOUNTER — Other Ambulatory Visit: Payer: Self-pay

## 2020-04-16 VITALS — BP 143/85 | HR 71 | Ht 65.0 in | Wt 199.0 lb

## 2020-04-16 DIAGNOSIS — G43109 Migraine with aura, not intractable, without status migrainosus: Secondary | ICD-10-CM | POA: Diagnosis not present

## 2020-04-16 DIAGNOSIS — E785 Hyperlipidemia, unspecified: Secondary | ICD-10-CM | POA: Diagnosis not present

## 2020-04-16 DIAGNOSIS — Z8673 Personal history of transient ischemic attack (TIA), and cerebral infarction without residual deficits: Secondary | ICD-10-CM

## 2020-04-16 MED ORDER — TOPIRAMATE 50 MG PO TABS
50.0000 mg | ORAL_TABLET | Freq: Every day | ORAL | 3 refills | Status: DC
Start: 2020-04-16 — End: 2021-04-08

## 2020-04-16 MED ORDER — ATORVASTATIN CALCIUM 80 MG PO TABS: 80.0000 mg | ORAL_TABLET | Freq: Every day | ORAL | 3 refills | Status: DC

## 2020-04-16 NOTE — Progress Notes (Signed)
I agree with the above plan 

## 2020-04-16 NOTE — Patient Instructions (Addendum)
Restart topamax - refill provided  Continue current treatment place for stroke including aspirin and restarting atorvastatin     Follow up in 6 months or call earlier if needed

## 2020-04-16 NOTE — Progress Notes (Signed)
[facility] [address] [place]. [place] [address] [phone]       STROKE FOLLOW UP NOTE  [name] Date of Birth:  [birth year] Medical Record Number:  [id number]   Reason for visit: f/u TIA versus complicated migraine 2/59/5638    CHIEF COMPLAINT:  Chief Complaint  Patient presents with  . Follow-up    RM 14 alone Pt is doing well, just headaches after covid.    HPI:  Today, 04/16/2020, Jacqueline [name] returns for 22-month scheduled follow-up visit.  Remains on topiramate 50 mg nightly for migraine prophylaxis tolerating without side effects Does report COVID-19 positive in January with worsening of migraines but have been slowly improving and reports to mild headaches over the past 2 weeks.  She also reports post Covid brain fog and fatigue  She does plan on undergoing cataract surgery L eye shortly, underwent R eye procedure in November   Denies new or reoccurring stroke/TIA symptoms Reports compliance on aspirin 81mg  daily -denies side effects Blood pressure today 143/85 Recent A1c 7.2 ( prior 6.9) -routinely follows with endocrinology Lipid panel 04/02/2020 LDL 107 (prior 49 11/2018) obtained by PCP - does report stopping atorvastatin but unsure reason or when this was stopped -she believes possibly due to running out of refill  No further concerns at this time    History provided for reference purposes only Update 10/18/2019 JM: Jacqueline [name] returns for follow-up regarding TIA versus complicated migraine Currently on Topamax 50 mg nightly for migraine prevention tolerating dosage well without side effects She has not experienced any reoccurring migraine or complicated migraine symptoms or TIA symptoms She does report over the past month, she has been experiencing R upper orbital sharp "ping" type pain lasting for only a few seconds and then subsiding occurring 3-4 times per week.  She denies any visual changes associated.  Does plan on undergoing  cataract procedure on the right eye scheduled on 9/27 with symptoms of blurred vision and difficulty seeing at night.  She does report history of allergies and at times can have sinus pressure/congestion.  She is not currently on allergy medication. Previously referred to vestibular rehab for BPPV with improvement after Epley maneuver done by PT and did have recent episode of vertigo which subsided after performing Epley maneuver at home.  She has not had any reoccurring symptoms. Remains on aspirin 81 mg daily and atorvastatin 80 mg daily without side effects Blood pressure today 140/74 Glucose levels stable per patient currently managed by endocrinology She is concerned regarding weight gain but recently started participating in Silver sneakers program. No further concerns at this time  Update 03/22/2019: Jacqueline [name] is a 70 year old female who is being seen today for stroke follow-up.  She has been doing well from a stroke standpoint since prior visit.  She does endorse chronic history of vertigo appearing to present as BPPV with symptoms occurring with quick head movement or when lying down and changing positions.  She has not previously seen physical therapy for this concern.  No reoccurring headaches with ongoing use of Topamax 50 mg twice daily denies side effects.  Continues on aspirin and atorvastatin for secondary stroke prevention of side effects.  Blood pressure today stable.  Denies new or worsening stroke/TIA symptoms. Of note, patient does states she has been attempting to schedule appointment with PCP but due to illness with patient reporting cold-like symptoms she has been unable to be seen.  Recent COVID-19 testing negative.  Over the past couple days,  she has been experiencing sharp chest pain intermittently in the midsternum radiating towards the back and into her left arm.  This only lasts for a short duration and is worsened with increased exertion.  She denies any shortness of breath,  dizziness or lightheadedness.  She has previously been seen by cardiology but has not been seen recently.  Update 11/16/2018: Jacqueline [name] is being seen today for follow-up.  Continues on aspirin without bleeding or bruising.  At prior visit, lipid panel obtained with LDL 126 (prior 119).  Recommended to increase atorvastatin dose from 40 mg to 80 mg daily.  She continues on atorvastatin 80 mg daily without myalgias.  Lipid panels have not been repeated since that time.  Blood pressure today 130/74.  Established care with endocrinology with adjustments made to medication regimen. Improvement of glucose levels per patient report.  She is also working on weight loss with prior office visit weight 208lbs and today's weight 193lb.  She has been working hard on improving diet and exercise.  Continues on Topamax 50 mg twice daily. Has not had any additional migraine with left arm numbness.  Will have occasional headaches which she believes is related to tension/stress but resolves without intervention.  No further concerns at this time.  Initial visit 09/01/2018: Since discharge, she has experienced 2 additional headaches which were preceded by left arm numbness/tingling.  Symptoms similar to prior episodes.  She has continued on Topamax 50 mg nightly with only minimal daytime fatigue but did not limit her overall functioning.   She does endorse approximately 2 weeks prior, she woke up in the morning with tingling on her nose and swelling of top lip.  She took Benadryl throughout the day and resolved within 24 hours.  Symptoms consistent with allergic reaction to unknown modality as she did not change medications at that time, denies eating a different type of food or use of lip product.  Completed 3 weeks DAPT continues on aspirin alone without bleeding or bruising Previously taking atorvastatin 40 mg daily without side effects myalgias but has since run out of prescription Blood pressure 147/77 -does not routinely  monitor at home Glucose levels uncontrolled and is in the process of finding new PCP versus establishing care with endocrinology due to continued difficulty with diabetic management No further concerns at this time  Hospital admission 07/13/2018: Jacqueline [name] is an 70 y.o. Caucasian female with PMH of hypertension, hyperlipidemia, obesity and diabetes who presented to [facility] on 07/14/2018 with 3 episode of left arm and face numbness tingling for the last 3 days.  Initial episode lasting only 3 minutes and then resolved but followed by headache with bifrontal throbbing was lasted approximately 3 minutes.  She attributed this to empty stomach.  Second episode occurred the next day lasting approximately 3 minutes and then accompanied by headache lasting approximately 3 minutes which occurred prior to lunch.  Third episode occurred while eating lunch with similar symptoms plus feeling heaviness of left arm which lasted about 5 minutes followed by mild brief headache.  Denies any prior history of migraine or headaches.  She does endorse having visual auras with flashing lights in the past.  Stroke work-up unremarkable and likely right brain TIA given risk factors versus complicated migraine due to headache following each episode.  Stroke work-up completed once transferred to [facility] ED.  MRI no acute stroke but did show small deep white matter infarcts.  Vessel imaging and 2D echo unremarkable.  LDL 119 and  A1c 11.2.  Recommended DAPT for 3 weeks and aspirin alone.  HTN stable.  Initiated atorvastatin 40 mg daily for HLD management.  Recommended close PCP follow-up for uncontrolled DM.  Other stroke risk factors include advanced age, obesity and one episode of prior vision loss lasting 1 to 2 seconds approximately 3 to 4 years ago.  As all symptoms resolved without residual deficit, discharged home in stable condition without therapy needs. She was discharged in the afternoon on 07/14/2018 but returned to [facility]  ED that night due to recurrence of numbness in left shoulder and headache.  CT head unremarkable.  Was consulted by neurology and felt her symptoms were likely related to complex migraine as extensive stroke work-up unremarkable.  Discharged on Topamax and recommend follow-up with neurology outpatient.     ROS:   14 system review of systems performed and negative with exception of those listed in HPI PMH:  Past Medical History:  Diagnosis Date  . Diabetes mellitus   . HLD (hyperlipidemia)   . Hypertension   . Nephrolithiasis   . Obese     PSH:  Past Surgical History:  Procedure Laterality Date  . ROTATOR CUFF REPAIR  2009  . TUBAL LIGATION  1979    Social History:  Social History   Socioeconomic History  . Marital status: Married    Spouse name: Not on file  . Number of children: 4  . Years of education: Not on file  . Highest education level: Not on file  Occupational History  . Occupation: Retired-Missionary  Tobacco Use  . Smoking status: Never Smoker  . Smokeless tobacco: Never Used  Vaping Use  . Vaping Use: Never used  Substance and Sexual Activity  . Alcohol use: No  . Drug use: No  . Sexual activity: Yes    Partners: Male    Birth control/protection: Post-menopausal  Other Topics Concern  . Not on file  Social History Narrative   Retired Media planner   Husband is a Theme park manager- does some missions in [place]   2 great grandchildren (live in [place])   Married 86 years   4 children- 1 [place], 1  [place], 1 in [place], 1 at home   8 granchildren   Enjoys crafts   No pets   Social Determinants of Radio broadcast assistant Strain: Not on file  Food Insecurity: Not on file  Transportation Needs: Not on file  Physical Activity: Not on file  Stress: Not on file  Social Connections: Not on file  Intimate Partner Violence: Not on file    Family History:  Family History  Problem Relation Age of Onset  . Diabetes Father   . Heart  attack Father 6  . Heart disease Mother        hx cabg at 57  . Diabetes type II Brother   . Parkinson's disease Sister   . Brain cancer Sister   . Pancreatic cancer Brother   . Diabetes Mellitus II Sister   . Diabetes Mellitus II Sister   . CAD Sister   . Diabetes Mellitus II Sister   . Diabetes Mellitus II Sister   . Osteoarthritis Daughter   . AAA (abdominal aortic aneurysm) Daughter     Medications:   Current Outpatient Medications on File Prior to Visit  Medication Sig Dispense Refill  . aspirin EC 81 MG EC tablet Take 1 tablet (81 mg total) by mouth daily. 21 tablet 0  . BD PEN NEEDLE NANO 2ND GEN  32G X 4 MM MISC USE DAILY AS DIRECTED 100 each 2  . glucose blood (ACCU-CHEK GUIDE) test strip Use as instructed to test blood sugars 3 times daily E11.65 100 each 12  . insulin detemir (LEVEMIR) 100 UNIT/ML FlexPen Inject 20 Units into the skin daily. 30 mL 3  . Lancets (ACCU-CHEK MULTICLIX) lancets Use as instructed to test blood sugars 3 times daily E11.65 100 each 12  . metFORMIN (GLUCOPHAGE) 1000 MG tablet Take 1 tablet (1,000 mg total) by mouth 2 (two) times daily with a meal. 180 tablet 3   No current facility-administered medications on file prior to visit.    Allergies:   Allergies  Allergen Reactions  . Augmentin [Amoxicillin-Pot Clavulanate] Other (See Comments)    Altered mental status Did it involve swelling of the face/tongue/throat, SOB, or low BP? Unknown Did it involve sudden or severe rash/hives, skin peeling, or any reaction on the inside of your mouth or nose? Unknown Did you need to seek medical attention at a hospital or doctor's office? Unknown When did it last happen? unk If all above answers are "NO", may proceed with cephalosporin use.   Marland Kitchen Definity [Perflutren Lipid Microsphere] Other (See Comments)    Headache and facial flushing after Definity IVP given. No SOB or Chest pain,and Vital signs stable. Irven Baltimore, RN.  Marland Kitchen Novocain [Procaine]  Other (See Comments)    Altered mental status  . Promethazine Swelling    Tongue swelling     Physical Exam  Vitals:   04/16/20 0859  BP: (!) 143/85  Pulse: 71  Weight: 199 lb (90.3 kg)  Height: 5\' 5"  (1.651 m)   Body mass index is 33.12 kg/m. No exam data present  General: well developed, well nourished,  pleasant middle-age Caucasian female, seated, in no evident distress Head: head normocephalic and atraumatic.   Neck: supple with no carotid or supraclavicular bruits Cardiovascular: regular rate and rhythm, no murmurs Musculoskeletal: no deformity Skin:  no rash/petichiae Vascular:  Normal pulses all extremities   Neurologic Exam Mental Status: Awake and fully alert.  Fluent speech and language.  Oriented to place and time. Recent and remote memory intact. Attention span, concentration and fund of knowledge appropriate. Mood and affect appropriate.  Cranial Nerves: Pupils equal, briskly reactive to light. Extraocular movements full without nystagmus. Visual fields full to confrontation. Hearing intact. Facial sensation intact. Face, tongue, palate moves normally and symmetrically.  Motor: Normal bulk and tone. Normal strength in all tested extremity muscles. Sensory.: intact to touch , pinprick , position and vibratory sensation.  Coordination: Rapid alternating movements normal in all extremities. Finger-to-nose and heel-to-shin performed accurately bilaterally. Gait and Station: Arises from chair without difficulty. Stance is normal. Gait demonstrates normal stride length and balance without use of assistive device Reflexes: 1+ and symmetric. Toes downgoing.        ASSESSMENT: Jacqueline [name] is a 70 y.o. year old female here with right brain TIA given risk factors versus complicated migraine on 05/19/7024 after presenting with 3 episodes of intermittent numbness/tingling in left arm and face x3 days followed by headache.  Return to ED the evening after discharge due to  return of symptoms with diagnosis of likely complicated migraine with Topamax initiated with benefit.  Vascular risk factors include HTN, HLD, uncontrolled DM, chronic BPPV, obesity, prior stroke on imaging and Covid + 02/2020.     PLAN:  1. History of prior stroke and possible TIA:  a. Continue aspirin 81 mg daily  And restart  atorvastatin 80 mg daily for secondary stroke prevention.  Refill provided but request ongoing refills obtained by PCP b. Ensure close PCP and endocrinology follow-up for aggressive stroke risk factor management with HTN with BP goal<130/90, HLD with LDL goal<70 and Dm with A1c goal<7 2. Complicated migraine:  a. Slight worsening with recent Covid infection but slowly improving b. Continue Topamax 50 mg nightly for migraine prophylaxis -refill provided    Follow up in 6 months or call earlier if needed   CC:  [facility] provider: Dr.Sethi [name], NP    I spent 30 minutes of face-to-face and non-face-to-face time with patient.  This included previsit chart review, lab review, study review, order entry, electronic health record documentation, patient education and discussion regarding complicated migraine vs TIA, ongoing use of topiramate for migraine prophylaxis, importance of managing stroke risk factors and answered all other questions to patient satisfaction   Frann Rider, AGNP-BC  [facility] [address] [place], [place] [address]  Phone [phone] Fax [phone] Note: This document was prepared with digital dictation and possible smart phrase technology. Any transcriptional errors that result from this process are unintentional.

## 2020-06-08 ENCOUNTER — Telehealth: Payer: Medicare HMO | Admitting: Family

## 2020-06-08 ENCOUNTER — Telehealth (INDEPENDENT_AMBULATORY_CARE_PROVIDER_SITE_OTHER): Payer: Medicare HMO | Admitting: Family Medicine

## 2020-06-08 ENCOUNTER — Encounter: Payer: Self-pay | Admitting: Family Medicine

## 2020-06-08 ENCOUNTER — Other Ambulatory Visit: Payer: Self-pay

## 2020-06-08 DIAGNOSIS — J40 Bronchitis, not specified as acute or chronic: Secondary | ICD-10-CM

## 2020-06-08 MED ORDER — PREDNISONE 20 MG PO TABS: 40.0000 mg | ORAL_TABLET | Freq: Every day | ORAL | 0 refills | Status: AC

## 2020-06-08 MED ORDER — DOXYCYCLINE HYCLATE 100 MG PO TABS: 100.0000 mg | ORAL_TABLET | Freq: Two times a day (BID) | ORAL | 0 refills | Status: AC

## 2020-06-08 MED ORDER — GLIPIZIDE 5 MG PO TABS: ORAL_TABLET | ORAL | 0 refills | Status: DC

## 2020-06-08 NOTE — Progress Notes (Signed)
Chief Complaint  Patient presents with  . Sinusitis  . Cough  . Headache    Jacqueline [name] here for URI complaints. Due to COVID-19 pandemic, we are interacting via telephone. I verified patient's ID using 2 identifiers. Patient agreed to proceed with visit via this method. Patient is at home, I am at office. Patient and I are present for visit.   Duration: 8 days; travelled back from [facility] Associated symptoms: sinus headache, sinus congestion, sinus pain, rhinorrhea, itchy watery eyes, wheezing, shortness of breath and coughing Denies: ear pain, ear drainage, sore throat, myalgia and fevers Treatment to date: Benadryl, Dayquil Sick contacts: Yes - spouse  Past Medical History:  Diagnosis Date  . Diabetes mellitus   . HLD (hyperlipidemia)   . Hypertension   . Nephrolithiasis   . Obese    Objective No conversational dyspnea Age appropriate judgment and insight Nml affect and mood  Wheezy bronchitis - Plan: predniSONE (DELTASONE) 20 MG tablet, doxycycline (VIBRA-TABS) 100 MG tablet, glipiZIDE (GLUCOTROL) 5 MG tablet  I think this is allergy mediated. 5 d pred burst 40 mg/d. Doxy as pocket rx to take in 2 d if no better. Glipizide 5 mg/d while on prednisone.  Continue to push fluids, practice good hand hygiene, cover mouth when coughing. F/u prn. If starting to experience fevers, shaking, or shortness of breath, seek immediate care. Total time: 11 min Pt voiced understanding and agreement to the plan.  [place], DO 06/08/20 3:25 PM

## 2020-06-23 ENCOUNTER — Other Ambulatory Visit: Payer: Self-pay | Admitting: Family Medicine

## 2020-06-23 DIAGNOSIS — J40 Bronchitis, not specified as acute or chronic: Secondary | ICD-10-CM

## 2020-06-25 NOTE — Telephone Encounter (Signed)
Please advise 

## 2020-07-19 ENCOUNTER — Other Ambulatory Visit: Payer: Self-pay | Admitting: Family Medicine

## 2020-07-19 DIAGNOSIS — J40 Bronchitis, not specified as acute or chronic: Secondary | ICD-10-CM

## 2020-07-25 ENCOUNTER — Telehealth: Payer: Self-pay | Admitting: Adult Health

## 2020-07-25 DIAGNOSIS — H8110 Benign paroxysmal vertigo, unspecified ear: Secondary | ICD-10-CM

## 2020-07-25 NOTE — Telephone Encounter (Signed)
I called patient again and she picked up.  She states the last day and a half she noted positional vertigo her head turning to the left mainly when she lies down going to sleep.  Lasting several seconds similar to what she had back in 2021 when she went to physical therapy for vestibular rehab.  She did try the exercises they gave her that she recalls but she noted that it had not really helped.  She has an appointment Friday with her eye surgeon that did cataract surgery a month ago just as FYI.  She has an appointment in September with [place].   She did put a call into her primary care doctor which I told her was good they may be able to see her sooner than we would and be able to send her to physical therapy vestibular rehab as well.  But I told her I would relay the info to [place] and depending on that we will go from there.  Patient verbalized understanding

## 2020-07-25 NOTE — Telephone Encounter (Signed)
Called pt and no answer.  

## 2020-07-25 NOTE — Telephone Encounter (Signed)
Pt called, started having vertigo yesterday. Would like a call from the nurse to discuss if can be worked in.

## 2020-07-30 NOTE — Telephone Encounter (Signed)
I spoke to the patient. Dizziness is still problematic for her. She has not been able to see her PCP. She would like to proceed with our office placing the order for vestibular rehab.  Orders placed in Epic. The patient is aware to expect a call from PT for scheduling.

## 2020-07-30 NOTE — Telephone Encounter (Signed)
Referral for [facility]ibular rehab sent to [facility]. P: [phone].

## 2020-07-30 NOTE — Telephone Encounter (Signed)
I previously saw her for BPPV with benefit of vestibular rehab.  Unable to verify if she contacted her PCP to obtain order - if not, okay to place order for vestibular rehab. Thank you.

## 2020-07-31 ENCOUNTER — Ambulatory Visit: Payer: Medicare HMO | Admitting: Family

## 2020-07-31 ENCOUNTER — Other Ambulatory Visit: Payer: Self-pay

## 2020-07-31 ENCOUNTER — Ambulatory Visit: Payer: Medicare HMO | Attending: Adult Health | Admitting: Physical Therapy

## 2020-07-31 ENCOUNTER — Encounter: Payer: Self-pay | Admitting: Physical Therapy

## 2020-07-31 DIAGNOSIS — H8111 Benign paroxysmal vertigo, right ear: Secondary | ICD-10-CM | POA: Diagnosis present

## 2020-07-31 NOTE — Therapy (Signed)
[facility] [address] [place], [place], [address] Phone: [phone]   Fax:  [phone]  Physical Therapy Evaluation  Patient Details  Name: Jacqueline [name] MRN: [id number] Date of Birth: December 31, 1950 Referring Provider (PT): Frann Rider, NP   Encounter Date: 07/31/2020   PT End of Session - 07/31/20 1254     Visit Number 1    Number of Visits 4    Date for PT Re-Evaluation 08/31/20    Authorization Type [employer]    Authorization Time Period 07-31-20 - 09-30-20    PT Start Time 1147    PT Stop Time 6812    PT Time Calculation (min) 48 min    Activity Tolerance Patient tolerated treatment well    Behavior During Therapy [facility] for tasks assessed/performed             Past Medical History:  Diagnosis Date   Diabetes mellitus    HLD (hyperlipidemia)    Hypertension    Nephrolithiasis    Obese     Past Surgical History:  Procedure Laterality Date   ROTATOR CUFF REPAIR  2009   TUBAL LIGATION  1979    There were no vitals filed for this visit.    Subjective Assessment - 07/31/20 1245     Subjective Pt states vertigo started approx. 2 weeks - reports has had a feeling of nausea accompanying the vertigo.    Patient Stated Goals Resolve the vertigo    Pain Score 6     Pain Location Back    Pain Orientation Right    Pain Descriptors / Indicators Aching;Radiating    Pain Type Chronic pain    Pain Onset More than a month ago    Pain Frequency Intermittent    Aggravating Factors  lifting    Pain Relieving Factors massage and medication help                [facility] PT Assessment - 07/31/20 0001       Assessment   Medical Diagnosis BPPV    Referring Provider (PT) Frann Rider, NP    Onset Date/Surgical Date 07/24/20    Prior Therapy pt received treatment for BPPV in March 2021 at this facility      Precautions   Precautions None      Balance Screen   Has the patient fallen in the past  6 months No    Has the patient had a decrease in activity level because of a fear of falling?  No    Is the patient reluctant to leave their home because of a fear of falling?  No      Prior Function   Level of Independence Independent                    Vestibular Assessment - 07/31/20 0001       Symptom Behavior   Subjective history of current problem pt reports episode started on 07-24-20; reports she gets dizzy when she rolls onto her right side in bed    Type of Dizziness  Spinning    Frequency of Dizziness varies - depends on the movement    Duration of Dizziness minutes to seconds    Symptom Nature Positional    Aggravating Factors Rolling to right    Relieving Factors Head stationary;Rest    Progression of Symptoms No change since onset    History of similar episodes March 2021      Positional  Testing   Dix-Hallpike Dix-Hallpike Right;Dix-Hallpike Left    Sidelying Test Sidelying Right;Sidelying Left      Dix-Hallpike Right   Dix-Hallpike Right Duration approx. 15 secs    Dix-Hallpike Right Symptoms Upbeat, right rotatory nystagmus      Dix-Hallpike Left   Dix-Hallpike Left Duration none    Dix-Hallpike Left Symptoms No nystagmus      Sidelying Right   Sidelying Right Duration approx 10 secs    Sidelying Right Symptoms Upbeat, right rotatory nystagmus      Sidelying Left   Sidelying Left Duration none    Sidelying Left Symptoms No nystagmus                Objective measurements completed on examination: See above findings.               PT Education - 07/31/20 1251     Education Details pt and husband educated in Epley maneuver for Rt BPPV and also in Brandt-Daroff exercises    Person(s) Educated Patient;Spouse    Methods Explanation;Demonstration;Handout    Comprehension Verbalized understanding;Returned demonstration                 PT Long Term Goals - 07/31/20 1300       PT LONG TERM GOAL #1   Title Pt will have  a (-) Rt Dix-Hallpike test with no nystagmus and no c/o vertigo to indicate resolution of Rt BPPV.    Time 4    Period Weeks    Status New    Target Date 08/31/20      PT LONG TERM GOAL #2   Title Independent in HEP ([place]) for self treatmnent prn.    Time 4    Period Weeks    Status New    Target Date 08/31/20      PT LONG TERM GOAL #3   Title Pt will report no vertigo with bed mobility.    Time 4    Period Weeks    Status New    Target Date 08/31/20                    Plan - 07/31/20 1255     Clinical Impression Statement Pt is a 70 yr old lady with (+) Rt Dix-Hallpike test with Rt rotary upbeating nystagmus and c/o vertigo, indicative of Rt posterior canalithiasis.  Pt will benefit from canalith repositioning maneuver to treat Rt BPPV.    Personal Factors and Comorbidities Comorbidity 1    Comorbidities h/o TIA, DM type 2, migraine    Examination-Activity Limitations Locomotion Level;Reach Overhead;Transfers    Examination-Participation Restrictions Cleaning;Community Activity;Driving;Interpersonal Relationship;Laundry;Shop;Meal Prep    Stability/Clinical Decision Making Stable/Uncomplicated    Clinical Decision Making Low    Rehab Potential Good    PT Frequency 1x / week    PT Duration 4 weeks    PT Treatment/Interventions ADLs/Self Care Home Management;Canalith Repostioning;Balance training;Neuromuscular re-education;Therapeutic activities;Therapeutic exercise;Patient/family education    PT Next Visit Plan recheck Rt BPPV - Epley prn    Consulted and Agree with Plan of Care Patient;Family member/caregiver    Family Member Consulted husband             Patient will benefit from skilled therapeutic intervention in order to improve the following deficits and impairments:  Dizziness, Decreased balance  Visit Diagnosis: BPPV (benign paroxysmal positional vertigo), right - Plan: PT plan of care cert/re-cert     Problem List Patient Active  Problem List  Diagnosis Date Noted   Hematuria 12/29/2018   Migraine without status migrainosus, not intractable 12/29/2018   Trigeminal neuralgia 12/29/2018   TIA (transient ischemic attack) 07/13/2018   Left-sided weakness 07/13/2018   Morbid obesity ([place]) 11/25/2013   HLD (hyperlipidemia) 11/25/2013   DM type 2 (diabetes mellitus, type 2) ([place]) 11/25/2013   Chest pain 11/04/2012    [place], PT 07/31/2020, 1:09 PM  [facility] [address] [place], [place], [address] Phone: [phone]   Fax:  [phone]  Name: Jacqueline [name] MRN: [id number] Date of Birth: 1950/03/26

## 2020-07-31 NOTE — Patient Instructions (Addendum)
Sit to Side-Lying    Sit on edge of bed. 1. Turn head 45 to right. 2. Maintain head position and lie down slowly on left side. Hold until symptoms subside. 3. Sit up slowly. Hold until symptoms subside. 4. Turn head 45 to left. 5. Maintain head position and lie down slowly on right side. Hold until symptoms subside. 6. Sit up slowly. Repeat sequence __5__ times per session. Do __3-5__ sessions per day.    Self Treatment for Right Posterior / Anterior Canalithiasis    Sitting on bed: 1. Turn head 45 right. (a) Lie back slowly, shoulders on pillow, head on bed. (b) Hold _20___ seconds. 2. Keeping head on bed, turn head 90 left. Hold __20__ seconds. 3. Roll to left, head on 45 angle down toward bed. Hold _20___ seconds. 4. Sit up on left side of bed. Repeat __3__ times per session. Do __1-2__ sessions per day.  Copyright  VHI. All rights reserved.

## 2020-08-01 ENCOUNTER — Ambulatory Visit: Payer: Medicare HMO | Admitting: Internal Medicine

## 2020-08-02 ENCOUNTER — Other Ambulatory Visit: Payer: Self-pay | Admitting: Family

## 2020-08-02 DIAGNOSIS — J40 Bronchitis, not specified as acute or chronic: Secondary | ICD-10-CM

## 2020-08-16 ENCOUNTER — Ambulatory Visit: Payer: Medicare HMO | Admitting: Physical Therapy

## 2020-08-28 ENCOUNTER — Encounter: Payer: Self-pay | Admitting: Internal Medicine

## 2020-09-11 ENCOUNTER — Encounter: Payer: Self-pay | Admitting: Family

## 2020-09-12 ENCOUNTER — Observation Stay (HOSPITAL_COMMUNITY): Payer: Medicare HMO

## 2020-09-12 ENCOUNTER — Emergency Department (HOSPITAL_BASED_OUTPATIENT_CLINIC_OR_DEPARTMENT_OTHER): Payer: Medicare HMO

## 2020-09-12 ENCOUNTER — Other Ambulatory Visit: Payer: Self-pay

## 2020-09-12 ENCOUNTER — Encounter (HOSPITAL_BASED_OUTPATIENT_CLINIC_OR_DEPARTMENT_OTHER): Payer: Self-pay

## 2020-09-12 ENCOUNTER — Observation Stay (HOSPITAL_BASED_OUTPATIENT_CLINIC_OR_DEPARTMENT_OTHER)
Admission: EM | Admit: 2020-09-12 | Discharge: 2020-09-13 | Disposition: A | Discharge status: Home / Self Care | Payer: Medicare HMO | Attending: Internal Medicine | Admitting: Internal Medicine

## 2020-09-12 DIAGNOSIS — I679 Cerebrovascular disease, unspecified: Secondary | ICD-10-CM | POA: Diagnosis not present

## 2020-09-12 DIAGNOSIS — R0781 Pleurodynia: Secondary | ICD-10-CM

## 2020-09-12 DIAGNOSIS — E1159 Type 2 diabetes mellitus with other circulatory complications: Secondary | ICD-10-CM

## 2020-09-12 DIAGNOSIS — Z20822 Contact with and (suspected) exposure to covid-19: Secondary | ICD-10-CM | POA: Diagnosis not present

## 2020-09-12 DIAGNOSIS — Z79899 Other long term (current) drug therapy: Secondary | ICD-10-CM | POA: Diagnosis not present

## 2020-09-12 DIAGNOSIS — R531 Weakness: Secondary | ICD-10-CM

## 2020-09-12 DIAGNOSIS — G459 Transient cerebral ischemic attack, unspecified: Secondary | ICD-10-CM | POA: Diagnosis not present

## 2020-09-12 DIAGNOSIS — Z8673 Personal history of transient ischemic attack (TIA), and cerebral infarction without residual deficits: Secondary | ICD-10-CM | POA: Diagnosis not present

## 2020-09-12 DIAGNOSIS — R0789 Other chest pain: Secondary | ICD-10-CM | POA: Insufficient documentation

## 2020-09-12 DIAGNOSIS — I6782 Cerebral ischemia: Secondary | ICD-10-CM | POA: Diagnosis not present

## 2020-09-12 DIAGNOSIS — M6281 Muscle weakness (generalized): Secondary | ICD-10-CM | POA: Insufficient documentation

## 2020-09-12 DIAGNOSIS — I10 Essential (primary) hypertension: Secondary | ICD-10-CM | POA: Diagnosis not present

## 2020-09-12 DIAGNOSIS — Z7982 Long term (current) use of aspirin: Secondary | ICD-10-CM | POA: Insufficient documentation

## 2020-09-12 DIAGNOSIS — E119 Type 2 diabetes mellitus without complications: Secondary | ICD-10-CM | POA: Diagnosis not present

## 2020-09-12 DIAGNOSIS — R29818 Other symptoms and signs involving the nervous system: Secondary | ICD-10-CM | POA: Diagnosis not present

## 2020-09-12 DIAGNOSIS — Y900 Blood alcohol level of less than 20 mg/100 ml: Secondary | ICD-10-CM | POA: Insufficient documentation

## 2020-09-12 DIAGNOSIS — Z794 Long term (current) use of insulin: Secondary | ICD-10-CM | POA: Insufficient documentation

## 2020-09-12 DIAGNOSIS — R202 Paresthesia of skin: Secondary | ICD-10-CM | POA: Diagnosis not present

## 2020-09-12 DIAGNOSIS — I619 Nontraumatic intracerebral hemorrhage, unspecified: Secondary | ICD-10-CM | POA: Diagnosis not present

## 2020-09-12 DIAGNOSIS — E785 Hyperlipidemia, unspecified: Secondary | ICD-10-CM | POA: Diagnosis present

## 2020-09-12 DIAGNOSIS — R200 Anesthesia of skin: Secondary | ICD-10-CM | POA: Diagnosis not present

## 2020-09-12 DIAGNOSIS — Z7984 Long term (current) use of oral hypoglycemic drugs: Secondary | ICD-10-CM | POA: Diagnosis not present

## 2020-09-12 DIAGNOSIS — I6523 Occlusion and stenosis of bilateral carotid arteries: Secondary | ICD-10-CM | POA: Diagnosis not present

## 2020-09-12 DIAGNOSIS — I639 Cerebral infarction, unspecified: Secondary | ICD-10-CM | POA: Diagnosis present

## 2020-09-12 DIAGNOSIS — R2990 Unspecified symptoms and signs involving the nervous system: Secondary | ICD-10-CM

## 2020-09-12 LAB — RESP PANEL BY RT-PCR (FLU A&B, COVID) ARPGX2
Influenza A by PCR: NEGATIVE
Influenza B by PCR: NEGATIVE
SARS Coronavirus 2 by RT PCR: NEGATIVE

## 2020-09-12 LAB — TROPONIN I (HIGH SENSITIVITY)
Troponin I (High Sensitivity): <2 ng/L (ref <18)
Troponin I (High Sensitivity): <2 ng/L (ref <18)

## 2020-09-12 LAB — APTT: aPTT: 25 seconds (ref 24–36)

## 2020-09-12 LAB — URINALYSIS, ROUTINE W REFLEX MICROSCOPIC
Bilirubin Urine: NEGATIVE
Glucose, UA: NEGATIVE mg/dL
Hgb urine dipstick: NEGATIVE
Ketones, ur: NEGATIVE mg/dL
Leukocytes,Ua: NEGATIVE
Nitrite: NEGATIVE
Protein, ur: NEGATIVE mg/dL
Specific Gravity, Urine: <1.005 — ABNORMAL LOW (ref 1.005–1.030)
pH: 7 (ref 5.0–8.0)

## 2020-09-12 LAB — RAPID URINE DRUG SCREEN, HOSP PERFORMED
Amphetamines: NOT DETECTED
Barbiturates: NOT DETECTED
Benzodiazepines: NOT DETECTED
Cocaine: NOT DETECTED
Opiates: NOT DETECTED
Tetrahydrocannabinol: NOT DETECTED

## 2020-09-12 LAB — COMPREHENSIVE METABOLIC PANEL
ALT: 25 U/L (ref 0–44)
AST: 23 U/L (ref 15–41)
Albumin: 4.8 g/dL (ref 3.5–5.0)
Alkaline Phosphatase: 84 U/L (ref 38–126)
Anion gap: 10 (ref 5–15)
BUN: 15 mg/dL (ref 8–23)
CO2: 26 mmol/L (ref 22–32)
Calcium: 9.1 mg/dL (ref 8.9–10.3)
Chloride: 103 mmol/L (ref 98–111)
Creatinine, Ser: 0.86 mg/dL (ref 0.44–1.00)
GFR, Estimated: >60 mL/min (ref >60)
Glucose, Bld: 195 mg/dL — ABNORMAL HIGH (ref 70–99)
Potassium: 3.8 mmol/L (ref 3.5–5.1)
Sodium: 139 mmol/L (ref 135–145)
Total Bilirubin: 0.5 mg/dL (ref 0.3–1.2)
Total Protein: 7.9 g/dL (ref 6.5–8.1)

## 2020-09-12 LAB — PROTIME-INR
INR: 0.9 (ref 0.8–1.2)
Prothrombin Time: 11.9 seconds (ref 11.4–15.2)

## 2020-09-12 LAB — CBC
HCT: 43.4 % (ref 36.0–46.0)
Hemoglobin: 15 g/dL (ref 12.0–15.0)
MCH: 30.5 pg (ref 26.0–34.0)
MCHC: 34.6 g/dL (ref 30.0–36.0)
MCV: 88.2 fL (ref 80.0–100.0)
Platelets: 213 10*3/uL (ref 150–400)
RBC: 4.92 MIL/uL (ref 3.87–5.11)
RDW: 12.3 % (ref 11.5–15.5)
WBC: 6.7 10*3/uL (ref 4.0–10.5)
nRBC: 0 % (ref 0.0–0.2)

## 2020-09-12 LAB — DIFFERENTIAL
Abs Immature Granulocytes: 0.02 10*3/uL (ref 0.00–0.07)
Basophils Absolute: 0 10*3/uL (ref 0.0–0.1)
Basophils Relative: 0 %
Eosinophils Absolute: 0.2 10*3/uL (ref 0.0–0.5)
Eosinophils Relative: 3 %
Immature Granulocytes: 0 %
Lymphocytes Relative: 27 %
Lymphs Abs: 1.9 10*3/uL (ref 0.7–4.0)
Monocytes Absolute: 0.5 10*3/uL (ref 0.1–1.0)
Monocytes Relative: 7 %
Neutro Abs: 4.2 10*3/uL (ref 1.7–7.7)
Neutrophils Relative %: 63 %

## 2020-09-12 LAB — CBG MONITORING, ED: Glucose-Capillary: 188 mg/dL — ABNORMAL HIGH (ref 70–99)

## 2020-09-12 LAB — ETHANOL: Alcohol, Ethyl (B): <10 mg/dL (ref <10)

## 2020-09-12 LAB — GLUCOSE, CAPILLARY
Glucose-Capillary: 116 mg/dL — ABNORMAL HIGH (ref 70–99)
Glucose-Capillary: 102 mg/dL — ABNORMAL HIGH (ref 70–99)

## 2020-09-12 MED ORDER — STROKE: EARLY STAGES OF RECOVERY BOOK
Freq: Once | Status: AC
  Filled 2020-09-12: qty 1

## 2020-09-12 MED ORDER — IBUPROFEN 200 MG PO TABS
400.0000 mg | ORAL_TABLET | Freq: Four times a day (QID) | ORAL | Status: DC | PRN
  Administered 2020-09-13: 400 mg via ORAL
  Filled 2020-09-12: qty 2

## 2020-09-12 MED ORDER — GADOBUTROL 1 MMOL/ML IV SOLN
10.0000 mL | Freq: Once | INTRAVENOUS | Status: AC | PRN
  Administered 2020-09-12: 10 mL via INTRAVENOUS

## 2020-09-12 MED ORDER — IOHEXOL 350 MG/ML SOLN
100.0000 mL | Freq: Once | INTRAVENOUS | Status: AC | PRN
  Administered 2020-09-12: 100 mL via INTRAVENOUS

## 2020-09-12 MED ORDER — INSULIN ASPART 100 UNIT/ML IJ SOLN: 0.0000 [IU] | Freq: Every day | INTRAMUSCULAR | Status: DC

## 2020-09-12 MED ORDER — ENOXAPARIN SODIUM 40 MG/0.4ML IJ SOSY
40.0000 mg | PREFILLED_SYRINGE | INTRAMUSCULAR | Status: DC
  Administered 2020-09-12: 40 mg via SUBCUTANEOUS
  Filled 2020-09-12: qty 0.4

## 2020-09-12 MED ORDER — INSULIN ASPART 100 UNIT/ML IJ SOLN
0.0000 [IU] | Freq: Three times a day (TID) | INTRAMUSCULAR | Status: DC
  Administered 2020-09-13: 1 [IU] via SUBCUTANEOUS
  Administered 2020-09-13: 2 [IU] via SUBCUTANEOUS

## 2020-09-12 NOTE — Progress Notes (Signed)
Received pt from [place], alert X4, implemented MD orders, call light in reach, no complications noted, V/S stable, paged admissions regarding pt's arrival to the unit.

## 2020-09-12 NOTE — Progress Notes (Signed)
Called by EDP from [place] 70/F w DM, HTN, prior h/o TIA vs Migraine, presented L sided weakness and numbness, now improving -Neuro consulting, CT head unremarkable -accepted to tele bed at [facility]   Domenic Polite, MD

## 2020-09-12 NOTE — ED Provider Notes (Signed)
[name] Provider Note   CSN: [id number] Arrival date & time: 09/12/20  1425     History Chief Complaint  Patient presents with   Numbness    Jacqueline [name] is a 70 y.o. female w/ hx of diabetes, HTN, HLD, presenting to ED with left arm heaviness and weakness.  She reports onset of symptoms 1 hour prior to arrival (approx 1:30 pm).  Reports her left arm and leg felt heavy, like she was dragging it, and numb.  Denies headache, chest pain, chest pressure, lightheadedness, or facial numbness. Reports hx of "mini stroke" in past with transient left arm weakness.  Since arriving in the ED she reports improvement of her symptoms, although left arm still feels "different."  HPI     Past Medical History:  Diagnosis Date   Diabetes mellitus    HLD (hyperlipidemia)    Hypertension    Nephrolithiasis    Obese    TIA (transient ischemic attack) 2020    Patient Active Problem List   Diagnosis Date Noted   Hematuria 12/29/2018   Migraine without status migrainosus, not intractable 12/29/2018   Trigeminal neuralgia 12/29/2018   TIA (transient ischemic attack) 07/13/2018   Left-sided weakness 07/13/2018   Morbid obesity ([place]) 11/25/2013   HLD (hyperlipidemia) 11/25/2013   DM type 2 (diabetes mellitus, type 2) ([place]) 11/25/2013   Chest pain 11/04/2012    Past Surgical History:  Procedure Laterality Date   ROTATOR CUFF REPAIR  2009   TUBAL LIGATION  1979     OB History   No obstetric history on file.     Family History  Problem Relation Age of Onset   Diabetes Father    Heart attack Father 61   Heart disease Mother        hx cabg at 77   Diabetes type II Brother    Parkinson's disease Sister    Brain cancer Sister    Pancreatic cancer Brother    Diabetes Mellitus II Sister    Diabetes Mellitus II Sister    CAD Sister    Diabetes Mellitus II Sister    Diabetes Mellitus II Sister    Osteoarthritis Daughter    AAA (abdominal aortic  aneurysm) Daughter     Social History   Tobacco Use   Smoking status: Never   Smokeless tobacco: Never  Vaping Use   Vaping Use: Never used  Substance Use Topics   Alcohol use: No   Drug use: No    Home Medications Prior to Admission medications   Medication Sig Start Date End Date Taking? Authorizing Provider  aspirin EC 81 MG EC tablet Take 1 tablet (81 mg total) by mouth daily. 07/15/18   Geradine Girt, DO  atorvastatin (LIPITOR) 80 MG tablet Take 1 tablet (80 mg total) by mouth daily at 6 PM. 04/16/20   Frann Rider, NP  BD PEN NEEDLE NANO 2ND GEN 32G X 4 MM MISC USE DAILY AS DIRECTED 01/16/20   Shamleffer, Melanie Crazier, MD  glipiZIDE (GLUCOTROL) 5 MG tablet TAKE 1 TAB DAILY WITH PREDNISONE. 07/19/20   [name], NP  glucose blood (ACCU-CHEK GUIDE) test strip Use as instructed to test blood sugars 3 times daily E11.65 10/04/18   Shamleffer, Melanie Crazier, MD  insulin detemir (LEVEMIR) 100 UNIT/ML FlexPen Inject 20 Units into the skin daily. 04/02/20   Shamleffer, Melanie Crazier, MD  Lancets (ACCU-CHEK MULTICLIX) lancets Use as instructed to test blood sugars 3 times daily E11.65 10/04/18  Shamleffer, Melanie Crazier, MD  metFORMIN (GLUCOPHAGE) 1000 MG tablet Take 1 tablet (1,000 mg total) by mouth 2 (two) times daily with a meal. 04/02/20   Shamleffer, Melanie Crazier, MD  topiramate (TOPAMAX) 50 MG tablet Take 1 tablet (50 mg total) by mouth at bedtime. 04/16/20   Frann Rider, NP    Allergies    Augmentin [amoxicillin-pot clavulanate], Definity [perflutren lipid microsphere], Novocain [procaine], and Promethazine  Review of Systems   Review of Systems  Constitutional:  Negative for chills and fever.  HENT:  Negative for ear pain and sore throat.   Eyes:  Negative for pain and visual disturbance.  Respiratory:  Negative for cough and shortness of breath.   Cardiovascular:  Negative for chest pain and palpitations.  Gastrointestinal:  Negative for abdominal pain  and vomiting.  Genitourinary:  Negative for dysuria and hematuria.  Musculoskeletal:  Negative for arthralgias and back pain.  Skin:  Negative for color change and rash.  Neurological:  Positive for weakness and numbness. Negative for dizziness, tremors, seizures, syncope, speech difficulty, light-headedness and headaches.  All other systems reviewed and are negative.  Physical Exam Updated Vital Signs BP (!) 153/88   Temp 97.9 F (36.6 C) (Oral)   Ht '5\' 5"'$  (1.651 m)   BMI 33.12 kg/m   Physical Exam Constitutional:      General: She is not in acute distress. HENT:     Head: Normocephalic and atraumatic.  Eyes:     Conjunctiva/sclera: Conjunctivae normal.     Pupils: Pupils are equal, round, and reactive to light.  Cardiovascular:     Rate and Rhythm: Normal rate and regular rhythm.     Pulses: Normal pulses.  Pulmonary:     Effort: Pulmonary effort is normal. No respiratory distress.  Abdominal:     General: There is no distension.     Tenderness: There is no abdominal tenderness.  Skin:    General: Skin is warm and dry.  Neurological:     Mental Status: She is alert and oriented to person, place, and time. Mental status is at baseline.     Cranial Nerves: No cranial nerve deficit.     Comments: + pronator drift left arm Difficulty finger to nose left arm 4/5 strength left upper and lower extremities  Psychiatric:        Mood and Affect: Mood normal.        Behavior: Behavior normal.    ED Results / Procedures / Treatments   Labs (all labs ordered are listed, but only abnormal results are displayed) Labs Reviewed  RESP PANEL BY RT-PCR (FLU A&B, COVID) ARPGX2  CBC  DIFFERENTIAL  ETHANOL  PROTIME-INR  APTT  COMPREHENSIVE METABOLIC PANEL  RAPID URINE DRUG SCREEN, HOSP PERFORMED  URINALYSIS, ROUTINE W REFLEX MICROSCOPIC  TROPONIN I (HIGH SENSITIVITY)    EKG None  Radiology No results found.  Procedures Procedures   Medications Ordered in  ED Medications - No data to display  ED Course  I have reviewed the triage vital signs and the nursing notes.  Pertinent labs & imaging results that were available during my care of the patient were reviewed by me and considered in my medical decision making (see chart for details).  Left sided weakness, CODE STROKE activated with onset of symptoms 1 hour prior to arrival.  Patient reporting rapid improvement of her symptoms since arrival.  No significant evidence of LVO, very mild weakness in left body, no visual deficits or speech deficits.  Tele neurology  evaluation of patient  CT imaging and labs pending  EDP Dr. Roslynn Amble assumed care of patient at 3 pm pending neuro consult and imaging.  In my opinion there is no immediate indication for tPA with patient clinically improving and minimal symptoms on exam, but will consult with the neurologist.   Final Clinical Impression(s) / ED Diagnoses Final diagnoses:  None    Rx / DC Orders ED Discharge Orders     None        Wyvonnia Dusky, MD 09/12/20 1515

## 2020-09-12 NOTE — ED Notes (Signed)
Called code stroke

## 2020-09-12 NOTE — ED Triage Notes (Signed)
C/o sudden onset left arm numbness x 1 hr ago, left leg heaviness and HX CVA

## 2020-09-12 NOTE — Consult Note (Signed)
[facility] Telemedicine Consult   Requesting Provider: M. Langston Masker, MD Consult Participants: Dr. Jerelyn Charles, Telespecialist RN-Deanna    Bedside RN Location of the provider: [facility] Location of the patient: [facility]-emergency room-Bed 1  This consult was provided via telemedicine with 2-way video and audio communication. The patient/family was informed that care would be provided in this way and agreed to receive care in this manner.    Chief Complaint: Left-sided weakness/numbness  HPI: 70 year old, past medical history of hypertension, hyperlipidemia, obesity, diabetes, TIA versus complex migraine for left-sided tingling and numbness for which she has been seen in the hospital as well as follows with the neurology clinic, recent vertiginous symptoms, presenting with left-sided numbness with last known well of about 10:45 AM today. She was in her basement doing some things around the house when she had sudden onset of numbness on the left side.  She has no headache.  Usually her complex migraine symptoms are associated with a headache but no hedache today. She was brought in for emergent evaluation to [facility] where a code stroke was activated due to the sudden onset of focal neurological symptoms. On my examination-please see my NIH stroke scale below  According to the husband who also provided some history as she was getting her CAT scan-she is on no new meds and no medication changes have been made recently. Has been having vertigo issues on and off for a year. She went in for a procedure at the neurology clinic - vestibular therapy to adjust the crystals in her urine that has helped.. No recent stressors out of the ordinary. Is on aspirin.  Symptoms rapidly improving.  Past Medical History:  Diagnosis Date   Diabetes mellitus    HLD (hyperlipidemia)    Hypertension    Nephrolithiasis    Obese     No current facility-administered medications  for this encounter.  Current Outpatient Medications:    aspirin EC 81 MG EC tablet, Take 1 tablet (81 mg total) by mouth daily., Disp: 21 tablet, Rfl: 0   atorvastatin (LIPITOR) 80 MG tablet, Take 1 tablet (80 mg total) by mouth daily at 6 PM., Disp: 90 tablet, Rfl: 3   BD PEN NEEDLE NANO 2ND GEN 32G X 4 MM MISC, USE DAILY AS DIRECTED, Disp: 100 each, Rfl: 2   glipiZIDE (GLUCOTROL) 5 MG tablet, TAKE 1 TAB DAILY WITH PREDNISONE., Disp: 30 tablet, Rfl: 0   glucose blood (ACCU-CHEK GUIDE) test strip, Use as instructed to test blood sugars 3 times daily E11.65, Disp: 100 each, Rfl: 12   insulin detemir (LEVEMIR) 100 UNIT/ML FlexPen, Inject 20 Units into the skin daily., Disp: 30 mL, Rfl: 3   Lancets (ACCU-CHEK MULTICLIX) lancets, Use as instructed to test blood sugars 3 times daily E11.65, Disp: 100 each, Rfl: 12   metFORMIN (GLUCOPHAGE) 1000 MG tablet, Take 1 tablet (1,000 mg total) by mouth 2 (two) times daily with a meal., Disp: 180 tablet, Rfl: 3   topiramate (TOPAMAX) 50 MG tablet, Take 1 tablet (50 mg total) by mouth at bedtime., Disp: 90 tablet, Rfl: 3  LKW: 1045 hrs today tpa given?: No, likely complex migraine versus electrographic disturbance IR Thrombectomy? No, exam not consistent with LVO Modified Rankin Scale: 0-Completely asymptomatic and back to baseline post- stroke Time of teleneurologist evaluation: [id number]  Exam: Vitals:   09/12/20 1455  BP: (!) 153/88   General: Awake alert in no distress HEENT: Normocephalic/atraumatic Neurological Awake alert in no distress.  Oriented  x3.  No dysarthria, no aphasia.  No cranial nerve deficits.  On motor examination there is a mild left upper and lower extremity drift but when the nurse checked his strength, no frank weakness.  Sensation intact to touch without extinction.  No dysmetria. NIH stroke scale 2-1 for arm drift 1 for leg drift which was also rapidly improving.    Imaging Reviewed: CT head without contrast with no acute  changes.  Revisualized small area of encephalomalacia in the superior right frontal lobe-likely an old stroke  Labs reviewed in epic and pertinent values follow: CBC    Component Value Date/Time   WBC 6.7 09/12/2020 1446   RBC 4.92 09/12/2020 1446   HGB 15.0 09/12/2020 1446   HCT 43.4 09/12/2020 1446   PLT 213 09/12/2020 1446   MCV 88.2 09/12/2020 1446   MCH 30.5 09/12/2020 1446   MCHC 34.6 09/12/2020 1446   RDW 12.3 09/12/2020 1446   LYMPHSABS 1.9 09/12/2020 1446   MONOABS 0.5 09/12/2020 1446   EOSABS 0.2 09/12/2020 1446   BASOSABS 0.0 09/12/2020 1446   CMP     Component Value Date/Time   NA 141 04/02/2020 0832   K 5.0 04/02/2020 0832   CL 106 04/02/2020 0832   CO2 27 04/02/2020 0832   GLUCOSE 180 (H) 04/02/2020 0832   BUN 20 04/02/2020 0832   CREATININE 0.83 04/02/2020 0832   CALCIUM 9.6 04/02/2020 0832   PROT 7.5 12/22/2018 2235   ALBUMIN 4.5 12/22/2018 2235   AST 18 12/22/2018 2235   ALT 20 12/22/2018 2235   ALKPHOS 64 12/22/2018 2235   BILITOT 0.5 12/22/2018 2235   GFRNONAA >60 12/22/2018 2235   GFRAA >60 12/22/2018 2235   Assessment: 70 year old with above past medical history presenting with sudden onset of left-sided tingling and numbness with rapidly improving symptoms.  In the past she has had this in the context of either headache or migraine before or after the symptoms but today she had no headache whatsoever. This could still be a complex migraine but given the small area of encephalomalacia in the superior right frontal lobe, I wonder if this could be an electrographic disturbance akin to a seizure followed by some Todd's paralysis that causes subtle weakness.  Recommendations:  Admit to hospitalist EEG No antiepileptics for now Maintain seizure precautions MRI of the brain with and without contrast Frequent neurochecks Continue home aspirin I will notify my team at [facility] and they will evaluate her once she is there. Discussed with EDP  Dr. Roslynn Amble  Discussed my plan with the patient and her husband at the bedside over the camera as well.  This patient is receiving care for possible acute neurological changes. There was 40 minutes of care by this provider at the time of service, including time for direct evaluation via telemedicine, review of medical records, imaging studies and discussion of findings with providers, the patient and/or family.  -- Amie Portland, MD [facility] Pager: [phone] If 7pm to 7am, please call on call as listed on AMION.

## 2020-09-12 NOTE — ED Notes (Addendum)
Per husband, last normal was at 44 today, brought husband a biscuit and was "normal"

## 2020-09-12 NOTE — H&P (Signed)
History and Physical    Jacqueline [name] [id number] DOB: 21-Feb-1950 DOA: 09/12/2020  PCP: [name], NP Patient coming from: [facility]  Chief Complaint: Left-sided tingling, numbness, weakness  HPI: Jacqueline [name] is a 70 y.o. female with medical history significant of hypertension, hyperlipidemia, insulin-dependent type II diabetes, TIA versus complex migraine, obesity presented to the ED with complaints of left-sided tingling and numbness, last known well at about 10:45 AM today.  Code stroke activated.  CT head negative for acute finding.  CTA showing no intracranial LVO.  Symptoms rapidly improved. tPA not given as teleneurology felt that the symptoms were likely due to complex migraine versus electrographic disturbance.  Recommended hospital admission for further work-up.  Patient states around noon today she experienced acute onset tingling and numbness of her left arm.  Her arm felt heavy and she was not able to use it.  She then took her home aspirin and rested.  Soon after when she tried to walk she noticed that her left leg was also tingling, heavy, and numb.  She had difficulty walking.  Symptoms have now resolved.  Patient is also complaining of right-sided sharp lateral rib pain which started about a month ago and has been getting progressively worse despite taking over-the-counter analgesics.  Does report history of prior falls but does not recall falling recently.  Does report lifting a lot of objects at home on a routine basis.  Denies cough, shortness of breath, or chest pain.  No other complaints.  Review of Systems:  All systems reviewed and apart from history of presenting illness, are negative.  Past Medical History:  Diagnosis Date   Diabetes mellitus    HLD (hyperlipidemia)    Hypertension    Nephrolithiasis    Obese    TIA (transient ischemic attack) 2020    Past Surgical History:  Procedure Laterality Date   ROTATOR CUFF REPAIR  2009   TUBAL LIGATION  1979      reports that she has never smoked. She has never used smokeless tobacco. She reports that she does not drink alcohol and does not use drugs.  Allergies  Allergen Reactions   Augmentin [Amoxicillin-Pot Clavulanate] Other (See Comments)    Altered mental status Did it involve swelling of the face/tongue/throat, SOB, or low BP? Unknown Did it involve sudden or severe rash/hives, skin peeling, or any reaction on the inside of your mouth or nose? Unknown Did you need to seek medical attention at a hospital or doctor's office? Unknown When did it last happen? unk       If all above answers are "NO", may proceed with cephalosporin use.    Definity [Perflutren Lipid Microsphere] Other (See Comments)    Headache and facial flushing after Definity IVP given. No SOB or Chest pain,and Vital signs stable. Irven Baltimore, RN.   Novocain [Procaine] Other (See Comments)    Altered mental status   Promethazine Swelling    Tongue swelling    Family History  Problem Relation Age of Onset   Diabetes Father    Heart attack Father 64   Heart disease Mother        hx cabg at 73   Diabetes type II Brother    Parkinson's disease Sister    Brain cancer Sister    Pancreatic cancer Brother    Diabetes Mellitus II Sister    Diabetes Mellitus II Sister    CAD Sister    Diabetes Mellitus II Sister    Diabetes Mellitus II  Sister    Osteoarthritis Daughter    AAA (abdominal aortic aneurysm) Daughter     Prior to Admission medications   Medication Sig Start Date End Date Taking? Authorizing Provider  aspirin EC 81 MG EC tablet Take 1 tablet (81 mg total) by mouth daily. 07/15/18   Geradine Girt, DO  atorvastatin (LIPITOR) 80 MG tablet Take 1 tablet (80 mg total) by mouth daily at 6 PM. 04/16/20   Frann Rider, NP  BD PEN NEEDLE NANO 2ND GEN 32G X 4 MM MISC USE DAILY AS DIRECTED 01/16/20   Shamleffer, Melanie Crazier, MD  glipiZIDE (GLUCOTROL) 5 MG tablet TAKE 1 TAB DAILY WITH PREDNISONE. 07/19/20    [name], NP  glucose blood (ACCU-CHEK GUIDE) test strip Use as instructed to test blood sugars 3 times daily E11.65 10/04/18   Shamleffer, Melanie Crazier, MD  insulin detemir (LEVEMIR) 100 UNIT/ML FlexPen Inject 20 Units into the skin daily. 04/02/20   Shamleffer, Melanie Crazier, MD  Lancets (ACCU-CHEK MULTICLIX) lancets Use as instructed to test blood sugars 3 times daily E11.65 10/04/18   Shamleffer, Melanie Crazier, MD  metFORMIN (GLUCOPHAGE) 1000 MG tablet Take 1 tablet (1,000 mg total) by mouth 2 (two) times daily with a meal. 04/02/20   Shamleffer, Melanie Crazier, MD  topiramate (TOPAMAX) 50 MG tablet Take 1 tablet (50 mg total) by mouth at bedtime. 04/16/20   Frann Rider, NP    Physical Exam: Vitals:   09/12/20 1508 09/12/20 1510 09/12/20 1530 09/12/20 2017  BP: (!) 171/80 (!) 171/80 (!) 175/72 (!) 159/69  Pulse: 66 67 71 (!) 59  Resp: '14 16 20 20  '$ Temp: 97.9 F (36.6 C)   97.8 F (36.6 C)  TempSrc: Oral   Oral  SpO2: 99% 98% 99% 98%  Height:        Physical Exam Constitutional:      General: She is not in acute distress. HENT:     Head: Normocephalic and atraumatic.  Eyes:     Extraocular Movements: Extraocular movements intact.     Conjunctiva/sclera: Conjunctivae normal.  Cardiovascular:     Rate and Rhythm: Normal rate and regular rhythm.     Pulses: Normal pulses.  Pulmonary:     Effort: Pulmonary effort is normal. No respiratory distress.     Breath sounds: Normal breath sounds. No wheezing or rales.  Abdominal:     General: Bowel sounds are normal. There is no distension.     Palpations: Abdomen is soft.     Tenderness: There is no abdominal tenderness.  Musculoskeletal:        General: No swelling or tenderness.     Cervical back: Normal range of motion and neck supple.  Skin:    General: Skin is warm and dry.  Neurological:     General: No focal deficit present.     Mental Status: She is alert and oriented to person, place, and time.      Cranial Nerves: No cranial nerve deficit.     Sensory: No sensory deficit.     Motor: No weakness.     Labs on Admission: I have personally reviewed following labs and imaging studies  CBC: Recent Labs  Lab 09/12/20 1446  WBC 6.7  NEUTROABS 4.2  HGB 15.0  HCT 43.4  MCV 88.2  PLT 123456   Basic Metabolic Panel: Recent Labs  Lab 09/12/20 1446  NA 139  K 3.8  CL 103  CO2 26  GLUCOSE 195*  BUN 15  CREATININE  0.86  CALCIUM 9.1   GFR: CrCl cannot be calculated (Unknown ideal weight.). Liver Function Tests: Recent Labs  Lab 09/12/20 1446  AST 23  ALT 25  ALKPHOS 84  BILITOT 0.5  PROT 7.9  ALBUMIN 4.8   No results for input(s): LIPASE, AMYLASE in the last 168 hours. No results for input(s): AMMONIA in the last 168 hours. Coagulation Profile: Recent Labs  Lab 09/12/20 1446  INR 0.9   Cardiac Enzymes: No results for input(s): CKTOTAL, CKMB, CKMBINDEX, TROPONINI in the last 168 hours. BNP (last 3 results) No results for input(s): PROBNP in the last 8760 hours. HbA1C: No results for input(s): HGBA1C in the last 72 hours. CBG: Recent Labs  Lab 09/12/20 1505  GLUCAP 188*   Lipid Profile: No results for input(s): CHOL, HDL, LDLCALC, TRIG, CHOLHDL, LDLDIRECT in the last 72 hours. Thyroid Function Tests: No results for input(s): TSH, T4TOTAL, FREET4, T3FREE, THYROIDAB in the last 72 hours. Anemia Panel: No results for input(s): VITAMINB12, FOLATE, FERRITIN, TIBC, IRON, RETICCTPCT in the last 72 hours. Urine analysis:    Component Value Date/Time   COLORURINE YELLOW 09/12/2020 1530   APPEARANCEUR CLEAR 09/12/2020 1530   LABSPEC <1.005 (L) 09/12/2020 1530   PHURINE 7.0 09/12/2020 1530   GLUCOSEU NEGATIVE 09/12/2020 1530   HGBUR NEGATIVE 09/12/2020 1530   BILIRUBINUR NEGATIVE 09/12/2020 1530   KETONESUR NEGATIVE 09/12/2020 1530   PROTEINUR NEGATIVE 09/12/2020 1530   UROBILINOGEN 0.2 09/09/2010 1710   NITRITE NEGATIVE 09/12/2020 1530   LEUKOCYTESUR NEGATIVE  09/12/2020 1530    Radiological Exams on Admission: CT ANGIO HEAD CODE STROKE  Result Date: 09/12/2020 CLINICAL DATA:  Stroke. TIA. Assess extracranial arteries. Sudden onset of left arm numbness 1 hour ago. Left leg heaviness. EXAM: CT ANGIOGRAPHY HEAD AND NECK TECHNIQUE: Multidetector CT imaging of the head and neck was performed using the standard protocol during bolus administration of intravenous contrast. Multiplanar CT image reconstructions and MIPs were obtained to evaluate the vascular anatomy. Carotid stenosis measurements (when applicable) are obtained utilizing NASCET criteria, using the distal internal carotid diameter as the denominator. CONTRAST:  121m OMNIPAQUE IOHEXOL 350 MG/ML SOLN COMPARISON:  Head CT 07/14/2018 FINDINGS: CT HEAD FINDINGS Brain: No sign of acute infarction. No focal abnormality affects the brainstem or cerebellum. Old white matter infarction in the right frontal lobe. No mass, hemorrhage, hydrocephalus or extra-axial collection. Vascular: No acute vascular finding. Skull: Negative Sinuses: Clear Orbits: Normal Review of the MIP images confirms the above findings CTA NECK FINDINGS Aortic arch: Aortic atherosclerosis.  Branching pattern is normal. Right carotid system: Common carotid artery is widely patent to the bifurcation. Soft and calcified plaque at the carotid bifurcation and ICA bulb. Minimal diameter of the ICA bulb measures 3 mm. Compared to a more distal cervical ICA diameter of 5 mm, this indicates a 40% stenosis. Left carotid system: Common carotid artery widely patent to the bifurcation. Soft and calcified plaque at the carotid bifurcation and ICA bulb. Minimal diameter of the ICA bulb measures 4 mm. Compared to a more distal cervical ICA diameter of 5 mm, this indicates a 20% stenosis. Vertebral arteries: Both vertebral artery origins are widely patent. Both vertebral arteries appear normal through the cervical region to the foramen magnum Skeleton: Minimal  cervical spondylosis. Other neck: No mass or lymphadenopathy.  Normal Upper chest: Normal Review of the MIP images confirms the above findings CTA HEAD FINDINGS Anterior circulation: Both internal carotid arteries are patent through the skull base and siphon regions. No siphon stenosis. The  anterior and middle cerebral vessels are patent. No large vessel occlusion or correctable proximal stenosis. No aneurysm or vascular malformation. Posterior circulation: Both vertebral arteries are widely patent to the basilar. No basilar stenosis. Posterior circulation branch vessels are normal. Venous sinuses: Patent and normal. Anatomic variants: None significant. Review of the MIP images confirms the above findings IMPRESSION: No intracranial large vessel occlusion. Atherosclerotic disease at both carotid bifurcation and ICA bulb regions. 40% stenosis in the right ICA bulb. 20% stenosis in the left ICA bulb. No acute head CT finding. These results were called by telephone at the time of interpretation on 09/12/2020 at 3:16 pm to provider MATTHEW TRIFAN , who verbally acknowledged these results. Electronically Signed   By: Nelson Chimes M.D.   On: 09/12/2020 15:18   CT ANGIO NECK CODE STROKE  Result Date: 09/12/2020 CLINICAL DATA:  Stroke. TIA. Assess extracranial arteries. Sudden onset of left arm numbness 1 hour ago. Left leg heaviness. EXAM: CT ANGIOGRAPHY HEAD AND NECK TECHNIQUE: Multidetector CT imaging of the head and neck was performed using the standard protocol during bolus administration of intravenous contrast. Multiplanar CT image reconstructions and MIPs were obtained to evaluate the vascular anatomy. Carotid stenosis measurements (when applicable) are obtained utilizing NASCET criteria, using the distal internal carotid diameter as the denominator. CONTRAST:  146m OMNIPAQUE IOHEXOL 350 MG/ML SOLN COMPARISON:  Head CT 07/14/2018 FINDINGS: CT HEAD FINDINGS Brain: No sign of acute infarction. No focal abnormality  affects the brainstem or cerebellum. Old white matter infarction in the right frontal lobe. No mass, hemorrhage, hydrocephalus or extra-axial collection. Vascular: No acute vascular finding. Skull: Negative Sinuses: Clear Orbits: Normal Review of the MIP images confirms the above findings CTA NECK FINDINGS Aortic arch: Aortic atherosclerosis.  Branching pattern is normal. Right carotid system: Common carotid artery is widely patent to the bifurcation. Soft and calcified plaque at the carotid bifurcation and ICA bulb. Minimal diameter of the ICA bulb measures 3 mm. Compared to a more distal cervical ICA diameter of 5 mm, this indicates a 40% stenosis. Left carotid system: Common carotid artery widely patent to the bifurcation. Soft and calcified plaque at the carotid bifurcation and ICA bulb. Minimal diameter of the ICA bulb measures 4 mm. Compared to a more distal cervical ICA diameter of 5 mm, this indicates a 20% stenosis. Vertebral arteries: Both vertebral artery origins are widely patent. Both vertebral arteries appear normal through the cervical region to the foramen magnum Skeleton: Minimal cervical spondylosis. Other neck: No mass or lymphadenopathy.  Normal Upper chest: Normal Review of the MIP images confirms the above findings CTA HEAD FINDINGS Anterior circulation: Both internal carotid arteries are patent through the skull base and siphon regions. No siphon stenosis. The anterior and middle cerebral vessels are patent. No large vessel occlusion or correctable proximal stenosis. No aneurysm or vascular malformation. Posterior circulation: Both vertebral arteries are widely patent to the basilar. No basilar stenosis. Posterior circulation branch vessels are normal. Venous sinuses: Patent and normal. Anatomic variants: None significant. Review of the MIP images confirms the above findings IMPRESSION: No intracranial large vessel occlusion. Atherosclerotic disease at both carotid bifurcation and ICA bulb  regions. 40% stenosis in the right ICA bulb. 20% stenosis in the left ICA bulb. No acute head CT finding. These results were called by telephone at the time of interpretation on 09/12/2020 at 3:16 pm to provider MATTHEW TRIFAN , who verbally acknowledged these results. Electronically Signed   By: MNelson ChimesM.D.   On: 09/12/2020 15:18  EKG: Independently reviewed.  Sinus rhythm, no significant change since prior tracing.  Assessment/Plan Principal Problem:   Stroke-like symptoms Active Problems:   HLD (hyperlipidemia)   DM type 2 (diabetes mellitus, type 2) (HCC)   HTN (hypertension)   Acute onset left-sided tingling, numbness, weakness CT head negative for acute finding.  CTA showing no intracranial LVO.  Symptoms rapidly improved. tPA not given as teleneurology felt that the symptoms were likely due to complex migraine versus electrographic disturbance.  Recommended hospital admission for further work-up: -EEG -Neurology recommending no antiepileptics for now -Seizure precautions -MRI brain with and without contrast -Frequent neurochecks -Continue home aspirin after pharmacy med rec is done -I have notified neurology team that the patient is now here at [facility].  Right-sided lateral rib pain No obvious bruising noted on exam. -Pain management, x-ray ordered to rule out rib fracture  Hypertension -Allow permissive hypertension at this time, brain MRI pending  Hyperlipidemia -Continue statin after pharmacy med rec is done.  Check lipid panel.  Insulin-dependent type 2 diabetes A1c 7.2 on 04/02/2020. -Repeat A1c.  Hold home oral hypoglycemic agents.  Resume home basal insulin after pharmacy med rec is done.  Order sliding scale insulin sensitive ACHS.  DVT prophylaxis: Lovenox Code Status: Full code-discussed with the patient. Family Communication: No family available at this time. Disposition Plan: Status is: Observation  The patient remains OBS appropriate and  will d/c before 2 midnights.  Dispo: The patient is from: Home              Anticipated d/c is to: Home              Patient currently is not medically stable to d/c.   Difficult to place patient No  Consults called: Neurology Level of care: Level of care: Telemetry Medical  The medical decision making on this patient was of high complexity and the patient is at high risk for clinical deterioration, therefore this is a level 3 visit.  Shela Leff MD [facility]  If 7PM-7AM, please contact night-coverage [email or web address]  09/12/2020, 8:40 PM

## 2020-09-12 NOTE — ED Notes (Signed)
Patient transported to CT 

## 2020-09-13 ENCOUNTER — Observation Stay (HOSPITAL_COMMUNITY): Payer: Medicare HMO

## 2020-09-13 ENCOUNTER — Observation Stay (HOSPITAL_BASED_OUTPATIENT_CLINIC_OR_DEPARTMENT_OTHER): Payer: Medicare HMO

## 2020-09-13 DIAGNOSIS — I6389 Other cerebral infarction: Secondary | ICD-10-CM

## 2020-09-13 DIAGNOSIS — R2990 Unspecified symptoms and signs involving the nervous system: Secondary | ICD-10-CM | POA: Diagnosis not present

## 2020-09-13 DIAGNOSIS — R531 Weakness: Secondary | ICD-10-CM | POA: Diagnosis not present

## 2020-09-13 DIAGNOSIS — E1159 Type 2 diabetes mellitus with other circulatory complications: Secondary | ICD-10-CM | POA: Diagnosis not present

## 2020-09-13 DIAGNOSIS — E785 Hyperlipidemia, unspecified: Secondary | ICD-10-CM

## 2020-09-13 DIAGNOSIS — R202 Paresthesia of skin: Secondary | ICD-10-CM | POA: Diagnosis not present

## 2020-09-13 DIAGNOSIS — R0781 Pleurodynia: Secondary | ICD-10-CM | POA: Diagnosis not present

## 2020-09-13 DIAGNOSIS — I700 Atherosclerosis of aorta: Secondary | ICD-10-CM | POA: Diagnosis not present

## 2020-09-13 LAB — LIPID PANEL
Cholesterol: 209 mg/dL — ABNORMAL HIGH (ref 0–200)
HDL: 34 mg/dL — ABNORMAL LOW (ref >40)
LDL Cholesterol: 134 mg/dL — ABNORMAL HIGH (ref 0–99)
Total CHOL/HDL Ratio: 6.1 RATIO
Triglycerides: 203 mg/dL — ABNORMAL HIGH (ref <150)
VLDL: 41 mg/dL — ABNORMAL HIGH (ref 0–40)

## 2020-09-13 LAB — ECHOCARDIOGRAM COMPLETE
Area-P 1/2: 2.5 cm2
Height: 65 in
P 1/2 time: 747 msec
S' Lateral: 3 cm
Weight: 3216.95 oz

## 2020-09-13 LAB — HEMOGLOBIN A1C
Hgb A1c MFr Bld: 7.9 % — ABNORMAL HIGH (ref 4.8–5.6)
Mean Plasma Glucose: 180.03 mg/dL

## 2020-09-13 LAB — GLUCOSE, CAPILLARY
Glucose-Capillary: 152 mg/dL — ABNORMAL HIGH (ref 70–99)
Glucose-Capillary: 150 mg/dL — ABNORMAL HIGH (ref 70–99)

## 2020-09-13 MED ORDER — ROSUVASTATIN CALCIUM 40 MG PO TABS
40.0000 mg | ORAL_TABLET | Freq: Every day | ORAL | 2 refills | Status: DC
Start: 2020-09-13 — End: 2020-09-13

## 2020-09-13 MED ORDER — ASPIRIN 300 MG RE SUPP: 300.0000 mg | Freq: Every day | RECTAL | Status: DC

## 2020-09-13 MED ORDER — ROSUVASTATIN CALCIUM 40 MG PO TABS: 40.0000 mg | ORAL_TABLET | Freq: Every day | ORAL | 2 refills | Status: DC

## 2020-09-13 MED ORDER — CLOPIDOGREL BISULFATE 75 MG PO TABS
75.0000 mg | ORAL_TABLET | Freq: Every day | ORAL | Status: DC
  Administered 2020-09-13: 75 mg via ORAL
  Filled 2020-09-13: qty 1

## 2020-09-13 MED ORDER — PERFLUTREN LIPID MICROSPHERE
1.0000 mL | INTRAVENOUS | Status: AC | PRN
  Administered 2020-09-13: 2 mL via INTRAVENOUS
  Filled 2020-09-13: qty 10

## 2020-09-13 MED ORDER — CLOPIDOGREL BISULFATE 75 MG PO TABS: 75.0000 mg | ORAL_TABLET | Freq: Every day | ORAL | 0 refills | Status: AC

## 2020-09-13 MED ORDER — CLOPIDOGREL BISULFATE 75 MG PO TABS: 75.0000 mg | ORAL_TABLET | Freq: Every day | ORAL | 0 refills | Status: DC

## 2020-09-13 MED ORDER — ROSUVASTATIN CALCIUM 20 MG PO TABS: 40.0000 mg | ORAL_TABLET | Freq: Every day | ORAL | Status: DC

## 2020-09-13 MED ORDER — ACETAMINOPHEN 325 MG PO TABS
650.0000 mg | ORAL_TABLET | Freq: Four times a day (QID) | ORAL | Status: DC | PRN
Start: 2020-09-13 — End: 2020-09-13
  Administered 2020-09-13: 650 mg via ORAL
  Filled 2020-09-13: qty 2

## 2020-09-13 MED ORDER — ASPIRIN 81 MG PO CHEW
81.0000 mg | CHEWABLE_TABLET | Freq: Every day | ORAL | Status: DC
  Administered 2020-09-13: 81 mg via ORAL
  Filled 2020-09-13: qty 1

## 2020-09-13 NOTE — Progress Notes (Addendum)
STROKE TEAM PROGRESS NOTE    Interval History   No acute events overnight, patient is resting in bed with her husband at bedside.  She states her left-sided numbness is improving in the leg but she still has some numbness in the.arm  She was educated about her stroke, stroke risk factors and the need to transition from Lipitor to Crestor. Discussed with her using a PCSK9 inhibitor if oral statins were not enough to lower her cholesterol levels. LDL is 134.   Pertinent Lab Work and Imaging    09/12/20 CT Angio Head and Neck W WO IV Contrast No intracranial large vessel occlusion. Atherosclerotic disease at both carotid bifurcation and ICA bulb regions. 40% stenosis in the right ICA bulb. 20% stenosis in the left ICA bulb.  09/13/20 MRI Brain WO IV Contrast 1. Punctate foci of acute ischemia within the medial right frontal lobe. No hemorrhage or mass effect. ( Per personal read, stroke appears to be in the parietal lobe)  2. Multiple old small vessel infarcts of the right corona radiata.  09/13/20 Echocardiogram Complete  Results are pending   Physical Examination   Constitutional: Caucasian female resting in bed  Cardiovascular: Normal RR Respiratory: No increased WOB   Mental status: AAOx4 Speech: Fluent with repetition and naming intact  Cranial nerves: EOMI, VFF, Subtle left facial weakness, Tongue midline, Shoulder shrug intact  Motor: Normal bulk and tone. LLE drift. No drift LUE. She is antigravity throughout  Sensory: Intact to light touch throughout  Coordination: Intact FNF  Gait: Deferred   NIHSS: 1 for LLE drift   Assessment and Plan   [name] is a 70 y.o. female w/pmh of  hypertension, hyperlipidemia, obesity, diabetes, TIA versus complex migraine for left-sided tingling and numbness for which she has been seen in the hospital as well as follows with the neurology clinic, recent vertiginous symptoms who presents with left sided numbness.   #Right Parietal  subcortical stroke likely due to Small Vessel Disease  Patient presented with the symptoms described above. At this time her stroke work up is essentially complete aside from echocardiogram. MRI Brain showed a right parietal lobe stroke. CTA Head and Neck was pertinent for atherosclerotic disease at both carotid bifurcations. Echo results pending. Stroke labs w/LDL 134, hemoglobin a1c 7.9. Her stroke etiology is felt to be small vessel disease in the setting of hypertension, hyperlipidemia and diabetes.  - DAPT for 21 days followed by Plavix monotherapy ( DAPT started 09/13/20 end 10/04/20)  -Continue Crestor 40 mg for stroke prevention and stop Atorvastatin at discharge  -Continue to follow in stroke clinic at discharge, she sees Frann Rider, NP   #Hypertension She has a history of HTN, no antihypertensives are listed under her home medications. Currently blood pressure is trending 140-180. Recommend permissive hypertension 48 hours post stroke and from there, gradually reduce the blood pressure, avoiding any acute drops. Long term blood pressure goal is < 140/90. - Start anti hypertensive medications after permissive hypertension window ends   #Hyperlipidemia From a stroke prevention stand point, the LDL goal is < 70. Her LDL is 134, discussed transitioning from Atorvastatin to Crestor this admission and potentially using PCSK9 injections in the future for better cholesterol control.   #DMII  Hemoglobin A1C this admission noted to be 7.9. Goal from a stroke standpoint is < 7. Recommend management with SSI while inpatient.  - Follow up with PCP at discharge for DMII management    When stroke work up is complete  she is ok to discharge from a neurology standpoint.OT recommends no follow up, PT recommending outpatient PT with intermittent supervision and a rolling walker.   Hospital day # 0  Ruta Hinds, NP  [facility] Nurse Practitioner Patient seen and discussed with attending  physician Dr. Leonie Man   Stroke MD Note : I have personally obtained history,examined this patient, reviewed notes, independently viewed imaging studies, participated in medical decision making and plan of care.ROS completed by me personally and pertinent positives fully documented  I have made any additions or clarifications directly to the above note. Agree with note above.  Patient presented with left-sided numbness due to small right parietal periventricular white matter lacunar infarct likely from small vessel disease.  She is on atorvastatin 80 mg and yet LDL is suboptimal.  She ideally would need to be switched to PCSK9 inhibitor injections and since this can occur only in the outpatient setting after preauthorization I recommend we switch to Crestor 40 mg for now instead.  Dual antiplatelet therapy aspirin and Plavix for 3 weeks followed by Plavix alone.  Aggressive risk factor modification.  Long discussion with patient and answered questions.  Discussed with Dr. Talmage Nap.  Greater than 50% time during this 35-minute visit was spent on counseling and coordination of care about a lacunar stroke and numbness and discussion about stroke prevention and answering questions.  Stroke team will sign off.  Kindly call for questions  Antony Contras, MD Medical Director [place] Pager: [phone] 09/13/2020 3:03 PM   To contact Stroke Continuity provider, please refer to [email or web address] After hours, contact General Neurology

## 2020-09-13 NOTE — Plan of Care (Signed)
  Problem: Education: Goal: Knowledge of General Education information will improve Description: Including pain rating scale, medication(s)/side effects and non-pharmacologic comfort measures Outcome: Adequate for Discharge   Problem: Health Behavior/Discharge Planning: Goal: Ability to manage health-related needs will improve Outcome: Adequate for Discharge   Problem: Clinical Measurements: Goal: Ability to maintain clinical measurements within normal limits will improve Outcome: Adequate for Discharge Goal: Will remain free from infection Outcome: Adequate for Discharge Goal: Diagnostic test results will improve Outcome: Adequate for Discharge Goal: Respiratory complications will improve Outcome: Adequate for Discharge Goal: Cardiovascular complication will be avoided Outcome: Adequate for Discharge   Problem: Nutrition: Goal: Adequate nutrition will be maintained Outcome: Adequate for Discharge   Problem: Coping: Goal: Level of anxiety will decrease Outcome: Adequate for Discharge   Problem: Ischemic Stroke/TIA Tissue Perfusion: Goal: Complications of ischemic stroke/TIA will be minimized Outcome: Adequate for Discharge   Problem: Intracerebral Hemorrhage Tissue Perfusion: Goal: Complications of Intracerebral Hemorrhage will be minimized Outcome: Adequate for Discharge   Problem: Self-Care: Goal: Ability to participate in self-care as condition permits will improve Outcome: Adequate for Discharge

## 2020-09-13 NOTE — Evaluation (Signed)
Physical Therapy Evaluation Patient Details Name: Jacqueline [name] MRN: [id number] DOB: 1950/10/10 Today's Date: 09/13/2020   History of Present Illness  70 y.o. female presents to [facility] hospital as a transfer from [place] on 09/12/2020 with L-sided numbness and tingling. CT head and CTA negative. MRI demonstrates Punctate foci of acute ischemia within the medial right frontal  lobe. PMH includes hypertension, hyperlipidemia, insulin-dependent type II diabetes, TIA versus complex migraine, obesity.  Clinical Impression  Pt presents to PT with deficits in L sided strength, gait, balance, power, endurance. Pt ambulates with use of RW without physical assistance but does demonstrate L foot drop during session. Pt will benefit from aggressive mobilization and acute PT POC to improve strength and restore independence in mobility. PT recommends discharge home with RW and outpatient PT.    Follow Up Recommendations Outpatient PT;Supervision - Intermittent    Equipment Recommendations  Rolling walker with 5" wheels    Recommendations for Other Services       Precautions / Restrictions Precautions Precautions: Fall Restrictions Weight Bearing Restrictions: No      Mobility  Bed Mobility Overal bed mobility: Independent                  Transfers Overall transfer level: Needs assistance Equipment used: None Transfers: Sit to/from Stand Sit to Stand: Supervision            Ambulation/Gait Ambulation/Gait assistance: Min guard;Supervision Gait Distance (Feet): 120 Feet (additional 10' without device) Assistive device: Rolling walker (2 wheeled);None Gait Pattern/deviations: Decreased dorsiflexion - left Gait velocity: reduced Gait velocity interpretation: <1.8 ft/sec, indicate of risk for recurrent falls General Gait Details: pt requiring minG without use of walker demonstrating a short step-to gait with L foot drag. Improved lateral stability with use of RW, PT providing cues  for increased step height of LLE to improve foot clearance  Stairs            Wheelchair Mobility    Modified Rankin (Stroke Patients Only) Modified Rankin (Stroke Patients Only) Pre-Morbid Rankin Score: No symptoms Modified Rankin: Moderately severe disability     Balance Overall balance assessment: Needs assistance Sitting-balance support: No upper extremity supported;Feet supported Sitting balance-Leahy Scale: Good     Standing balance support: Bilateral upper extremity supported;Single extremity supported Standing balance-Leahy Scale: Poor Standing balance comment: reliant on UE support of walker                             Pertinent Vitals/Pain Pain Assessment: Faces Faces Pain Scale: Hurts even more Pain Location: low back Pain Descriptors / Indicators: Aching Pain Intervention(s): Monitored during session    Home Living Family/patient expects to be discharged to:: Private residence Living Arrangements: Spouse/significant other;Children Available Help at Discharge: Family;Available 24 hours/day Type of Home: House Home Access: Level entry     Home Layout: Multi-level;Laundry or work area in [employer]: None      Prior Function Level of Independence: Independent               Journalist, newspaper   Dominant Hand: Right    Extremity/Trunk Assessment   Upper Extremity Assessment Upper Extremity Assessment: Defer to OT evaluation    Lower Extremity Assessment Lower Extremity Assessment: LLE deficits/detail LLE Deficits / Details: grossly 4+/5 LLE Sensation: WNL       Communication   Communication: No difficulties  Cognition Arousal/Alertness: Awake/alert Behavior During Therapy: WFL for tasks assessed/performed Overall Cognitive Status:  Within Functional Limits for tasks assessed                                        General Comments General comments (skin integrity, edema, etc.): VSS on RA     Exercises     Assessment/Plan    PT Assessment Patient needs continued PT services  PT Problem List Decreased strength;Decreased activity tolerance;Decreased balance;Decreased mobility       PT Treatment Interventions DME instruction;Gait training;Stair training;Functional mobility training;Balance training;Therapeutic exercise;Therapeutic activities;Patient/family education    PT Goals (Current goals can be found in the Care Plan section)  Acute Rehab PT Goals Patient Stated Goal: to return to independent mobility PT Goal Formulation: With patient Time For Goal Achievement: 09/27/20 Potential to Achieve Goals: Good    Frequency Min 4X/week   Barriers to discharge        Co-evaluation               AM-PAC PT "6 Clicks" Mobility  Outcome Measure Help needed turning from your back to your side while in a flat bed without using bedrails?: None Help needed moving from lying on your back to sitting on the side of a flat bed without using bedrails?: None Help needed moving to and from a bed to a chair (including a wheelchair)?: A Little Help needed standing up from a chair using your arms (e.g., wheelchair or bedside chair)?: A Little Help needed to walk in hospital room?: A Little Help needed climbing 3-5 steps with a railing? : A Little 6 Click Score: 20    End of Session   Activity Tolerance: Patient tolerated treatment well Patient left: in bed;with call bell/phone within reach Nurse Communication: Mobility status PT Visit Diagnosis: Other abnormalities of gait and mobility (R26.89);Muscle weakness (generalized) (M62.81);Other symptoms and signs involving the nervous system (R29.898);Hemiplegia and hemiparesis Hemiplegia - Right/Left: Left Hemiplegia - dominant/non-dominant: Non-dominant Hemiplegia - caused by: Cerebral infarction    Time: 0921-0932 PT Time Calculation (min) (ACUTE ONLY): 11 min   Charges:   PT Evaluation $PT Eval Low Complexity: 1 Low           Jacqueline [name], PT, DPT Acute Rehabilitation Pager: [phone]   Jacqueline [name] 09/13/2020, 9:50 AM

## 2020-09-13 NOTE — Discharge Summary (Signed)
Physician Discharge Summary  Jacqueline [name] [id number] DOB: 1950/11/23 DOA: 09/12/2020  PCP: [name], NP  Admit date: 09/12/2020 Discharge date: 09/13/2020  Admitted From: Home  Discharge disposition: Home  Recommendations for Outpatient Follow-Up:   Follow up with your primary care provider in 1-2 weeks. Check CBC, BMP, magnesium in the next visit. Follow-up with [facility] as scheduled by the clinic. Patient was on Lipitor 80 mg but her cholesterol is a still uncontrolled.  Patient might benefit from PCSK9 inhibitors as outpatient.  At this time, this has been changed to Crestor '40mg'$ . She would benefit from better glycemic control as outpatient.  Discharge Diagnosis:   Principal Problem:   Acute CVA (cerebrovascular accident) ([place]) Active Problems:   HLD (hyperlipidemia)   DM type 2 (diabetes mellitus, type 2) ([place])   HTN (hypertension)   Discharge Condition: Improved.  Diet recommendation: Low sodium, heart healthy.  Carbohydrate-modified.    Wound care: None.  Code status: Full.   History of Present Illness:   Jacqueline [name] is a 70 y.o. female with medical history significant of hypertension, hyperlipidemia, insulin-dependent type II diabetes, TIA versus complex migraine, obesity presented to the ED with complaints of left-sided tingling and numbness.  Code stroke was activated.  CT head was negative for acute finding.  CTA showing no intracranial LVO.  Symptoms rapidly improved. TPA not given as teleneurology felt that the symptoms were likely due to complex migraine versus electrographic disturbance.  Recommended hospital admission for further work-up.  Hospital Course:   Following conditions were addressed during hospitalization as listed below,  Acute onset left-sided tingling, numbness, weakness due to acute CVA. CT head negative for acute finding.  CTA showing no intracranial LVO.  Symptoms rapidly improved. tPA not given. MRI  brain with and without contrast punctuate foci of acute ischemia within the medial right frontal lobe.  Patient will be continued on aspirin and Plavix.  Crestor will be prescribed instead of Lipitor due to elevated cholesterol.  Patient was advised to discuss with her primary care physician regarding possible transition to PCSK9 inhibitors as outpatient.  Seen by neurology prior to discharge.  Stable for disposition home with outpatient neurology follow-up   Right-sided lateral rib pain No obvious bruising noted on exam.    No acute issues at this time.  X-ray of the ribs without any acute process.  Treatment.   Hypertension As per history.  Not on medications at home.  Could start the medications as outpatient.  Currently on permissive hypertension window for   Hyperlipidemia On Lipitor 80 mg at home but cholesterol 209 and LDL cholesterol 134.  Patient might benefit from changing to PCSK9 inhibitors.  We will however change to Crestor '40mg'$  at this time on discharge.  Spoke with the patient about it.   Insulin-dependent type 2 diabetes A1c 7.2 on 04/02/2020.  A1c of 7.9 at this time.  Patient will be resumed on home insulin regimen on discharge.  Follow-up with primary care physician as outpatient.  Disposition.  At this time, patient is stable for disposition home with outpatient PCP and neurology follow-up.  Medical Consultants:   Neurology  Procedures:    None Subjective:   Today, patient was seen and examined at bedside.  Denies any dizziness, lightheadedness, fever chills or rigor but has mild headache.  States that her weakness and numbness has improved  Discharge Exam:   Vitals:   09/13/20 0752 09/13/20 1157  BP: (!) 147/82 (!) 162/72  Pulse: 60  75  Resp: 18 20  Temp: 98 F (36.7 C) 98.7 F (37.1 C)  SpO2: 95% 98%   Vitals:   09/13/20 0200 09/13/20 0357 09/13/20 0752 09/13/20 1157  BP: 126/72 (!) 150/61 (!) 147/82 (!) 162/72  Pulse: 69 (!) 52 60 75  Resp: '19 15 18  20  '$ Temp: 98.4 F (36.9 C) 98 F (36.7 C) 98 F (36.7 C) 98.7 F (37.1 C)  TempSrc: Oral Oral Oral Oral  SpO2: 100% 96% 95% 98%  Weight:      Height:       General: Alert awake, not in obvious distress HENT: pupils equally reacting to light,  No scleral pallor or icterus noted. Oral mucosa is moist.  Chest:  Clear breath sounds.  Diminished breath sounds bilaterally. No crackles or wheezes.  CVS: S1 &S2 heard. No murmur.  Regular rate and rhythm. Abdomen: Soft, nontender, nondistended.  Bowel sounds are heard.   Extremities: No cyanosis, clubbing or edema.  Peripheral pulses are palpable.  Mild left upper extremity weakness. Psych: Alert, awake and oriented, normal mood CNS:  No cranial nerve deficits.  Power equal in all extremities.   Skin: Warm and dry.  No rashes noted.  The results of significant diagnostics from this hospitalization (including imaging, microbiology, ancillary and laboratory) are listed below for reference.     Diagnostic Studies:   DG Ribs Unilateral W/Chest Right  Result Date: 09/13/2020 CLINICAL DATA:  Right rib pain. EXAM: RIGHT RIBS AND CHEST - 3+ VIEW COMPARISON:  Chest CT and radiograph dated 10/17/2016. FINDINGS: No focal consolidation, pleural effusion, or pneumothorax. The cardiac silhouette is within limits. Atherosclerotic calcification of the aortic arch. Osteopenia with degenerative changes of the spine and shoulders. No acute osseous pathology. No displaced or obvious rib fractures. IMPRESSION: 1. No acute cardiopulmonary process. 2. No displaced or obvious rib fractures. Electronically Signed   By: Anner Crete M.D.   On: 09/13/2020 00:41   MR BRAIN W WO CONTRAST  Result Date: 09/13/2020 CLINICAL DATA:  Neuro deficit, acute, stroke suspected EXAM: MRI HEAD WITHOUT AND WITH CONTRAST TECHNIQUE: Multiplanar, multiecho pulse sequences of the brain and surrounding structures were obtained without and with intravenous contrast. CONTRAST:  25m  GADAVIST GADOBUTROL 1 MMOL/ML IV SOLN COMPARISON:  07/14/2018 FINDINGS: Brain: Punctate foci of acute ischemia within medial right frontal lobe. No acute or chronic hemorrhage. There is multifocal hyperintense T2-weighted signal within the white matter. Generalized volume loss without a clear lobar predilection. Multiple old small vessel infarcts of the right corona radiata. The midline structures are normal. There is no abnormal contrast enhancement. Vascular: Major flow voids are preserved. Skull and upper cervical spine: Normal calvarium and skull base. Visualized upper cervical spine and soft tissues are normal. Sinuses/Orbits:No paranasal sinus fluid levels or advanced mucosal thickening. No mastoid or middle ear effusion. Normal orbits. IMPRESSION: 1. Punctate foci of acute ischemia within the medial right frontal lobe. No hemorrhage or mass effect. 2. Multiple old small vessel infarcts of the right corona radiata. Electronically Signed   By: KUlyses JarredM.D.   On: 09/13/2020 00:32   ECHOCARDIOGRAM COMPLETE  Result Date: 09/13/2020    ECHOCARDIOGRAM REPORT   Patient Name:   JMARLIN NAKAODate of Exam: 09/13/2020 Medical Rec #:  [id number]     Height:       65.0 in Accession #:    [id number]    Weight:       201.1 lb Date of Birth:  [birth year]  BSA:          1.983 m Patient Age:    29 years       BP:           147/82 mmHg Patient Gender: F              HR:           63 bpm. Exam Location:  Inpatient Procedure: 2D Echo, Cardiac Doppler, Color Doppler and Intracardiac            Opacification Agent Indications:    Stroke  History:        Patient has prior history of Echocardiogram examinations, most                 recent 07/14/2018. TIA; Risk Factors:Diabetes, Dyslipidemia and                 Hypertension.  Sonographer:    Bernadene Person RDCS Referring Phys: [id number] [place]  1. Left ventricular ejection fraction, by estimation, is 55 to 60%. The left ventricle has normal function.  The left ventricle has no regional wall motion abnormalities. There is mild left ventricular hypertrophy. Left ventricular diastolic parameters are consistent with Grade I diastolic dysfunction (impaired relaxation).  2. Right ventricular systolic function is normal. The right ventricular size is normal. There is normal pulmonary artery systolic pressure. The estimated right ventricular systolic pressure is [id number] mmHg.  3. The mitral valve is normal in structure. Trivial mitral valve regurgitation. No evidence of mitral stenosis.  4. The aortic valve is tricuspid. Aortic valve regurgitation is mild.  5. Aortic dilatation noted. There is borderline dilatation of the ascending aorta, measuring 37 mm.  6. The inferior vena cava is normal in size with greater than 50% respiratory variability, suggesting right atrial pressure of 3 mmHg. FINDINGS  Left Ventricle: Left ventricular ejection fraction, by estimation, is 55 to 60%. The left ventricle has normal function. The left ventricle has no regional wall motion abnormalities. Definity contrast agent was given IV to delineate the left ventricular  endocardial borders. The left ventricular internal cavity size was normal in size. There is mild left ventricular hypertrophy. Left ventricular diastolic parameters are consistent with Grade I diastolic dysfunction (impaired relaxation). Right Ventricle: The right ventricular size is normal. No increase in right ventricular wall thickness. Right ventricular systolic function is normal. There is normal pulmonary artery systolic pressure. The tricuspid regurgitant velocity is 1.91 m/s, and  with an assumed right atrial pressure of 3 mmHg, the estimated right ventricular systolic pressure is [id number] mmHg. Left Atrium: Left atrial size was normal in size. Right Atrium: Right atrial size was normal in size. Pericardium: Trivial pericardial effusion is present. Mitral Valve: The mitral valve is normal in structure. Trivial mitral valve  regurgitation. No evidence of mitral valve stenosis. Tricuspid Valve: The tricuspid valve is normal in structure. Tricuspid valve regurgitation is trivial. Aortic Valve: The aortic valve is tricuspid. Aortic valve regurgitation is mild. Aortic regurgitation PHT measures 747 msec. Pulmonic Valve: The pulmonic valve was normal in structure. Pulmonic valve regurgitation is trivial. Aorta: The aortic root is normal in size and structure and aortic dilatation noted. There is borderline dilatation of the ascending aorta, measuring 37 mm. Venous: The inferior vena cava is normal in size with greater than 50% respiratory variability, suggesting right atrial pressure of 3 mmHg. IAS/Shunts: No atrial level shunt detected by color flow Doppler.  LEFT VENTRICLE PLAX 2D LVIDd:  4.60 cm  Diastology LVIDs:         3.00 cm  LV e' medial:    3.41 cm/s LV PW:         1.20 cm  LV E/e' medial:  21.5 LV IVS:        0.90 cm  LV e' lateral:   6.73 cm/s LVOT diam:     2.10 cm  LV E/e' lateral: 10.9 LV SV:         70 LV SV Index:   35 LVOT Area:     3.46 cm  RIGHT VENTRICLE RV S prime:     9.16 cm/s TAPSE (M-mode): 1.6 cm LEFT ATRIUM             Index       RIGHT ATRIUM          Index LA diam:        3.20 cm 1.61 cm/m  RA Area:     8.76 cm LA Vol (A2C):   30.2 ml 15.23 ml/m RA Volume:   14.60 ml 7.36 ml/m LA Vol (A4C):   35.0 ml 17.65 ml/m LA Biplane Vol: 33.7 ml 17.00 ml/m  AORTIC VALVE LVOT Vmax:   91.20 cm/s LVOT Vmean:  56.800 cm/s LVOT VTI:    0.201 m AI PHT:      747 msec  AORTA Ao Root diam: 3.10 cm Ao Asc diam:  3.70 cm MITRAL VALVE               TRICUSPID VALVE MV Area (PHT): 2.50 cm    TR Peak grad:   14.6 mmHg MV Decel Time: 303 msec    TR Vmax:        191.00 cm/s MV E velocity: 73.20 cm/s MV A velocity: 98.50 cm/s  SHUNTS MV E/A ratio:  0.74        Systemic VTI:  0.20 m                            Systemic Diam: 2.10 cm Loralie Champagne MD Electronically signed by Loralie Champagne MD Signature Date/Time: 09/13/2020/2:59:07  PM    Final    VAS US CAROTID  Result Date: 09/13/2020 Carotid Arterial Duplex Study Patient Name:  [name]  Date of Exam:   09/13/2020 Medical Rec #: [id number]       Accession #:    [id number] Date of Birth: 08-09-1950       Patient Gender: F Patient Age:   70Y Exam Location:  [facility] Procedure:      VAS US CAROTID Referring Phys: Alferd Patee [facility] --------------------------------------------------------------------------------  Indications:       Stenosis. Risk Factors:      Hypertension, hyperlipidemia, Diabetes. Comparison Study:  no prior Performing Technologist: Archie Patten RVS  Examination Guidelines: A complete evaluation includes B-mode imaging, spectral Doppler, color Doppler, and power Doppler as needed of all accessible portions of each vessel. Bilateral testing is considered an integral part of a complete examination. Limited examinations for reoccurring indications may be performed as noted.  Right Carotid Findings: +----------+--------+--------+--------+-------------------------+--------+           PSV cm/sEDV cm/sStenosisPlaque Description       Comments +----------+--------+--------+--------+-------------------------+--------+ CCA Prox  127     15              heterogenous                      +----------+--------+--------+--------+-------------------------+--------+  CCA Distal66      22              heterogenous                      +----------+--------+--------+--------+-------------------------+--------+ ICA Prox  121     41      40-59%  heterogenous and calcific         +----------+--------+--------+--------+-------------------------+--------+ ICA Distal115     27                                                +----------+--------+--------+--------+-------------------------+--------+ ECA       140     11                                                +----------+--------+--------+--------+-------------------------+--------+  +----------+--------+-------+--------+-------------------+           PSV cm/sEDV cmsDescribeArm Pressure (mmHG) +----------+--------+-------+--------+-------------------+ Subclavian100                                        +----------+--------+-------+--------+-------------------+ +---------+--------+--+--------+-+---------+ VertebralPSV cm/s32EDV cm/s8Antegrade +---------+--------+--+--------+-+---------+  Left Carotid Findings: +----------+--------+--------+--------+------------------+--------+           PSV cm/sEDV cm/sStenosisPlaque DescriptionComments +----------+--------+--------+--------+------------------+--------+ CCA Prox  106     17              heterogenous               +----------+--------+--------+--------+------------------+--------+ CCA Distal72      17              heterogenous               +----------+--------+--------+--------+------------------+--------+ ICA Prox  87      18      1-39%   heterogenous               +----------+--------+--------+--------+------------------+--------+ ICA Distal78      21                                         +----------+--------+--------+--------+------------------+--------+ ECA       99      9                                          +----------+--------+--------+--------+------------------+--------+ +----------+--------+--------+--------+-------------------+           PSV cm/sEDV cm/sDescribeArm Pressure (mmHG) +----------+--------+--------+--------+-------------------+ [id number]                                          +----------+--------+--------+--------+-------------------+ +---------+--------+--+--------+--+---------+ VertebralPSV cm/s36EDV cm/s12Antegrade +---------+--------+--+--------+--+---------+   Summary: Right Carotid: Velocities in the right ICA are consistent with a 40-59%                stenosis. Left Carotid: Velocities in the left ICA are consistent with a  1-39% stenosis. Vertebrals: Bilateral vertebral arteries  demonstrate antegrade flow. *See table(s) above for measurements and observations.  Electronically signed by Antony Contras MD on 09/13/2020 at 12:17:20 PM.    Final    CT ANGIO HEAD CODE STROKE  Result Date: 09/12/2020 CLINICAL DATA:  Stroke. TIA. Assess extracranial arteries. Sudden onset of left arm numbness 1 hour ago. Left leg heaviness. EXAM: CT ANGIOGRAPHY HEAD AND NECK TECHNIQUE: Multidetector CT imaging of the head and neck was performed using the standard protocol during bolus administration of intravenous contrast. Multiplanar CT image reconstructions and MIPs were obtained to evaluate the vascular anatomy. Carotid stenosis measurements (when applicable) are obtained utilizing NASCET criteria, using the distal internal carotid diameter as the denominator. CONTRAST:  155m OMNIPAQUE IOHEXOL 350 MG/ML SOLN COMPARISON:  Head CT 07/14/2018 FINDINGS: CT HEAD FINDINGS Brain: No sign of acute infarction. No focal abnormality affects the brainstem or cerebellum. Old white matter infarction in the right frontal lobe. No mass, hemorrhage, hydrocephalus or extra-axial collection. Vascular: No acute vascular finding. Skull: Negative Sinuses: Clear Orbits: Normal Review of the MIP images confirms the above findings CTA NECK FINDINGS Aortic arch: Aortic atherosclerosis.  Branching pattern is normal. Right carotid system: Common carotid artery is widely patent to the bifurcation. Soft and calcified plaque at the carotid bifurcation and ICA bulb. Minimal diameter of the ICA bulb measures 3 mm. Compared to a more distal cervical ICA diameter of 5 mm, this indicates a 40% stenosis. Left carotid system: Common carotid artery widely patent to the bifurcation. Soft and calcified plaque at the carotid bifurcation and ICA bulb. Minimal diameter of the ICA bulb measures 4 mm. Compared to a more distal cervical ICA diameter of 5 mm, this indicates a 20% stenosis. Vertebral  arteries: Both vertebral artery origins are widely patent. Both vertebral arteries appear normal through the cervical region to the foramen magnum Skeleton: Minimal cervical spondylosis. Other neck: No mass or lymphadenopathy.  Normal Upper chest: Normal Review of the MIP images confirms the above findings CTA HEAD FINDINGS Anterior circulation: Both internal carotid arteries are patent through the skull base and siphon regions. No siphon stenosis. The anterior and middle cerebral vessels are patent. No large vessel occlusion or correctable proximal stenosis. No aneurysm or vascular malformation. Posterior circulation: Both vertebral arteries are widely patent to the basilar. No basilar stenosis. Posterior circulation branch vessels are normal. Venous sinuses: Patent and normal. Anatomic variants: None significant. Review of the MIP images confirms the above findings IMPRESSION: No intracranial large vessel occlusion. Atherosclerotic disease at both carotid bifurcation and ICA bulb regions. 40% stenosis in the right ICA bulb. 20% stenosis in the left ICA bulb. No acute head CT finding. These results were called by telephone at the time of interpretation on 09/12/2020 at 3:16 pm to provider MATTHEW TRIFAN , who verbally acknowledged these results. Electronically Signed   By: MNelson ChimesM.D.   On: 09/12/2020 15:18   CT ANGIO NECK CODE STROKE  Result Date: 09/12/2020 CLINICAL DATA:  Stroke. TIA. Assess extracranial arteries. Sudden onset of left arm numbness 1 hour ago. Left leg heaviness. EXAM: CT ANGIOGRAPHY HEAD AND NECK TECHNIQUE: Multidetector CT imaging of the head and neck was performed using the standard protocol during bolus administration of intravenous contrast. Multiplanar CT image reconstructions and MIPs were obtained to evaluate the vascular anatomy. Carotid stenosis measurements (when applicable) are obtained utilizing NASCET criteria, using the distal internal carotid diameter as the denominator.  CONTRAST:  1023mOMNIPAQUE IOHEXOL 350 MG/ML SOLN COMPARISON:  Head CT 07/14/2018 FINDINGS: CT HEAD  FINDINGS Brain: No sign of acute infarction. No focal abnormality affects the brainstem or cerebellum. Old white matter infarction in the right frontal lobe. No mass, hemorrhage, hydrocephalus or extra-axial collection. Vascular: No acute vascular finding. Skull: Negative Sinuses: Clear Orbits: Normal Review of the MIP images confirms the above findings CTA NECK FINDINGS Aortic arch: Aortic atherosclerosis.  Branching pattern is normal. Right carotid system: Common carotid artery is widely patent to the bifurcation. Soft and calcified plaque at the carotid bifurcation and ICA bulb. Minimal diameter of the ICA bulb measures 3 mm. Compared to a more distal cervical ICA diameter of 5 mm, this indicates a 40% stenosis. Left carotid system: Common carotid artery widely patent to the bifurcation. Soft and calcified plaque at the carotid bifurcation and ICA bulb. Minimal diameter of the ICA bulb measures 4 mm. Compared to a more distal cervical ICA diameter of 5 mm, this indicates a 20% stenosis. Vertebral arteries: Both vertebral artery origins are widely patent. Both vertebral arteries appear normal through the cervical region to the foramen magnum Skeleton: Minimal cervical spondylosis. Other neck: No mass or lymphadenopathy.  Normal Upper chest: Normal Review of the MIP images confirms the above findings CTA HEAD FINDINGS Anterior circulation: Both internal carotid arteries are patent through the skull base and siphon regions. No siphon stenosis. The anterior and middle cerebral vessels are patent. No large vessel occlusion or correctable proximal stenosis. No aneurysm or vascular malformation. Posterior circulation: Both vertebral arteries are widely patent to the basilar. No basilar stenosis. Posterior circulation branch vessels are normal. Venous sinuses: Patent and normal. Anatomic variants: None significant. Review  of the MIP images confirms the above findings IMPRESSION: No intracranial large vessel occlusion. Atherosclerotic disease at both carotid bifurcation and ICA bulb regions. 40% stenosis in the right ICA bulb. 20% stenosis in the left ICA bulb. No acute head CT finding. These results were called by telephone at the time of interpretation on 09/12/2020 at 3:16 pm to provider MATTHEW TRIFAN , who verbally acknowledged these results. Electronically Signed   By: Nelson Chimes M.D.   On: 09/12/2020 15:18     Labs:   Basic Metabolic Panel: Recent Labs  Lab 09/12/20 1446  NA 139  K 3.8  CL 103  CO2 26  GLUCOSE 195*  BUN 15  CREATININE 0.86  CALCIUM 9.1   GFR Estimated Creatinine Clearance: 67 mL/min (by C-G formula based on SCr of 0.86 mg/dL). Liver Function Tests: Recent Labs  Lab 09/12/20 1446  AST 23  ALT 25  ALKPHOS 84  BILITOT 0.5  PROT 7.9  ALBUMIN 4.8   No results for input(s): LIPASE, AMYLASE in the last 168 hours. No results for input(s): AMMONIA in the last 168 hours. Coagulation profile Recent Labs  Lab 09/12/20 1446  INR 0.9    CBC: Recent Labs  Lab 09/12/20 1446  WBC 6.7  NEUTROABS 4.2  HGB 15.0  HCT 43.4  MCV 88.2  PLT 213   Cardiac Enzymes: No results for input(s): CKTOTAL, CKMB, CKMBINDEX, TROPONINI in the last 168 hours. BNP: Invalid input(s): POCBNP CBG: Recent Labs  Lab 09/12/20 1505 09/12/20 2111 09/12/20 2150 09/13/20 0622 09/13/20 1153  GLUCAP 188* 116* 102* 152* 150*   D-Dimer No results for input(s): DDIMER in the last 72 hours. Hgb A1c Recent Labs    09/13/20 0455  HGBA1C 7.9*   Lipid Profile Recent Labs    09/13/20 0455  CHOL 209*  HDL 34*  LDLCALC 134*  TRIG 203*  CHOLHDL 6.1  Thyroid function studies No results for input(s): TSH, T4TOTAL, T3FREE, THYROIDAB in the last 72 hours.  Invalid input(s): FREET3 Anemia work up No results for input(s): VITAMINB12, FOLATE, FERRITIN, TIBC, IRON, RETICCTPCT in the last 72  hours. Microbiology Recent Results (from the past 240 hour(s))  Resp Panel by RT-PCR (Flu A&B, Covid) Urine, Clean Catch     Status: None   Collection Time: 09/12/20  3:30 PM   Specimen: Urine, Clean Catch; Nasopharyngeal(NP) swabs in vial transport medium  Result Value Ref Range Status   SARS Coronavirus 2 by RT PCR NEGATIVE NEGATIVE Final    Comment: (NOTE) SARS-CoV-2 target nucleic acids are NOT DETECTED.  The SARS-CoV-2 RNA is generally detectable in upper respiratory specimens during the acute phase of infection. The lowest concentration of SARS-CoV-2 viral copies this assay can detect is 138 copies/mL. A negative result does not preclude SARS-Cov-2 infection and should not be used as the sole basis for treatment or other patient management decisions. A negative result may occur with  improper specimen collection/handling, submission of specimen other than nasopharyngeal swab, presence of viral mutation(s) within the areas targeted by this assay, and inadequate number of viral copies(<138 copies/mL). A negative result must be combined with clinical observations, patient history, and epidemiological information. The expected result is Negative.  Fact Sheet for Patients:  EntrepreneurPulse.com.au  Fact Sheet for Healthcare Providers:  IncredibleEmployment.be  This test is no t yet approved or cleared by the [place] FDA and  has been authorized for detection and/or diagnosis of SARS-CoV-2 by FDA under an Emergency Use Authorization (EUA). This EUA will remain  in effect (meaning this test can be used) for the duration of the COVID-19 declaration under Section 564(b)(1) of the Act, 21 U.S.C.section 360bbb-3(b)(1), unless the authorization is terminated  or revoked sooner.       Influenza A by PCR NEGATIVE NEGATIVE Final   Influenza B by PCR NEGATIVE NEGATIVE Final    Comment: (NOTE) The Xpert Xpress SARS-CoV-2/FLU/RSV plus assay is  intended as an aid in the diagnosis of influenza from Nasopharyngeal swab specimens and should not be used as a sole basis for treatment. Nasal washings and aspirates are unacceptable for Xpert Xpress SARS-CoV-2/FLU/RSV testing.  Fact Sheet for Patients: EntrepreneurPulse.com.au  Fact Sheet for Healthcare Providers: IncredibleEmployment.be  This test is not yet approved or cleared by the [place] FDA and has been authorized for detection and/or diagnosis of SARS-CoV-2 by FDA under an Emergency Use Authorization (EUA). This EUA will remain in effect (meaning this test can be used) for the duration of the COVID-19 declaration under Section 564(b)(1) of the Act, 21 U.S.C. section 360bbb-3(b)(1), unless the authorization is terminated or revoked.  Performed at [facility], [address], [place], [place] [address]      Discharge Instructions:   Discharge Instructions     Ambulatory referral to Neurology   Complete by: As directed    An appointment is requested in approximately: 4 weeks   Diet - low sodium heart healthy   Complete by: As directed    Discharge instructions   Complete by: As directed    Please doHas been changed to Crestor.  Follow-up with your primary care physician in 1 week.  Follow-up with [facility] as scheduled by the clinic.  Discussed with your primary care physician about better cholesterol control.   Increase activity slowly   Complete by: As directed       Allergies as of 09/13/2020  Reactions   Augmentin [amoxicillin-pot Clavulanate] Other (See Comments)   Altered mental status Did it involve swelling of the face/tongue/throat, SOB, or low BP? Unknown Did it involve sudden or severe rash/hives, skin peeling, or any reaction on the inside of your mouth or nose? Unknown Did you need to seek medical attention at a hospital or doctor's office? Unknown When did it last  happen? unk       If all above answers are "NO", may proceed with cephalosporin use.   Definity [perflutren Lipid Microsphere] Other (See Comments)   Headache and facial flushing after Definity IVP given. No SOB or Chest pain,and Vital signs stable. Irven Baltimore, RN.   Novocain [procaine] Other (See Comments)   Altered mental status   Promethazine Swelling   Tongue swelling        Medication List     STOP taking these medications    atorvastatin 80 MG tablet Commonly known as: LIPITOR       TAKE these medications    Accu-Chek Guide test strip Generic drug: glucose blood Use as instructed to test blood sugars 3 times daily E11.65   accu-chek multiclix lancets Use as instructed to test blood sugars 3 times daily E11.65   aspirin 81 MG EC tablet Take 1 tablet (81 mg total) by mouth daily.   BD Pen Needle Nano 2nd Gen 32G X 4 MM Misc Generic drug: Insulin Pen Needle USE DAILY AS DIRECTED   clopidogrel 75 MG tablet Commonly known as: PLAVIX Take 1 tablet (75 mg total) by mouth daily for 21 days. Start taking on: September 14, 2020   insulin detemir 100 UNIT/ML FlexPen Commonly known as: LEVEMIR Inject 20 Units into the skin daily.   metFORMIN 1000 MG tablet Commonly known as: GLUCOPHAGE Take 1 tablet (1,000 mg total) by mouth 2 (two) times daily with a meal.   rosuvastatin 40 MG tablet Commonly known as: CRESTOR Take 1 tablet (40 mg total) by mouth at bedtime.   topiramate 50 MG tablet Commonly known as: TOPAMAX Take 1 tablet (50 mg total) by mouth at bedtime.               Durable Medical Equipment  (From admission, onward)           Start     Ordered   09/13/20 1350  For home use only DME Walker rolling  Once       Question Answer Comment  Walker: With [place]   Patient needs a walker to treat with the following condition Stroke [facility])      09/13/20 1350              Time coordinating discharge: 39 minutes  Signed:  Alakai Macbride  [facility] 09/13/2020, 3:11 PM

## 2020-09-13 NOTE — TOC Transition Note (Signed)
Transition of Care [facility]) - CM/SW Discharge Note   Patient Details  Name: Jacqueline [name] MRN: [id number] Date of Birth: 1950/08/25  Transition of Care [facility]) CM/SW Contact:  Pollie Friar, RN Phone Number: 09/13/2020, 3:21 PM   Clinical Narrative:    Patient discharging home with [facility] services through [facility]. Information on the AVS.  Rolling walker for home to be delivered to the room per [employer]. Pt has supervision at home and transportation to home.   Final next level of care: Home w Home Health Services Barriers to Discharge: No Barriers Identified   Patient Goals and CMS Choice   CMS Medicare.gov Compare Post Acute Care list provided to:: Patient Choice offered to / list presented to : Patient  Discharge Placement                       Discharge Plan and Services                DME Arranged: Walker rolling DME Agency: [employer] Date DME Agency Contacted: 09/13/20   Representative spoke with at DME Agency: Freda Munro HH Arranged: PT, OT [facility] Agency: [place] Date [place]: 09/13/20   Representative spoke with at [place]: [place] ([place]) Interventions     Readmission Risk Interventions No flowsheet data found.

## 2020-09-13 NOTE — Progress Notes (Signed)
  Echocardiogram 2D Echocardiogram has been performed.  Fidel Levy 09/13/2020, 10:49 AM

## 2020-09-13 NOTE — Progress Notes (Signed)
Carotid duplex has been completed.   Preliminary results in CV Proc.   Abram Sander 09/13/2020 10:19 AM

## 2020-09-13 NOTE — Evaluation (Signed)
Occupational Therapy Evaluation Patient Details Name: Jacqueline [name] MRN: [id number] DOB: 1950/09/15 Today's Date: 09/13/2020    History of Present Illness 70 y.o. female presents to [facility] hospital as a [place] 09[place]12/2020 with L-sided numbness and tingling. CT head and CTA negative. MRI demonstrates Punctate foci of acute ischemia within the medial right frontal  lobe. PMH includes hypertension, hyperlipidemia, insulin-dependent type II diabetes, TIA versus complex migraine, obesity.   Clinical Impression   Patient admitted for the diagnosis above.  PTA she lives with her spouse, who can provide assist as needed.  Patient endorse a history of falls, but is otherwise independent with ADL/IADL.  Barriers are listed below.  Currently she is needing supervision for lower body ADL and for in room mobility/toileting.      Follow Up Recommendations  No OT follow up    Equipment Recommendations  Tub/shower seat;3 in 1 bedside commode    Recommendations for Other Services       Precautions / Restrictions Precautions Precautions: Fall Restrictions Weight Bearing Restrictions: No      Mobility Bed Mobility Overal bed mobility: Independent               Patient Response: Cooperative  Transfers Overall transfer level: Needs assistance   Transfers: Sit to/from Stand;Stand Pivot Transfers Sit to Stand: Supervision Stand pivot transfers: Min guard            Balance Overall balance assessment: Needs assistance Sitting-balance support: No upper extremity supported;Feet supported Sitting balance-Leahy Scale: Good     Standing balance support: Single extremity supported Standing balance-Leahy Scale: Poor Standing balance comment: reliant on UE support                           ADL either performed or assessed with clinical judgement   ADL Overall ADL's : Needs assistance/impaired                     Lower Body Dressing:  Supervision/safety;Sit to/from stand   Toilet Transfer: Magazine features editor Details (indicate cue type and reason): HHA Toileting- Clothing Manipulation and Hygiene: Supervision/safety;Sit to/from stand       Functional mobility during ADLs: Min guard       Vision Patient Visual Report: No change from baseline       Perception     Praxis      Pertinent Vitals/Pain Pain Assessment: Faces Faces Pain Scale: Hurts a little bit Pain Location: low back Pain Descriptors / Indicators: Aching Pain Intervention(s): Monitored during session     Hand Dominance Right   Extremity/Trunk Assessment Upper Extremity Assessment Upper Extremity Assessment: Overall WFL for tasks assessed   Lower Extremity Assessment Lower Extremity Assessment: Defer to PT evaluation   Cervical / Trunk Assessment Cervical / Trunk Assessment: Normal   Communication Communication Communication: No difficulties   Cognition Arousal/Alertness: Awake/alert Behavior During Therapy: WFL for tasks assessed/performed Overall Cognitive Status: Within Functional Limits for tasks assessed                                                      Home Living Family/patient expects to be discharged to:: Private residence Living Arrangements: Spouse/significant other;Children Available Help at Discharge: Family;Available 24 hours/day Type of Home: House Home Access: Level entry  Home Layout: Multi-level;Laundry or work area in basement     [employer] Shower/Tub: Teacher, early years/pre: [employer]: None          Prior Functioning/Environment Level of Independence: Independent                 OT Problem List: Decreased strength;Impaired balance (sitting and/or standing)      OT Treatment/Interventions: Self-care/ADL training;Therapeutic activities;Balance training    OT Goals(Current goals can be found in the care plan section) Acute  Rehab OT Goals Patient Stated Goal: return home and recover OT Goal Formulation: With patient Time For Goal Achievement: 09/27/20 Potential to Achieve Goals: Good ADL Goals Pt Will Perform Lower Body Bathing: with modified independence;sit to/from stand Pt Will Perform Lower Body Dressing: with modified independence;sit to/from stand Pt Will Transfer to Toilet: with modified independence;ambulating;regular height toilet Pt Will Perform Toileting - Clothing Manipulation and hygiene: sit to/from stand;with modified independence  OT Frequency: Min 2X/week   Barriers to D/C:    none noted       Co-evaluation              AM-PAC OT "6 Clicks" Daily Activity     Outcome Measure Help from another person eating meals?: None Help from another person taking care of personal grooming?: None Help from another person toileting, which includes using toliet, bedpan, or urinal?: A Little Help from another person bathing (including washing, rinsing, drying)?: A Little Help from another person to put on and taking off regular upper body clothing?: None Help from another person to put on and taking off regular lower body clothing?: A Little 6 Click Score: 21   End of Session    Activity Tolerance: Patient tolerated treatment well Patient left: in bed;with call bell/phone within reach;with family/visitor present  OT Visit Diagnosis: Unsteadiness on feet (R26.81)                Time: [id number] OT Time Calculation (min): 14 min Charges:  OT General Charges $OT Visit: 1 Visit OT Evaluation $OT Eval Moderate Complexity: 1 Mod  09/13/2020  Rich, OTR/L  Acute Rehabilitation Services  Office:  [phone]   Jacqueline [name] 09/13/2020, 1:34 PM

## 2020-09-17 ENCOUNTER — Telehealth: Payer: Self-pay

## 2020-09-17 NOTE — Telephone Encounter (Signed)
Transition Care Management Follow-up Telephone Call Date of discharge and from where: 09/13/2020 -[facility] How have you been since you were released from the hospital? Doing ok. Left leg still feels numb Any questions or concerns? No  Items Reviewed: Did the pt receive and understand the discharge instructions provided? Yes  Medications obtained and verified? Yes  Other? Yes  Any new allergies since your discharge? No  Dietary orders reviewed? No Do you have support at home? Yes   Home Care and Equipment/Supplies: Were home health services ordered? yes If so, what is the name of the agency? [facility]  Has the agency set up a time to come to the patient's home? They have called but patient postponed Were any new equipment or medical supplies ordered?  Yes: walker What is the name of the medical supply agency? Obtained from hospital Were you able to get the supplies/equipment? yes Do you have any questions related to the use of the equipment or supplies? No  Functional Questionnaire: (I = Independent and D = Dependent) ADLs: I WITH ASSISTANCE  Bathing/Dressing- I  Meal Prep- I WITH ASSISTANCE  Eating- I  Maintaining continence- I  Transferring/Ambulation- I WITH WALKER  Managing Meds- I  Follow up appointments reviewed:  PCP Hospital f/u appt confirmed? No  Patient needs to talk with her husband & then she will call back to schedule appt [facility] f/u appt confirmed? Yes  Scheduled to see [facility] on 10/16/2020 @ time unknown. Are transportation arrangements needed? No  If their condition worsens, is the pt aware to call PCP or go to the Emergency Dept.? Yes Was the patient provided with contact information for the PCP's office or ED? Yes Was to pt encouraged to call back with questions or concerns? Yes

## 2020-09-18 ENCOUNTER — Ambulatory Visit (INDEPENDENT_AMBULATORY_CARE_PROVIDER_SITE_OTHER): Payer: Medicare HMO | Admitting: Medical

## 2020-09-18 ENCOUNTER — Other Ambulatory Visit: Payer: Self-pay

## 2020-09-18 ENCOUNTER — Encounter: Payer: Self-pay | Admitting: Medical

## 2020-09-18 VITALS — BP 140/70 | HR 77 | Temp 98.4°F | Resp 18 | Ht 65.0 in | Wt 192.0 lb

## 2020-09-18 DIAGNOSIS — G459 Transient cerebral ischemic attack, unspecified: Secondary | ICD-10-CM | POA: Diagnosis not present

## 2020-09-18 DIAGNOSIS — R0789 Other chest pain: Secondary | ICD-10-CM

## 2020-09-18 DIAGNOSIS — R252 Cramp and spasm: Secondary | ICD-10-CM | POA: Diagnosis not present

## 2020-09-18 DIAGNOSIS — E785 Hyperlipidemia, unspecified: Secondary | ICD-10-CM

## 2020-09-18 DIAGNOSIS — E1159 Type 2 diabetes mellitus with other circulatory complications: Secondary | ICD-10-CM | POA: Diagnosis not present

## 2020-09-18 DIAGNOSIS — I10 Essential (primary) hypertension: Secondary | ICD-10-CM | POA: Diagnosis not present

## 2020-09-18 DIAGNOSIS — R531 Weakness: Secondary | ICD-10-CM

## 2020-09-18 LAB — TROPONIN I: Troponin I: <3 ng/L (ref <=47)

## 2020-09-18 MED ORDER — LOSARTAN POTASSIUM 25 MG PO TABS: 25.0000 mg | ORAL_TABLET | Freq: Every day | ORAL | 0 refills | Status: DC

## 2020-09-18 NOTE — Progress Notes (Signed)
Subjective:    Patient ID: Jacqueline [name], female    DOB: 03/12/1950, 70 y.o.   MRN: [id number]  HPI  Pt in for some rt side lower rib area. She states pain is worse at night. She states she had xray to evaluate if she had fracture. No preceding trauma.   Pain usually with movement. At night when lays on her side has to move. No mid spinal pain. Pt states if she has voltaren gel rubbed in area pain will decrease.   Has also used 400 mg ibuprofen would help some.(Explained should not take)  At time if can keep pain away by not moving thorax.   IMPRESSION: 1. No acute cardiopulmonary process. 2. No displaced or obvious rib fractures.   Pt also was hospitalized recently    Admit date: 09/12/2020 Discharge date: 09/13/2020  Recommendations for Outpatient Follow-Up:    Follow up with your primary care provider in 1-2 weeks. Check CBC, BMP, magnesium in the next visit. Follow-up with [facility] as scheduled by the clinic. Patient was on Lipitor 80 mg but her cholesterol is a still uncontrolled.  Patient might benefit from PCSK9 inhibitors as outpatient.  At this time, this has been changed to Crestor '40mg'$ . She would benefit from better glycemic control as outpatient.   Discharge Diagnosis:    Principal Problem:   Acute CVA (cerebrovascular accident) ([place]) Active Problems:   HLD (hyperlipidemia)   DM type 2 (diabetes mellitus, type 2) ([place])   HTN (hypertension)    Discharge Condition: Improved.    History of Present Illness:    Jacqueline [name] is a 70 y.o. female with medical history significant of hypertension, hyperlipidemia, insulin-dependent type II diabetes, TIA versus complex migraine, obesity presented to the ED with complaints of left-sided tingling and numbness.  Code stroke was activated.  CT head was negative for acute finding.  CTA showing no intracranial LVO.  Symptoms rapidly improved. TPA not given as teleneurology felt that the symptoms were  likely due to complex migraine versus electrographic disturbance.  Recommended hospital admission for further work-up.  Hospital Course:    Following conditions were addressed during hospitalization as listed below,   Acute onset left-sided tingling, numbness, weakness due to acute CVA. CT head negative for acute finding.  CTA showing no intracranial LVO.  Symptoms rapidly improved. tPA not given. MRI brain with and without contrast punctuate foci of acute ischemia within the medial right frontal lobe.  Patient will be continued on aspirin and Plavix.  Crestor will be prescribed instead of Lipitor due to elevated cholesterol.  Patient was advised to discuss with her primary care physician regarding possible transition to PCSK9 inhibitors as outpatient.  Seen by neurology prior to discharge.  Stable for disposition home with outpatient neurology follow-up  Right-sided lateral rib pain No obvious bruising noted on exam.    No acute issues at this time.  X-ray of the ribs without any acute process.  Treatment.   Hypertension As per history.  Not on medications at home.  Could start the medications as outpatient.  Currently on permissive hypertension window for   Hyperlipidemia On Lipitor 80 mg at home but cholesterol 209 and LDL cholesterol 134.  Patient might benefit from changing to PCSK9 inhibitors.  We will however change to Crestor '40mg'$  at this time on discharge.  Spoke with the patient about it.   Insulin-dependent type 2 diabetes A1c 7.2 on 04/02/2020.  A1c of 7.9 at this time.  Patient will  be resumed on home insulin regimen on discharge.  Follow-up with primary care physician as outpatient.   Disposition.  At this time, patient is stable for disposition home with outpatient PCP and neurology follow-up.  Pt feels like her left upper ext strength back to normal.  She feels like her left leg is almost back to normal.  Pt feels balance little bit off.  Mri showed.  IMPRESSION: 1.  Punctate foci of acute ischemia within the medial right frontal lobe. No hemorrhage or mass effect. 2. Multiple old small vessel infarcts of the right corona radiata.   Pt also mentioned some transient low level chest pain lat night maybe 5 minutes. No reoccurence and no assocaited type signs or symptoms.  Review of Systems  Constitutional:  Negative for chills, fatigue and fever.  HENT:  Negative for congestion and drooling.   Respiratory:  Negative for cough, chest tightness, shortness of breath and wheezing.   Cardiovascular:  Negative for chest pain and palpitations.  Gastrointestinal:  Negative for abdominal pain.  Genitourinary:  Negative for difficulty urinating, dysuria and enuresis.  Musculoskeletal:  Negative for back pain.       Rt rib pain.   Skin:  Negative for rash.  Neurological:  Negative for dizziness, seizures, light-headedness, numbness and headaches.  Hematological:  Negative for adenopathy. Does not bruise/bleed easily.  Psychiatric/Behavioral:  Negative for behavioral problems and decreased concentration.    Past Medical History:  Diagnosis Date   Diabetes mellitus    HLD (hyperlipidemia)    Hypertension    Nephrolithiasis    Obese    TIA (transient ischemic attack) 2020     Social History   Socioeconomic History   Marital status: Married    Spouse name: Not on file   Number of children: 4   Years of education: Not on file   Highest education level: Not on file  Occupational History   Occupation: Retired-Missionary  Tobacco Use   Smoking status: Never   Smokeless tobacco: Never  Vaping Use   Vaping Use: Never used  Substance and Sexual Activity   Alcohol use: No   Drug use: No   Sexual activity: Yes    Partners: Male    Birth control/protection: Post-menopausal  Other Topics Concern   Not on file  Social History Narrative   Retired Media planner   Husband is a Theme park manager- does some missions in [place]   2 great grandchildren (live in  [place])   Married 80 years   4 children- 1 [place], 1  [place], 1 in [place], 1 at home   8 granchildren   Enjoys crafts   No pets   Social Determinants of Radio broadcast assistant Strain: Not on file  Food Insecurity: Not on file  Transportation Needs: Not on file  Physical Activity: Not on file  Stress: Not on file  Social Connections: Not on file  Intimate Partner Violence: Not on file    Past Surgical History:  Procedure Laterality Date   ROTATOR CUFF REPAIR  2009   [place]    Family History  Problem Relation Age of Onset   Diabetes Father    Heart attack Father 56   Heart disease Mother        hx cabg at 37   Diabetes type II Brother    Parkinson's disease Sister    Brain cancer Sister    Pancreatic cancer Brother    Diabetes Mellitus II Sister  Diabetes Mellitus II Sister    CAD Sister    Diabetes Mellitus II Sister    Diabetes Mellitus II Sister    Osteoarthritis Daughter    AAA (abdominal aortic aneurysm) Daughter     Allergies  Allergen Reactions   Augmentin [Amoxicillin-Pot Clavulanate] Other (See Comments)    Altered mental status Did it involve swelling of the face/tongue/throat, SOB, or low BP? Unknown Did it involve sudden or severe rash/hives, skin peeling, or any reaction on the inside of your mouth or nose? Unknown Did you need to seek medical attention at a hospital or doctor's office? Unknown When did it last happen? unk       If all above answers are "NO", may proceed with cephalosporin use.    Definity [Perflutren Lipid Microsphere] Other (See Comments)    Headache and facial flushing after Definity IVP given. No SOB or Chest pain,and Vital signs stable. Irven Baltimore, RN.   Novocain [Procaine] Other (See Comments)    Altered mental status   Promethazine Swelling    Tongue swelling    Current Outpatient Medications on File Prior to Visit  Medication Sig Dispense Refill   aspirin EC 81 MG EC tablet Take 1  tablet (81 mg total) by mouth daily. 21 tablet 0   BD PEN NEEDLE NANO 2ND GEN 32G X 4 MM MISC USE DAILY AS DIRECTED 100 each 2   clopidogrel (PLAVIX) 75 MG tablet Take 1 tablet (75 mg total) by mouth daily for 21 days. 21 tablet 0   glucose blood (ACCU-CHEK GUIDE) test strip Use as instructed to test blood sugars 3 times daily E11.65 100 each 12   insulin detemir (LEVEMIR) 100 UNIT/ML FlexPen Inject 20 Units into the skin daily. 30 mL 3   Lancets (ACCU-CHEK MULTICLIX) lancets Use as instructed to test blood sugars 3 times daily E11.65 100 each 12   metFORMIN (GLUCOPHAGE) 1000 MG tablet Take 1 tablet (1,000 mg total) by mouth 2 (two) times daily with a meal. 180 tablet 3   rosuvastatin (CRESTOR) 40 MG tablet Take 1 tablet (40 mg total) by mouth at bedtime. 90 tablet 2   topiramate (TOPAMAX) 50 MG tablet Take 1 tablet (50 mg total) by mouth at bedtime. 90 tablet 3   No current facility-administered medications on file prior to visit.    BP (!) 149/66   Pulse 77   Temp 98.4 F (36.9 C)   Resp 18   Ht '5\' 5"'$  (1.651 m)   Wt 192 lb (87.1 kg)   SpO2 95%   BMI 31.95 kg/m        Objective:   Physical Exam  General Mental Status- Alert. General Appearance- Not in acute distress.   Skin General: Color- Normal Color. Moisture- Normal Moisture.  Neck Carotid Arteries- Normal color. Moisture- Normal Moisture. No carotid bruits. No JVD.  Chest and Lung Exam Auscultation: Breath Sounds:-Normal.  Cardiovascular Auscultation:Rythm- Regular. Murmurs & Other Heart Sounds:Auscultation of the heart reveals- No Murmurs.  Abdomen Inspection:-Inspeection Normal. Palpation/Percussion:Note:No mass. Palpation and Percussion of the abdomen reveal- Non Tender, Non Distended + BS, no rebound or guarding.    Neurologic Cranial Nerve exam:- CN III-XII intact(No nystagmus), symmetric smile. Drift Test:- No drift. Romberg Exam:- Negative.  Heal to Toe Gait exam:-Normal. Finger to Nose:-  Normal/Intact Strength:- 5/5 ext streght rt side and lue.  Minimal slight weakness left lower ext 4/5. Very close to normal.    A/P    History of TIA with good neurologic exam today  and describing almost regaining complete function of left side extremities.  Left upper extremity 5 out of 5 strength on exam.  And left lower extremity seems very close to 5 out of 5.  Some described balance issues.  I do recommend that you except physical therapy.  Your blood pressure is borderline elevated today.  On discharge summary they noted permissive slight elevated BP.  At this point think best to give low-dose losartan in light of your diabetes history.  Getting posthospitalization labs to include magnesium, CBC and CMP.  Atypical chest pain for 5 minutes last night.  Describes minimal pain with no associated cardiac type signs and symptoms.  Your EKG showed sinus rhythm with nonspecific T abnormality.  We will get 1 set of stat troponin.  If you have any recurrent chest pain then very important that you be evaluated emergency department.  If he has any troponin elevation we will let you know and advise ED evaluation as well.  Referral to cardiologist placed to get opinion on possible PK S9 inhibitor in light of cholesterol ovation despite use of high-dose epidural.  History of diabetes continue current meds prescribed by endocrinologist.  Follow-up in 10 to 14 days with PCP or sooner if needed.  Time spent with patient today was 45  minutes which consisted of chart review, discussing diagnosis, work up treatment and documentation.   Mackie Pai, PA-C

## 2020-09-18 NOTE — Patient Instructions (Addendum)
History of TIA with good neurologic exam today and describing almost regaining complete function of left side extremities.  Left upper extremity 5 out of 5 strength on exam.  And left lower extremity seems very close to 5 out of 5.  Some described balance issues.  I do recommend that you except physical therapy. Continue plavix and aspirin  Your blood pressure is borderline elevated today.  On discharge summary they noted permissive slight elevated BP.  At this point think best to give low-dose losartan in light of your diabetes history.  Getting posthospitalization labs to include magnesium, CBC and CMP.  Atypical chest pain for 5 minutes last night.  Describes minimal pain with no associated cardiac type signs and symptoms.  Your EKG showed sinus rhythm with nonspecific T abnormality.  We will get 1 set of stat troponin.  If you have any recurrent chest pain then very important that you be evaluated emergency department.  If he has any troponin elevation we will let you know and advise ED evaluation as well.  Referral to cardiologist placed to get opinion on possible PK S9 inhibitor in light of cholesterol ovation despite use of high-dose epidural.  History of diabetes continue current meds prescribed by endocrinologist.  For rib pain can use salon pas or voltaren. If pain persists refer to sports med.  Follow-up in 10 to 14 days with PCP or sooner if needed.

## 2020-09-19 ENCOUNTER — Telehealth: Payer: Self-pay | Admitting: Internal Medicine

## 2020-09-19 ENCOUNTER — Telehealth: Payer: Self-pay | Admitting: Medical

## 2020-09-19 LAB — CBC WITH DIFFERENTIAL/PLATELET
Basophils Absolute: 0.1 10*3/uL (ref 0.0–0.1)
Basophils Relative: 0.9 % (ref 0.0–3.0)
Eosinophils Absolute: 0.2 10*3/uL (ref 0.0–0.7)
Eosinophils Relative: 2.3 % (ref 0.0–5.0)
HCT: 42.4 % (ref 36.0–46.0)
Hemoglobin: 14.2 g/dL (ref 12.0–15.0)
Lymphocytes Relative: 22.2 % (ref 12.0–46.0)
Lymphs Abs: 1.6 10*3/uL (ref 0.7–4.0)
MCHC: 33.6 g/dL (ref 30.0–36.0)
MCV: 88.2 fl (ref 78.0–100.0)
Monocytes Absolute: 0.6 10*3/uL (ref 0.1–1.0)
Monocytes Relative: 8.3 % (ref 3.0–12.0)
Neutro Abs: 4.7 10*3/uL (ref 1.4–7.7)
Neutrophils Relative %: 66.3 % (ref 43.0–77.0)
Platelets: 196 10*3/uL (ref 150.0–400.0)
RBC: 4.8 Mil/uL (ref 3.87–5.11)
RDW: 12.8 % (ref 11.5–15.5)
WBC: 7.1 10*3/uL (ref 4.0–10.5)

## 2020-09-19 LAB — COMPREHENSIVE METABOLIC PANEL
ALT: 18 U/L (ref 0–35)
AST: 16 U/L (ref 0–37)
Albumin: 4.6 g/dL (ref 3.5–5.2)
Alkaline Phosphatase: 84 U/L (ref 39–117)
BUN: 14 mg/dL (ref 6–23)
CO2: 27 mEq/L (ref 19–32)
Calcium: 10 mg/dL (ref 8.4–10.5)
Chloride: 104 mEq/L (ref 96–112)
Creatinine, Ser: 0.92 mg/dL (ref 0.40–1.20)
GFR: 62.72 mL/min (ref >60.00)
Glucose, Bld: 149 mg/dL — ABNORMAL HIGH (ref 70–99)
Potassium: 5.3 mEq/L — ABNORMAL HIGH (ref 3.5–5.1)
Sodium: 141 mEq/L (ref 135–145)
Total Bilirubin: 0.5 mg/dL (ref 0.2–1.2)
Total Protein: 7.2 g/dL (ref 6.0–8.3)

## 2020-09-19 LAB — MAGNESIUM: Magnesium: 2 mg/dL (ref 1.5–2.5)

## 2020-09-19 MED ORDER — SODIUM POLYSTYRENE SULFONATE 15 GM/60ML PO SUSP
ORAL | 0 refills | Status: DC
Start: 2020-09-19 — End: 2020-11-07

## 2020-09-19 MED ORDER — SODIUM POLYSTYRENE SULFONATE 15 GM/60ML PO SUSP: ORAL | 0 refills | Status: DC

## 2020-09-19 NOTE — Telephone Encounter (Signed)
Called and spoke with patient she said that she had follow up with Pcp on yesterday, and wanted to know if  she needed change any of  her medication. I asked pt if she has any changes in her Blood sugar, pt said that she had one reading of  201 and she really could say what her reading were back she just got a new meter and has just got out the hospital. Per the note from her PCP she is to stay on the same dose of  Diabetes medication per endocrinology. Ask pt to check her blood sugar and send [place] an message of what her blood sugar and so I can put this up for the on call provider, pt said that she is taking Metformin BID and insulin no changes. Pt is going send a message regarding blood sugars.

## 2020-09-19 NOTE — Telephone Encounter (Signed)
Pt had a stroke 09/12/2020 pt voiced that hospital changed medications around and pt is wondering how Dr.Shamleffer wants her to take it.  She is also stating that she is having tingling in her toes in both feet. Pt would like a call back regarding this as well as she is wanting an app as soon as possible.

## 2020-09-19 NOTE — Telephone Encounter (Signed)
Kayexalate sent to pt pharmacy.

## 2020-09-20 ENCOUNTER — Telehealth: Payer: Self-pay

## 2020-09-20 ENCOUNTER — Encounter: Payer: Self-pay | Admitting: Medical

## 2020-09-20 NOTE — Telephone Encounter (Signed)
Called and spoke w/ pt explain in detailed to take 20 to 25 units of levmir , and check her blood sugar after meals per Dr Dwyane Dee. And follow up with Dr. Kelton Pillar on Monday. Asked pt to write this information down and to call us back if she any other concerns. Pt understood with any concerns.

## 2020-09-20 NOTE — Telephone Encounter (Signed)
Spoke w/ pt on yesterday she said that she had one  reading of 201 pt has not been checking her sugars  because she  said just  got out of the hospital and said follow up with PCP on yesterday and was told to keep taking her dose of medication  given by [facility]. I ask pt check her blood sugar to call us back with her reading Pt called back today and said that she is having numbness in her feet  this as been going on for a 1 week and she said something to PCP but it was not address. Pt was given appt  follow up  for Monday w/ dr Chauncey Cruel.

## 2020-09-23 NOTE — Progress Notes (Signed)
Name: Jacqueline [name]  Age/ Sex: 70 y.o., female   MRN/ DOB: [id number], October 12, 1950     PCP: [name], NP   Reason for Endocrinology Evaluation: Type 2 Diabetes Mellitus  Initial Endocrine Consultative Visit: 10/04/2018    PATIENT IDENTIFIER: Jacqueline [name] is a 70 y.o. female with a past medical history of HTN, DM, HLD and migraine headaches. The patient has followed with Endocrinology clinic since 10/04/2018 for consultative assistance with management of her diabetes.  DIABETIC HISTORY:  Jacqueline [name] was diagnosed with DM many years ago, she has been on metformin and glipizide for years, insulin started 07/2018. She was prescribed Victoza at some point but she did not start it as her sister had a bad experience with it. Her hemoglobin A1c has ranged from 7.0's % , peaking at 11.2 %in 2020.    On her initial visit to our clinic, her A1c was 11.2%. She was on Levemir and metformin . We added [place] and stopped Glipizide. Due to hematuria she stopped Jardiance    Rybelsus started 10/2019 but did not start by her next visit in 03/2020 due to planning on eye sx and did not want to start before then? , she was advised to restart by 03/2020  SUBJECTIVE: stress   During the last visit (04/02/2020): A1c 7.2 %.  We continued Levemir Metformin and started Rybelsus      Today (09/24/2020): Jacqueline [name] is here for a follow up on diabetes. She is accompanied by her Spouse [name]. She checks her blood sugars 2 hrs post meals. The patient has not had hypoglycemic episodes since the last clinic visit.     Had a TIA in 09/2020, rosuvastatin increased to 40 mg  Has mild left leg weakness  Her metformin was decreased during the hospital to once daily   Has noted numbness of the toes, started on the right ~ 1 month ago , followed by the left toes     HOME DIABETES REGIMEN:  Levemir 25  units daily  Metformin 1000 mg Twice a day - taking once daily  Rybelsus 3 mg daily - not taking      METER DOWNLOAD SUMMARY: Did not bring       DIABETIC COMPLICATIONS: Microvascular complications:  Neuropathy Denies: retinopathy,  CKD Last eye exam: Completed 2021      Macrovascular complications:  TIA ( 99991111) and 09/2020 Denies: CAD, PVD    HISTORY:  Past Medical History:  Past Medical History:  Diagnosis Date   Diabetes mellitus    HLD (hyperlipidemia)    Hypertension    Nephrolithiasis    Obese    TIA (transient ischemic attack) 2020   Past Surgical History:  Past Surgical History:  Procedure Laterality Date   ROTATOR CUFF REPAIR  2009   TUBAL LIGATION  1979   Social History:  reports that she has never smoked. She has never used smokeless tobacco. She reports that she does not drink alcohol and does not use drugs. Family History:  Family History  Problem Relation Age of Onset   Diabetes Father    Heart attack Father 45   Heart disease Mother        hx cabg at 28   Diabetes type II Brother    Parkinson's disease Sister    Brain cancer Sister    Pancreatic cancer Brother    Diabetes Mellitus II Sister    Diabetes Mellitus II Sister    CAD Sister    Diabetes  Mellitus II Sister    Diabetes Mellitus II Sister    Osteoarthritis Daughter    AAA (abdominal aortic aneurysm) Daughter      HOME MEDICATIONS: Allergies as of 09/24/2020       Reactions   Augmentin [amoxicillin-pot Clavulanate] Other (See Comments)   Altered mental status Did it involve swelling of the face/tongue/throat, SOB, or low BP? Unknown Did it involve sudden or severe rash/hives, skin peeling, or any reaction on the inside of your mouth or nose? Unknown Did you need to seek medical attention at a hospital or doctor's office? Unknown When did it last happen? unk       If all above answers are "NO", may proceed with cephalosporin use.   Definity [perflutren Lipid Microsphere] Other (See Comments)   Headache and facial flushing after Definity IVP given. No SOB or Chest  pain,and Vital signs stable. Irven Baltimore, RN.   Novocain [procaine] Other (See Comments)   Altered mental status   Promethazine Swelling   Tongue swelling        Medication List        Accurate as of September 24, 2020  3:32 PM. If you have any questions, ask your nurse or doctor.          Accu-Chek Guide test strip Generic drug: glucose blood Use as instructed to test blood sugars 3 times daily E11.65   accu-chek multiclix lancets Use as instructed to test blood sugars 3 times daily E11.65   aspirin 81 MG EC tablet Take 1 tablet (81 mg total) by mouth daily.   BD Pen Needle Nano 2nd Gen 32G X 4 MM Misc Generic drug: Insulin Pen Needle USE DAILY AS DIRECTED   clopidogrel 75 MG tablet Commonly known as: PLAVIX Take 1 tablet (75 mg total) by mouth daily for 21 days.   insulin detemir 100 UNIT/ML FlexPen Commonly known as: LEVEMIR Inject 20 Units into the skin daily.   losartan 25 MG tablet Commonly known as: COZAAR Take 1 tablet (25 mg total) by mouth daily.   metFORMIN 1000 MG tablet Commonly known as: GLUCOPHAGE Take 1 tablet (1,000 mg total) by mouth 2 (two) times daily with a meal.   rosuvastatin 40 MG tablet Commonly known as: CRESTOR Take 1 tablet (40 mg total) by mouth at bedtime.   sodium polystyrene 15 GM/60ML suspension Commonly known as: KAYEXALATE 60 ml po x 2 days   topiramate 50 MG tablet Commonly known as: TOPAMAX Take 1 tablet (50 mg total) by mouth at bedtime.         OBJECTIVE:   Vital Signs: BP 126/80 (BP Location: Left Arm, Patient Position: Sitting, Cuff Size: Normal)   Pulse 85   Ht '5\' 5"'$  (1.651 m)   Wt 193 lb (87.5 kg)   SpO2 96%   BMI 32.12 kg/m   Wt Readings from Last 3 Encounters:  09/24/20 193 lb (87.5 kg)  09/18/20 192 lb (87.1 kg)  09/12/20 201 lb 1 oz (91.2 kg)     Exam: General: Pt appears well and is in NAD  Lungs: Clear with good BS bilat with no rales, rhonchi, or wheezes  Heart: RRR   Extremities: No  pretibial edema.  Neuro: MS is good with appropriate affect, pt is alert and Ox3       DM foot exam: 09/24/2020   The skin of the feet is intact without sores or ulcerations. The pedal pulses are 2+ on right and 2+ on left. The sensation is intact  to a screening 5.07, 10 gram monofilament bilaterally    DATA REVIEWED:  Lab Results  Component Value Date   HGBA1C 7.9 (H) 09/13/2020   HGBA1C 7.2 (A) 04/02/2020   HGBA1C 6.9 (A) 11/03/2019   Lab Results  Component Value Date   MICROALBUR 1.4 04/02/2020   LDLCALC 134 (H) 09/13/2020   CREATININE 0.92 09/18/2020    Lab Results  Component Value Date   CHOL 209 (H) 09/13/2020   HDL 34 (L) 09/13/2020   LDLCALC 134 (H) 09/13/2020   TRIG 203 (H) 09/13/2020   CHOLHDL 6.1 09/13/2020         ASSESSMENT / PLAN / RECOMMENDATIONS:   1) Type 2 Diabetes Mellitus, Sub-Optimally controlled, With neuropathic and Macrovascular  complications - Most recent A1c of 7.9 %. Goal A1c < 7.0 %.     -Her A1c slightly elevated -- Based on CT scan in 12/2018 showed evidence of left renal nephrolithiasis, so I have asked her to hold off on the Jardiance, due to the increased risk of UTI , will reconsider if the renal stone resolves - She has not started Rybelsus and not interested at this time  - Will increase metformin and insulin as below    MEDICATIONS: Increase  Levemir 25 units daily  Increase  Metformin 1000 mg back up to BID     EDUCATION / INSTRUCTIONS: BG monitoring instructions: Patient is instructed to check her blood sugars 2 times a day, fasting and bedtime . Call [facility] if: BG persistently < 70  I reviewed the Rule of 15 for the treatment of hypoglycemia in detail with the patient. Literature supplied.   2) Peripheral neuropathy :   - This is mild, she was advised to use capsaicin cream PRN      F/U in 3 months   Signed electronically by: Mack Guise, MD  [facility]  [facility] [address] [place], [place] [address] Phone: [phone] FAX: [phone]   CC: [name], [address] [place] [address] Phone: [phone]  Fax: [phone]  Return to Endocrinology clinic as below: Future Appointments  Date Time Provider Thor  10/01/2020 12:00 PM [name], NP [facility]  10/16/2020  8:15 AM Frann Rider, NP [facility] None

## 2020-09-24 ENCOUNTER — Ambulatory Visit (INDEPENDENT_AMBULATORY_CARE_PROVIDER_SITE_OTHER): Payer: Medicare HMO | Admitting: Internal Medicine

## 2020-09-24 ENCOUNTER — Other Ambulatory Visit: Payer: Self-pay

## 2020-09-24 ENCOUNTER — Ambulatory Visit: Payer: Medicare HMO | Admitting: Family

## 2020-09-24 VITALS — BP 126/80 | HR 85 | Ht 65.0 in | Wt 193.0 lb

## 2020-09-24 DIAGNOSIS — E1165 Type 2 diabetes mellitus with hyperglycemia: Secondary | ICD-10-CM

## 2020-09-24 DIAGNOSIS — Z794 Long term (current) use of insulin: Secondary | ICD-10-CM | POA: Diagnosis not present

## 2020-09-24 DIAGNOSIS — E1159 Type 2 diabetes mellitus with other circulatory complications: Secondary | ICD-10-CM | POA: Diagnosis not present

## 2020-09-24 DIAGNOSIS — E1142 Type 2 diabetes mellitus with diabetic polyneuropathy: Secondary | ICD-10-CM | POA: Diagnosis not present

## 2020-09-24 NOTE — Patient Instructions (Signed)
-   Increase  Metformin 1000 mg , 1 tablet Twice a day  - Continue Levemir 25 units daily    Try Capsaicin cream ( over the counter ) for numbness of the toes    HOW TO TREAT LOW BLOOD SUGARS (Blood sugar LESS THAN 70 MG/DL) Please follow the RULE OF 15 for the treatment of hypoglycemia treatment (when your (blood sugars are less than 70 mg/dL)   STEP 1: Take 15 grams of carbohydrates when your blood sugar is low, which includes:  3-4 GLUCOSE TABS  OR 3-4 OZ OF JUICE OR REGULAR SODA OR ONE TUBE OF GLUCOSE GEL    STEP 2: RECHECK blood sugar in 15 MINUTES STEP 3: If your blood sugar is still low at the 15 minute recheck --> then, go back to STEP 1 and treat AGAIN with another 15 grams of carbohydrates.

## 2020-09-25 ENCOUNTER — Encounter: Payer: Self-pay | Admitting: Internal Medicine

## 2020-09-25 DIAGNOSIS — E119 Type 2 diabetes mellitus without complications: Secondary | ICD-10-CM | POA: Insufficient documentation

## 2020-09-25 DIAGNOSIS — E1142 Type 2 diabetes mellitus with diabetic polyneuropathy: Secondary | ICD-10-CM | POA: Insufficient documentation

## 2020-09-25 DIAGNOSIS — Z794 Long term (current) use of insulin: Secondary | ICD-10-CM | POA: Insufficient documentation

## 2020-09-25 DIAGNOSIS — E1165 Type 2 diabetes mellitus with hyperglycemia: Secondary | ICD-10-CM | POA: Insufficient documentation

## 2020-10-01 ENCOUNTER — Other Ambulatory Visit: Payer: Self-pay

## 2020-10-01 ENCOUNTER — Ambulatory Visit (INDEPENDENT_AMBULATORY_CARE_PROVIDER_SITE_OTHER): Payer: Medicare HMO | Admitting: Family

## 2020-10-01 VITALS — BP 138/68 | HR 77 | Temp 98.4°F | Resp 16 | Ht 65.0 in | Wt 193.4 lb

## 2020-10-01 DIAGNOSIS — M5414 Radiculopathy, thoracic region: Secondary | ICD-10-CM

## 2020-10-01 DIAGNOSIS — E785 Hyperlipidemia, unspecified: Secondary | ICD-10-CM

## 2020-10-01 DIAGNOSIS — I10 Essential (primary) hypertension: Secondary | ICD-10-CM | POA: Diagnosis not present

## 2020-10-01 DIAGNOSIS — M7989 Other specified soft tissue disorders: Secondary | ICD-10-CM

## 2020-10-01 DIAGNOSIS — I639 Cerebral infarction, unspecified: Secondary | ICD-10-CM | POA: Diagnosis not present

## 2020-10-01 DIAGNOSIS — E1159 Type 2 diabetes mellitus with other circulatory complications: Secondary | ICD-10-CM | POA: Diagnosis not present

## 2020-10-01 NOTE — Progress Notes (Signed)
Subjective:   By signing my name below, I, Shehryar Baig, attest that this documentation has been prepared under the direction and in the presence of [name] NP. 10/01/2020    Patient ID: Jacqueline [name], female    DOB: [birth year], 70 y.o.   MRN: [id number]  Chief Complaint  Patient presents with   Flank Pain    Complains of right flank  pain     Cyst    Patient reports feeling "knots on my bottom" while showering.     Flank Pain  Patient is in today for a office visit.  Back pain- She complains of right flank pain since yesterday.  Cyst- She also complains of a cyst on her buttock she found while showering.  Stroke- She reports having a stroke on 09/12/2020 and going to the ED. She notes having numbness and weakness in her legs since her stroke. She has not had time to see her physical therapist yet but is planning to see them more often. Prior to going to the ED she was taking 81 mg aspirin daily PO and continues taking it at this time.  Chest pain- She reports having chest pain while taking 75 mg Plavix daily PO. The pain starts 20-25 minutes after taking the medication. She has not taken her 75 mg Plavix daily PO today.   Blood pressure- Her blood pressure is doing well during this visit. She continues taking 25 mg losartan daily PO and reports no new issues while taking it. She reports her blood pressure was elevated while in the ED for a stroke.   BP Readings from Last 3 Encounters:  10/01/20 138/68  09/24/20 126/80  09/18/20 140/70   Cholesterol- She continues taking 40 mg Crestor daily PO and reports no new issues while taking it.   Lab Results  Component Value Date   CHOL 209 (H) 09/13/2020   HDL 34 (L) 09/13/2020   LDLCALC 134 (H) 09/13/2020   TRIG 203 (H) 09/13/2020   CHOLHDL 6.1 09/13/2020    Health Maintenance Due  Topic Date Due   Hepatitis C Screening  Never done   COLONOSCOPY (Pts 45-68yr Insurance coverage will need to be confirmed)  Never  done   MAMMOGRAM  Never done   Zoster Vaccines- Shingrix (1 of 2) Never done   DEXA SCAN  Never done   PNA vac Low Risk Adult (2 of 2 - PPSV23) 07/18/2018   COVID-19 Vaccine (3 - Booster for Moderna series) 10/19/2019   FOOT EXAM  05/02/2020   OPHTHALMOLOGY EXAM  09/04/2020   INFLUENZA VACCINE  09/10/2020    Past Medical History:  Diagnosis Date   Diabetes mellitus    HLD (hyperlipidemia)    Hypertension    Nephrolithiasis    Obese    TIA (transient ischemic attack) 2020    Past Surgical History:  Procedure Laterality Date   ROTATOR CUFF REPAIR  2009   TUBAL LIGATION  1979    Family History  Problem Relation Age of Onset   Diabetes Father    Heart attack Father 674  Heart disease Mother        hx cabg at 881  Diabetes type II Brother    Parkinson's disease Sister    Brain cancer Sister    Pancreatic cancer Brother    Diabetes Mellitus II Sister    Diabetes Mellitus II Sister    CAD Sister    Diabetes Mellitus II Sister    Diabetes Mellitus II  Sister    Osteoarthritis Daughter    AAA (abdominal aortic aneurysm) Daughter     Social History   Socioeconomic History   Marital status: Married    Spouse name: Not on file   Number of children: 4   Years of education: Not on file   Highest education level: Not on file  Occupational History   Occupation: Retired-Missionary  Tobacco Use   Smoking status: Never   Smokeless tobacco: Never  Vaping Use   Vaping Use: Never used  Substance and Sexual Activity   Alcohol use: No   Drug use: No   Sexual activity: Yes    Partners: Male    Birth control/protection: Post-menopausal  Other Topics Concern   Not on file  Social History Narrative   Retired Media planner   Husband is a Theme park manager- does some missions in [place]   2 great grandchildren (live in [place])   Married 35 years   4 children- 1 [place], 1  [place], 1 in [place], 1 at home   8 granchildren   Enjoys crafts   No pets   Social  Determinants of Radio broadcast assistant Strain: Not on file  Food Insecurity: Not on file  Transportation Needs: Not on file  Physical Activity: Not on file  Stress: Not on file  Social Connections: Not on file  Intimate Partner Violence: Not on file    Outpatient Medications Prior to Visit  Medication Sig Dispense Refill   aspirin EC 81 MG EC tablet Take 1 tablet (81 mg total) by mouth daily. 21 tablet 0   BD PEN NEEDLE NANO 2ND GEN 32G X 4 MM MISC USE DAILY AS DIRECTED 100 each 2   clopidogrel (PLAVIX) 75 MG tablet Take 1 tablet (75 mg total) by mouth daily for 21 days. 21 tablet 0   glucose blood (ACCU-CHEK GUIDE) test strip Use as instructed to test blood sugars 3 times daily E11.65 100 each 12   insulin detemir (LEVEMIR) 100 UNIT/ML FlexPen Inject 20 Units into the skin daily. 30 mL 3   Lancets (ACCU-CHEK MULTICLIX) lancets Use as instructed to test blood sugars 3 times daily E11.65 100 each 12   losartan (COZAAR) 25 MG tablet Take 1 tablet (25 mg total) by mouth daily. 30 tablet 0   metFORMIN (GLUCOPHAGE) 1000 MG tablet Take 1 tablet (1,000 mg total) by mouth 2 (two) times daily with a meal. 180 tablet 3   rosuvastatin (CRESTOR) 40 MG tablet Take 1 tablet (40 mg total) by mouth at bedtime. 90 tablet 2   sodium polystyrene (KAYEXALATE) 15 GM/60ML suspension 60 ml po x 2 days 120 mL 0   topiramate (TOPAMAX) 50 MG tablet Take 1 tablet (50 mg total) by mouth at bedtime. 90 tablet 3   No facility-administered medications prior to visit.    Allergies  Allergen Reactions   Augmentin [Amoxicillin-Pot Clavulanate] Other (See Comments)    Altered mental status Did it involve swelling of the face/tongue/throat, SOB, or low BP? Unknown Did it involve sudden or severe rash/hives, skin peeling, or any reaction on the inside of your mouth or nose? Unknown Did you need to seek medical attention at a hospital or doctor's office? Unknown When did it last happen? unk       If all above  answers are "NO", may proceed with cephalosporin use.    Definity [Perflutren Lipid Microsphere] Other (See Comments)    Headache and facial flushing after Definity IVP given. No SOB or  Chest pain,and Vital signs stable. Irven Baltimore, RN.   Novocain [Procaine] Other (See Comments)    Altered mental status   Promethazine Swelling    Tongue swelling    Review of Systems  Genitourinary:  Positive for flank pain.      Objective:    Physical Exam Constitutional:      General: She is not in acute distress.    Appearance: Normal appearance. She is not ill-appearing.  HENT:     Head: Normocephalic and atraumatic.     Right Ear: External ear normal.     Left Ear: External ear normal.  Eyes:     Extraocular Movements: Extraocular movements intact.     Pupils: Pupils are equal, round, and reactive to light.  Cardiovascular:     Rate and Rhythm: Normal rate and regular rhythm.     Heart sounds: Normal heart sounds. No murmur heard.   No gallop.  Pulmonary:     Effort: Pulmonary effort is normal. No respiratory distress.     Breath sounds: Normal breath sounds. No wheezing or rales.  Skin:    General: Skin is warm and dry.     Comments: Mass at the top right of the gluteal fold that is soft and non-tender and approximately  1 in diameter  Neurological:     Mental Status: She is alert and oriented to person, place, and time.     Cranial Nerves: Cranial nerves are intact.     Sensory: Sensation is intact.     Comments: Bilateral UE/LE strength is 5/5  Psychiatric:        Behavior: Behavior normal.    BP 138/68 (BP Location: Right Arm, Patient Position: Sitting, Cuff Size: Small)   Pulse 77   Temp 98.4 F (36.9 C) (Oral)   Resp 16   Ht '5\' 5"'$  (1.651 m)   Wt 193 lb 6.4 oz (87.7 kg)   SpO2 99%   BMI 32.18 kg/m  Wt Readings from Last 3 Encounters:  10/01/20 193 lb 6.4 oz (87.7 kg)  09/24/20 193 lb (87.5 kg)  09/18/20 192 lb (87.1 kg)       Assessment & Plan:   Problem  List Items Addressed This Visit       Unprioritized   Soft tissue mass - Primary   Relevant Orders   [place] MiscellaneoUS Localization   Radiculopathy of thoracic region   Hypertension    BP at goal. Continue losartan '25mg'$  once daily.      HLD (hyperlipidemia)    LDL goal <70. Now on crestor '40mg'$ . Plan repeat lipids next visti.       DM type 2 (diabetes mellitus, type 2) ([place])    Lab Results  Component Value Date   HGBA1C 7.9 (H) 09/13/2020  Fair A1C, defer management to endocrinology.       Acute CVA (cerebrovascular accident) ([place])    BP stable. Continue new statin, plavix.  I do not think her right sided chest discomfort is related to plavix.          No orders of the defined types were placed in this encounter.   I, [name] NP, personally preformed the services described in this documentation.  All medical record entries made by the scribe were at my direction and in my presence.  I have reviewed the chart and discharge instructions (if applicable) and agree that the record reflects my personal performance and is accurate and complete. 10/01/2020   I,Shehryar Baig,acting as a scribe  for Nance Pear, NP.,have documented all relevant documentation on the behalf of Nance Pear, NP,as directed by  Nance Pear, NP while in the presence of Nance Pear, NP.   Nance Pear, NP

## 2020-10-02 ENCOUNTER — Telehealth: Payer: Self-pay | Admitting: Family

## 2020-10-02 DIAGNOSIS — M7989 Other specified soft tissue disorders: Secondary | ICD-10-CM | POA: Insufficient documentation

## 2020-10-02 DIAGNOSIS — M5414 Radiculopathy, thoracic region: Secondary | ICD-10-CM | POA: Insufficient documentation

## 2020-10-02 NOTE — Assessment & Plan Note (Addendum)
BP stable. Continue new statin, plavix.  I do not think her right sided chest discomfort is related to plavix.  She plans to start home health PT which I think would be good for her.

## 2020-10-02 NOTE — Assessment & Plan Note (Signed)
Lab Results  Component Value Date   HGBA1C 7.9 (H) 09/13/2020   Fair A1C, defer management to endocrinology.

## 2020-10-02 NOTE — Telephone Encounter (Signed)
Jacqueline LemmingsRemo [id number] Stated that Pt. doesn't meet home bound health care needs based off evaluation of pt after hospital release. Jacqueline [name] wanted to make PCP aware of evaluation. Jacqueline [name] stated pt is doing very very well.

## 2020-10-02 NOTE — Assessment & Plan Note (Signed)
LDL goal <70. Now on crestor '40mg'$ . Plan repeat lipids next visti.

## 2020-10-02 NOTE — Assessment & Plan Note (Signed)
BP at goal. Continue losartan '25mg'$  once daily.

## 2020-10-08 ENCOUNTER — Telehealth: Payer: Self-pay

## 2020-10-08 ENCOUNTER — Encounter: Payer: Self-pay | Admitting: Family

## 2020-10-08 NOTE — Telephone Encounter (Signed)
FYI Patient had  elevated potassium 8-9 at 5.3, was prescribed sodium p. 8-10 by Mackie Pai PA. Patient called today to report she just received the medication yesterday from her pharmacy. She did not take because it was in the porch for a while and it felt hot (not sure if safe to take) She is not having any palpitations, bp is in the 125/60 range and pulse 60-80 per patient.  She was schedule to be seen tomorrow am.

## 2020-10-09 ENCOUNTER — Telehealth: Payer: Self-pay | Admitting: Family

## 2020-10-09 ENCOUNTER — Other Ambulatory Visit: Payer: Self-pay

## 2020-10-09 ENCOUNTER — Encounter (HOSPITAL_BASED_OUTPATIENT_CLINIC_OR_DEPARTMENT_OTHER): Payer: Self-pay

## 2020-10-09 ENCOUNTER — Ambulatory Visit (INDEPENDENT_AMBULATORY_CARE_PROVIDER_SITE_OTHER): Payer: Medicare HMO | Admitting: Family

## 2020-10-09 ENCOUNTER — Ambulatory Visit (HOSPITAL_BASED_OUTPATIENT_CLINIC_OR_DEPARTMENT_OTHER): Payer: Medicare HMO

## 2020-10-09 VITALS — BP 138/68 | HR 74 | Temp 98.2°F | Resp 16 | Wt 190.0 lb

## 2020-10-09 DIAGNOSIS — Z8673 Personal history of transient ischemic attack (TIA), and cerebral infarction without residual deficits: Secondary | ICD-10-CM

## 2020-10-09 DIAGNOSIS — Z23 Encounter for immunization: Secondary | ICD-10-CM

## 2020-10-09 DIAGNOSIS — E875 Hyperkalemia: Secondary | ICD-10-CM | POA: Diagnosis not present

## 2020-10-09 DIAGNOSIS — F419 Anxiety disorder, unspecified: Secondary | ICD-10-CM | POA: Diagnosis not present

## 2020-10-09 DIAGNOSIS — F32A Depression, unspecified: Secondary | ICD-10-CM | POA: Diagnosis not present

## 2020-10-09 LAB — BASIC METABOLIC PANEL
BUN: 12 mg/dL (ref 6–23)
CO2: 26 mEq/L (ref 19–32)
Calcium: 9.5 mg/dL (ref 8.4–10.5)
Chloride: 107 mEq/L (ref 96–112)
Creatinine, Ser: 1.05 mg/dL (ref 0.40–1.20)
GFR: 53.5 mL/min — ABNORMAL LOW (ref >60.00)
Glucose, Bld: 123 mg/dL — ABNORMAL HIGH (ref 70–99)
Potassium: 4.6 mEq/L (ref 3.5–5.1)
Sodium: 141 mEq/L (ref 135–145)

## 2020-10-09 MED ORDER — FLUOXETINE HCL 10 MG PO CAPS: 10.0000 mg | ORAL_CAPSULE | Freq: Every day | ORAL | 1 refills | Status: DC

## 2020-10-09 NOTE — Assessment & Plan Note (Signed)
BP stable. On statin/plavix/aspirin. Recommendation at discharge was to d/c plavix after 21 days. She has been on x 23 days. Recommended d/c plavix at this time. Discussed low sodium diet and healthy food preparation.

## 2020-10-09 NOTE — Assessment & Plan Note (Addendum)
New/Uncontrolled following recent CVA.  Will initiate prozac 10 mg once daily.

## 2020-10-09 NOTE — Telephone Encounter (Signed)
Patient advised of normal results and no need to take medication.

## 2020-10-09 NOTE — Patient Instructions (Signed)
Please complete lab work prior to leaving. Start prozac 10mg once daily.   

## 2020-10-09 NOTE — Telephone Encounter (Signed)
Noted  

## 2020-10-09 NOTE — Progress Notes (Signed)
Subjective:     Patient ID: Jacqueline [name], female    DOB: Sep 27, 1950, 70 y.o.   MRN: [id number]  Chief Complaint  Patient presents with   Hyperkalemia    Patient here for follow up of hyperkalemia per last labs on 09-18-20    HPI Patient is in today for follow up. She had a mild elevation of her potassium back on 8/9 (5.3).  Kayexalate was called in by another provider, but she did not take it. States the box was hot and she read that the medication should not become warm.    She is still fatigued. Husband reports that she cries frequently following her CVA.  He notes that she becomes frequently anxious that food choices may induce another stroke.   HH PT came to her house and felt that she did not need PT at this time. She has been doing some recommended home exercises on her own.   Health Maintenance Due  Topic Date Due   Hepatitis C Screening  Never done   COLONOSCOPY (Pts 45-95yr Insurance coverage will need to be confirmed)  Never done   MAMMOGRAM  Never done   Zoster Vaccines- Shingrix (1 of 2) Never done   DEXA SCAN  Never done   PNA vac Low Risk Adult (2 of 2 - PPSV23) 07/18/2018   COVID-19 Vaccine (3 - Booster for Moderna series) 10/19/2019   FOOT EXAM  05/02/2020   OPHTHALMOLOGY EXAM  09/04/2020    Past Medical History:  Diagnosis Date   Diabetes mellitus    HLD (hyperlipidemia)    Hypertension    Nephrolithiasis    Obese    TIA (transient ischemic attack) 2020    Past Surgical History:  Procedure Laterality Date   ROTATOR CUFF REPAIR  2009   TUBAL LIGATION  1979    Family History  Problem Relation Age of Onset   Diabetes Father    Heart attack Father 681  Heart disease Mother        hx cabg at 857  Diabetes type II Brother    Parkinson's disease Sister    Brain cancer Sister    Pancreatic cancer Brother    Diabetes Mellitus II Sister    Diabetes Mellitus II Sister    CAD Sister    Diabetes Mellitus II Sister    Diabetes Mellitus II Sister     Osteoarthritis Daughter    AAA (abdominal aortic aneurysm) Daughter     Social History   Socioeconomic History   Marital status: Married    Spouse name: Not on file   Number of children: 4   Years of education: Not on file   Highest education level: Not on file  Occupational History   Occupation: Retired-Missionary  Tobacco Use   Smoking status: Never   Smokeless tobacco: Never  Vaping Use   Vaping Use: Never used  Substance and Sexual Activity   Alcohol use: No   Drug use: No   Sexual activity: Yes    Partners: Male    Birth control/protection: Post-menopausal  Other Topics Concern   Not on file  Social History Narrative   Retired oMedia planner  Husband is a pTheme park manager does some missions in [place]  2 great grandchildren (live in [place]   Married 535years   4 children- 1 [place], 1  [place], 1 in [place] 1 at home   8 granchildren   Enjoys crafts   No pets   Social  Determinants of Health   Financial Resource Strain: Not on file  Food Insecurity: Not on file  Transportation Needs: Not on file  Physical Activity: Not on file  Stress: Not on file  Social Connections: Not on file  Intimate Partner Violence: Not on file    Outpatient Medications Prior to Visit  Medication Sig Dispense Refill   aspirin EC 81 MG EC tablet Take 1 tablet (81 mg total) by mouth daily. 21 tablet 0   BD PEN NEEDLE NANO 2ND GEN 32G X 4 MM MISC USE DAILY AS DIRECTED 100 each 2   glucose blood (ACCU-CHEK GUIDE) test strip Use as instructed to test blood sugars 3 times daily E11.65 100 each 12   insulin detemir (LEVEMIR) 100 UNIT/ML FlexPen Inject 20 Units into the skin daily. 30 mL 3   Lancets (ACCU-CHEK MULTICLIX) lancets Use as instructed to test blood sugars 3 times daily E11.65 100 each 12   losartan (COZAAR) 25 MG tablet Take 1 tablet (25 mg total) by mouth daily. 30 tablet 0   metFORMIN (GLUCOPHAGE) 1000 MG tablet Take 1 tablet (1,000 mg total) by mouth 2 (two)  times daily with a meal. 180 tablet 3   rosuvastatin (CRESTOR) 40 MG tablet Take 1 tablet (40 mg total) by mouth at bedtime. 90 tablet 2   topiramate (TOPAMAX) 50 MG tablet Take 1 tablet (50 mg total) by mouth at bedtime. 90 tablet 3   sodium polystyrene (KAYEXALATE) 15 GM/60ML suspension 60 ml po x 2 days (Patient not taking: Reported on 10/09/2020) 120 mL 0   No facility-administered medications prior to visit.    Allergies  Allergen Reactions   Augmentin [Amoxicillin-Pot Clavulanate] Other (See Comments)    Altered mental status Did it involve swelling of the face/tongue/throat, SOB, or low BP? Unknown Did it involve sudden or severe rash/hives, skin peeling, or any reaction on the inside of your mouth or nose? Unknown Did you need to seek medical attention at a hospital or doctor's office? Unknown When did it last happen? unk       If all above answers are "NO", may proceed with cephalosporin use.    Definity [Perflutren Lipid Microsphere] Other (See Comments)    Headache and facial flushing after Definity IVP given. No SOB or Chest pain,and Vital signs stable. Irven Baltimore, RN.   Novocain [Procaine] Other (See Comments)    Altered mental status   Promethazine Swelling    Tongue swelling    ROS See HPI    Objective:    Physical Exam Constitutional:      Appearance: Normal appearance. She is well-developed.  Cardiovascular:     Rate and Rhythm: Normal rate and regular rhythm.     Heart sounds: Normal heart sounds. No murmur heard. Pulmonary:     Effort: Pulmonary effort is normal. No respiratory distress.     Breath sounds: Normal breath sounds. No wheezing.  Skin:    General: Skin is warm and dry.  Neurological:     Mental Status: She is alert and oriented to person, place, and time.  Psychiatric:        Attention and Perception: Attention normal.        Mood and Affect: Affect is tearful.        Speech: Speech normal.        Behavior: Behavior normal.         Thought Content: Thought content normal.        Judgment: Judgment normal.  BP 138/68 (BP Location: Right Arm, Patient Position: Sitting, Cuff Size: Small)   Pulse 74   Temp 98.2 F (36.8 C) (Oral)   Resp 16   Wt 190 lb (86.2 kg)   SpO2 98%   BMI 31.62 kg/m  Wt Readings from Last 3 Encounters:  10/09/20 190 lb (86.2 kg)  10/01/20 193 lb 6.4 oz (87.7 kg)  09/24/20 193 lb (87.5 kg)       Assessment & Plan:   Problem List Items Addressed This Visit       Unprioritized   History of CVA (cerebrovascular accident)    BP stable. On statin/plavix/aspirin. Recommendation at discharge was to d/c plavix after 21 days. She has been on x 23 days. Recommended d/c plavix at this time. Discussed low sodium diet and healthy food preparation.       Anxiety and depression    New/Uncontrolled following recent CVA.  Will initiate prozac 10 mg once daily.       Relevant Medications   FLUoxetine (PROZAC) 10 MG capsule   Other Visit Diagnoses     Hyperkalemia    -  Primary   Relevant Orders   Basic metabolic panel   Needs flu shot       Relevant Orders   Flu Vaccine QUAD High Dose(Fluad) (Completed)       I am having Jacqueline [name] start on FLUoxetine. I am also having her maintain her aspirin, accu-chek multiclix, Accu-Chek Guide, BD Pen Needle Nano 2nd Gen, metFORMIN, insulin detemir, topiramate, rosuvastatin, losartan, and sodium polystyrene.  Meds ordered this encounter  Medications   FLUoxetine (PROZAC) 10 MG capsule    Sig: Take 1 capsule (10 mg total) by mouth daily.    Dispense:  30 capsule    Refill:  1    Order Specific Question:   Supervising Provider    Answer:   Penni Homans A [[id number]]

## 2020-10-09 NOTE — Telephone Encounter (Signed)
Please advise pt that follow up potassium is normal.  No need to take kayexalate.

## 2020-10-11 ENCOUNTER — Other Ambulatory Visit: Payer: Self-pay

## 2020-10-11 ENCOUNTER — Ambulatory Visit (INDEPENDENT_AMBULATORY_CARE_PROVIDER_SITE_OTHER): Payer: Medicare HMO | Admitting: Family

## 2020-10-11 ENCOUNTER — Encounter: Payer: Self-pay | Admitting: Family

## 2020-10-11 VITALS — BP 154/60 | HR 73 | Temp 97.8°F | Ht 65.0 in | Wt 190.4 lb

## 2020-10-11 DIAGNOSIS — T7840XA Allergy, unspecified, initial encounter: Secondary | ICD-10-CM

## 2020-10-11 DIAGNOSIS — E1159 Type 2 diabetes mellitus with other circulatory complications: Secondary | ICD-10-CM

## 2020-10-11 DIAGNOSIS — R220 Localized swelling, mass and lump, head: Secondary | ICD-10-CM | POA: Diagnosis not present

## 2020-10-11 MED ORDER — FAMOTIDINE 20 MG PO TABS
20.0000 mg | ORAL_TABLET | Freq: Two times a day (BID) | ORAL | 0 refills | Status: DC
Start: 2020-10-11 — End: 2020-11-07

## 2020-10-11 MED ORDER — METFORMIN HCL 1000 MG PO TABS: 1000.0000 mg | ORAL_TABLET | Freq: Two times a day (BID) | ORAL | 0 refills | Status: DC

## 2020-10-11 MED ORDER — PREDNISONE 10 MG PO TABS: 10.0000 mg | ORAL_TABLET | Freq: Every day | ORAL | 0 refills | Status: DC

## 2020-10-11 NOTE — Progress Notes (Signed)
Jacqueline [name] is a 70 y.o. female with the following history as recorded in [employer]:  Patient Active Problem List   Diagnosis Date Noted   Anxiety and depression 10/09/2020   Soft tissue mass 10/02/2020   Radiculopathy of thoracic region 10/02/2020   Type 2 diabetes mellitus with hyperglycemia, with long-term current use of insulin ([place]) 09/25/2020   Type 2 diabetes mellitus with diabetic polyneuropathy, with long-term current use of insulin ([place]) 09/25/2020   History of CVA (cerebrovascular accident) 09/12/2020   Hypertension 09/12/2020   Hematuria 12/29/2018   Migraine without status migrainosus, not intractable 12/29/2018   Trigeminal neuralgia 12/29/2018   TIA (transient ischemic attack) 07/13/2018   Left-sided weakness 07/13/2018   Morbid obesity ([place]) 11/25/2013   HLD (hyperlipidemia) 11/25/2013   DM type 2 (diabetes mellitus, type 2) ([place]) 11/25/2013    Current Outpatient Medications  Medication Sig Dispense Refill   aspirin EC 81 MG EC tablet Take 1 tablet (81 mg total) by mouth daily. 21 tablet 0   BD PEN NEEDLE NANO 2ND GEN 32G X 4 MM MISC USE DAILY AS DIRECTED 100 each 2   famotidine (PEPCID) 20 MG tablet Take 1 tablet (20 mg total) by mouth 2 (two) times daily. 20 tablet 0   glucose blood (ACCU-CHEK GUIDE) test strip Use as instructed to test blood sugars 3 times daily E11.65 100 each 12   insulin detemir (LEVEMIR) 100 UNIT/ML FlexPen Inject 20 Units into the skin daily. 30 mL 3   Lancets (ACCU-CHEK MULTICLIX) lancets Use as instructed to test blood sugars 3 times daily E11.65 100 each 12   losartan (COZAAR) 25 MG tablet Take 1 tablet (25 mg total) by mouth daily. 30 tablet 0   predniSONE (DELTASONE) 10 MG tablet Take 1 tablet (10 mg total) by mouth daily with breakfast. 5 tablet 0   rosuvastatin (CRESTOR) 40 MG tablet Take 1 tablet (40 mg total) by mouth at bedtime. 90 tablet 2   topiramate (TOPAMAX) 50 MG tablet Take 1 tablet (50 mg total) by mouth at bedtime. 90  tablet 3   FLUoxetine (PROZAC) 10 MG capsule Take 1 capsule (10 mg total) by mouth daily. 30 capsule 1   metFORMIN (GLUCOPHAGE) 1000 MG tablet Take 1 tablet (1,000 mg total) by mouth 2 (two) times daily with a meal. 30 tablet 0   sodium polystyrene (KAYEXALATE) 15 GM/60ML suspension 60 ml po x 2 days (Patient not taking: No sig reported) 120 mL 0   No current facility-administered medications for this visit.    Allergies: Augmentin [amoxicillin-pot clavulanate], Definity [perflutren lipid microsphere], Novocain [procaine], Promethazine, and Prozac [fluoxetine hcl]  Past Medical History:  Diagnosis Date   Diabetes mellitus    HLD (hyperlipidemia)    Hypertension    Nephrolithiasis    Obese    TIA (transient ischemic attack) 2020    Past Surgical History:  Procedure Laterality Date   ROTATOR CUFF REPAIR  2009   TUBAL LIGATION  1979    Family History  Problem Relation Age of Onset   Diabetes Father    Heart attack Father 58   Heart disease Mother        hx cabg at 41   Diabetes type II Brother    Parkinson's disease Sister    Brain cancer Sister    Pancreatic cancer Brother    Diabetes Mellitus II Sister    Diabetes Mellitus II Sister    CAD Sister    Diabetes Mellitus II Sister  Diabetes Mellitus II Sister    Osteoarthritis Daughter    AAA (abdominal aortic aneurysm) Daughter     Social History   Tobacco Use   Smoking status: Never   Smokeless tobacco: Never  Substance Use Topics   Alcohol use: No    Subjective:  Accompanied by husband; was recently started on Prozac 10 mg- took her first pill yesterday morning; notes that when she woke up this morning, her upper lip was swollen. She has only taken 1 tablet of Prozac- did not take today; she has not taken any Benadryl this morning; denies any rash or itching; denies any sensation of tongue swelling or difficulty breathing or swallowing.    Glucose this am was 170;     Objective:  Vitals:   10/11/20 1120  BP:  (!) 154/60  Pulse: 73  Temp: 97.8 F (36.6 C)  TempSrc: Oral  SpO2: 97%  Weight: 190 lb 6.4 oz (86.4 kg)  Height: '5\' 5"'$  (1.651 m)    General: Well developed, well nourished, in no acute distress  Skin : Warm and dry.  Head: Normocephalic and atraumatic  Eyes: Sclera and conjunctiva clear; pupils round and reactive to light; extraocular movements intact  Ears: External normal; canals clear; tympanic membranes normal  Oropharynx: Pink, supple. No suspicious lesions; localized area of swelling noted on central area of upper lip;  Neck: Supple without thyromegaly, adenopathy  Lungs: Respirations unlabored; clear to auscultation bilaterally without wheeze, rales, rhonchi  CVS exam: normal rate and regular rhythm.  Neurologic: Alert and oriented; speech intact; face symmetrical; moves all extremities well; CNII-XII intact without focal deficit   Assessment:  1. Swelling of upper lip   2. Allergic reaction to drug, initial encounter   3. Type 2 diabetes mellitus with other circulatory complication, without long-term current use of insulin ([place])     Plan:  & 2. Suspect secondary to Prozac; has only taken one pill- last took yesterday; will start with combination of Benadryl and Pepcid to treat for allergic reaction. She is given Rx for Prednisone 10 mg- to hold and fill only if symptoms worsen or do not respond to antihistamines; strict ER precautions discussed; 3.   Refill updated on Metformin for short-term refill until she can get updated Rx from mail order.   This visit occurred during the SARS-CoV-2 public health emergency.  Safety protocols were in place, including screening questions prior to the visit, additional usage of staff PPE, and extensive cleaning of exam room while observing appropriate contact time as indicated for disinfecting solutions.    No follow-ups on file.  No orders of the defined types were placed in this encounter.   Requested Prescriptions   Signed  Prescriptions Disp Refills   metFORMIN (GLUCOPHAGE) 1000 MG tablet 30 tablet 0    Sig: Take 1 tablet (1,000 mg total) by mouth 2 (two) times daily with a meal.   famotidine (PEPCID) 20 MG tablet 20 tablet 0    Sig: Take 1 tablet (20 mg total) by mouth 2 (two) times daily.   predniSONE (DELTASONE) 10 MG tablet 5 tablet 0    Sig: Take 1 tablet (10 mg total) by mouth daily with breakfast.

## 2020-10-12 ENCOUNTER — Other Ambulatory Visit: Payer: Self-pay | Admitting: Medical

## 2020-10-16 ENCOUNTER — Encounter: Payer: Self-pay | Admitting: Adult Health

## 2020-10-16 ENCOUNTER — Ambulatory Visit: Payer: Medicare HMO | Admitting: Adult Health

## 2020-10-16 VITALS — BP 140/82 | HR 66 | Ht 65.0 in | Wt 192.0 lb

## 2020-10-16 DIAGNOSIS — I6521 Occlusion and stenosis of right carotid artery: Secondary | ICD-10-CM | POA: Diagnosis not present

## 2020-10-16 DIAGNOSIS — I639 Cerebral infarction, unspecified: Secondary | ICD-10-CM

## 2020-10-16 DIAGNOSIS — I10 Essential (primary) hypertension: Secondary | ICD-10-CM

## 2020-10-16 DIAGNOSIS — Z9189 Other specified personal risk factors, not elsewhere classified: Secondary | ICD-10-CM | POA: Diagnosis not present

## 2020-10-16 DIAGNOSIS — G43109 Migraine with aura, not intractable, without status migrainosus: Secondary | ICD-10-CM | POA: Diagnosis not present

## 2020-10-16 DIAGNOSIS — Z8673 Personal history of transient ischemic attack (TIA), and cerebral infarction without residual deficits: Secondary | ICD-10-CM | POA: Diagnosis not present

## 2020-10-16 DIAGNOSIS — E785 Hyperlipidemia, unspecified: Secondary | ICD-10-CM

## 2020-10-16 MED ORDER — CLOPIDOGREL BISULFATE 75 MG PO TABS: 75.0000 mg | ORAL_TABLET | Freq: Every day | ORAL | 3 refills | Status: DC

## 2020-10-16 NOTE — Patient Instructions (Addendum)
Restart clopidogrel 75 mg daily  and Crestor '40mg'$  daily  for secondary stroke prevention - please stop aspirin once you restart plavix  We will check cholesterol levels today  Please let me know if you would like to pursue counseling/therapy for depression/anxiety  Referral will be placed to see one of our sleep providers for possible sleep apnea  Continue to follow up with PCP/endocrinology regarding cholesterol, blood pressure and diabetes management  Maintain strict control of hypertension with blood pressure goal below 130/90, diabetes with hemoglobin A1c goal below 7% and cholesterol with LDL cholesterol (bad cholesterol) goal below 70 mg/dL.       Followup in the future with me in 4 months or call earlier if needed       Thank you for coming to see [place] at [facility]. I hope we have been able to provide you high quality care today.  You may receive a patient satisfaction survey over the next few weeks. We would appreciate your feedback and comments so that we may continue to improve ourselves and the health of our patients.

## 2020-10-16 NOTE — Progress Notes (Signed)
[facility] [address] [place]. [place] [address] [phone]       STROKE FOLLOW UP NOTE  [name] Date of Birth:  1950/03/09 Medical Record Number:  [id number]   Reason for visit: hospital stroke f/u; hx of TIA and complicated migraine    CHIEF COMPLAINT:  Chief Complaint  Patient presents with   Follow-up    Rm 2 with spouse Jacqueline [name]  Pt is well and stable.      HPI:  Today, 10/16/2020, Jacqueline [name] returns for hospital follow-up accompanied by her husband, Jacqueline [name].  Presented on 09/12/2020 with left-sided numbness, weakness and vertiginous symptoms.  Evaluated by Dr. Leonie Man for right parietal subcortical stroke likely due to small vessel disease. CTA head/neck negative LVO; mild b/l carotid atherosclerosis.  Carotid duplex right ICA 40 to 59% stenosis.  LDL 134.  A1c 7.9.  Recommended DAPT for 3 weeks then Plavix alone.  Statin switched from atorvastatin '80mg'$  to Crestor 40 mg daily -recommend consideration of PCSK9 inhibitor outpatient.  Blood pressure stable. Therapy eval recommended outpatient PT for residual left foot drop and gait impairment.  Overall stable since discharge.  Denies new stroke/TIA symptoms.  Denies residual stroke deficits. Reports increased depression/anxiety since stroke with fear of having additional strokes. PCP trialed Prozac but concern of causing lip swelling therefore d/c'd. Lip swelling since resolved. Also c/o insomnia and snoring. Denies witnessed apneas or daytime fatigue. No prior sleep study completed. Completed 3 weeks DAPT - Plavix d/c'd by PCP and remained on aspirin '81mg'$  daily as well as Crestor 40 mg daily.  Blood pressure today 140/82. Glucose levels usually <140s routinely monitored by endocrinology.  Remains on topiramate 50 mg nightly for migraine prophylaxis. No further concerns at this time.     Pertinent imaging  09/12/20 CT Angio Head and Neck W WO IV Contrast No intracranial large vessel occlusion.  Atherosclerotic disease at both carotid bifurcation and ICA bulb regions. 40% stenosis in the right ICA bulb. 20% stenosis in the left ICA bulb.  09/13/2020 carotid duplex Right carotid: 40 to 59% stenosis Left carotid: 1 to 39% stenosis Vertebrals: Bilateral antegrade flow   09/13/20 MRI Brain WO IV Contrast 1. Punctate foci of acute ischemia within the medial right frontal lobe. No hemorrhage or mass effect. ( Per personal read, stroke appears to be in the parietal lobe)  2. Multiple old small vessel infarcts of the right corona radiata.   09/13/20 Echocardiogram Complete  EF 55 to 60%, mild left ventricular hypertrophy     History provided for reference purposes only Update 04/16/2020 Jacqueline [name]: Jacqueline [name] returns for 72-monthscheduled follow-up visit.  Remains on topiramate 50 mg nightly for migraine prophylaxis tolerating without side effects Does report COVID-19 positive in January with worsening of migraines but have been slowly improving and reports to mild headaches over the past 2 weeks.  She also reports post Covid brain fog and fatigue  She does plan on undergoing cataract surgery L eye shortly, underwent R eye procedure in November   Denies new or reoccurring stroke/TIA symptoms Reports compliance on aspirin '81mg'$  daily -denies side effects Blood pressure today 143/85 Recent A1c 7.2 ( prior 6.9) -routinely follows with endocrinology Lipid panel 04/02/2020 LDL 107 (prior 49 11/2018) obtained by PCP - does report stopping atorvastatin but unsure reason or when this was stopped -she believes possibly due to running out of refill  No further concerns at this time  Update 10/18/2019 Jacqueline [name]: Ms. JDruschelreturns for follow-up regarding TIA versus  complicated migraine Currently on Topamax 50 mg nightly for migraine prevention tolerating dosage well without side effects She has not experienced any reoccurring migraine or complicated migraine symptoms or TIA symptoms She does report over the past month,  she has been experiencing R upper orbital sharp "ping" type pain lasting for only a few seconds and then subsiding occurring 3-4 times per week.  She denies any visual changes associated.  Does plan on undergoing cataract procedure on the right eye scheduled on 9/27 with symptoms of blurred vision and difficulty seeing at night.  She does report history of allergies and at times can have sinus pressure/congestion.  She is not currently on allergy medication. Previously referred to vestibular rehab for BPPV with improvement after Epley maneuver done by PT and did have recent episode of vertigo which subsided after performing Epley maneuver at home.  She has not had any reoccurring symptoms. Remains on aspirin 81 mg daily and atorvastatin 80 mg daily without side effects Blood pressure today 140/74 Glucose levels stable per patient currently managed by endocrinology She is concerned regarding weight gain but recently started participating in Silver sneakers program. No further concerns at this time  Update 03/22/2019: Jacqueline [name] is a 70 year old female who is being seen today for stroke follow-up.  She has been doing well from a stroke standpoint since prior visit.  She does endorse chronic history of vertigo appearing to present as BPPV with symptoms occurring with quick head movement or when lying down and changing positions.  She has not previously seen physical therapy for this concern.  No reoccurring headaches with ongoing use of Topamax 50 mg twice daily denies side effects.  Continues on aspirin and atorvastatin for secondary stroke prevention of side effects.  Blood pressure today stable.  Denies new or worsening stroke/TIA symptoms. Of note, patient does states she has been attempting to schedule appointment with PCP but due to illness with patient reporting cold-like symptoms she has been unable to be seen.  Recent COVID-19 testing negative.  Over the past couple days, she has been experiencing sharp  chest pain intermittently in the midsternum radiating towards the back and into her left arm.  This only lasts for a short duration and is worsened with increased exertion.  She denies any shortness of breath, dizziness or lightheadedness.  She has previously been seen by cardiology but has not been seen recently.  Update 11/16/2018: Jacqueline [name] is being seen today for follow-up.  Continues on aspirin without bleeding or bruising.  At prior visit, lipid panel obtained with LDL 126 (prior 119).  Recommended to increase atorvastatin dose from 40 mg to 80 mg daily.  She continues on atorvastatin 80 mg daily without myalgias.  Lipid panels have not been repeated since that time.  Blood pressure today 130/74.  Established care with endocrinology with adjustments made to medication regimen. Improvement of glucose levels per patient report.  She is also working on weight loss with prior office visit weight 208lbs and today's weight 193lb.  She has been working hard on improving diet and exercise.  Continues on Topamax 50 mg twice daily. Has not had any additional migraine with left arm numbness.  Will have occasional headaches which she believes is related to tension/stress but resolves without intervention.  No further concerns at this time.  Initial visit 09/01/2018: Since discharge, she has experienced 2 additional headaches which were preceded by left arm numbness/tingling.  Symptoms similar to prior episodes.  She has continued on Topamax 50  mg nightly with only minimal daytime fatigue but did not limit her overall functioning.   She does endorse approximately 2 weeks prior, she woke up in the morning with tingling on her nose and swelling of top lip.  She took Benadryl throughout the day and resolved within 24 hours.  Symptoms consistent with allergic reaction to unknown modality as she did not change medications at that time, denies eating a different type of food or use of lip product.  Completed 3 weeks DAPT  continues on aspirin alone without bleeding or bruising Previously taking atorvastatin 40 mg daily without side effects myalgias but has since run out of prescription Blood pressure 147/77 -does not routinely monitor at home Glucose levels uncontrolled and is in the process of finding new PCP versus establishing care with endocrinology due to continued difficulty with diabetic management No further concerns at this time  Hospital admission 07/13/2018: Jacqueline [name] is an 70 y.o. Caucasian female with PMH of hypertension, hyperlipidemia, obesity and diabetes who presented to [facility] on 07/14/2018 with 3 episode of left arm and face numbness tingling for the last 3 days.  Initial episode lasting only 3 minutes and then resolved but followed by headache with bifrontal throbbing was lasted approximately 3 minutes.  She attributed this to empty stomach.  Second episode occurred the next day lasting approximately 3 minutes and then accompanied by headache lasting approximately 3 minutes which occurred prior to lunch.  Third episode occurred while eating lunch with similar symptoms plus feeling heaviness of left arm which lasted about 5 minutes followed by mild brief headache.  Denies any prior history of migraine or headaches.  She does endorse having visual auras with flashing lights in the past.  Stroke work-up unremarkable and likely right brain TIA given risk factors versus complicated migraine due to headache following each episode.  Stroke work-up completed once transferred to [facility] ED.  MRI no acute stroke but did show small deep white matter infarcts.  Vessel imaging and 2D echo unremarkable.  LDL 119 and A1c 11.2.  Recommended DAPT for 3 weeks and aspirin alone.  HTN stable.  Initiated atorvastatin 40 mg daily for HLD management.  Recommended close PCP follow-up for uncontrolled DM.  Other stroke risk factors include advanced age, obesity and one episode of prior vision loss lasting 1 to 2 seconds  approximately 3 to 4 years ago.  As all symptoms resolved without residual deficit, discharged home in stable condition without therapy needs. She was discharged in the afternoon on 07/14/2018 but returned to [facility] ED that night due to recurrence of numbness in left shoulder and headache.  CT head unremarkable.  Was consulted by neurology and felt her symptoms were likely related to complex migraine as extensive stroke work-up unremarkable.  Discharged on Topamax and recommend follow-up with neurology outpatient.     ROS:   14 system review of systems performed and negative with exception of those listed in HPI PMH:  Past Medical History:  Diagnosis Date   Diabetes mellitus    HLD (hyperlipidemia)    Hypertension    Nephrolithiasis    Obese    TIA (transient ischemic attack) 2020    PSH:  Past Surgical History:  Procedure Laterality Date   ROTATOR CUFF REPAIR  2009   TUBAL LIGATION  1979    Social History:  Social History   Socioeconomic History   Marital status: Married    Spouse name: Not on file   Number of children:  4   Years of education: Not on file   Highest education level: Not on file  Occupational History   Occupation: Retired-Missionary  Tobacco Use   Smoking status: Never   Smokeless tobacco: Never  Vaping Use   Vaping Use: Never used  Substance and Sexual Activity   Alcohol use: No   Drug use: No   Sexual activity: Yes    Partners: Male    Birth control/protection: Post-menopausal  Other Topics Concern   Not on file  Social History Narrative   Retired Media planner   Husband is a Theme park manager- does some missions in [place]   2 great grandchildren (live in [place])   Married 48 years   4 children- 1 [place], 1  [place], 1 in [place], 1 at home   8 granchildren   Enjoys crafts   No pets   Social Determinants of Radio broadcast assistant Strain: Not on file  Food Insecurity: Not on file  Transportation Needs: Not on file  Physical  Activity: Not on file  Stress: Not on file  Social Connections: Not on file  Intimate Partner Violence: Not on file    Family History:  Family History  Problem Relation Age of Onset   Diabetes Father    Heart attack Father 1   Heart disease Mother        hx cabg at 42   Diabetes type II Brother    Parkinson's disease Sister    Brain cancer Sister    Pancreatic cancer Brother    Diabetes Mellitus II Sister    Diabetes Mellitus II Sister    CAD Sister    Diabetes Mellitus II Sister    Diabetes Mellitus II Sister    Osteoarthritis Daughter    AAA (abdominal aortic aneurysm) Daughter     Medications:   Current Outpatient Medications on File Prior to Visit  Medication Sig Dispense Refill   aspirin EC 81 MG EC tablet Take 1 tablet (81 mg total) by mouth daily. 21 tablet 0   BD PEN NEEDLE NANO 2ND GEN 32G X 4 MM MISC USE DAILY AS DIRECTED 100 each 2   famotidine (PEPCID) 20 MG tablet Take 1 tablet (20 mg total) by mouth 2 (two) times daily. 20 tablet 0   glucose blood (ACCU-CHEK GUIDE) test strip Use as instructed to test blood sugars 3 times daily E11.65 100 each 12   insulin detemir (LEVEMIR) 100 UNIT/ML FlexPen Inject 20 Units into the skin daily. 30 mL 3   Lancets (ACCU-CHEK MULTICLIX) lancets Use as instructed to test blood sugars 3 times daily E11.65 100 each 12   losartan (COZAAR) 25 MG tablet TAKE 1 TABLET (25 MG TOTAL) BY MOUTH DAILY. 30 tablet 3   metFORMIN (GLUCOPHAGE) 1000 MG tablet Take 1 tablet (1,000 mg total) by mouth 2 (two) times daily with a meal. 30 tablet 0   predniSONE (DELTASONE) 10 MG tablet Take 1 tablet (10 mg total) by mouth daily with breakfast. 5 tablet 0   rosuvastatin (CRESTOR) 40 MG tablet Take 1 tablet (40 mg total) by mouth at bedtime. 90 tablet 2   sodium polystyrene (KAYEXALATE) 15 GM/60ML suspension 60 ml po x 2 days 120 mL 0   topiramate (TOPAMAX) 50 MG tablet Take 1 tablet (50 mg total) by mouth at bedtime. 90 tablet 3   No current  facility-administered medications on file prior to visit.    Allergies:   Allergies  Allergen Reactions   Augmentin [Amoxicillin-Pot Clavulanate]  Other (See Comments)    Altered mental status Did it involve swelling of the face/tongue/throat, SOB, or low BP? Unknown Did it involve sudden or severe rash/hives, skin peeling, or any reaction on the inside of your mouth or nose? Unknown Did you need to seek medical attention at a hospital or doctor's office? Unknown When did it last happen? unk       If all above answers are "NO", may proceed with cephalosporin use.    Definity [Perflutren Lipid Microsphere] Other (See Comments)    Headache and facial flushing after Definity IVP given. No SOB or Chest pain,and Vital signs stable. Irven Baltimore, RN.   Novocain [Procaine] Other (See Comments)    Altered mental status   Promethazine Swelling    Tongue swelling   Prozac [Fluoxetine Hcl] Other (See Comments)    Lip swelling x 1 day after 1st dose     Physical Exam  Vitals:   10/16/20 0759  BP: 140/82  Pulse: 66  Weight: 192 lb (87.1 kg)  Height: '5\' 5"'$  (1.651 m)    Body mass index is 31.95 kg/m. No results found.  General: well developed, well nourished,  pleasant middle-age Caucasian female, seated, in no evident distress Head: head normocephalic and atraumatic.   Neck: supple with no carotid or supraclavicular bruits Cardiovascular: regular rate and rhythm, no murmurs Musculoskeletal: no deformity Skin:  no rash/petichiae Vascular:  Normal pulses all extremities   Neurologic Exam Mental Status: Awake and fully alert.  Fluent speech and language.  Oriented to place and time. Recent and remote memory intact. Attention span, concentration and fund of knowledge appropriate. Mood and affect appropriate.  Cranial Nerves: Pupils equal, briskly reactive to light. Extraocular movements full without nystagmus. Visual fields full to confrontation. Hearing intact. Facial sensation  intact. Face, tongue, palate moves normally and symmetrically.  Motor: Normal bulk and tone. Normal strength in all tested extremity muscles. Sensory.: intact to touch , pinprick , position and vibratory sensation.  Coordination: Rapid alternating movements normal in all extremities. Finger-to-nose and heel-to-shin performed accurately bilaterally. Gait and Station: Arises from chair without difficulty. Stance is normal. Gait demonstrates normal stride length and balance without use of assistive device Reflexes: 1+ and symmetric. Toes downgoing.        ASSESSMENT: Jacqueline [name] is a 70 y.o. year old female here with recent right parietal subcortical stroke likely secondary to small vessel disease on 09/12/2020 after presenting with left-sided numbness.  Hx of right brain TIA given risk factors versus complicated migraine on [id number] after presenting with 3 episodes of intermittent numbness/tingling in left arm and face x3 days followed by headache.  Returned to ED the evening after discharge due to return of symptoms with diagnosis of likely complicated migraine with Topamax initiated with benefit.  Vascular risk factors include HTN, HLD, uncontrolled DM, chronic BPPV, obesity, prior stroke on imaging and Covid + 02/2020.     PLAN:  Recent right parietal subcortical stroke History of prior stroke and possible TIA:  Discussed mood changes post stroke with depression/anxiety - recommended counseling/therapy but she would like to further consider. Advised to contact our office or PCP if interested. Denies interest in trialing a different medication at this time.  Advised to restart clopidogrel 75 mg daily and stop aspirin as on aspirin prior to recent stroke and contiue Crestor '40mg'$  daily for secondary stroke prevention.  Ensure close PCP and endocrinology follow-up for aggressive stroke risk factor management with  HTN with BP goal<130/90, stable monitored  by PCP HLD with LDL goal<70; recent LDL  134 - switched from atorvastatin 80 mg daily to Crestor 40 mg daily.  Repeat lipid panel today.  If remains uncontrolled, will plan on initiating PCSK9 inhibitor DM with A1c goal<7; recent A1c 7.9.  Routinely followed by endocrinology  Carotid stenosis, right, asymptomatic: Plan to repeat carotid duplex around 03/2021 for surveillance monitoring  At risk for sleep apnea: Referral placed to [facility] for further evaluation.  Complicated migraine:  Stable Continue Topamax 50 mg nightly for migraine prophylaxis -refill provided    Follow up in 4 months or call earlier if needed   CC:  [facility] provider: Dr.Sethi [name], NP    I spent 53 minutes of face-to-face and non-face-to-face time with patient and husband.  This included previsit chart review including review of recent hospitalization, lab review, study review, order entry, electronic health record documentation, patient education and discussion regarding stroke, etiology and worsening anxiety post stroke, hx of complicated migraine vs TIA, ongoing use of topiramate for migraine prophylaxis, importance of managing stroke risk factors and secondary stroke prevention measures and answered all other questions to patient and husbands satisfaction  Jacqueline [name], AGNP-BC  [facility] [address] [place], [place] [address]  Phone [phone] Fax [phone] Note: This document was prepared with digital dictation and possible smart phrase technology. Any transcriptional errors that result from this process are unintentional.

## 2020-10-17 LAB — LIPID PANEL
Chol/HDL Ratio: 2.7 ratio (ref 0.0–4.4)
Cholesterol, Total: 83 mg/dL — ABNORMAL LOW (ref 100–199)
HDL: 31 mg/dL — ABNORMAL LOW (ref >39)
LDL Chol Calc (NIH): 31 mg/dL (ref 0–99)
Triglycerides: 112 mg/dL (ref 0–149)
VLDL Cholesterol Cal: 21 mg/dL (ref 5–40)

## 2020-10-19 ENCOUNTER — Encounter: Payer: Self-pay | Admitting: Internal Medicine

## 2020-10-22 ENCOUNTER — Ambulatory Visit: Payer: Medicare HMO | Admitting: Adult Health

## 2020-10-23 NOTE — Progress Notes (Signed)
I agree with the above plan 

## 2020-10-24 ENCOUNTER — Other Ambulatory Visit: Payer: Self-pay | Admitting: *Deleted

## 2020-10-24 MED ORDER — METFORMIN HCL 1000 MG PO TABS: 1000.0000 mg | ORAL_TABLET | Freq: Two times a day (BID) | ORAL | 0 refills | Status: DC

## 2020-10-25 ENCOUNTER — Telehealth: Payer: Self-pay | Admitting: Family

## 2020-10-25 NOTE — Telephone Encounter (Signed)
Left message for patient to call back and schedule Medicare Annual Wellness Visit (AWV) in office.  ° °If not able to come in office, please offer to do virtually or by telephone.  Left office number and my jabber #[phone]. ° °Due for AWVI ° °Please schedule at anytime with Nurse Health Advisor. °  °

## 2020-11-07 ENCOUNTER — Other Ambulatory Visit: Payer: Self-pay

## 2020-11-07 ENCOUNTER — Ambulatory Visit (HOSPITAL_BASED_OUTPATIENT_CLINIC_OR_DEPARTMENT_OTHER)
Admission: RE | Admit: 2020-11-07 | Discharge: 2020-11-07 | Disposition: A | Discharge status: Home / Self Care | Payer: Medicare HMO | Source: Ambulatory Visit | Attending: Family | Admitting: Family

## 2020-11-07 ENCOUNTER — Ambulatory Visit (INDEPENDENT_AMBULATORY_CARE_PROVIDER_SITE_OTHER): Payer: Medicare HMO | Admitting: Family

## 2020-11-07 ENCOUNTER — Other Ambulatory Visit: Payer: Self-pay | Admitting: Family

## 2020-11-07 VITALS — BP 132/75 | HR 75 | Temp 98.2°F | Resp 16 | Wt 187.0 lb

## 2020-11-07 DIAGNOSIS — F419 Anxiety disorder, unspecified: Secondary | ICD-10-CM

## 2020-11-07 DIAGNOSIS — T7840XD Allergy, unspecified, subsequent encounter: Secondary | ICD-10-CM

## 2020-11-07 DIAGNOSIS — Z8673 Personal history of transient ischemic attack (TIA), and cerebral infarction without residual deficits: Secondary | ICD-10-CM

## 2020-11-07 DIAGNOSIS — S99922A Unspecified injury of left foot, initial encounter: Secondary | ICD-10-CM | POA: Insufficient documentation

## 2020-11-07 DIAGNOSIS — T7840XA Allergy, unspecified, initial encounter: Secondary | ICD-10-CM | POA: Insufficient documentation

## 2020-11-07 DIAGNOSIS — M7732 Calcaneal spur, left foot: Secondary | ICD-10-CM | POA: Diagnosis not present

## 2020-11-07 DIAGNOSIS — F32A Depression, unspecified: Secondary | ICD-10-CM | POA: Diagnosis not present

## 2020-11-07 HISTORY — DX: Allergy, unspecified, initial encounter: T78.40XA

## 2020-11-07 NOTE — Progress Notes (Signed)
Subjective:   By signing my name below, I, Shehryar Baig, attest that this documentation has been prepared under the direction and in the presence of [name] NP. 11/07/2020    Patient ID: Jacqueline [name], female    DOB: [birth year], 70 y.o.   MRN: [id number]  Chief Complaint  Patient presents with   Depression    Had to dc prozac due to allergic reaction, "feeling much better"     Depression        Myalgias: Left great toe. Patient is in today for a office visit.  Bloating/Constipation- She complains of bloating and constipation for the past couple of weeks.  Great toe- She complains of pain in her left great toe. It worsens after bumping it against something. She tripped while walking up the stairs and stubbed her great toe.  Prozac- She was prescribed Prozac during her last visit and reports having an allergic reaction while taking it. Her lips were swollen for a couple of days. She has since stopped taking it and her symptoms resolved.  She reports her mood is doing well during this visit. Plavix- She continues taking 75 mg Plavix daily PO and reports no new issues while taking it. Blood pressure- Her blood pressure is doing well during this visit. She continues taking 25 mg losartan daily PO and report no new issues while taking it.   BP Readings from Last 3 Encounters:  11/07/20 132/75  10/16/20 140/82  10/11/20 (!) 154/60   Pulse Readings from Last 3 Encounters:  11/07/20 75  10/16/20 66  10/11/20 73   Blood sugar- Her last a1c is elevated. She is changing her diet and participating in exercise to reduce her blood sugar. She continues taking 1000 mg metformin daily PO and reports no new issues while taking it.   Lab Results  Component Value Date   HGBA1C 7.9 (H) 09/13/2020   Immunizations- She has recently received her flu vaccine.   Health Maintenance Due  Topic Date Due   Hepatitis C Screening  Never done   COLONOSCOPY (Pts 45-83yrs Insurance coverage  will need to be confirmed)  Never done   MAMMOGRAM  Never done   Zoster Vaccines- Shingrix (1 of 2) Never done   DEXA SCAN  Never done   COVID-19 Vaccine (3 - Booster for Moderna series) 10/19/2019   FOOT EXAM  05/02/2020   OPHTHALMOLOGY EXAM  09/04/2020    Past Medical History:  Diagnosis Date   Diabetes mellitus    HLD (hyperlipidemia)    Hypertension    Nephrolithiasis    Obese    TIA (transient ischemic attack) 2020    Past Surgical History:  Procedure Laterality Date   ROTATOR CUFF REPAIR  2009   TUBAL LIGATION  1979    Family History  Problem Relation Age of Onset   Diabetes Father    Heart attack Father 71   Heart disease Mother        hx cabg at 47   Diabetes type II Brother    Parkinson's disease Sister    Brain cancer Sister    Pancreatic cancer Brother    Diabetes Mellitus II Sister    Diabetes Mellitus II Sister    CAD Sister    Diabetes Mellitus II Sister    Diabetes Mellitus II Sister    Osteoarthritis Daughter    AAA (abdominal aortic aneurysm) Daughter     Social History   Socioeconomic History   Marital status: Married  Spouse name: Not on file   Number of children: 4   Years of education: Not on file   Highest education level: Not on file  Occupational History   Occupation: Retired-Missionary  Tobacco Use   Smoking status: Never   Smokeless tobacco: Never  Vaping Use   Vaping Use: Never used  Substance and Sexual Activity   Alcohol use: No   Drug use: No   Sexual activity: Yes    Partners: Male    Birth control/protection: Post-menopausal  Other Topics Concern   Not on file  Social History Narrative   Retired Media planner   Husband is a Theme park manager- does some missions in [place]   2 great grandchildren (live in [place])   Married 64 years   4 children- 1 [place], 1  [place], 1 in [place], 1 at home   8 granchildren   Enjoys crafts   No pets   Social Determinants of Radio broadcast assistant Strain: Not  on file  Food Insecurity: Not on file  Transportation Needs: Not on file  Physical Activity: Not on file  Stress: Not on file  Social Connections: Not on file  Intimate Partner Violence: Not on file    Outpatient Medications Prior to Visit  Medication Sig Dispense Refill   BD PEN NEEDLE NANO 2ND GEN 32G X 4 MM MISC USE DAILY AS DIRECTED 100 each 2   clopidogrel (PLAVIX) 75 MG tablet Take 1 tablet (75 mg total) by mouth daily. 90 tablet 3   glucose blood (ACCU-CHEK GUIDE) test strip Use as instructed to test blood sugars 3 times daily E11.65 100 each 12   insulin detemir (LEVEMIR) 100 UNIT/ML FlexPen Inject 20 Units into the skin daily. 30 mL 3   Lancets (ACCU-CHEK MULTICLIX) lancets Use as instructed to test blood sugars 3 times daily E11.65 100 each 12   losartan (COZAAR) 25 MG tablet TAKE 1 TABLET (25 MG TOTAL) BY MOUTH DAILY. 30 tablet 3   metFORMIN (GLUCOPHAGE) 1000 MG tablet Take 1 tablet (1,000 mg total) by mouth 2 (two) times daily with a meal. 30 tablet 0   rosuvastatin (CRESTOR) 40 MG tablet Take 1 tablet (40 mg total) by mouth at bedtime. 90 tablet 2   topiramate (TOPAMAX) 50 MG tablet Take 1 tablet (50 mg total) by mouth at bedtime. 90 tablet 3   aspirin EC 81 MG EC tablet Take 1 tablet (81 mg total) by mouth daily. 21 tablet 0   famotidine (PEPCID) 20 MG tablet Take 1 tablet (20 mg total) by mouth 2 (two) times daily. 20 tablet 0   predniSONE (DELTASONE) 10 MG tablet Take 1 tablet (10 mg total) by mouth daily with breakfast. 5 tablet 0   sodium polystyrene (KAYEXALATE) 15 GM/60ML suspension 60 ml po x 2 days 120 mL 0   No facility-administered medications prior to visit.    Allergies  Allergen Reactions   Augmentin [Amoxicillin-Pot Clavulanate] Other (See Comments)    Altered mental status Did it involve swelling of the face/tongue/throat, SOB, or low BP? Unknown Did it involve sudden or severe rash/hives, skin peeling, or any reaction on the inside of your mouth or nose?  Unknown Did you need to seek medical attention at a hospital or doctor's office? Unknown When did it last happen? unk       If all above answers are "NO", may proceed with cephalosporin use.    Definity [Perflutren Lipid Microsphere] Other (See Comments)    Headache and facial  flushing after Definity IVP given. No SOB or Chest pain,and Vital signs stable. Irven Baltimore, RN.   Novocain [Procaine] Other (See Comments)    Altered mental status   Promethazine Swelling    Tongue swelling   Prozac [Fluoxetine Hcl] Other (See Comments)    Lip swelling x 1 day after 1st dose    Review of Systems  Gastrointestinal:  Positive for constipation.       (+)bloating  Musculoskeletal:  Positive for joint pain (left great toe). Myalgias: Left great toe.     Objective:    Physical Exam Constitutional:      General: She is not in acute distress.    Appearance: Normal appearance. She is not ill-appearing.  HENT:     Head: Normocephalic and atraumatic.     Right Ear: External ear normal.     Left Ear: External ear normal.  Eyes:     Extraocular Movements: Extraocular movements intact.     Pupils: Pupils are equal, round, and reactive to light.  Cardiovascular:     Rate and Rhythm: Normal rate and regular rhythm.     Heart sounds: Normal heart sounds. No murmur heard.   No gallop.  Pulmonary:     Effort: Pulmonary effort is normal. No respiratory distress.     Breath sounds: Normal breath sounds. No wheezing or rales.  Musculoskeletal:     Comments: Tenderness in left great toe  Skin:    General: Skin is warm and dry.  Neurological:     Mental Status: She is alert and oriented to person, place, and time.  Psychiatric:        Behavior: Behavior normal.    BP 132/75 (BP Location: Left Arm, Patient Position: Sitting, Cuff Size: Small)   Pulse 75   Temp 98.2 F (36.8 C) (Oral)   Resp 16   Wt 187 lb (84.8 kg)   SpO2 99%   BMI 31.12 kg/m  Wt Readings from Last 3 Encounters:  11/07/20  187 lb (84.8 kg)  10/16/20 192 lb (87.1 kg)  10/11/20 190 lb 6.4 oz (86.4 kg)       Assessment & Plan:   Problem List Items Addressed This Visit       Unprioritized   Injury of left great toe - Primary   Relevant Orders   DG Foot Complete Left   History of CVA (cerebrovascular accident)    Clinically stable. Continue plavix, statin for secondary prevention.       Anxiety and depression    Stable off of medication. Monitor.       Allergic drug reaction    Resolved. Avoid SSRI's in the future.        No orders of the defined types were placed in this encounter.   I, [name] NP, personally preformed the services described in this documentation.  All medical record entries made by the scribe were at my direction and in my presence.  I have reviewed the chart and discharge instructions (if applicable) and agree that the record reflects my personal performance and is accurate and complete. 11/07/2020   I,Shehryar Baig,acting as a scribe for Nance Pear, NP.,have documented all relevant documentation on the behalf of Nance Pear, NP,as directed by  Nance Pear, NP while in the presence of Nance Pear, NP.   Nance Pear, NP

## 2020-11-07 NOTE — Assessment & Plan Note (Signed)
Stable off of medication. Monitor.

## 2020-11-07 NOTE — Assessment & Plan Note (Signed)
Resolved. Avoid SSRI's in the future.

## 2020-11-07 NOTE — Assessment & Plan Note (Signed)
Clinically stable. Continue plavix, statin for secondary prevention.

## 2020-11-21 ENCOUNTER — Other Ambulatory Visit (HOSPITAL_BASED_OUTPATIENT_CLINIC_OR_DEPARTMENT_OTHER): Payer: Self-pay | Admitting: Family

## 2020-11-21 DIAGNOSIS — Z1231 Encounter for screening mammogram for malignant neoplasm of breast: Secondary | ICD-10-CM

## 2020-11-26 ENCOUNTER — Other Ambulatory Visit: Payer: Self-pay | Admitting: Internal Medicine

## 2020-11-28 ENCOUNTER — Other Ambulatory Visit: Payer: Self-pay

## 2020-11-28 ENCOUNTER — Ambulatory Visit (INDEPENDENT_AMBULATORY_CARE_PROVIDER_SITE_OTHER): Payer: Medicare HMO

## 2020-11-28 DIAGNOSIS — Z1231 Encounter for screening mammogram for malignant neoplasm of breast: Secondary | ICD-10-CM

## 2020-12-07 ENCOUNTER — Other Ambulatory Visit: Payer: Self-pay

## 2020-12-07 ENCOUNTER — Ambulatory Visit (INDEPENDENT_AMBULATORY_CARE_PROVIDER_SITE_OTHER): Payer: Medicare HMO | Admitting: Family

## 2020-12-07 VITALS — BP 131/63 | HR 66 | Temp 98.1°F | Resp 16 | Wt 183.0 lb

## 2020-12-07 DIAGNOSIS — L989 Disorder of the skin and subcutaneous tissue, unspecified: Secondary | ICD-10-CM | POA: Insufficient documentation

## 2020-12-07 DIAGNOSIS — S99922A Unspecified injury of left foot, initial encounter: Secondary | ICD-10-CM

## 2020-12-07 NOTE — Progress Notes (Signed)
Subjective:   By signing my name below, I, [name], attest that this documentation has been prepared under the direction and in the presence of [name], NP, 12/07/2020   Patient ID: Jacqueline [name], female    DOB: 03-22-1950, 70 y.o.   MRN: [id number]  Chief Complaint  Patient presents with   Toe Injury    Here for 1 month follow up    HPI Patient is in today for an office visit.  Toe pain: She notes that she has relief in her big toe. She had an x-ray done during her last visit and the results were normal but found arthritis in her toe. She does not note anymore pain.  Ear pain: She notes ear pain that is prevalent in the winter months with the wind.  Skin concerns: She notes of lesions on her chest and nose. She is interested in having them looked at by a dermatologist.   Health Maintenance Due  Topic Date Due   Hepatitis C Screening  Never done   COLONOSCOPY (Pts 45-48yrs Insurance coverage will need to be confirmed)  Never done   Zoster Vaccines- Shingrix (1 of 2) Never done   DEXA SCAN  Never done   Pneumonia Vaccine 49+ Years old (2 - PPSV23 if available, else PCV20) 07/18/2018   COVID-19 Vaccine (2 - Moderna series) 06/16/2019   FOOT EXAM  05/02/2020   OPHTHALMOLOGY EXAM  09/04/2020    Past Medical History:  Diagnosis Date   Diabetes mellitus    HLD (hyperlipidemia)    Hypertension    Nephrolithiasis    Obese    TIA (transient ischemic attack) 2020    Past Surgical History:  Procedure Laterality Date   ROTATOR CUFF REPAIR  2009   TUBAL LIGATION  1979    Family History  Problem Relation Age of Onset   Diabetes Father    Heart attack Father 75   Heart disease Mother        hx cabg at 3   Diabetes type II Brother    Parkinson's disease Sister    Brain cancer Sister    Pancreatic cancer Brother    Diabetes Mellitus II Sister    Diabetes Mellitus II Sister    CAD Sister    Diabetes Mellitus II Sister    Diabetes Mellitus II  Sister    Osteoarthritis Daughter    AAA (abdominal aortic aneurysm) Daughter     Social History   Socioeconomic History   Marital status: Married    Spouse name: Not on file   Number of children: 4   Years of education: Not on file   Highest education level: Not on file  Occupational History   Occupation: Retired-Missionary  Tobacco Use   Smoking status: Never   Smokeless tobacco: Never  Vaping Use   Vaping Use: Never used  Substance and Sexual Activity   Alcohol use: No   Drug use: No   Sexual activity: Yes    Partners: Male    Birth control/protection: Post-menopausal  Other Topics Concern   Not on file  Social History Narrative   Retired Media planner   Husband is a Theme park manager- does some missions in [place]   2 great grandchildren (live in [place])   Married 29 years   4 children- 1 [place], 1  [place], 1 in [place], 1 at home   8 granchildren   Enjoys crafts   No pets   Social Determinants of Engineer, drilling  Resource Strain: Not on file  Food Insecurity: Not on file  Transportation Needs: Not on file  Physical Activity: Not on file  Stress: Not on file  Social Connections: Not on file  Intimate Partner Violence: Not on file    Outpatient Medications Prior to Visit  Medication Sig Dispense Refill   ACCU-CHEK GUIDE test strip USE AS INSTRUCTED TO TEST BLOOD SUGARS 3 TIMES DAILY E11.65 100 strip 2   BD PEN NEEDLE NANO 2ND GEN 32G X 4 MM MISC USE DAILY AS DIRECTED 100 each 2   clopidogrel (PLAVIX) 75 MG tablet Take 1 tablet (75 mg total) by mouth daily. 90 tablet 3   insulin detemir (LEVEMIR) 100 UNIT/ML FlexPen Inject 20 Units into the skin daily. 30 mL 3   Lancets (ACCU-CHEK MULTICLIX) lancets Use as instructed to test blood sugars 3 times daily E11.65 100 each 12   losartan (COZAAR) 25 MG tablet TAKE 1 TABLET (25 MG TOTAL) BY MOUTH DAILY. 30 tablet 3   metFORMIN (GLUCOPHAGE) 1000 MG tablet TAKE 1 TABLET (1,000 MG TOTAL) BY MOUTH 2 (TWO)  TIMES DAILY WITH A MEAL. 60 tablet 1   rosuvastatin (CRESTOR) 40 MG tablet Take 1 tablet (40 mg total) by mouth at bedtime. 90 tablet 2   topiramate (TOPAMAX) 50 MG tablet Take 1 tablet (50 mg total) by mouth at bedtime. 90 tablet 3   No facility-administered medications prior to visit.    Allergies  Allergen Reactions   Augmentin [Amoxicillin-Pot Clavulanate] Other (See Comments)    Altered mental status Did it involve swelling of the face/tongue/throat, SOB, or low BP? Unknown Did it involve sudden or severe rash/hives, skin peeling, or any reaction on the inside of your mouth or nose? Unknown Did you need to seek medical attention at a hospital or doctor's office? Unknown When did it last happen? unk       If all above answers are "NO", may proceed with cephalosporin use.    Definity [Perflutren Lipid Microsphere] Other (See Comments)    Headache and facial flushing after Definity IVP given. No SOB or Chest pain,and Vital signs stable. Irven Baltimore, RN.   Novocain [Procaine] Other (See Comments)    Altered mental status   Promethazine Swelling    Tongue swelling   Prozac [Fluoxetine Hcl] Other (See Comments)    Lip swelling x 1 day after 1st dose    Review of Systems  HENT:  Positive for ear pain (with wind impact).   Musculoskeletal:        (-) toe pain  Skin:        (+) lesion on chest and nose      Objective:    Physical Exam Constitutional:      General: She is not in acute distress.    Appearance: Normal appearance. She is not ill-appearing.  HENT:     Head: Normocephalic and atraumatic.     Right Ear: Tympanic membrane, ear canal and external ear normal.     Left Ear: Tympanic membrane, ear canal and external ear normal.     Ears:     Comments: (+) mild wax buildup in right ear Eyes:     Extraocular Movements: Extraocular movements intact.     Pupils: Pupils are equal, round, and reactive to light.  Cardiovascular:     Rate and Rhythm: Normal rate and  regular rhythm.     Heart sounds: Normal heart sounds. No murmur heard.   No gallop.  Pulmonary:  Effort: Pulmonary effort is normal. No respiratory distress.     Breath sounds: Normal breath sounds. No wheezing or rales.  Lymphadenopathy:     Cervical: No cervical adenopathy.  Skin:    General: Skin is warm and dry.     Findings: Lesion (Dry raised lesion on anterior chest and nose) present.  Neurological:     Mental Status: She is alert and oriented to person, place, and time.  Psychiatric:        Behavior: Behavior normal.        Judgment: Judgment normal.    BP 131/63 (BP Location: Right Arm, Patient Position: Sitting, Cuff Size: Large)   Pulse 66   Temp 98.1 F (36.7 C) (Oral)   Resp 16   Wt 183 lb (83 kg)   SpO2 99%   BMI 30.45 kg/m  Wt Readings from Last 3 Encounters:  12/07/20 183 lb (83 kg)  11/07/20 187 lb (84.8 kg)  10/16/20 192 lb (87.1 kg)       Assessment & Plan:   Problem List Items Addressed This Visit       Unprioritized   Skin lesion - Primary    Concerning lesions on chest and nose.Will refer to dermatology for evaluation/skin check.       Relevant Orders   Ambulatory referral to Dermatology   Injury of left great toe    Resolved. X-ray noted mild arthritis changes only.       No orders of the defined types were placed in this encounter.   I, [name], NP, personally preformed the services described in this documentation.  All medical record entries made by the scribe were at my direction and in my presence.  I have reviewed the chart and discharge instructions (if applicable) and agree that the record reflects my personal performance and is accurate and complete. 12/07/2020  I,[name],acting as a Education administrator for Nance Pear, NP.,have documented all relevant documentation on the behalf of Nance Pear, NP,as directed by  Nance Pear, NP while in the presence of Nance Pear, NP.  Nance Pear, NP

## 2020-12-07 NOTE — Assessment & Plan Note (Signed)
Resolved. X-ray noted mild arthritis changes only.

## 2020-12-07 NOTE — Assessment & Plan Note (Signed)
Concerning lesions on chest and nose.Will refer to dermatology for evaluation/skin check.

## 2020-12-21 ENCOUNTER — Telehealth: Payer: Self-pay | Admitting: Family

## 2020-12-21 NOTE — Telephone Encounter (Signed)
[facility] is not in network with her insurance. Can you please forward to another dermatology office?

## 2020-12-24 ENCOUNTER — Encounter: Payer: Self-pay | Admitting: Internal Medicine

## 2020-12-24 ENCOUNTER — Ambulatory Visit: Payer: Medicare HMO | Admitting: Internal Medicine

## 2020-12-24 ENCOUNTER — Other Ambulatory Visit: Payer: Self-pay

## 2020-12-24 VITALS — BP 126/80 | HR 65 | Ht 65.0 in | Wt 182.0 lb

## 2020-12-24 DIAGNOSIS — E1142 Type 2 diabetes mellitus with diabetic polyneuropathy: Secondary | ICD-10-CM

## 2020-12-24 DIAGNOSIS — E1159 Type 2 diabetes mellitus with other circulatory complications: Secondary | ICD-10-CM | POA: Diagnosis not present

## 2020-12-24 DIAGNOSIS — Z794 Long term (current) use of insulin: Secondary | ICD-10-CM | POA: Diagnosis not present

## 2020-12-24 LAB — POCT GLYCOSYLATED HEMOGLOBIN (HGB A1C): Hemoglobin A1C: 6.9 % — AB (ref 4.0–5.6)

## 2020-12-24 LAB — GLUCOSE, POCT (MANUAL RESULT ENTRY): POC Glucose: 169 mg/dl — AB (ref 70–99)

## 2020-12-24 MED ORDER — INSULIN DETEMIR 100 UNIT/ML FLEXPEN: 28.0000 [IU] | PEN_INJECTOR | Freq: Every day | SUBCUTANEOUS | 3 refills | Status: DC

## 2020-12-24 MED ORDER — METFORMIN HCL 1000 MG PO TABS: 1000.0000 mg | ORAL_TABLET | Freq: Two times a day (BID) | ORAL | 1 refills | Status: DC

## 2020-12-24 MED ORDER — METFORMIN HCL 1000 MG PO TABS: 1000.0000 mg | ORAL_TABLET | Freq: Two times a day (BID) | ORAL | 3 refills | Status: DC

## 2020-12-24 NOTE — Progress Notes (Signed)
Name: Jacqueline [name]  Age/ Sex: 70 y.o., female   MRN/ DOB: [id number], 04-06-1950     PCP: [name], NP   Reason for Endocrinology Evaluation: Type 2 Diabetes Mellitus  Initial Endocrine Consultative Visit: 10/04/2018    PATIENT IDENTIFIER: [name] is a 70 y.o. female with a past medical history of HTN, DM, HLD and migraine headaches. The patient has followed with Endocrinology clinic since 10/04/2018 for consultative assistance with management of her diabetes.  DIABETIC HISTORY:  Jacqueline [name] was diagnosed with DM many years ago, she has been on metformin and glipizide for years, insulin started 07/2018. She was prescribed Victoza at some point but she did not start it as her sister had a bad experience with it. Her hemoglobin A1c has ranged from 7.0's % , peaking at 11.2 %in 2020.    On her initial visit to our clinic, her A1c was 11.2%. She was on Levemir and metformin . We added [place] and stopped Glipizide. Due to hematuria she stopped Jardiance    Rybelsus started 10/2019 but did not start by her next visit in 03/2020 due to planning on eye sx and did not want to start before then? , she was advised to restart by 03/2020  SUBJECTIVE: stress   During the last visit (09/24/2020): A1c 7.9 %.  We increased Levemir  and Metformin      Today (12/24/2020): [name] is here for a follow up on diabetes.  She checks her blood sugars sporadically . The patient has not had hypoglycemic episodes since the last clinic visit.   She has made lifestyle changes and lost weight  She is having a trip to [place] with her daughter    Had a TIA in 09/2020, rosuvastatin increased to 40 mg     [place]:  Levemir 25  units daily  Metformin 1000 mg Twice a day     METER DOWNLOAD SUMMARY: Did not bring       DIABETIC COMPLICATIONS: Microvascular complications:  Neuropathy Denies: retinopathy,  CKD Last eye exam: Completed 2021      Macrovascular  complications:  TIA ( 09/7865) and 09/2020 Denies: CAD, PVD    HISTORY:  Past Medical History:  Past Medical History:  Diagnosis Date   Diabetes mellitus    HLD (hyperlipidemia)    Hypertension    Nephrolithiasis    Obese    TIA (transient ischemic attack) 2020   Past Surgical History:  Past Surgical History:  Procedure Laterality Date   ROTATOR CUFF REPAIR  2009   TUBAL LIGATION  1979   Social History:  reports that she has never smoked. She has never used smokeless tobacco. She reports that she does not drink alcohol and does not use drugs. Family History:  Family History  Problem Relation Age of Onset   Diabetes Father    Heart attack Father 46   Heart disease Mother        hx cabg at 44   Diabetes type II Brother    Parkinson's disease Sister    Brain cancer Sister    Pancreatic cancer Brother    Diabetes Mellitus II Sister    Diabetes Mellitus II Sister    CAD Sister    Diabetes Mellitus II Sister    Diabetes Mellitus II Sister    Osteoarthritis Daughter    AAA (abdominal aortic aneurysm) Daughter      HOME MEDICATIONS: Allergies as of 12/24/2020  Reactions   Augmentin [amoxicillin-pot Clavulanate] Other (See Comments)   Altered mental status Did it involve swelling of the face/tongue/throat, SOB, or low BP? Unknown Did it involve sudden or severe rash/hives, skin peeling, or any reaction on the inside of your mouth or nose? Unknown Did you need to seek medical attention at a hospital or doctor's office? Unknown When did it last happen? unk       If all above answers are "NO", may proceed with cephalosporin use.   Definity [perflutren Lipid Microsphere] Other (See Comments)   Headache and facial flushing after Definity IVP given. No SOB or Chest pain,and Vital signs stable. Irven [place], RN.   Novocain [procaine] Other (See Comments)   Altered mental status   Promethazine Swelling   Tongue swelling   Prozac [fluoxetine Hcl] Other (See Comments)    Lip swelling x 1 day after 1st dose        Medication List        Accurate as of December 24, 2020 10:25 AM. If you have any questions, ask your nurse or doctor.          Accu-Chek Guide test strip Generic drug: glucose blood USE AS INSTRUCTED TO TEST BLOOD SUGARS 3 TIMES DAILY E11.65   accu-chek multiclix lancets Use as instructed to test blood sugars 3 times daily E11.65   BD Pen Needle Nano 2nd Gen 32G X 4 MM Misc Generic drug: Insulin Pen Needle USE DAILY AS DIRECTED   clopidogrel 75 MG tablet Commonly known as: PLAVIX Take 1 tablet (75 mg total) by mouth daily.   insulin detemir 100 UNIT/ML FlexPen Commonly known as: LEVEMIR Inject 20 Units into the skin daily.   losartan 25 MG tablet Commonly known as: COZAAR TAKE 1 TABLET (25 MG TOTAL) BY MOUTH DAILY.   metFORMIN 1000 MG tablet Commonly known as: GLUCOPHAGE TAKE 1 TABLET (1,000 MG TOTAL) BY MOUTH 2 (TWO) TIMES DAILY WITH A MEAL.   rosuvastatin 40 MG tablet Commonly known as: CRESTOR Take 1 tablet (40 mg total) by mouth at bedtime.   topiramate 50 MG tablet Commonly known as: TOPAMAX Take 1 tablet (50 mg total) by mouth at bedtime.         OBJECTIVE:   Vital Signs: BP 126/80 (BP Location: Left Arm, Patient Position: Sitting, Cuff Size: Small)   Pulse 65   Ht 5\' 5"  (1.651 m)   Wt 182 lb (82.6 kg)   SpO2 99%   BMI 30.29 kg/m   Wt Readings from Last 3 Encounters:  12/24/20 182 lb (82.6 kg)  12/07/20 183 lb (83 kg)  11/07/20 187 lb (84.8 kg)     Exam: General: Pt appears well and is in NAD  Lungs: Clear with good BS bilat with no rales, rhonchi, or wheezes  Heart: RRR   Extremities: No pretibial edema.  Neuro: MS is good with appropriate affect, pt is alert and Ox3       DM foot exam: 09/24/2020   The skin of the feet is intact without sores or ulcerations. The pedal pulses are 2+ on right and 2+ on left. The sensation is intact to a screening 5.07, 10 gram monofilament  bilaterally    DATA REVIEWED:  Lab Results  Component Value Date   HGBA1C 6.9 (A) 12/24/2020   HGBA1C 7.9 (H) 09/13/2020   HGBA1C 7.2 (A) 04/02/2020   Lab Results  Component Value Date   MICROALBUR 1.4 04/02/2020   LDLCALC 31 10/16/2020   CREATININE 1.05 10/09/2020  Lab Results  Component Value Date   CHOL 83 (L) 10/16/2020   HDL 31 (L) 10/16/2020   LDLCALC 31 10/16/2020   TRIG 112 10/16/2020   CHOLHDL 2.7 10/16/2020         ASSESSMENT / PLAN / RECOMMENDATIONS:   1) Type 2 Diabetes Mellitus, Optimally controlled, With neuropathic and Macrovascular  complications - Most recent A1c of 6.9 %. Goal A1c < 7.0 %.     -Praised the pt on improved glycemic control and weight loss  - In anticipation of her trip to [place] and a fasting BG today of 169 mg/dL I am going to increase insulin as below  - She may reduce Levemir back to 25 units after her tip if fasting BG consistently < 100 mg/dL  - Based on CT scan in 12/2018 showed evidence of left renal nephrolithiasis, so I have asked her to hold off on the Jardiance, due to the increased risk of UTI , will reconsider if the renal stone resolves   MEDICATIONS: Increase  Levemir 28 units daily  Continue Metformin 1000 mg BID    EDUCATION / INSTRUCTIONS: BG monitoring instructions: Patient is instructed to check her blood sugars 2 times a day, fasting and bedtime . Call [facility] if: BG persistently < 70  I reviewed the Rule of 15 for the treatment of hypoglycemia in detail with the patient. Literature supplied.     F/U in 4 months   Signed electronically by: Mack Guise, MD  [facility]  [facility] [address] [place], [place] [address] Phone: [phone] FAX: [phone]   CC: [name], [address] [place] [address] Phone: [phone]  Fax: [phone]  Return to Endocrinology clinic as below: Future  Appointments  Date Time Provider [place]  01/21/2021  9:00 AM Dohmeier, Asencion Partridge, MD [facility] None  03/06/2021  8:45 AM Frann Rider, NP [facility] None  06/07/2021  9:20 AM [name], NP [id number]

## 2020-12-24 NOTE — Patient Instructions (Addendum)
-   Keep up the Good Work !  - Continue Metformin 1000 mg , 1 tablet Twice a day  - Increase Levemir 28 units daily - You may reduce this to 25 units after your trip IF your fasting sugars are less then 100 mg/dL   HOW TO TREAT LOW BLOOD SUGARS (Blood sugar LESS THAN 70 MG/DL) Please follow the RULE OF 15 for the treatment of hypoglycemia treatment (when your (blood sugars are less than 70 mg/dL)   STEP 1: Take 15 grams of carbohydrates when your blood sugar is low, which includes:  3-4 GLUCOSE TABS  OR 3-4 OZ OF JUICE OR REGULAR SODA OR ONE TUBE OF GLUCOSE GEL    STEP 2: RECHECK blood sugar in 15 MINUTES STEP 3: If your blood sugar is still low at the 15 minute recheck --> then, go back to STEP 1 and treat AGAIN with another 15 grams of carbohydrates.

## 2020-12-31 ENCOUNTER — Ambulatory Visit: Payer: Medicare HMO | Admitting: Family

## 2021-01-01 ENCOUNTER — Ambulatory Visit: Payer: Medicare HMO | Admitting: Family

## 2021-01-02 ENCOUNTER — Ambulatory Visit (INDEPENDENT_AMBULATORY_CARE_PROVIDER_SITE_OTHER): Payer: Medicare HMO | Admitting: Family Medicine

## 2021-01-02 ENCOUNTER — Other Ambulatory Visit: Payer: Self-pay

## 2021-01-02 ENCOUNTER — Encounter: Payer: Self-pay | Admitting: Family Medicine

## 2021-01-02 VITALS — BP 112/62 | HR 75 | Temp 97.7°F | Ht 65.0 in | Wt 183.0 lb

## 2021-01-02 DIAGNOSIS — M2550 Pain in unspecified joint: Secondary | ICD-10-CM

## 2021-01-02 DIAGNOSIS — M79604 Pain in right leg: Secondary | ICD-10-CM

## 2021-01-02 DIAGNOSIS — M79605 Pain in left leg: Secondary | ICD-10-CM

## 2021-01-02 DIAGNOSIS — E559 Vitamin D deficiency, unspecified: Secondary | ICD-10-CM | POA: Diagnosis not present

## 2021-01-02 MED ORDER — METHYLPREDNISOLONE ACETATE 40 MG/ML IJ SUSP
40.0000 mg | Freq: Once | INTRAMUSCULAR | Status: AC
  Administered 2021-01-02: 40 mg via INTRAMUSCULAR

## 2021-01-02 MED ORDER — PREDNISONE 20 MG PO TABS: 40.0000 mg | ORAL_TABLET | Freq: Every day | ORAL | 0 refills | Status: AC

## 2021-01-02 NOTE — Patient Instructions (Signed)
Ice/cold pack over area for 10-15 min twice daily.  Heat (pad or rice pillow in microwave) over affected area, 10-15 minutes twice daily.   OK to take Tylenol 1000 mg (2 extra strength tabs) or 975 mg (3 regular strength tabs) every 6 hours as needed.  Give [place] 2-3 business days to get the results of your labs back.   Let us know if you need anything.

## 2021-01-02 NOTE — Progress Notes (Signed)
Musculoskeletal Exam  Patient: Jacqueline [name] DOB: Oct 13, 1950  DOS: 01/02/2021  SUBJECTIVE:  Chief Complaint:   Chief Complaint  Patient presents with   Leg Pain    both    Jacqueline [name] is a 70 y.o.  female for evaluation and treatment of bilateral leg pain. Here w husband.   Onset:  3 weeks ago. No inj or change in activity. +hx of stroke affected in LLE. Location: various areas on both legs throughout.  Character:  aching  Progression of issue:  waxing and waning.  High heels relieve pain.  Associated symptoms: weakness Denies redness, bruising, rashes, recent illness, fevers, swelling.  Treatment: to date has been acetaminophen.   Sugars WNL when episodes happen. Episodes last for up to most of the day.  Neurovascular symptoms: no tingling  Past Medical History:  Diagnosis Date   Diabetes mellitus    HLD (hyperlipidemia)    Hypertension    Nephrolithiasis    Obese    TIA (transient ischemic attack) 2020    Objective: VITAL SIGNS: BP 112/62   Pulse 75   Temp 97.7 F (36.5 C) (Oral)   Ht 5\' 5"  (1.651 m)   Wt 183 lb (83 kg)   SpO2 97%   BMI 30.45 kg/m  Constitutional: Well formed, well developed. No acute distress. Thorax & Lungs: No accessory muscle use Musculoskeletal: b/l legs.   Normal active range of motion: yes.   Normal passive range of motion: yes Tenderness to palpation: yes over lateral ankle and ATFL No TTP over legs, SI jt or lumbar parasp msc b/l Deformity: no Ecchymosis: no Tests positive:  Tests negative: squeeze, ant drawer of ankles, Lachman's, Stine's, Straight leg, [place]inchfield Neurologic: Normal sensory function. No focal deficits noted. DTR's equal and symmetric in LE's. No clonus.Gait antalgic.  Psychiatric: Normal mood. Age appropriate judgment and insight. Alert & oriented x 3.    Assessment:  Pain in both lower extremities - Plan: Sedimentation rate, predniSONE (DELTASONE) 20 MG tablet, methylPREDNISolone acetate (DEPO-MEDROL)  injection 40 mg  Arthralgia, unspecified joint - Plan: Sedimentation rate, VITAMIN D 25 Hydroxy (Vit-D Deficiency, Fractures), Uric acid, methylPREDNISolone acetate (DEPO-MEDROL) injection 40 mg  Plan: New problem, uncertain prognosis.  Suspect gout/inflammatory arthropathy.  Depo-Medrol 40 mg today.  She will monitor her sugars over the next day and inform me if we are not doing well.  I will send in a 4-day prednisone burst to start Friday as she is going to [place] at that time.  Check above labs.  Stretches, ice, Tylenol.  F/u as needed. The patient voiced understanding and agreement to the plan.   [place], DO 01/02/21  4:44 PM

## 2021-01-03 ENCOUNTER — Other Ambulatory Visit: Payer: Self-pay | Admitting: Family Medicine

## 2021-01-03 ENCOUNTER — Encounter: Payer: Self-pay | Admitting: Family Medicine

## 2021-01-03 LAB — SEDIMENTATION RATE: Sed Rate: 9 mm/h (ref 0–30)

## 2021-01-03 LAB — URIC ACID: Uric Acid, Serum: 2.9 mg/dL (ref 2.5–7.0)

## 2021-01-03 LAB — VITAMIN D 25 HYDROXY (VIT D DEFICIENCY, FRACTURES): Vit D, 25-Hydroxy: 18 ng/mL — ABNORMAL LOW (ref 30–100)

## 2021-01-03 MED ORDER — VITAMIN D (ERGOCALCIFEROL) 1.25 MG (50000 UNIT) PO CAPS: 50000.0000 [IU] | ORAL_CAPSULE | ORAL | 0 refills | Status: DC

## 2021-01-07 ENCOUNTER — Ambulatory Visit: Payer: Medicare HMO | Admitting: Medical

## 2021-01-07 ENCOUNTER — Other Ambulatory Visit: Payer: Self-pay | Admitting: Family Medicine

## 2021-01-07 DIAGNOSIS — E559 Vitamin D deficiency, unspecified: Secondary | ICD-10-CM

## 2021-01-07 NOTE — Progress Notes (Signed)
vi 

## 2021-01-09 ENCOUNTER — Encounter: Payer: Self-pay | Admitting: Family Medicine

## 2021-01-13 ENCOUNTER — Other Ambulatory Visit: Payer: Self-pay | Admitting: Family

## 2021-01-19 ENCOUNTER — Emergency Department (HOSPITAL_BASED_OUTPATIENT_CLINIC_OR_DEPARTMENT_OTHER): Payer: Medicare HMO

## 2021-01-19 ENCOUNTER — Encounter (HOSPITAL_BASED_OUTPATIENT_CLINIC_OR_DEPARTMENT_OTHER): Payer: Self-pay

## 2021-01-19 ENCOUNTER — Emergency Department (HOSPITAL_BASED_OUTPATIENT_CLINIC_OR_DEPARTMENT_OTHER)
Admission: EM | Admit: 2021-01-19 | Discharge: 2021-01-19 | Disposition: A | Discharge status: Home / Self Care | Payer: Medicare HMO | Attending: Emergency Medicine | Admitting: Emergency Medicine

## 2021-01-19 ENCOUNTER — Other Ambulatory Visit: Payer: Self-pay

## 2021-01-19 DIAGNOSIS — E119 Type 2 diabetes mellitus without complications: Secondary | ICD-10-CM | POA: Insufficient documentation

## 2021-01-19 DIAGNOSIS — I10 Essential (primary) hypertension: Secondary | ICD-10-CM | POA: Insufficient documentation

## 2021-01-19 DIAGNOSIS — R600 Localized edema: Secondary | ICD-10-CM | POA: Insufficient documentation

## 2021-01-19 DIAGNOSIS — R2243 Localized swelling, mass and lump, lower limb, bilateral: Secondary | ICD-10-CM | POA: Diagnosis not present

## 2021-01-19 DIAGNOSIS — Z7984 Long term (current) use of oral hypoglycemic drugs: Secondary | ICD-10-CM | POA: Diagnosis not present

## 2021-01-19 DIAGNOSIS — Z79899 Other long term (current) drug therapy: Secondary | ICD-10-CM | POA: Diagnosis not present

## 2021-01-19 DIAGNOSIS — I82412 Acute embolism and thrombosis of left femoral vein: Secondary | ICD-10-CM | POA: Diagnosis not present

## 2021-01-19 DIAGNOSIS — R609 Edema, unspecified: Secondary | ICD-10-CM

## 2021-01-19 DIAGNOSIS — G609 Hereditary and idiopathic neuropathy, unspecified: Secondary | ICD-10-CM | POA: Diagnosis not present

## 2021-01-19 DIAGNOSIS — M25571 Pain in right ankle and joints of right foot: Secondary | ICD-10-CM | POA: Diagnosis not present

## 2021-01-19 LAB — COMPREHENSIVE METABOLIC PANEL
ALT: 16 U/L (ref 0–44)
AST: 23 U/L (ref 15–41)
Albumin: 4.2 g/dL (ref 3.5–5.0)
Alkaline Phosphatase: 70 U/L (ref 38–126)
Anion gap: 9 (ref 5–15)
BUN: 17 mg/dL (ref 8–23)
CO2: 24 mmol/L (ref 22–32)
Calcium: 9.1 mg/dL (ref 8.9–10.3)
Chloride: 108 mmol/L (ref 98–111)
Creatinine, Ser: 0.99 mg/dL (ref 0.44–1.00)
GFR, Estimated: >60 mL/min (ref >60)
Glucose, Bld: 138 mg/dL — ABNORMAL HIGH (ref 70–99)
Potassium: 4.4 mmol/L (ref 3.5–5.1)
Sodium: 141 mmol/L (ref 135–145)
Total Bilirubin: 0.5 mg/dL (ref 0.3–1.2)
Total Protein: 7.1 g/dL (ref 6.5–8.1)

## 2021-01-19 LAB — CBC WITH DIFFERENTIAL/PLATELET
Abs Immature Granulocytes: 0.01 10*3/uL (ref 0.00–0.07)
Basophils Absolute: 0 10*3/uL (ref 0.0–0.1)
Basophils Relative: 1 %
Eosinophils Absolute: 0.2 10*3/uL (ref 0.0–0.5)
Eosinophils Relative: 3 %
HCT: 38.7 % (ref 36.0–46.0)
Hemoglobin: 13.1 g/dL (ref 12.0–15.0)
Immature Granulocytes: 0 %
Lymphocytes Relative: 25 %
Lymphs Abs: 1.7 10*3/uL (ref 0.7–4.0)
MCH: 30.2 pg (ref 26.0–34.0)
MCHC: 33.9 g/dL (ref 30.0–36.0)
MCV: 89.2 fL (ref 80.0–100.0)
Monocytes Absolute: 0.5 10*3/uL (ref 0.1–1.0)
Monocytes Relative: 8 %
Neutro Abs: 4.2 10*3/uL (ref 1.7–7.7)
Neutrophils Relative %: 63 %
Platelets: 199 10*3/uL (ref 150–400)
RBC: 4.34 MIL/uL (ref 3.87–5.11)
RDW: 12.2 % (ref 11.5–15.5)
WBC: 6.7 10*3/uL (ref 4.0–10.5)
nRBC: 0 % (ref 0.0–0.2)

## 2021-01-19 LAB — BRAIN NATRIURETIC PEPTIDE: B Natriuretic Peptide: 40.4 pg/mL (ref 0.0–100.0)

## 2021-01-19 MED ORDER — NAPROXEN 375 MG PO TABS: 375.0000 mg | ORAL_TABLET | Freq: Two times a day (BID) | ORAL | 0 refills | Status: AC

## 2021-01-19 MED ORDER — DOXYCYCLINE HYCLATE 100 MG PO CAPS: 100.0000 mg | ORAL_CAPSULE | Freq: Two times a day (BID) | ORAL | 0 refills | Status: DC

## 2021-01-19 NOTE — Discharge Instructions (Addendum)
Your ultrasound today did not show any evidence of a blood clot in your leg but did show the following findings:  "1. No evidence of acute deep venous thrombosis within either lower extremity. 2. Linear echogenic focus within the left femoral vein, favor prominent valve over synechia as result of chronic DVT."  At this time I have low suspicion that this finding is clinically relevant however your primary care provider may want to repeat the ultrasound in 7 to 10 days if you have persistent symptoms or if you have any worsening symptoms.    Your laboratory work was reassuring.    We did give you prescription for antibiotics to cover for a potential early cellulitis and you were also given a prescription for some anti-inflammatory medications as your pain could also be related to inflammation from your arthritis.    Please wear compression stockings, keep the leg elevated and use ice packs for 30 minutes at a time to help with the swelling and pain.    Please make an appointment  with your regular doctor in the next 5 to 7 days for assessment and return to the emergency department for any new or worsening symptoms.

## 2021-01-19 NOTE — ED Triage Notes (Signed)
Pt reports swelling bilat lower extremities x3 weeks, recent travel to [place], returned home yesterday, Martin Majestic to urgent care this morning, sent to ER for evaluation

## 2021-01-19 NOTE — ED Provider Notes (Signed)
[facility] EMERGENCY DEPARTMENT Provider Note   CSN: [id number] Arrival date & time: 01/19/21  1252     History Chief Complaint  Patient presents with   Leg Swelling    Jacqueline [name] is a 70 y.o. female.  HPI  70 year old female history of diabetes, hyperlipidemia, hypertension, nephrolithiasis, obesity, TIA, who presents to the emergency department today for evaluation of bilateral lower extremity swelling right greater than left that started about 3 weeks ago but worsened over the last 2 weeks.  Patient reports that she recently flew to [place] and back and was on a plane for about 9 hours.  She does note that her symptoms started prior to leaving for [place] and she was seen by her PCP about this he was concerned that she may have arthritis or gout so gave her a shot of prednisone.  Her symptoms have not improved since that time.  She has had no history of gout.  She denies any chest pain or shortness of breath.  Past Medical History:  Diagnosis Date   Diabetes mellitus    HLD (hyperlipidemia)    Hypertension    Nephrolithiasis    Obese    TIA (transient ischemic attack) 2020    Patient Active Problem List   Diagnosis Date Noted   Skin lesion 12/07/2020   Allergic drug reaction 11/07/2020   Injury of left great toe 11/07/2020   Anxiety and depression 10/09/2020   Soft tissue mass 10/02/2020   Radiculopathy of thoracic region 10/02/2020   Type 2 diabetes mellitus with hyperglycemia, with long-term current use of insulin ([place]) 09/25/2020   Type 2 diabetes mellitus with diabetic polyneuropathy, with long-term current use of insulin ([place]) 09/25/2020   History of CVA (cerebrovascular accident) 09/12/2020   Hypertension 09/12/2020   Hematuria 12/29/2018   Migraine without status migrainosus, not intractable 12/29/2018   Trigeminal neuralgia 12/29/2018   TIA (transient ischemic attack) 07/13/2018   Left-sided weakness 07/13/2018   Morbid obesity ([place])  11/25/2013   HLD (hyperlipidemia) 11/25/2013   DM type 2 (diabetes mellitus, type 2) ([place]) 11/25/2013    Past Surgical History:  Procedure Laterality Date   ROTATOR CUFF REPAIR  2009   TUBAL LIGATION  1979     OB History   No obstetric history on file.     Family History  Problem Relation Age of Onset   Diabetes Father    Heart attack Father 68   Heart disease Mother        hx cabg at 53   Diabetes type II Brother    Parkinson's disease Sister    Brain cancer Sister    Pancreatic cancer Brother    Diabetes Mellitus II Sister    Diabetes Mellitus II Sister    CAD Sister    Diabetes Mellitus II Sister    Diabetes Mellitus II Sister    Osteoarthritis Daughter    AAA (abdominal aortic aneurysm) Daughter     Social History   Tobacco Use   Smoking status: Never   Smokeless tobacco: Never  Vaping Use   Vaping Use: Never used  Substance Use Topics   Alcohol use: No   Drug use: No    Home Medications Prior to Admission medications   Medication Sig Start Date End Date Taking? Authorizing Provider  doxycycline (VIBRAMYCIN) 100 MG capsule Take 1 capsule (100 mg total) by mouth 2 (two) times daily for 7 days. 01/19/21 01/26/21 Yes Bri Wakeman S, PA-C  naproxen (NAPROSYN) 375 MG tablet  Take 1 tablet (375 mg total) by mouth 2 (two) times daily with a meal for 7 days. 01/19/21 01/26/21 Yes Adelene Polivka S, PA-C  ACCU-CHEK GUIDE test strip USE AS INSTRUCTED TO TEST BLOOD SUGARS 3 TIMES DAILY E11.65 11/27/20   Shamleffer, Melanie Crazier, MD  BD PEN NEEDLE NANO 2ND GEN 32G X 4 MM MISC USE DAILY AS DIRECTED 01/16/20   Shamleffer, Melanie Crazier, MD  clopidogrel (PLAVIX) 75 MG tablet Take 1 tablet (75 mg total) by mouth daily. 10/16/20   Frann Rider, NP  insulin detemir (LEVEMIR) 100 UNIT/ML FlexPen Inject 28 Units into the skin daily. 12/24/20   Shamleffer, Melanie Crazier, MD  Lancets (ACCU-CHEK MULTICLIX) lancets Use as instructed to test blood sugars 3 times daily  E11.65 10/04/18   Shamleffer, Melanie Crazier, MD  losartan (COZAAR) 25 MG tablet TAKE 1 TABLET (25 MG TOTAL) BY MOUTH DAILY. 01/14/21   [name], NP  metFORMIN (GLUCOPHAGE) 1000 MG tablet Take 1 tablet (1,000 mg total) by mouth 2 (two) times daily with a meal. 12/24/20   Shamleffer, Melanie Crazier, MD  rosuvastatin (CRESTOR) 40 MG tablet Take 1 tablet (40 mg total) by mouth at bedtime. 09/13/20   Pokhrel, Corrie Mckusick, MD  topiramate (TOPAMAX) 50 MG tablet Take 1 tablet (50 mg total) by mouth at bedtime. 04/16/20   Frann Rider, NP  Vitamin D, Ergocalciferol, (DRISDOL) 1.25 MG (50000 UNIT) CAPS capsule Take 1 capsule (50,000 Units total) by mouth every 7 (seven) days. 01/03/21   Shelda Pal, DO    Allergies    Augmentin [amoxicillin-pot clavulanate], Definity [perflutren lipid microsphere], Novocain [procaine], Promethazine, and Prozac [fluoxetine hcl]  Review of Systems   Review of Systems  Constitutional:  Negative for fever.  Eyes:  Negative for visual disturbance.  Respiratory:  Negative for shortness of breath.   Cardiovascular:  Positive for leg swelling. Negative for chest pain.  Gastrointestinal:  Negative for abdominal pain and vomiting.  Genitourinary:  Negative for pelvic pain.  Musculoskeletal:        Right ankle pain   Physical Exam Updated Vital Signs BP 137/71   Pulse 61   Temp 98.4 F (36.9 C) (Oral)   Resp 18   Ht 5\' 5"  (1.651 m)   Wt 80.3 kg   SpO2 99%   BMI 29.45 kg/m   Physical Exam Vitals and nursing note reviewed.  Constitutional:      General: She is not in acute distress.    Appearance: She is well-developed.  HENT:     Head: Normocephalic and atraumatic.  Eyes:     Conjunctiva/sclera: Conjunctivae normal.  Cardiovascular:     Rate and Rhythm: Normal rate and regular rhythm.     Heart sounds: No murmur heard. Pulmonary:     Effort: Pulmonary effort is normal. No respiratory distress.     Breath sounds: Normal breath sounds. No  wheezing.  Abdominal:     Palpations: Abdomen is soft.     Tenderness: There is no abdominal tenderness.  Musculoskeletal:        General: No swelling.     Cervical back: Neck supple.     Right lower leg: Edema present.     Left lower leg: Edema present.     Comments: TTP to the rle along the ankle. There is mild warmth and erythema however pt has normal/painless ROM of the right ankle joint. DP pulses are 2+ and symmetric  Skin:    General: Skin is warm and dry.  Capillary Refill: Capillary refill takes less than 2 seconds.  Neurological:     Mental Status: She is alert.  Psychiatric:        Mood and Affect: Mood normal.    ED Results / Procedures / Treatments   Labs (all labs ordered are listed, but only abnormal results are displayed) Labs Reviewed  COMPREHENSIVE METABOLIC PANEL - Abnormal; Notable for the following components:      Result Value   Glucose, Bld 138 (*)    All other components within normal limits  BRAIN NATRIURETIC PEPTIDE  CBC WITH DIFFERENTIAL/PLATELET    EKG None  Radiology DG Ankle Complete Right  Result Date: 01/19/2021 CLINICAL DATA:  Right ankle pain for several weeks, edema EXAM: RIGHT ANKLE - COMPLETE 3+ VIEW COMPARISON:  None. FINDINGS: Frontal, oblique, and lateral views of the right ankle are obtained. No acute displaced fracture, subluxation, or dislocation. Sequela of chronic trauma at the medial and lateral malleoli without evidence of acute injury. Mild hindfoot osteoarthritis. Prominent superior and inferior calcaneal spurs are noted. Soft tissues are grossly unremarkable. IMPRESSION: 1. Mild degenerative changes.  No acute bony abnormality. Electronically Signed   By: Randa Ngo M.D.   On: 01/19/2021 15:37   US Venous Img Lower Bilateral (DVT)  Result Date: 01/19/2021 CLINICAL DATA:  Bilateral lower extremity edema, right greater than left, recent travel history EXAM: BILATERAL LOWER EXTREMITY VENOUS DOPPLER ULTRASOUND TECHNIQUE:  Gray-scale sonography with compression, as well as color and duplex ultrasound, were performed to evaluate the deep venous system(s) from the level of the common femoral vein through the popliteal and proximal calf veins. COMPARISON:  None. FINDINGS: VENOUS Normal compressibility of the common femoral, superficial femoral, and popliteal veins, as well as the visualized calf veins. Visualized portions of profunda femoral vein and great saphenous vein unremarkable. No filling defects to suggest DVT on grayscale or color Doppler imaging. Doppler waveforms show normal direction of venous flow, normal respiratory plasticity and response to augmentation. OTHER A linear echogenic focus identified by the sonographer within the proximal aspect of the left femoral vein may reflect a prominent valve or synechia as result of chronic thrombus. Limitations: none IMPRESSION: 1. No evidence of acute deep venous thrombosis within either lower extremity. 2. Linear echogenic focus within the left femoral vein, favor prominent valve over synechia as result of chronic DVT. Electronically Signed   By: Randa Ngo M.D.   On: 01/19/2021 16:23    Procedures Procedures   Medications Ordered in ED Medications - No data to display  ED Course  I have reviewed the triage vital signs and the nursing notes.  Pertinent labs & imaging results that were available during my care of the patient were reviewed by me and considered in my medical decision making (see chart for details).    MDM Rules/Calculators/A&P                          70 year old female presents the emergency department today for evaluation of bilateral lower extremity edema right greater than left that started a few weeks ago.  Recent extended period of travel, seen in urgent care prior to arrival and sent here for DVT evaluation.  Bilateral lower extremity ultrasounds negative for DVT.  Does show some changes to when the valves in the left femoral vein which I do  not think is clinically significant at this time.  Have recommended the patient follow-up with PCP and she may need to have  repeat ultrasound.  An x-ray of the right ankle was also completed which shows osteoarthritis of the right ankle.  Symptoms today are of unclear cause.  She may have some inflammation and pain from arthritis and therefore we will treat her with anti-inflammatories we will also cover her for possible early cellulitis with doxycycline.  Have advised that she follow-up closely with her PCP and return to the ED for any new or worsening symptoms the meantime.  She voiced understanding the plan and reasons to return.  All questions answered.  Patient stable for discharge.   Final Clinical Impression(s) / ED Diagnoses Final diagnoses:  Peripheral edema    Rx / DC Orders ED Discharge Orders          Ordered    doxycycline (VIBRAMYCIN) 100 MG capsule  2 times daily        01/19/21 1713    naproxen (NAPROSYN) 375 MG tablet  2 times daily with meals        01/19/21 [address] [place], PA-C 01/19/21 1721    Margette Fast, MD 01/19/21 1900

## 2021-01-21 ENCOUNTER — Institutional Professional Consult (permissible substitution): Payer: Medicare HMO | Admitting: Neurology

## 2021-01-21 ENCOUNTER — Other Ambulatory Visit: Payer: Self-pay

## 2021-01-21 ENCOUNTER — Encounter: Payer: Self-pay | Admitting: Neurology

## 2021-01-21 MED ORDER — VITAMIN D (ERGOCALCIFEROL) 1.25 MG (50000 UNIT) PO CAPS: 50000.0000 [IU] | ORAL_CAPSULE | ORAL | 0 refills | Status: DC

## 2021-01-23 ENCOUNTER — Telehealth: Payer: Self-pay | Admitting: Family

## 2021-01-23 NOTE — Telephone Encounter (Signed)
Unfortunately, I do not have any openings today and am off tomorrow. OK to wait until tomorrow but if she has any worsening of her symptoms she should proceed to the ED.

## 2021-01-23 NOTE — Telephone Encounter (Signed)
Patient reports her symptoms are mild and not painful, she will follow up on Friday. Patient advised of going to ed if symptoms get worse. She verbalized understanding.

## 2021-01-23 NOTE — Telephone Encounter (Signed)
Confirmed with provider she meant it was ok to wait until Friday (not Thursday as long as symptoms do not get worse)

## 2021-01-23 NOTE — Telephone Encounter (Signed)
Pt was seen in ed 12/10 for leg swelling and was told to be seen with her pcp as soon as possible. Since she does have blockage in her leg. She made an appt for first available 12/16. She called to see if there was anything sooner, she was informed she could get seen with another provider sooner but she did not want to, told her she would be called if we could get her in any sooner, and let cma know. Please advise.

## 2021-01-24 ENCOUNTER — Other Ambulatory Visit: Payer: Self-pay

## 2021-01-24 MED ORDER — ACCU-CHEK GUIDE VI STRP: ORAL_STRIP | 5 refills | Status: DC

## 2021-01-25 ENCOUNTER — Ambulatory Visit (INDEPENDENT_AMBULATORY_CARE_PROVIDER_SITE_OTHER): Payer: Medicare HMO | Admitting: Family

## 2021-01-25 VITALS — BP 122/53 | HR 72 | Temp 98.2°F | Resp 16 | Ht 65.0 in | Wt 178.4 lb

## 2021-01-25 DIAGNOSIS — M79606 Pain in leg, unspecified: Secondary | ICD-10-CM

## 2021-01-25 NOTE — Progress Notes (Signed)
Subjective:   By signing my name below, I, Jacqueline [name], attest that this documentation has been prepared under the direction and in the presence of Jacqueline Alar, NP, 01/25/2021     Patient ID: Jacqueline [name], female    DOB: Jul 06, 1950, 70 y.o.   MRN: [id number]  Chief Complaint  Patient presents with   Follow-up    Here for Follow Up visit     HPI Patient is in today for an office visit.  Recent ED visit: She recently went to the ED for right calf pain. She notes that her left calf also has pain and swelling but the right one is worse. She recently went to [place] on a trip and did a lot of walking while she was there. Before she left she went to see a provider in [place] for the pain and he administered a cortisone shot to help with the pain but no x-rays or further investigations were performed. When she went to the ED they performed x-rays and found arthritis present in her right ankle. A DVT analysis was performed and no DVTs were found but a vein in her upper left leg showed some scarring from a previous clot versus a prominent valve. She notes that there is a bruise on her left upper thigh and left calf. She is compliant in taking 75 mg Plavix. She reports that her pain is now resolved.  Health Maintenance Due  Topic Date Due   Hepatitis C Screening  Never done   COLONOSCOPY (Pts 45-47yrs Insurance coverage will need to be confirmed)  Never done   Zoster Vaccines- Shingrix (1 of 2) Never done   DEXA SCAN  Never done   Pneumonia Vaccine 86+ Years old (2 - PPSV23 if available, else PCV20) 07/18/2018   COVID-19 Vaccine (2 - Moderna series) 06/16/2019   FOOT EXAM  05/02/2020   OPHTHALMOLOGY EXAM  09/04/2020    Past Medical History:  Diagnosis Date   Diabetes mellitus    HLD (hyperlipidemia)    Hypertension    Nephrolithiasis    Obese    TIA (transient ischemic attack) 2020    Past Surgical History:  Procedure Laterality Date   ROTATOR CUFF  REPAIR  2009   TUBAL LIGATION  1979    Family History  Problem Relation Age of Onset   Diabetes Father    Heart attack Father 54   Heart disease Mother        hx cabg at 6   Diabetes type II Brother    Parkinson's disease Sister    Brain cancer Sister    Pancreatic cancer Brother    Diabetes Mellitus II Sister    Diabetes Mellitus II Sister    CAD Sister    Diabetes Mellitus II Sister    Diabetes Mellitus II Sister    Osteoarthritis Daughter    AAA (abdominal aortic aneurysm) Daughter     Social History   Socioeconomic History   Marital status: Married    Spouse name: Not on file   Number of children: 4   Years of education: Not on file   Highest education level: Not on file  Occupational History   Occupation: Retired-Missionary  Tobacco Use   Smoking status: Never   Smokeless tobacco: Never  Vaping Use   Vaping Use: Never used  Substance and Sexual Activity   Alcohol use: No   Drug use: No   Sexual activity: Yes    Partners: Male    Birth control/protection:  Post-menopausal  Other Topics Concern   Not on file  Social History Narrative   Retired Media planner   Husband is a Theme park manager- does some missions in [place]   2 great grandchildren (live in [place])   Married 48 years   4 children- 1 [place], 1  [place], 1 in [place], 1 at home   8 granchildren   Enjoys crafts   No pets   Social Determinants of Radio broadcast assistant Strain: Not on Art therapist Insecurity: Not on file  Transportation Needs: Not on file  Physical Activity: Not on file  Stress: Not on file  Social Connections: Not on file  Intimate Partner Violence: Not on file    Outpatient Medications Prior to Visit  Medication Sig Dispense Refill   BD PEN NEEDLE NANO 2ND GEN 32G X 4 MM MISC USE DAILY AS DIRECTED 100 each 2   clopidogrel (PLAVIX) 75 MG tablet Take 1 tablet (75 mg total) by mouth daily. 90 tablet 3   glucose blood (ACCU-CHEK  GUIDE) test strip Use as instructed 100 strip 5   insulin detemir (LEVEMIR) 100 UNIT/ML FlexPen Inject 28 Units into the skin daily. 30 mL 3   Lancets (ACCU-CHEK MULTICLIX) lancets Use as instructed to test blood sugars 3 times daily E11.65 100 each 12   losartan (COZAAR) 25 MG tablet TAKE 1 TABLET (25 MG TOTAL) BY MOUTH DAILY. 90 tablet 1   metFORMIN (GLUCOPHAGE) 1000 MG tablet Take 1 tablet (1,000 mg total) by mouth 2 (two) times daily with a meal. 180 tablet 3   naproxen (NAPROSYN) 375 MG tablet Take 1 tablet (375 mg total) by mouth 2 (two) times daily with a meal for 7 days. 14 tablet 0   rosuvastatin (CRESTOR) 40 MG tablet Take 1 tablet (40 mg total) by mouth at bedtime. 90 tablet 2   topiramate (TOPAMAX) 50 MG tablet Take 1 tablet (50 mg total) by mouth at bedtime. 90 tablet 3   Vitamin D, Ergocalciferol, (DRISDOL) 1.25 MG (50000 UNIT) CAPS capsule Take 1 capsule (50,000 Units total) by mouth every 7 (seven) days. 12 capsule 0   doxycycline (VIBRAMYCIN) 100 MG capsule Take 1 capsule (100 mg total) by mouth 2 (two) times daily for 7 days. 14 capsule 0   No facility-administered medications prior to visit.    Allergies  Allergen Reactions   Augmentin [Amoxicillin-Pot Clavulanate] Other (See Comments)    Altered mental status Did it involve swelling of the face/tongue/throat, SOB, or low BP? Unknown Did it involve sudden or severe rash/hives, skin peeling, or any reaction on the inside of your mouth or nose? Unknown Did you need to seek medical attention at a hospital or doctor's office? Unknown When did it last happen? unk       If all above answers are NO, may proceed with cephalosporin use.    Definity [Perflutren Lipid Microsphere] Other (See Comments)    Headache and facial flushing after Definity IVP given. No SOB or Chest pain,and Vital signs stable. Irven Baltimore, RN.   Novocain [Procaine] Other (See Comments)    Altered mental status   Promethazine Swelling     Tongue swelling   Prozac [Fluoxetine Hcl] Other (See Comments)    Lip swelling x 1 day after 1st dose    Review of Systems  Constitutional:        (+) mild swelling in both calves and ankles (+) hands consistently cold      Objective:    Physical  Exam Constitutional:      General: She is not in acute distress.    Appearance: Normal appearance. She is not ill-appearing.  HENT:     Head: Normocephalic and atraumatic.     Right Ear: External ear normal.     Left Ear: External ear normal.  Eyes:     Extraocular Movements: Extraocular movements intact.     Pupils: Pupils are equal, round, and reactive to light.  Cardiovascular:     Rate and Rhythm: Normal rate and regular rhythm.     Heart sounds: Normal heart sounds. No murmur heard.   No gallop.  Pulmonary:     Effort: Pulmonary effort is normal. No respiratory distress.     Breath sounds: Normal breath sounds. No wheezing or rales.  Musculoskeletal:     Right lower leg: 1+ Edema present.     Left lower leg: No edema.  Skin:    General: Skin is warm and dry.  Neurological:     Mental Status: She is alert and oriented to person, place, and time.  Psychiatric:        Behavior: Behavior normal.        Judgment: Judgment normal.    BP (!) 122/53 (BP Location: Right Arm, Patient Position: Sitting, Cuff Size: Small)    Pulse 72    Temp 98.2 F (36.8 C) (Oral)    Resp 16    Ht 5\' 5"  (1.651 m)    Wt 178 lb 6.4 oz (80.9 kg)    SpO2 98%    BMI 29.69 kg/m  Wt Readings from Last 3 Encounters:  01/25/21 178 lb 6.4 oz (80.9 kg)  01/19/21 177 lb (80.3 kg)  01/02/21 183 lb (83 kg)       Assessment & Plan:   Problem List Items Addressed This Visit       Unprioritized   Pain of lower extremity - Primary    Resolved.  Advised pt to let me know if her pain returns.  Discussed that bruising is likely related to plavix. Monitor.       No orders of the defined types were placed in this encounter.   I, Jacqueline Alar, NP,  personally preformed the services described in this documentation.  All medical record entries made by the scribe were at my direction and in my presence.  I have reviewed the chart and discharge instructions (if applicable) and agree that the record reflects my personal performance and is accurate and complete. 01/25/2021  I,Jacqueline [name],acting as a Education administrator for Nance Pear, NP.,have documented all relevant documentation on the behalf of Nance Pear, NP,as directed by  Nance Pear, NP while in the presence of Nance Pear, NP.  Nance Pear, NP

## 2021-01-25 NOTE — Patient Instructions (Signed)
Please continue 75 mg Plavix.

## 2021-01-25 NOTE — Assessment & Plan Note (Signed)
Resolved.  Advised pt to let me know if her pain returns.  Discussed that bruising is likely related to plavix. Monitor.

## 2021-02-13 ENCOUNTER — Other Ambulatory Visit: Payer: Self-pay | Admitting: Internal Medicine

## 2021-02-13 ENCOUNTER — Other Ambulatory Visit: Payer: Self-pay | Admitting: Adult Health

## 2021-02-13 DIAGNOSIS — E1142 Type 2 diabetes mellitus with diabetic polyneuropathy: Secondary | ICD-10-CM

## 2021-02-13 DIAGNOSIS — Z794 Long term (current) use of insulin: Secondary | ICD-10-CM

## 2021-03-01 ENCOUNTER — Telehealth: Payer: Self-pay

## 2021-03-01 ENCOUNTER — Telehealth (INDEPENDENT_AMBULATORY_CARE_PROVIDER_SITE_OTHER): Payer: Medicare HMO | Admitting: Family

## 2021-03-01 VITALS — BP 133/77 | HR 74

## 2021-03-01 DIAGNOSIS — U071 COVID-19: Secondary | ICD-10-CM

## 2021-03-01 MED ORDER — IPRATROPIUM BROMIDE 0.03 % NA SOLN: 2.0000 | Freq: Two times a day (BID) | NASAL | 0 refills | Status: DC

## 2021-03-01 MED ORDER — MOLNUPIRAVIR EUA 200MG CAPSULE: 4.0000 | ORAL_CAPSULE | Freq: Two times a day (BID) | ORAL | 0 refills | Status: AC

## 2021-03-01 MED ORDER — DEXTROMETHORPHAN-GUAIFENESIN 10-100 MG/5ML PO SYRP: 5.0000 mL | ORAL_SOLUTION | Freq: Two times a day (BID) | ORAL | 0 refills | Status: DC

## 2021-03-01 NOTE — Telephone Encounter (Signed)
Pt is covid positive. Seeing ES at 4 pm today. Tested today, sxs started Tuesday and include: Runny nose, myalgia, HA, and congestion.   She was concerned that Percell Miller did not know her or her medical hx. I let her know that we needed her to be seen within the 5 day window and that you were int he office incase he should need you.

## 2021-03-01 NOTE — Progress Notes (Signed)
Jacqueline [name] is a 71 y.o. female with the following history as recorded in [employer]:  Patient Active Problem List   Diagnosis Date Noted   Pain of lower extremity 01/25/2021   Skin lesion 12/07/2020   Allergic drug reaction 11/07/2020   Injury of left great toe 11/07/2020   Anxiety and depression 10/09/2020   Soft tissue mass 10/02/2020   Radiculopathy of thoracic region 10/02/2020   Type 2 diabetes mellitus with hyperglycemia, with long-term current use of insulin ([place]) 09/25/2020   Type 2 diabetes mellitus with diabetic polyneuropathy, with long-term current use of insulin ([place]) 09/25/2020   History of CVA (cerebrovascular accident) 09/12/2020   Hypertension 09/12/2020   Hematuria 12/29/2018   Migraine without status migrainosus, not intractable 12/29/2018   Trigeminal neuralgia 12/29/2018   TIA (transient ischemic attack) 07/13/2018   Left-sided weakness 07/13/2018   HLD (hyperlipidemia) 11/25/2013   DM type 2 (diabetes mellitus, type 2) ([place]) 11/25/2013    Current Outpatient Medications  Medication Sig Dispense Refill   Dextromethorphan-guaiFENesin 10-100 MG/5ML liquid Take 5 mLs by mouth every 12 (twelve) hours. 236 mL 0   ipratropium (ATROVENT) 0.03 % nasal spray Place 2 sprays into both nostrils every 12 (twelve) hours. 30 mL 0   molnupiravir EUA (LAGEVRIO) 200 mg CAPS capsule Take 4 capsules (800 mg total) by mouth 2 (two) times daily for 5 days. 40 capsule 0   BD PEN NEEDLE NANO 2ND GEN 32G X 4 MM MISC USE DAILY AS DIRECTED 100 each 2   clopidogrel (PLAVIX) 75 MG tablet Take 1 tablet (75 mg total) by mouth daily. 90 tablet 3   glucose blood (ACCU-CHEK GUIDE) test strip Use as instructed 100 strip 5   Lancets (ACCU-CHEK MULTICLIX) lancets Use as instructed to test blood sugars 3 times daily E11.65 100 each 12   LEVEMIR FLEXTOUCH 100 UNIT/ML FlexTouch Pen INJECT 20 UNITS INTO THE SKIN DAILY. 30 mL 3   losartan (COZAAR) 25 MG tablet TAKE 1 TABLET (25 MG TOTAL) BY MOUTH  DAILY. 90 tablet 1   metFORMIN (GLUCOPHAGE) 1000 MG tablet Take 1 tablet (1,000 mg total) by mouth 2 (two) times daily with a meal. 180 tablet 3   rosuvastatin (CRESTOR) 40 MG tablet Take 1 tablet (40 mg total) by mouth at bedtime. 90 tablet 2   topiramate (TOPAMAX) 50 MG tablet Take 1 tablet (50 mg total) by mouth at bedtime. 90 tablet 3   Vitamin D, Ergocalciferol, (DRISDOL) 1.25 MG (50000 UNIT) CAPS capsule Take 1 capsule (50,000 Units total) by mouth every 7 (seven) days. 12 capsule 0   No current facility-administered medications for this visit.    Allergies: Augmentin [amoxicillin-pot clavulanate], Definity [perflutren lipid microsphere], Novocain [procaine], Promethazine, and Prozac [fluoxetine hcl]  Past Medical History:  Diagnosis Date   Diabetes mellitus    HLD (hyperlipidemia)    Hypertension    Nephrolithiasis    Obese    TIA (transient ischemic attack) 2020    Past Surgical History:  Procedure Laterality Date   ROTATOR CUFF REPAIR  2009   TUBAL LIGATION  1979    Family History  Problem Relation Age of Onset   Diabetes Father    Heart attack Father 106   Heart disease Mother        hx cabg at 94   Diabetes type II Brother    Parkinson's disease Sister    Brain cancer Sister    Pancreatic cancer Brother    Diabetes Mellitus II Sister  Diabetes Mellitus II Sister    CAD Sister    Diabetes Mellitus II Sister    Diabetes Mellitus II Sister    Osteoarthritis Daughter    AAA (abdominal aortic aneurysm) Daughter     Social History   Tobacco Use   Smoking status: Never   Smokeless tobacco: Never  Substance Use Topics   Alcohol use: No    Subjective:    I connected with Jacqueline [name] on 03/01/21 at  4:00 PM EST by a video enabled telemedicine application and verified that I am speaking with the correct person using two identifiers.   I discussed the limitations of evaluation and management by telemedicine and the availability of in person appointments. The  patient expressed understanding and agreed to proceed. Provider in office/ patient is at home; provider and patient are only 2 people on video call.   COVID positive last night; started with cough/ congestion on Tuesday; no fever or concerning chest pain or shortness of breath; is using Tylenol and Vitamin C; notes that blood sugars have been normal;     Objective:  Vitals:   03/01/21 1552  BP: 133/77  Pulse: 74    General: Well developed, well nourished, in no acute distress  Skin : Warm and dry.  Head: Normocephalic and atraumatic  Lungs: Respirations unlabored;  Neurologic: Alert and oriented; speech intact; face symmetrical;   Assessment:  1. COVID-19     Plan:  Rx for Molnupiravir- take as directed; Rx for Ipratropium nasal spray to help with nasal congestion; will also send in sugar free Robitussin DM- they will buy OTC if not covered by insurance; increase fluids, rest and follow up worse, no better.   No follow-ups on file.  No orders of the defined types were placed in this encounter.   Requested Prescriptions   Signed Prescriptions Disp Refills   molnupiravir EUA (LAGEVRIO) 200 mg CAPS capsule 40 capsule 0    Sig: Take 4 capsules (800 mg total) by mouth 2 (two) times daily for 5 days.   ipratropium (ATROVENT) 0.03 % nasal spray 30 mL 0    Sig: Place 2 sprays into both nostrils every 12 (twelve) hours.   Dextromethorphan-guaiFENesin 10-100 MG/5ML liquid 236 mL 0    Sig: Take 5 mLs by mouth every 12 (twelve) hours.

## 2021-03-06 ENCOUNTER — Ambulatory Visit: Payer: Medicare HMO | Admitting: Adult Health

## 2021-03-20 ENCOUNTER — Other Ambulatory Visit: Payer: Self-pay

## 2021-03-20 MED ORDER — BD PEN NEEDLE NANO 2ND GEN 32G X 4 MM MISC: 5 refills | Status: DC

## 2021-04-03 ENCOUNTER — Ambulatory Visit (INDEPENDENT_AMBULATORY_CARE_PROVIDER_SITE_OTHER): Payer: Medicare HMO | Admitting: Family

## 2021-04-03 VITALS — BP 116/59 | HR 69 | Temp 98.2°F | Resp 16 | Ht 65.0 in | Wt 180.0 lb

## 2021-04-03 DIAGNOSIS — E559 Vitamin D deficiency, unspecified: Secondary | ICD-10-CM

## 2021-04-03 DIAGNOSIS — R531 Weakness: Secondary | ICD-10-CM

## 2021-04-03 DIAGNOSIS — E785 Hyperlipidemia, unspecified: Secondary | ICD-10-CM

## 2021-04-03 DIAGNOSIS — E1159 Type 2 diabetes mellitus with other circulatory complications: Secondary | ICD-10-CM | POA: Diagnosis not present

## 2021-04-03 DIAGNOSIS — Z794 Long term (current) use of insulin: Secondary | ICD-10-CM

## 2021-04-03 DIAGNOSIS — I10 Essential (primary) hypertension: Secondary | ICD-10-CM

## 2021-04-03 DIAGNOSIS — L989 Disorder of the skin and subcutaneous tissue, unspecified: Secondary | ICD-10-CM

## 2021-04-03 DIAGNOSIS — E1165 Type 2 diabetes mellitus with hyperglycemia: Secondary | ICD-10-CM | POA: Diagnosis not present

## 2021-04-03 LAB — BASIC METABOLIC PANEL
BUN: 22 mg/dL (ref 6–23)
CO2: 32 mEq/L (ref 19–32)
Calcium: 9.8 mg/dL (ref 8.4–10.5)
Chloride: 104 mEq/L (ref 96–112)
Creatinine, Ser: 0.9 mg/dL (ref 0.40–1.20)
GFR: 64.16 mL/min (ref >60.00)
Glucose, Bld: 121 mg/dL — ABNORMAL HIGH (ref 70–99)
Potassium: 5.2 mEq/L — ABNORMAL HIGH (ref 3.5–5.1)
Sodium: 139 mEq/L (ref 135–145)

## 2021-04-03 LAB — VITAMIN D 25 HYDROXY (VIT D DEFICIENCY, FRACTURES): VITD: 23.05 ng/mL — ABNORMAL LOW (ref 30.00–100.00)

## 2021-04-03 NOTE — Assessment & Plan Note (Deleted)
Lab Results  Component Value Date   HGBA1C 6.9 (A) 12/24/2020   HGBA1C 7.9 (H) 09/13/2020   HGBA1C 7.2 (A) 04/02/2020   Lab Results  Component Value Date   MICROALBUR 1.4 04/02/2020   LDLCALC 31 10/16/2020   CREATININE 0.99 01/19/2021   a1c was at goal. Management per endo.

## 2021-04-03 NOTE — Assessment & Plan Note (Signed)
Lab Results  Component Value Date   CHOL 83 (L) 10/16/2020   HDL 31 (L) 10/16/2020   LDLCALC 31 10/16/2020   TRIG 112 10/16/2020   CHOLHDL 2.7 10/16/2020   LDL at goal. Continue crestor.

## 2021-04-03 NOTE — Assessment & Plan Note (Signed)
Chronic, ever since CVA. Offered referral to outpatient PT. She declines and prefers to do exercises at home. We discussed walking, stationary bike, swimming.

## 2021-04-03 NOTE — Assessment & Plan Note (Signed)
Lab Results  Component Value Date   HGBA1C 6.9 (A) 12/24/2020   HGBA1C 7.9 (H) 09/13/2020   HGBA1C 7.2 (A) 04/02/2020   Lab Results  Component Value Date   MICROALBUR 1.4 04/02/2020   LDLCALC 31 10/16/2020   CREATININE 0.99 01/19/2021   a1c was at goal. Management per endo.

## 2021-04-03 NOTE — Progress Notes (Signed)
Subjective:   By signing my name below, I, Shehryar Baig, attest that this documentation has been prepared under the direction and in the presence of [name], NP 04/03/2021     Patient ID: Jacqueline [name], female    DOB: 02-05-1951, 71 y.o.   MRN: [id number]  Chief Complaint  Patient presents with   Hypertension    Here for follow up   Diabetes    Here for follow up    Hypertension Associated symptoms include headaches (right side).  Diabetes Hypoglycemia symptoms include headaches (right side).  Patient is in today for a office visit. Her husband is present with her during this visit.  She complains of occasional left leg weakness.  She complains of bruising on her lower legs. Her left side was affected during her prior stroke. She denies having any recent falls since her stroke. Her husband reports she walks with a limp. She has not completed physical therapy since her stroke. She prefers to complete exercises on her own to build up her strength.  She also complains of occasional sudden headaches on her right side after sitting. She continues seeing her neurologist regularly at this time and is planning on seeing them for this issue.  Her symptoms have cleared up at this time.  Her blood pressure is doing well during this visit. She continues taking 25 mg losartan daily PO and reports no new issues while taking it. She reports not taking it last week due to an issue with refilling it at her pharmacy. She also continues taking 75 mg plavix daily PO.  BP Readings from Last 3 Encounters:  04/03/21 (!) 116/59  03/01/21 133/77  01/25/21 (!) 122/53   Pulse Readings from Last 3 Encounters:  04/03/21 69  03/01/21 74  01/25/21 72   She continues taking 1000 mg metformin 2x daily PO, 100 unit/ML Levamir flextouch injections and reports no new issues while taking it. She continues seeing her endocrinologist specialist regularly at this time.  Lab Results  Component Value Date    HGBA1C 6.9 (A) 12/24/2020   She continues taking 40 mg Crestor daily PO and reports no new issues while taking it.  Lab Results  Component Value Date   CHOL 83 (L) 10/16/2020   HDL 31 (L) 10/16/2020   LDLCALC 31 10/16/2020   TRIG 112 10/16/2020   CHOLHDL 2.7 10/16/2020   Her last lab work showed her vitamin D levels were low. She was taking a supplement for 7 days after her lab work but reports her levels continue feeling low after completing her course.   She contracted Covid-19 in January, 2023. She has since recovered well. She reports gaining weight since contracting it.   Health Maintenance Due  Topic Date Due   Hepatitis C Screening  Never done   COLONOSCOPY (Pts 45-44yrs Insurance coverage will need to be confirmed)  Never done   Zoster Vaccines- Shingrix (1 of 2) Never done   DEXA SCAN  Never done   Pneumonia Vaccine 77+ Years old (2 - PPSV23 if available, else PCV20) 07/18/2018   COVID-19 Vaccine (2 - Moderna series) 06/16/2019   FOOT EXAM  05/02/2020   OPHTHALMOLOGY EXAM  09/04/2020    Past Medical History:  Diagnosis Date   Diabetes mellitus    HLD (hyperlipidemia)    Hypertension    Nephrolithiasis    Obese    TIA (transient ischemic attack) 2020    Past Surgical History:  Procedure Laterality Date   ROTATOR  CUFF REPAIR  2009   TUBAL LIGATION  1979    Family History  Problem Relation Age of Onset   Diabetes Father    Heart attack Father 5   Heart disease Mother        hx cabg at 19   Diabetes type II Brother    Parkinson's disease Sister    Brain cancer Sister    Pancreatic cancer Brother    Diabetes Mellitus II Sister    Diabetes Mellitus II Sister    CAD Sister    Diabetes Mellitus II Sister    Diabetes Mellitus II Sister    Osteoarthritis Daughter    AAA (abdominal aortic aneurysm) Daughter     Social History   Socioeconomic History   Marital status: Married    Spouse name: Not on file   Number of children: 4   Years of education:  Not on file   Highest education level: Not on file  Occupational History   Occupation: Retired-Missionary  Tobacco Use   Smoking status: Never   Smokeless tobacco: Never  Vaping Use   Vaping Use: Never used  Substance and Sexual Activity   Alcohol use: No   Drug use: No   Sexual activity: Yes    Partners: Male    Birth control/protection: Post-menopausal  Other Topics Concern   Not on file  Social History Narrative   Retired Media planner   Husband is a Theme park manager- does some missions in [place]   2 great grandchildren (live in [place])   Married 97 years   4 children- 1 [place], 1  [place], 1 in [place], 1 at home   8 granchildren   Enjoys crafts   No pets   Social Determinants of Radio broadcast assistant Strain: Not on file  Food Insecurity: Not on file  Transportation Needs: Not on file  Physical Activity: Not on file  Stress: Not on file  Social Connections: Not on file  Intimate Partner Violence: Not on file    Outpatient Medications Prior to Visit  Medication Sig Dispense Refill   clopidogrel (PLAVIX) 75 MG tablet Take 1 tablet (75 mg total) by mouth daily. 90 tablet 3   Dextromethorphan-guaiFENesin 10-100 MG/5ML liquid Take 5 mLs by mouth every 12 (twelve) hours. 236 mL 0   glucose blood (ACCU-CHEK GUIDE) test strip Use as instructed 100 strip 5   Insulin Pen Needle (BD PEN NEEDLE NANO 2ND GEN) 32G X 4 MM MISC USE DAILY AS DIRECTED 100 each 5   ipratropium (ATROVENT) 0.03 % nasal spray Place 2 sprays into both nostrils every 12 (twelve) hours. 30 mL 0   Lancets (ACCU-CHEK MULTICLIX) lancets Use as instructed to test blood sugars 3 times daily E11.65 100 each 12   LEVEMIR FLEXTOUCH 100 UNIT/ML FlexTouch Pen INJECT 20 UNITS INTO THE SKIN DAILY. 30 mL 3   losartan (COZAAR) 25 MG tablet TAKE 1 TABLET (25 MG TOTAL) BY MOUTH DAILY. 90 tablet 1   metFORMIN (GLUCOPHAGE) 1000 MG tablet Take 1 tablet (1,000 mg total) by mouth 2 (two) times daily with a  meal. 180 tablet 3   rosuvastatin (CRESTOR) 40 MG tablet Take 1 tablet (40 mg total) by mouth at bedtime. 90 tablet 2   topiramate (TOPAMAX) 50 MG tablet Take 1 tablet (50 mg total) by mouth at bedtime. 90 tablet 3   Vitamin D, Ergocalciferol, (DRISDOL) 1.25 MG (50000 UNIT) CAPS capsule Take 1 capsule (50,000 Units total) by mouth every 7 (seven) days. 12  capsule 0   No facility-administered medications prior to visit.    Allergies  Allergen Reactions   Augmentin [Amoxicillin-Pot Clavulanate] Other (See Comments)    Altered mental status Did it involve swelling of the face/tongue/throat, SOB, or low BP? Unknown Did it involve sudden or severe rash/hives, skin peeling, or any reaction on the inside of your mouth or nose? Unknown Did you need to seek medical attention at a hospital or doctor's office? Unknown When did it last happen? unk       If all above answers are NO, may proceed with cephalosporin use.    Definity [Perflutren Lipid Microsphere] Other (See Comments)    Headache and facial flushing after Definity IVP given. No SOB or Chest pain,and Vital signs stable. Irven Baltimore, RN.   Novocain [Procaine] Other (See Comments)    Altered mental status   Promethazine Swelling    Tongue swelling   Prozac [Fluoxetine Hcl] Other (See Comments)    Lip swelling x 1 day after 1st dose    Review of Systems  Neurological:  Positive for headaches (right side).       (+)left leg weakness  Endo/Heme/Allergies:  Bruises/bleeds easily (bilateral lower leg).      Objective:    Physical Exam Constitutional:      General: She is not in acute distress.    Appearance: Normal appearance. She is not ill-appearing.  HENT:     Head: Normocephalic and atraumatic.     Right Ear: External ear normal.     Left Ear: External ear normal.  Eyes:     Extraocular Movements: Extraocular movements intact.     Pupils: Pupils are equal, round, and reactive to light.  Cardiovascular:     Rate and  Rhythm: Normal rate and regular rhythm.     Heart sounds: Normal heart sounds. No murmur heard.   No gallop.  Pulmonary:     Effort: Pulmonary effort is normal. No respiratory distress.     Breath sounds: Normal breath sounds. No wheezing or rales.  Musculoskeletal:     Comments: 5/5 strength in both upper and lower extremities  Skin:    General: Skin is warm and dry.     Comments: dry lesion anterior chest  Neurological:     Mental Status: She is alert and oriented to person, place, and time.  Psychiatric:        Behavior: Behavior normal.    BP (!) 116/59 (BP Location: Right Arm, Patient Position: Sitting, Cuff Size: Small)    Pulse 69    Temp 98.2 F (36.8 C) (Oral)    Resp 16    Ht 5\' 5"  (1.651 m)    Wt 180 lb (81.6 kg)    SpO2 99%    BMI 29.95 kg/m  Wt Readings from Last 3 Encounters:  04/03/21 180 lb (81.6 kg)  01/25/21 178 lb 6.4 oz (80.9 kg)  01/19/21 177 lb (80.3 kg)       Assessment & Plan:   Problem List Items Addressed This Visit       Unprioritized   Vitamin D deficiency - Primary    Completed 12 weeks of vit D 50000 iu weekly. Check follow up level.       Relevant Orders   VITAMIN D 25 Hydroxy (Vit-D Deficiency, Fractures)   Type 2 diabetes mellitus with hyperglycemia, with long-term current use of insulin (HCC)    Lab Results  Component Value Date   HGBA1C 6.9 (A) 12/24/2020   HGBA1C  7.9 (H) 09/13/2020   HGBA1C 7.2 (A) 04/02/2020   Lab Results  Component Value Date   MICROALBUR 1.4 04/02/2020   LDLCALC 31 10/16/2020   CREATININE 0.99 01/19/2021  a1c was at goal. Management per endo.       Skin lesion   Relevant Orders   Ambulatory referral to Dermatology   Left-sided weakness    Chronic, ever since CVA. Offered referral to outpatient PT. She declines and prefers to do exercises at home. We discussed walking, stationary bike, swimming.       Hypertension    BP Readings from Last 3 Encounters:  04/03/21 (!) 116/59  03/01/21 133/77   01/25/21 (!) 122/53   Wt Readings from Last 3 Encounters:  04/03/21 180 lb (81.6 kg)  01/25/21 178 lb 6.4 oz (80.9 kg)  01/19/21 177 lb (80.3 kg)  BP at goal. Continue losartan.        Relevant Orders   Basic metabolic panel   HLD (hyperlipidemia)    Lab Results  Component Value Date   CHOL 83 (L) 10/16/2020   HDL 31 (L) 10/16/2020   LDLCALC 31 10/16/2020   TRIG 112 10/16/2020   CHOLHDL 2.7 10/16/2020  LDL at goal. Continue crestor.       DM type 2 (diabetes mellitus, type 2) ([place])     No orders of the defined types were placed in this encounter.   Cordella Register, NP, personally preformed the services described in this documentation.  All medical record entries made by the scribe were at my direction and in my presence.  I have reviewed the chart and discharge instructions (if applicable) and agree that the record reflects my personal performance and is accurate and complete. 04/03/2021   I,Shehryar Baig,acting as a scribe for Nance Pear, NP.,have documented all relevant documentation on the behalf of Nance Pear, NP,as directed by  Nance Pear, NP while in the presence of Nance Pear, NP.   Nance Pear, NP

## 2021-04-03 NOTE — Assessment & Plan Note (Addendum)
BP Readings from Last 3 Encounters:  04/03/21 (!) 116/59  03/01/21 133/77  01/25/21 (!) 122/53   Wt Readings from Last 3 Encounters:  04/03/21 180 lb (81.6 kg)  01/25/21 178 lb 6.4 oz (80.9 kg)  01/19/21 177 lb (80.3 kg)   BP at goal. Continue losartan.

## 2021-04-03 NOTE — Assessment & Plan Note (Signed)
Completed 12 weeks of vit D 50000 iu weekly. Check follow up level.

## 2021-04-04 ENCOUNTER — Telehealth: Payer: Self-pay | Admitting: Family

## 2021-04-04 NOTE — Telephone Encounter (Signed)
Vitamin D level is improving but still low. Please complete the full 12 weeks of 47425 iu vit D and then switch over to otc vitamin D 3000 iu once daily.  Sugar remains mildly elevated.    Potassium is mildly elevated- we will keep an eye on this.  Please keep appointment on 4/28 with me.

## 2021-04-04 NOTE — Telephone Encounter (Signed)
Lvm  for patient to call back about results. 

## 2021-04-08 ENCOUNTER — Encounter: Payer: Self-pay | Admitting: Adult Health

## 2021-04-08 ENCOUNTER — Ambulatory Visit: Payer: Medicare HMO | Admitting: Adult Health

## 2021-04-08 VITALS — BP 140/79 | HR 72 | Ht 65.0 in | Wt 182.0 lb

## 2021-04-08 DIAGNOSIS — Z8673 Personal history of transient ischemic attack (TIA), and cerebral infarction without residual deficits: Secondary | ICD-10-CM | POA: Diagnosis not present

## 2021-04-08 DIAGNOSIS — I6523 Occlusion and stenosis of bilateral carotid arteries: Secondary | ICD-10-CM

## 2021-04-08 DIAGNOSIS — I639 Cerebral infarction, unspecified: Secondary | ICD-10-CM

## 2021-04-08 DIAGNOSIS — G459 Transient cerebral ischemic attack, unspecified: Secondary | ICD-10-CM | POA: Diagnosis not present

## 2021-04-08 DIAGNOSIS — S069XAS Unspecified intracranial injury with loss of consciousness status unknown, sequela: Secondary | ICD-10-CM

## 2021-04-08 DIAGNOSIS — G43109 Migraine with aura, not intractable, without status migrainosus: Secondary | ICD-10-CM

## 2021-04-08 DIAGNOSIS — G44309 Post-traumatic headache, unspecified, not intractable: Secondary | ICD-10-CM | POA: Diagnosis not present

## 2021-04-08 MED ORDER — TOPIRAMATE 50 MG PO TABS: 50.0000 mg | ORAL_TABLET | Freq: Two times a day (BID) | ORAL | 3 refills | Status: DC

## 2021-04-08 NOTE — Patient Instructions (Addendum)
Increase Topamax dosage to 50mg  twice daily - please let me know if any difficulty tolerating or no benefit after 1 week - we can further discuss dosage adjustments if indicated  You will be called to schedule repeat carotid ultrasound to monitor your carotid stenosis (narrowing)   Please let me know if you would like to do any additional physical therapy for your walking and left leg  Please reschedule initial sleep consult visit as untreated sleep apnea could be contributing to continued headaches as well as increased risk of additional strokes  Continue clopidogrel 75 mg daily  and Crestor for secondary stroke prevention  Continue to follow up with PCP regarding cholesterol and blood pressure management  Maintain strict control of hypertension with blood pressure goal below 130/90, diabetes with hemoglobin A1c goal below 7.0 % and cholesterol with LDL cholesterol (bad cholesterol) goal below 70 mg/dL.   Signs of a Stroke? Follow the BEFAST method:  Balance Watch for a sudden loss of balance, trouble with coordination or vertigo Eyes Is there a sudden loss of vision in one or both eyes? Or double vision?  Face: Ask the person to smile. Does one side of the face droop or is it numb?  Arms: Ask the person to raise both arms. Does one arm drift downward? Is there weakness or numbness of a leg? Speech: Ask the person to repeat a simple phrase. Does the speech sound slurred/strange? Is the person confused ? Time: If you observe any of these signs, call 911.     Followup in the future with me in 6 months or call earlier if needed       Thank you for coming to see [place] at [facility]. I hope we have been able to provide you high quality care today.  You may receive a patient satisfaction survey over the next few weeks. We would appreciate your feedback and comments so that we may continue to improve ourselves and the health of our patients.

## 2021-04-08 NOTE — Progress Notes (Signed)
[facility] [address] [place] [address] [phone]       STROKE FOLLOW UP NOTE  [name] Date of Birth:  03/20/1950 Medical Record Number:  [id number]   Reason for visit: hospital stroke f/u; hx of TIA and complicated migraine    CHIEF COMPLAINT:  Chief Complaint  Patient presents with   Follow-up    Rm 3 with husband. Pt reports around several times per month she will have a sharp shooting pain at the top of her head and will subside after several seconds.      HPI:  Update 04/08/2021 JM: 71 year old female with history of ischemic stroke 09/2020, and TIA vs complicated migraine 04/8099.  Overall stable from stroke standpoint without new stroke/TIA symptoms. She does report fluctuation of LLE weakness. Per husband, walks with a limp. Reports present since stroke in 09/2020. Does plan on restarting swimming and exercising on stationary bike.  Denies any additional migraine headaches but does report sharp shooting sensation on top of head several times per month which has been present since her prior stroke.  Currently on topiramate 50 mg nightly without side effects.  Compliant on Plavix and Crestor without side effects.  Blood pressure today 140/79. No further concerns at this time.    History provided for reference purposes only Update 10/16/2020 JM: [name] returns for hospital follow-up accompanied by her husband, [name].  Presented on 09/12/2020 with left-sided numbness, weakness and vertiginous symptoms.  Evaluated by Dr. Leonie Man for right parietal subcortical stroke likely due to small vessel disease. CTA head/neck negative LVO; mild b/l carotid atherosclerosis.  Carotid duplex right ICA 40 to 59% stenosis.  LDL 134.  A1c 7.9.  Recommended DAPT for 3 weeks then Plavix alone.  Statin switched from atorvastatin 80mg  to Crestor 40 mg daily -recommend consideration of PCSK9 inhibitor outpatient.  Blood pressure stable. Therapy eval recommended  outpatient PT for residual left foot drop and gait impairment.  Overall stable since discharge.  Denies new stroke/TIA symptoms.  Denies residual stroke deficits. Reports increased depression/anxiety since stroke with fear of having additional strokes. PCP trialed Prozac but concern of causing lip swelling therefore d/c'd. Lip swelling since resolved. Also c/o insomnia and snoring. Denies witnessed apneas or daytime fatigue. No prior sleep study completed. Completed 3 weeks DAPT - Plavix d/c'd by PCP and remained on aspirin 81mg  daily as well as Crestor 40 mg daily.  Blood pressure today 140/82. Glucose levels usually <140s routinely monitored by endocrinology.  Remains on topiramate 50 mg nightly for migraine prophylaxis. No further concerns at this time.     Pertinent imaging  09/12/20 CT Angio Head and Neck W WO IV Contrast No intracranial large vessel occlusion. Atherosclerotic disease at both carotid bifurcation and ICA bulb regions. 40% stenosis in the right ICA bulb. 20% stenosis in the left ICA bulb.  09/13/2020 carotid duplex Right carotid: 40 to 59% stenosis Left carotid: 1 to 39% stenosis Vertebrals: Bilateral antegrade flow   09/13/20 MRI Brain WO IV Contrast 1. Punctate foci of acute ischemia within the medial right frontal lobe. No hemorrhage or mass effect. ( Per personal read, stroke appears to be in the parietal lobe)  2. Multiple old small vessel infarcts of the right corona radiata.   09/13/20 Echocardiogram Complete  EF 55 to 60%, mild left ventricular hypertrophy   Update 04/16/2020 JM: [name] returns for 91-month scheduled follow-up visit.  Remains on topiramate 50 mg nightly for migraine prophylaxis tolerating without side effects  Does report COVID-19 positive in January with worsening of migraines but have been slowly improving and reports to mild headaches over the past 2 weeks.  She also reports post Covid brain fog and fatigue  She does plan on undergoing cataract  surgery L eye shortly, underwent R eye procedure in November   Denies new or reoccurring stroke/TIA symptoms Reports compliance on aspirin 81mg  daily -denies side effects Blood pressure today 143/85 Recent A1c 7.2 ( prior 6.9) -routinely follows with endocrinology Lipid panel 04/02/2020 LDL 107 (prior 49 11/2018) obtained by PCP - does report stopping atorvastatin but unsure reason or when this was stopped -she believes possibly due to running out of refill  No further concerns at this time  Update 10/18/2019 JM: [name] returns for follow-up regarding TIA versus complicated migraine Currently on Topamax 50 mg nightly for migraine prevention tolerating dosage well without side effects She has not experienced any reoccurring migraine or complicated migraine symptoms or TIA symptoms She does report over the past month, she has been experiencing R upper orbital sharp "ping" type pain lasting for only a few seconds and then subsiding occurring 3-4 times per week.  She denies any visual changes associated.  Does plan on undergoing cataract procedure on the right eye scheduled on 9/27 with symptoms of blurred vision and difficulty seeing at night.  She does report history of allergies and at times can have sinus pressure/congestion.  She is not currently on allergy medication. Previously referred to vestibular rehab for BPPV with improvement after Epley maneuver done by PT and did have recent episode of vertigo which subsided after performing Epley maneuver at home.  She has not had any reoccurring symptoms. Remains on aspirin 81 mg daily and atorvastatin 80 mg daily without side effects Blood pressure today 140/74 Glucose levels stable per patient currently managed by endocrinology She is concerned regarding weight gain but recently started participating in Silver sneakers program. No further concerns at this time  Update 03/22/2019: [name] is a 71 year old female who is being seen today for stroke  follow-up.  She has been doing well from a stroke standpoint since prior visit.  She does endorse chronic history of vertigo appearing to present as BPPV with symptoms occurring with quick head movement or when lying down and changing positions.  She has not previously seen physical therapy for this concern.  No reoccurring headaches with ongoing use of Topamax 50 mg twice daily denies side effects.  Continues on aspirin and atorvastatin for secondary stroke prevention of side effects.  Blood pressure today stable.  Denies new or worsening stroke/TIA symptoms. Of note, patient does states she has been attempting to schedule appointment with PCP but due to illness with patient reporting cold-like symptoms she has been unable to be seen.  Recent COVID-19 testing negative.  Over the past couple days, she has been experiencing sharp chest pain intermittently in the midsternum radiating towards the back and into her left arm.  This only lasts for a short duration and is worsened with increased exertion.  She denies any shortness of breath, dizziness or lightheadedness.  She has previously been seen by cardiology but has not been seen recently.  Update 11/16/2018: Ms. Westerfeld is being seen today for follow-up.  Continues on aspirin without bleeding or bruising.  At prior visit, lipid panel obtained with LDL 126 (prior 119).  Recommended to increase atorvastatin dose from 40 mg to 80 mg daily.  She continues on atorvastatin 80 mg daily without myalgias.  Lipid panels have not been repeated since that time.  Blood pressure today 130/74.  Established care with endocrinology with adjustments made to medication regimen. Improvement of glucose levels per patient report.  She is also working on weight loss with prior office visit weight 208lbs and today's weight 193lb.  She has been working hard on improving diet and exercise.  Continues on Topamax 50 mg twice daily. Has not had any additional migraine with left arm numbness.   Will have occasional headaches which she believes is related to tension/stress but resolves without intervention.  No further concerns at this time.  Initial visit 09/01/2018: Since discharge, she has experienced 2 additional headaches which were preceded by left arm numbness/tingling.  Symptoms similar to prior episodes.  She has continued on Topamax 50 mg nightly with only minimal daytime fatigue but did not limit her overall functioning.   She does endorse approximately 2 weeks prior, she woke up in the morning with tingling on her nose and swelling of top lip.  She took Benadryl throughout the day and resolved within 24 hours.  Symptoms consistent with allergic reaction to unknown modality as she did not change medications at that time, denies eating a different type of food or use of lip product.  Completed 3 weeks DAPT continues on aspirin alone without bleeding or bruising Previously taking atorvastatin 40 mg daily without side effects myalgias but has since run out of prescription Blood pressure 147/77 -does not routinely monitor at home Glucose levels uncontrolled and is in the process of finding new PCP versus establishing care with endocrinology due to continued difficulty with diabetic management No further concerns at this time  Hospital admission 07/13/2018: KALLA WATSON is an 71 y.o. Caucasian female with PMH of hypertension, hyperlipidemia, obesity and diabetes who presented to [facility] on 07/14/2018 with 3 episode of left arm and face numbness tingling for the last 3 days.  Initial episode lasting only 3 minutes and then resolved but followed by headache with bifrontal throbbing was lasted approximately 3 minutes.  She attributed this to empty stomach.  Second episode occurred the next day lasting approximately 3 minutes and then accompanied by headache lasting approximately 3 minutes which occurred prior to lunch.  Third episode occurred while eating lunch with similar symptoms  plus feeling heaviness of left arm which lasted about 5 minutes followed by mild brief headache.  Denies any prior history of migraine or headaches.  She does endorse having visual auras with flashing lights in the past.  Stroke work-up unremarkable and likely right brain TIA given risk factors versus complicated migraine due to headache following each episode.  Stroke work-up completed once transferred to [facility] ED.  MRI no acute stroke but did show small deep white matter infarcts.  Vessel imaging and 2D echo unremarkable.  LDL 119 and A1c 11.2.  Recommended DAPT for 3 weeks and aspirin alone.  HTN stable.  Initiated atorvastatin 40 mg daily for HLD management.  Recommended close PCP follow-up for uncontrolled DM.  Other stroke risk factors include advanced age, obesity and one episode of prior vision loss lasting 1 to 2 seconds approximately 3 to 4 years ago.  As all symptoms resolved without residual deficit, discharged home in stable condition without therapy needs. She was discharged in the afternoon on 07/14/2018 but returned to [facility] ED that night due to recurrence of numbness in left shoulder and headache.  CT head unremarkable.  Was consulted by neurology and felt her symptoms were  likely related to complex migraine as extensive stroke work-up unremarkable.  Discharged on Topamax and recommend follow-up with neurology outpatient.     ROS:   14 system review of systems performed and negative with exception of those listed in HPI PMH:  Past Medical History:  Diagnosis Date   Diabetes mellitus    HLD (hyperlipidemia)    Hypertension    Nephrolithiasis    Obese    TIA (transient ischemic attack) 2020    PSH:  Past Surgical History:  Procedure Laterality Date   ROTATOR CUFF REPAIR  2009   TUBAL LIGATION  1979    Social History:  Social History   Socioeconomic History   Marital status: Married    Spouse name: Not on file   Number of children: 4   Years of education: Not on file    Highest education level: Not on file  Occupational History   Occupation: Retired-Missionary  Tobacco Use   Smoking status: Never   Smokeless tobacco: Never  Vaping Use   Vaping Use: Never used  Substance and Sexual Activity   Alcohol use: No   Drug use: No   Sexual activity: Yes    Partners: Male    Birth control/protection: Post-menopausal  Other Topics Concern   Not on file  Social History Narrative   Retired Media planner   Husband is a Theme park manager- does some missions in [place]   2 great grandchildren (live in [place])   Married 52 years   4 children- 1 [place], 1  [place], 1 in [place], 1 at home   8 granchildren   Enjoys crafts   No pets   Social Determinants of Radio broadcast assistant Strain: Not on file  Food Insecurity: Not on file  Transportation Needs: Not on file  Physical Activity: Not on file  Stress: Not on file  Social Connections: Not on file  Intimate Partner Violence: Not on file    Family History:  Family History  Problem Relation Age of Onset   Diabetes Father    Heart attack Father 43   Heart disease Mother        hx cabg at 77   Diabetes type II Brother    Parkinson's disease Sister    Brain cancer Sister    Pancreatic cancer Brother    Diabetes Mellitus II Sister    Diabetes Mellitus II Sister    CAD Sister    Diabetes Mellitus II Sister    Diabetes Mellitus II Sister    Osteoarthritis Daughter    AAA (abdominal aortic aneurysm) Daughter     Medications:   Current Outpatient Medications on File Prior to Visit  Medication Sig Dispense Refill   clopidogrel (PLAVIX) 75 MG tablet Take 1 tablet (75 mg total) by mouth daily. 90 tablet 3   Dextromethorphan-guaiFENesin 10-100 MG/5ML liquid Take 5 mLs by mouth every 12 (twelve) hours. 236 mL 0   glucose blood (ACCU-CHEK GUIDE) test strip Use as instructed 100 strip 5   Insulin Pen Needle (BD PEN NEEDLE NANO 2ND GEN) 32G X 4 MM MISC USE DAILY AS DIRECTED 100 each 5    ipratropium (ATROVENT) 0.03 % nasal spray Place 2 sprays into both nostrils every 12 (twelve) hours. 30 mL 0   Lancets (ACCU-CHEK MULTICLIX) lancets Use as instructed to test blood sugars 3 times daily E11.65 100 each 12   LEVEMIR FLEXTOUCH 100 UNIT/ML FlexTouch Pen INJECT 20 UNITS INTO THE SKIN DAILY. 30 mL 3   losartan (  COZAAR) 25 MG tablet TAKE 1 TABLET (25 MG TOTAL) BY MOUTH DAILY. 90 tablet 1   metFORMIN (GLUCOPHAGE) 1000 MG tablet Take 1 tablet (1,000 mg total) by mouth 2 (two) times daily with a meal. 180 tablet 3   rosuvastatin (CRESTOR) 40 MG tablet Take 1 tablet (40 mg total) by mouth at bedtime. 90 tablet 2   topiramate (TOPAMAX) 50 MG tablet Take 1 tablet (50 mg total) by mouth at bedtime. 90 tablet 3   Vitamin D, Ergocalciferol, (DRISDOL) 1.25 MG (50000 UNIT) CAPS capsule Take 1 capsule (50,000 Units total) by mouth every 7 (seven) days. 12 capsule 0   No current facility-administered medications on file prior to visit.    Allergies:   Allergies  Allergen Reactions   Augmentin [Amoxicillin-Pot Clavulanate] Other (See Comments)    Altered mental status Did it involve swelling of the face/tongue/throat, SOB, or low BP? Unknown Did it involve sudden or severe rash/hives, skin peeling, or any reaction on the inside of your mouth or nose? Unknown Did you need to seek medical attention at a hospital or doctor's office? Unknown When did it last happen? unk       If all above answers are NO, may proceed with cephalosporin use.    Definity [Perflutren Lipid Microsphere] Other (See Comments)    Headache and facial flushing after Definity IVP given. No SOB or Chest pain,and Vital signs stable. Irven Baltimore, RN.   Novocain [Procaine] Other (See Comments)    Altered mental status   Promethazine Swelling    Tongue swelling   Prozac [Fluoxetine Hcl] Other (See Comments)    Lip swelling x 1 day after 1st dose     Physical Exam  Vitals:   04/08/21 0805  BP: 140/79  Pulse: 72   Weight: 182 lb (82.6 kg)  Height: 5\' 5"  (1.651 m)   Body mass index is 30.29 kg/m. No results found.   General: well developed, well nourished,  pleasant middle-age Caucasian female, seated, in no evident distress Head: head normocephalic and atraumatic.   Neck: supple with no carotid or supraclavicular bruits Cardiovascular: regular rate and rhythm, no murmurs Musculoskeletal: no deformity Skin:  no rash/petichiae Vascular:  Normal pulses all extremities   Neurologic Exam Mental Status: Awake and fully alert.  Fluent speech and language.  Oriented to place and time. Recent and remote memory intact. Attention span, concentration and fund of knowledge appropriate. Mood and affect appropriate.  Cranial Nerves: Pupils equal, briskly reactive to light. Extraocular movements full without nystagmus. Visual fields full to confrontation. Hearing intact. Facial sensation intact. Face, tongue, palate moves normally and symmetrically.  Motor: Normal bulk and tone. Normal strength in all tested extremity muscles. Sensory.: intact to touch , pinprick , position and vibratory sensation.  Coordination: Rapid alternating movements normal in all extremities. Finger-to-nose and heel-to-shin performed accurately bilaterally. Gait and Station: Arises from chair without difficulty. Stance is normal. Gait demonstrates normal stride length although slight favoring of LLE. Balance good without use of AD. Able to tandem walk and heel toe with mild difficulty.  Romberg negative. Reflexes: 1+ and symmetric. Toes downgoing.        ASSESSMENT: SAHASRA BELUE is a 71 y.o. year old female here with right parietal subcortical stroke likely secondary to small vessel disease on 09/12/2020 without residual deficits.  Hx of right brain TIA given risk factors versus complicated migraine on 10/15/6385 after presenting with 3 episodes of intermittent numbness/tingling in left arm and face x3 days followed by  headache.   Returned to ED the evening after discharge due to return of symptoms with diagnosis of likely complicated migraine with Topamax initiated with benefit.  Vascular risk factors include HTN, HLD, uncontrolled DM, chronic BPPV, obesity, prior stroke on imaging and Covid + 02/2020.     PLAN:  Recent right parietal subcortical stroke History of prior stroke and possible TIA:  Intermittent LLE weakness and gait impairment - wishes to do strengthening exercises on her own. Advised to call if she wishes to pursue PT.  Headache post stroke: short lasting sharp zap sensation. Increase topiramate to 50mg  BID. Discussed potential side effects and to call with any concerns or call after 1 week if no benefit  Continue clopidogrel 75 mg daily and Crestor 40mg  daily for secondary stroke prevention.  Ensure close PCP and endocrinology follow-up for aggressive stroke risk factor management with  HTN with BP goal<130/90, stable monitored by PCP HLD with LDL goal<70; prior LDL 31 on Crestor 40 mg daily. Request ongoing monitoring/management by PCP DM with A1c goal<7; A1c 6.9.  Routinely followed by endocrinology  Carotid stenosis, right, asymptomatic: repeat carotid duplex. Prior duplex 09/2020 right ICA 40 to 59% stenosis, left ICA 1 to 39% stenosis  At risk for sleep apnea: will reschedule initial evaluation for sleep apnea. Referral placed at prior visit (10/2020) and no showed initial consult with Dr. Brett Fairy 03/5850  Complicated migraine:  No additional migraine headaches Continue Topamax with dosage adjustment as above    Follow up in 6 months or call earlier if needed   CC:  [name], NP    I spent 37 minutes of face-to-face and non-face-to-face time with patient and husband.  This included previsit chart review, lab review, study review, order entry, electronic health record documentation, patient education and discussion regarding hx of stroke with residual LLE weakness and headache,  hx  of complicated migraine vs TIA, ongoing use of topiramate for migraine prophylaxis and dosage adjustment, importance of managing stroke risk factors and secondary stroke prevention measures and answered all other questions to patient and husbands satisfaction  Frann Rider, AGNP-BC  [facility] [address] [place], [place] [address]  Phone [phone] Fax [phone] Note: This document was prepared with digital dictation and possible smart phrase technology. Any transcriptional errors that result from this process are unintentional.

## 2021-04-08 NOTE — Telephone Encounter (Signed)
Patient advised of results, provider's advise and new rx for vitamin D. She will keep 4-28th appointment

## 2021-04-10 ENCOUNTER — Telehealth: Payer: Self-pay | Admitting: Physician Assistant

## 2021-04-10 NOTE — Telephone Encounter (Signed)
Patient is calling for a referral appointment from [name], NP.  Patient is scheduled for 11/21/2021 at 10:30 with [facility], PA-C. ?

## 2021-04-10 NOTE — Telephone Encounter (Signed)
Referral attached to appointment

## 2021-04-12 ENCOUNTER — Telehealth: Payer: Self-pay | Admitting: Adult Health

## 2021-04-12 ENCOUNTER — Telehealth: Payer: Self-pay | Admitting: Family

## 2021-04-12 NOTE — Telephone Encounter (Signed)
I called pt and relayed instructions from NP. She verbalized understanding and agreement to plan. ?She also wanted to see if she could be scheduled for therapy with neuro rehab. She sts she has worked with them before and they have helped with her symptoms.  ?I provided the number to the pt for her to call and see about setting up an appt with them ( per epic last visit was in June of 2022). ? ?Pt will call back if she needed a referral for visits to take place.  ? ? ?

## 2021-04-12 NOTE — Telephone Encounter (Signed)
Gwen, could you please see if you could get her in sooner at another Dermatology practice?  ?

## 2021-04-12 NOTE — Telephone Encounter (Signed)
Called patient and clarified she's taking topamax 50 mg two times daily. Last episode of vertigo was over a year ago, and she had PT for it. She is asking if extra dose of topamax could have caused vertigo to return. I advised will send message to NP and call her back. Patient verbalized understanding, appreciation. ? ?

## 2021-04-12 NOTE — Telephone Encounter (Signed)
Pt said she forgot mention each time husband tries correct my vertigo pt throws up. Had to set up all night because could not lay down. Still dizzy from the vertigo. Would like a call from the nurse ?

## 2021-04-12 NOTE — Telephone Encounter (Signed)
Pt stated Dermatology office could not see her until October. She is hoping another referral can be placed to be seen sooner. Please advise.  ?

## 2021-04-12 NOTE — Telephone Encounter (Signed)
Pt had vertigo problems last night and currently, pt feels it may be from the topiramate (TOPAMAX) 50 MG tablet.  Pt would like a call back to discuss.  ?

## 2021-04-12 NOTE — Telephone Encounter (Signed)
Try to decrease topamax back to prior dosage and see if vertigo resolves. If vertigo worsens or is different from previously experienced vertigo, would advise to proceed to ED immediately for further evaluation. ?

## 2021-04-13 ENCOUNTER — Emergency Department (HOSPITAL_BASED_OUTPATIENT_CLINIC_OR_DEPARTMENT_OTHER)
Admission: EM | Admit: 2021-04-13 | Discharge: 2021-04-13 | Disposition: A | Discharge status: Home / Self Care | Payer: Medicare HMO | Attending: Emergency Medicine | Admitting: Emergency Medicine

## 2021-04-13 ENCOUNTER — Emergency Department (HOSPITAL_BASED_OUTPATIENT_CLINIC_OR_DEPARTMENT_OTHER): Payer: Medicare HMO

## 2021-04-13 ENCOUNTER — Encounter (HOSPITAL_BASED_OUTPATIENT_CLINIC_OR_DEPARTMENT_OTHER): Payer: Self-pay

## 2021-04-13 ENCOUNTER — Other Ambulatory Visit: Payer: Self-pay

## 2021-04-13 DIAGNOSIS — H81399 Other peripheral vertigo, unspecified ear: Secondary | ICD-10-CM | POA: Diagnosis not present

## 2021-04-13 DIAGNOSIS — Z794 Long term (current) use of insulin: Secondary | ICD-10-CM | POA: Diagnosis not present

## 2021-04-13 DIAGNOSIS — I10 Essential (primary) hypertension: Secondary | ICD-10-CM | POA: Diagnosis not present

## 2021-04-13 DIAGNOSIS — G319 Degenerative disease of nervous system, unspecified: Secondary | ICD-10-CM | POA: Diagnosis not present

## 2021-04-13 DIAGNOSIS — I6782 Cerebral ischemia: Secondary | ICD-10-CM | POA: Diagnosis not present

## 2021-04-13 DIAGNOSIS — R42 Dizziness and giddiness: Secondary | ICD-10-CM | POA: Diagnosis not present

## 2021-04-13 DIAGNOSIS — I639 Cerebral infarction, unspecified: Secondary | ICD-10-CM | POA: Diagnosis not present

## 2021-04-13 DIAGNOSIS — Z7984 Long term (current) use of oral hypoglycemic drugs: Secondary | ICD-10-CM | POA: Diagnosis not present

## 2021-04-13 DIAGNOSIS — E119 Type 2 diabetes mellitus without complications: Secondary | ICD-10-CM | POA: Insufficient documentation

## 2021-04-13 DIAGNOSIS — R519 Headache, unspecified: Secondary | ICD-10-CM | POA: Diagnosis not present

## 2021-04-13 LAB — CBC WITH DIFFERENTIAL/PLATELET
Abs Immature Granulocytes: 0.01 10*3/uL (ref 0.00–0.07)
Basophils Absolute: 0 10*3/uL (ref 0.0–0.1)
Basophils Relative: 1 %
Eosinophils Absolute: 0.1 10*3/uL (ref 0.0–0.5)
Eosinophils Relative: 3 %
HCT: 39.6 % (ref 36.0–46.0)
Hemoglobin: 12.8 g/dL (ref 12.0–15.0)
Immature Granulocytes: 0 %
Lymphocytes Relative: 24 %
Lymphs Abs: 1.2 10*3/uL (ref 0.7–4.0)
MCH: 29.5 pg (ref 26.0–34.0)
MCHC: 32.3 g/dL (ref 30.0–36.0)
MCV: 91.2 fL (ref 80.0–100.0)
Monocytes Absolute: 0.3 10*3/uL (ref 0.1–1.0)
Monocytes Relative: 6 %
Neutro Abs: 3.2 10*3/uL (ref 1.7–7.7)
Neutrophils Relative %: 66 %
Platelets: 193 10*3/uL (ref 150–400)
RBC: 4.34 MIL/uL (ref 3.87–5.11)
RDW: 12.5 % (ref 11.5–15.5)
WBC: 4.9 10*3/uL (ref 4.0–10.5)
nRBC: 0 % (ref 0.0–0.2)

## 2021-04-13 LAB — URINALYSIS, ROUTINE W REFLEX MICROSCOPIC
Bilirubin Urine: NEGATIVE
Glucose, UA: NEGATIVE mg/dL
Hgb urine dipstick: NEGATIVE
Ketones, ur: NEGATIVE mg/dL
Nitrite: NEGATIVE
Protein, ur: NEGATIVE mg/dL
Specific Gravity, Urine: 1.01 (ref 1.005–1.030)
pH: 6.5 (ref 5.0–8.0)

## 2021-04-13 LAB — COMPREHENSIVE METABOLIC PANEL
ALT: 13 U/L (ref 0–44)
AST: 15 U/L (ref 15–41)
Albumin: 4.2 g/dL (ref 3.5–5.0)
Alkaline Phosphatase: 64 U/L (ref 38–126)
Anion gap: 8 (ref 5–15)
BUN: 16 mg/dL (ref 8–23)
CO2: 25 mmol/L (ref 22–32)
Calcium: 9.2 mg/dL (ref 8.9–10.3)
Chloride: 107 mmol/L (ref 98–111)
Creatinine, Ser: 0.8 mg/dL (ref 0.44–1.00)
GFR, Estimated: >60 mL/min (ref >60)
Glucose, Bld: 136 mg/dL — ABNORMAL HIGH (ref 70–99)
Potassium: 4.1 mmol/L (ref 3.5–5.1)
Sodium: 140 mmol/L (ref 135–145)
Total Bilirubin: 0.8 mg/dL (ref 0.3–1.2)
Total Protein: 7.2 g/dL (ref 6.5–8.1)

## 2021-04-13 LAB — URINALYSIS, MICROSCOPIC (REFLEX)

## 2021-04-13 MED ORDER — MECLIZINE HCL 25 MG PO TABS
50.0000 mg | ORAL_TABLET | Freq: Once | ORAL | Status: AC
  Administered 2021-04-13: 50 mg via ORAL
  Filled 2021-04-13: qty 2

## 2021-04-13 MED ORDER — MECLIZINE HCL 25 MG PO TABS: 25.0000 mg | ORAL_TABLET | Freq: Three times a day (TID) | ORAL | 0 refills | Status: DC | PRN

## 2021-04-13 NOTE — ED Notes (Signed)
Pt verbalized understanding to pick up prescriptions at pharmacy listed on d/c instructions.  ?

## 2021-04-13 NOTE — ED Provider Notes (Signed)
[facility] DEPART[facility] Provider Note   CSN: [id number] Arrival date & time: 04/13/21  2778     History  Chief Complaint  Patient presents with   Dizziness    Jacqueline [name] is a 71 y.o. female.  HPI     71 year old female with a history of diabetes, hyperlipidemia, hypertension, nephrolithiasis, TIA who presents with concern for vertigo.   For 2 nights has had severe vertigo. Has felt similar to vertigo she has had in the past but trying to manage it at home without relief.  Tried Epley maneuver without relief this time, has helped in the past.  Has been on and off for 2 years, last time saw vestibular therapist connected to the Neurologist.  Room spinning began 2 nights ago, first night severe with nausea and vomiting, has had nausea since then Worse when going to lay down and head movements.  Is better with laying still. Right now is not having any symptoms of dizziness, but if she were to turn her head it would bring it on again.  Denies numbness, weakness, difficulty talking, facial droop.  Has resolved weakness from previous stroke/TIA.   Now having difficulty walking because of vertigo, balance feeling off.  Feels like vision is not as clear as it was on the phone. No double vision. No difficulty talking. No new numbness (prior numbness from neuropathy/DM toes and fingers)  Headache off and on since August ,but worse now left sided.    No syncope.  No cp/dyspnea/abd pain/black or bloody stools.  Last 2-3 days more frequent BM.  Hx of plavix for stroke, kept having headaches, '50mg'$  at night and '50mg'$  in the AM this week, because of headaches (?) after review of notes and discussion of patient she thinks she was taking the topiramate '50mg'$  NOT the plavix twice a day  Because of dizziness, stopped the topiramate because not sure if it contributed to dizziness    PER NEURO NOTE: Recent right parietal subcortical stroke History of prior stroke and possible  TIA:  Intermittent LLE weakness and gait impairment - wishes to do strengthening exercises on her own. Advised to call if she wishes to pursue PT.  Headache post stroke: short lasting sharp zap sensation. Increase topiramate to '50mg'$  BID. Discussed potential side effects and to call with any concerns or call after 1 week if no benefit  Continue clopidogrel 75 mg daily and Crestor '40mg'$  daily for secondary stroke prevention.  Ensure close PCP and endocrinology follow-up for aggressive stroke risk factor management with  HTN with BP goal<130/90, stable monitored by PCP HLD with LDL goal<70; prior LDL 31 on Crestor 40 mg daily. Request ongoing monitoring/management by PCP DM with A1c goal<7; A1c 6.9.  Routinely followed by endocrinology   Past Medical History:  Diagnosis Date   Diabetes mellitus    HLD (hyperlipidemia)    Hypertension    Nephrolithiasis    Obese    TIA (transient ischemic attack) 2020    Home Medications Prior to Admission medications   Medication Sig Start Date End Date Taking? Authorizing Provider  meclizine (ANTIVERT) 25 MG tablet Take 1 tablet (25 mg total) by mouth 3 (three) times daily as needed for dizziness. 04/13/21  Yes Gareth Morgan, MD  clopidogrel (PLAVIX) 75 MG tablet Take 1 tablet (75 mg total) by mouth daily. 10/16/20   Frann Rider, NP  Dextromethorphan-guaiFENesin 10-100 MG/5ML liquid Take 5 mLs by mouth every 12 (twelve) hours. 03/01/21   Marrian Salvage, [place]  glucose blood (ACCU-CHEK GUIDE) test strip Use as instructed 01/24/21   Shamleffer, Melanie Crazier, MD  Insulin Pen Needle (BD PEN NEEDLE NANO 2ND GEN) 32G X 4 MM MISC USE DAILY AS DIRECTED 03/20/21   Shamleffer, Melanie Crazier, MD  ipratropium (ATROVENT) 0.03 % nasal spray Place 2 sprays into both nostrils every 12 (twelve) hours. 03/01/21   Marrian Salvage, FNP  Lancets (ACCU-CHEK MULTICLIX) lancets Use as instructed to test blood sugars 3 times daily E11.65 10/04/18   Shamleffer, Melanie Crazier, MD  LEVEMIR FLEXTOUCH 100 UNIT/ML FlexTouch Pen INJECT 20 UNITS INTO THE SKIN DAILY. 02/13/21   Shamleffer, Melanie Crazier, MD  losartan (COZAAR) 25 MG tablet TAKE 1 TABLET (25 MG TOTAL) BY MOUTH DAILY. 01/14/21   [name], NP  metFORMIN (GLUCOPHAGE) 1000 MG tablet Take 1 tablet (1,000 mg total) by mouth 2 (two) times daily with a meal. 12/24/20   Shamleffer, Melanie Crazier, MD  rosuvastatin (CRESTOR) 40 MG tablet Take 1 tablet (40 mg total) by mouth at bedtime. 09/13/20   Pokhrel, Corrie Mckusick, MD  topiramate (TOPAMAX) 50 MG tablet Take 1 tablet (50 mg total) by mouth 2 (two) times daily. 04/08/21   Frann Rider, NP  Vitamin D, Ergocalciferol, (DRISDOL) 1.25 MG (50000 UNIT) CAPS capsule Take 1 capsule (50,000 Units total) by mouth every 7 (seven) days. 01/21/21   [name], NP      Allergies    Augmentin [amoxicillin-pot clavulanate], Definity [perflutren lipid microsphere], Novocain [procaine], Promethazine, and Prozac [fluoxetine hcl]    Review of Systems   Review of Systems  Physical Exam Updated Vital Signs BP 128/66 (BP Location: Left Arm)    Pulse 65    Temp 97.9 F (36.6 C) (Oral)    Resp 16    Ht '5\' 5"'$  (1.651 m)    Wt 83.9 kg    SpO2 99%    BMI 30.79 kg/m  Physical Exam Vitals and nursing note reviewed.  Constitutional:      General: She is not in acute distress.    Appearance: Normal appearance. She is well-developed. She is not ill-appearing or diaphoretic.  HENT:     Head: Normocephalic and atraumatic.  Eyes:     General: No visual field deficit.    Extraocular Movements: Extraocular movements intact.     Conjunctiva/sclera: Conjunctivae normal.     Pupils: Pupils are equal, round, and reactive to light.  Cardiovascular:     Rate and Rhythm: Normal rate and regular rhythm.  Pulmonary:     Effort: Pulmonary effort is normal. No respiratory distress.  Musculoskeletal:        General: No swelling or tenderness.     Cervical back: Normal range of  motion.  Skin:    General: Skin is warm and dry.     Findings: No erythema or rash.  Neurological:     General: No focal deficit present.     Mental Status: She is alert and oriented to person, place, and time.     GCS: GCS eye subscore is 4. GCS verbal subscore is 5. GCS motor subscore is 6.     Cranial Nerves: No cranial nerve deficit, dysarthria or facial asymmetry.     Sensory: No sensory deficit.     Motor: No weakness or tremor.     Coordination: Coordination abnormal (left hand with past pointing on finger to nose several times). Finger-Nose-Finger Test normal.     Gait: Gait normal.     Comments: Horizontal nystagmus  ED Results / Procedures / Treatments   Labs (all labs ordered are listed, but only abnormal results are displayed) Labs Reviewed  COMPREHENSIVE METABOLIC PANEL - Abnormal; Notable for the following components:      Result Value   Glucose, Bld 136 (*)    All other components within normal limits  URINALYSIS, ROUTINE W REFLEX MICROSCOPIC - Abnormal; Notable for the following components:   Color, Urine STRAW (*)    Leukocytes,Ua SMALL (*)    All other components within normal limits  URINALYSIS, MICROSCOPIC (REFLEX) - Abnormal; Notable for the following components:   Bacteria, UA RARE (*)    All other components within normal limits  CBC WITH DIFFERENTIAL/PLATELET    EKG EKG Interpretation  Date/Time:  Saturday April 13 2021 10:01:56 EST Ventricular Rate:  61 PR Interval:  148 QRS Duration: 89 QT Interval:  454 QTC Calculation: 458 R Axis:   6 Text Interpretation: Sinus rhythm No significant change since last tracing Confirmed by Gareth Morgan [id number]) on 04/13/2021 10:04:57 AM  Radiology CT Head Wo Contrast  Result Date: 04/13/2021 CLINICAL DATA:  71 year old female with history of dizziness for the past 2 nights. EXAM: CT HEAD WITHOUT CONTRAST TECHNIQUE: Contiguous axial images were obtained from the base of the skull through the vertex without  intravenous contrast. RADIATION DOSE REDUCTION: This exam was performed according to the departmental dose-optimization program which includes automated exposure control, adjustment of the mA and/or kV according to patient size and/or use of iterative reconstruction technique. COMPARISON:  Head CT 09/12/2020. FINDINGS: Brain: Mild cerebral atrophy. Patchy and confluent areas of decreased attenuation are noted throughout the deep and periventricular white matter of the cerebral hemispheres bilaterally, compatible with chronic microvascular ischemic disease. No evidence of acute infarction, hemorrhage, hydrocephalus, extra-axial collection or mass lesion/mass effect. Vascular: No hyperdense vessel or unexpected calcification. Skull: Normal. Negative for fracture or focal lesion. Sinuses/Orbits: No acute finding. Other: None. IMPRESSION: 1. No acute intracranial abnormalities. 2. Mild cerebral atrophy with mild chronic microvascular ischemic changes in the cerebral white matter, as above. Electronically Signed   By: Vinnie Langton M.D.   On: 04/13/2021 11:19   MR BRAIN WO CONTRAST  Result Date: 04/13/2021 CLINICAL DATA:  Provided history: Stroke, follow-up. Additional history provided: Headache and vertigo for 2 days. History of prior CVA. EXAM: MRI HEAD WITHOUT CONTRAST TECHNIQUE: Multiplanar, multiecho pulse sequences of the brain and surrounding structures were obtained without intravenous contrast. COMPARISON:  Prior head CT examinations 04/13/2021 and earlier. Brain MRI 09/12/2020. FINDINGS: Brain: No age advanced or lobar predominant cerebral atrophy. Redemonstration of small chronic cortical/subcortical infarcts within the right frontal and bilateral parietal lobes. Redemonstration of multiple small chronic infarcts within the right centrum semiovale/corona radiata. Background mild multifocal T2 FLAIR hyperintense signal abnormality within the cerebral white matter, nonspecific but compatible chronic small  vessel ischemic disease. There is no acute infarct. No evidence of an intracranial mass. No chronic intracranial blood products. No extra-axial fluid collection. No midline shift. Vascular: Maintained flow voids within the proximal large arterial vessels. Skull and upper cervical spine: No focal suspicious marrow lesion. Sinuses/Orbits: Visualized orbits show no acute finding. Bilateral ocular lens replacements. Minimal mucosal thickening within the bilateral ethmoid sinuses. IMPRESSION: No evidence of acute intracranial abnormality. Redemonstration of small chronic cortical/subcortical infarcts within the right frontal and bilateral parietal lobes. Redemonstration of multiple small chronic infarcts within the right centrum semiovale/corona radiata. Background mild chronic small vessel ischemic changes within the cerebral white matter. Minimal mucosal thickening within the bilateral  ethmoid sinuses. Electronically Signed   By: Kellie Simmering D.O.   On: 04/13/2021 12:03    Procedures Procedures    Medications Ordered in ED Medications  meclizine (ANTIVERT) tablet 50 mg (50 mg Oral Given 04/13/21 1148)    ED Course/ Medical Decision Making/ A&P      71 year old female with a history of diabetes, hyperlipidemia, hypertension, nephrolithiasis, TIA/CVA who presents with concern for vertigo.   Differential diagnosis includes anemia, electrolyte abnormality, cardiac abnormality, infection, hypothyroidism, other toxic/metabolic abnormalities, CVA, ICH, peripheral vertigo, complicated migraine.  Labs were personally evaluated by me and show no clinically significant anemia or electrolyte abnormalities.  CT head completed given history of headache on Plavix to evaluate for intracranial hemorrhage as etiology evaluate me and shows no acute findings.  While she has some historical features which may be seen with peripheral vertigo, she has not improved with home Epley maneuvers, has difficulty walking,  visual changes, some past-pointing with finger-to-nose on the left, as well as history of TIA and stroke risk factors, and will proceed with MRI for further evaluation for central etiology of dizziness.  MRI completed and shows no evidence of acute intracranial abnormality, redemonstrated small chronic cortical/subcortical infarcts, small vessel changes.  In setting of normal MRI, do feel it is most likely symptoms related to peripheral vertigo.  Given rx for meclizine, recommend outpt follow up, epleys, vestibular rehab. Patient discharged in stable condition with understanding of reasons to return.        Final Clinical Impression(s) / ED Diagnoses Final diagnoses:  Peripheral vertigo, unspecified laterality    Rx / DC Orders ED Discharge Orders          Ordered    meclizine (ANTIVERT) 25 MG tablet  3 times daily PRN        04/13/21 1304    Ambulatory Referral to Neuro Rehab       Comments: Vestibular rehab?   04/13/21 1306              Gareth Morgan, MD 04/14/21 2121

## 2021-04-13 NOTE — ED Triage Notes (Signed)
Pt arrives with c/o dizziness starting 2 nights ago. Pt reports history of vertigo, has been attempting to manage at home but unable to do so. States that she has had some NV with room spinning around her. Does not have any medication for vertigo at home.  ?

## 2021-04-13 NOTE — ED Notes (Signed)
Patient transported to CT 

## 2021-04-13 NOTE — ED Notes (Signed)
Pt transported from CT to MRI ?

## 2021-04-14 ENCOUNTER — Encounter: Payer: Self-pay | Admitting: Adult Health

## 2021-04-15 ENCOUNTER — Telehealth: Payer: Self-pay | Admitting: Family

## 2021-04-15 ENCOUNTER — Encounter: Payer: Self-pay | Admitting: Family

## 2021-04-15 DIAGNOSIS — H8110 Benign paroxysmal vertigo, unspecified ear: Secondary | ICD-10-CM

## 2021-04-15 NOTE — Telephone Encounter (Signed)
Pt's husband states pt visited the ED last week & was referred to rehabilitation at [facility]  ? ?Laurell Josephs states no diagnoses on referral which makes it hard for insurance to be filed.  ? ?Pt's husband will like to know if Nurse Practitioner, Lenna Sciara would resend the referral for vertigo. Pt's husband would like for someone to contact [phone] ? ?Please advise  ?

## 2021-04-15 NOTE — Telephone Encounter (Signed)
We have not previously discussed vertigo at prior visit, I did review ED visit. MR brain unremarkable. They did place referral  to [facility] rehab for vestibular therapy which I agree with. If symptoms persist, may benefit from seeing ENT which PCP can refer to. Thank you.  ?

## 2021-04-16 NOTE — Telephone Encounter (Signed)
Called but n/a lvm ?

## 2021-04-16 NOTE — Telephone Encounter (Signed)
Referral has been placed. 

## 2021-04-16 NOTE — Telephone Encounter (Signed)
Gwen, can you please make sure that this gets faxed to them asap? See message. Tks.  ?

## 2021-04-17 ENCOUNTER — Other Ambulatory Visit: Payer: Self-pay | Admitting: Family

## 2021-04-17 ENCOUNTER — Encounter: Payer: Self-pay | Admitting: Physical Therapy

## 2021-04-17 ENCOUNTER — Other Ambulatory Visit: Payer: Self-pay

## 2021-04-17 ENCOUNTER — Ambulatory Visit: Payer: Medicare HMO | Attending: Family | Admitting: Physical Therapy

## 2021-04-17 DIAGNOSIS — H8111 Benign paroxysmal vertigo, right ear: Secondary | ICD-10-CM | POA: Insufficient documentation

## 2021-04-17 DIAGNOSIS — R2681 Unsteadiness on feet: Secondary | ICD-10-CM | POA: Diagnosis not present

## 2021-04-17 DIAGNOSIS — H8112 Benign paroxysmal vertigo, left ear: Secondary | ICD-10-CM | POA: Diagnosis not present

## 2021-04-17 DIAGNOSIS — R42 Dizziness and giddiness: Secondary | ICD-10-CM | POA: Insufficient documentation

## 2021-04-17 DIAGNOSIS — H8110 Benign paroxysmal vertigo, unspecified ear: Secondary | ICD-10-CM | POA: Diagnosis not present

## 2021-04-17 NOTE — Therapy (Signed)
[facility] [place], [place], [address] Phone: [phone]   Fax:  [phone]  Physical Therapy Evaluation  Patient Details  Name: Jacqueline [name] MRN: [id number] Date of Birth: 1950/08/14 Referring Provider (PT): [name]   Encounter Date: 04/17/2021   PT End of Session - 04/17/21 1157     Visit Number 1    Number of Visits 5    Date for PT Re-Evaluation 05/15/21    Authorization Type [employer]    Authorization Time Period 04/17/21 to 05/15/21    Progress Note Due on Visit 10    PT Start Time 1103    PT Stop Time 1146    PT Time Calculation (min) 43 min    Activity Tolerance Patient tolerated treatment well    Behavior During Therapy [facility] for tasks assessed/performed             Past Medical History:  Diagnosis Date   Diabetes mellitus    HLD (hyperlipidemia)    Hypertension    Nephrolithiasis    Obese    TIA (transient ischemic attack) 2020    Past Surgical History:  Procedure Laterality Date   ROTATOR CUFF REPAIR  2009   TUBAL LIGATION  1979    There were no vitals filed for this visit.    Subjective Assessment - 04/17/21 1105     Subjective I started getting really bad vertigo on Thursday, my husband and I tried doing the Epley a few times; I ended up getting really nauseous and vomiting, which I've never done before with past bouts of vertigo. Friday I was nauseated and couldn't turn my head, we did it twice again, no vomiting that time but still really unsteady. I talked to my doctor's assistant, they sent me to the ED and we ruled out stroke. My doctor then referred me here. I had this same thing happen last summer. I am on meclizine right now, I started two days ago and my last dose was last night, I didn't take one today.    Diagnostic tests imaging negative for acute CVA    Patient Stated Goals Resolve the vertigo    Currently in Pain? No/denies   dizziness now 2-3/10                [facility] PT Assessment - 04/17/21 0001       Assessment   Medical Diagnosis BPPV    Referring Provider (PT) [name]    Onset Date/Surgical Date --   last Thursday   Next MD Visit unsure    Prior Therapy PT here in the past for vertigo      Precautions   Precautions None      Restrictions   Weight Bearing Restrictions No      Balance Screen   Has the patient fallen in the past 6 months No    Has the patient had a decrease in activity level because of a fear of falling?  Yes    Is the patient reluctant to leave their home because of a fear of falling?  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 Left side with much stronger horizontal beats but also  noted milder R horizontal beats with exam as well, may need to considering treating R canal as well                        Objective measurements completed on examination: See above findings.        Vestibular Treatment/Exercise - 04/17/21 0001       Vestibular Treatment/Exercise   Vestibular Treatment Provided Canalith Repositioning    Canalith Repositioning Epley Manuever Left;Canal Roll Left       EPLEY MANUEVER LEFT   Number of Reps  1    Overall Response  No change     RESPONSE DETAILS LEFT nystagmus remained at consistent level, dizziness stayed around 3/10 with no improvement in nystagmus      Canal Roll Left   Number of Reps  1    Overall Response  Improved Symptoms    Response Details  dizziness improved to 1/10, much more steady on feet                    PT Education - 04/17/21 1156     Education Details vestibular appratus anatomy especially posterior vs horizontal canals and differences in maneuvers to handle  both areas; HEP for L BBQ roll; slight concern there may be multple canals involved, definitely want her to return so we can further assess and treat. POC moving forward    Person(s) Educated Patient    Methods Explanation;Handout    Comprehension Verbalized understanding              PT Short Term Goals - 04/17/21 1207       PT SHORT TERM GOAL #1   Title Will be independent with appropriate vertigo HEP    Time 1    Period Weeks    Status New    Target Date 04/24/21      PT SHORT TERM GOAL #2   Title Dizziness to be no more than 1/10    Time 1    Period Weeks    Status New               PT Long Term Goals - 04/17/21 1207       PT LONG TERM GOAL #1   Title Vertigo to be completely resolved    Time 4    Period Weeks    Status New    Target Date 05/15/21      PT LONG TERM GOAL #2   Title Will be able to ambulate unlimited distances in community without unsteadiness/will have minimal fall risk with mobility    Time 4    Period Weeks    Status New                    Plan - 04/17/21 1158     Clinical Impression Statement Jacqueline [name] arrives today clearly not feeling well; she had a sudden onset of vertigo recently which she and her husband tried to treat with repeated Epley however this did not significantly improve symptoms. Did not start feeling better until she took some Meclizine last night. Exam does seem to show very mild rotatory beats left, but the majority of nystagmus noted today was horizontal towards L ear. Some of her symptoms were consistent with posterior canal BPPV, so I did perform L Epley to try to help this but we had no change in symptoms with this maneuver. I then opted to try BBQ roll for  L ear horizontal canal which reduced vertigo from 3/10 to 1/10 with patient reporting significant improvement in symptoms and much more steady on her feet. I definitely think she needs to come back so we can further rule out possible multiple canal  involvement, it might be possible R ear may have problems in the horizontal canal as well as I did see some R beating non-rotatory nystagmus a couple of times too. Will continue to treat and progress as able, hopefully we will be able to resolve her remaining symptoms quickly.    Personal Factors and Comorbidities Comorbidity 1    Comorbidities h/o TIA, DM type 2, migraine    Examination-Activity Limitations Locomotion Level;Reach Overhead;Transfers    Examination-Participation Restrictions Cleaning;Community Activity;Driving;Interpersonal Relationship;Laundry;Shop;Meal Prep    Stability/Clinical Decision Making Stable/Uncomplicated    Clinical Decision Making Low    Rehab Potential Good    PT Frequency Other (comment)   1-2x/week to allow for schedule flexibility   PT Duration 4 weeks    PT Treatment/Interventions ADLs/Self Care Home Management;Canalith Repostioning;Balance training;Neuromuscular re-education;Therapeutic activities;Therapeutic exercise;Patient/family education;Gait training;Manual techniques    PT Next Visit Plan recheck nystagmus- continue with BBQ roll if this is still helping, furhter exam R ear canals    PT Home Exercise Plan L ear BBQ roll maneuver    Consulted and Agree with Plan of Care Patient             Patient will benefit from skilled therapeutic intervention in order to improve the following deficits and impairments:  Dizziness, Decreased balance  Visit Diagnosis: BPPV (benign paroxysmal positional vertigo), left  Dizziness and giddiness  BPPV (benign paroxysmal positional vertigo), right  Unsteadiness on feet     Problem List Patient Active Problem List   Diagnosis Date Noted   Vitamin D deficiency 04/03/2021   Pain of lower extremity 01/25/2021   Skin lesion 12/07/2020   Allergic drug reaction 11/07/2020   Injury of left great toe 11/07/2020   Anxiety and depression 10/09/2020   Soft tissue mass 10/02/2020   Radiculopathy of thoracic  region 10/02/2020   Type 2 diabetes mellitus with hyperglycemia, with long-term current use of insulin ([place]) 09/25/2020   Type 2 diabetes mellitus with diabetic polyneuropathy, with long-term current use of insulin ([place]) 09/25/2020   History of CVA (cerebrovascular accident) 09/12/2020   Hypertension 09/12/2020   Hematuria 12/29/2018   Migraine without status migrainosus, not intractable 12/29/2018   Trigeminal neuralgia 12/29/2018   TIA (transient ischemic attack) 07/13/2018   Left-sided weakness 07/13/2018   HLD (hyperlipidemia) 11/25/2013   DM type 2 (diabetes mellitus, type 2) ([place]) 11/25/2013   Ann Lions PT, DPT, PN2   Supplemental Physical Therapist [place]. [place], [place], [address] Phone: [phone]   Fax:  [phone]  Name: Jacqueline [name] MRN: [id number] Date of Birth: Aug 24, 1950

## 2021-04-18 DIAGNOSIS — Z961 Presence of intraocular lens: Secondary | ICD-10-CM | POA: Diagnosis not present

## 2021-04-18 DIAGNOSIS — H40013 Open angle with borderline findings, low risk, bilateral: Secondary | ICD-10-CM | POA: Diagnosis not present

## 2021-04-18 DIAGNOSIS — E119 Type 2 diabetes mellitus without complications: Secondary | ICD-10-CM | POA: Diagnosis not present

## 2021-04-18 DIAGNOSIS — H35363 Drusen (degenerative) of macula, bilateral: Secondary | ICD-10-CM | POA: Diagnosis not present

## 2021-04-23 ENCOUNTER — Other Ambulatory Visit: Payer: Self-pay

## 2021-04-23 ENCOUNTER — Ambulatory Visit (HOSPITAL_COMMUNITY)
Admission: RE | Admit: 2021-04-23 | Discharge: 2021-04-23 | Disposition: A | Discharge status: Home / Self Care | Payer: Medicare HMO | Source: Ambulatory Visit | Attending: Adult Health | Admitting: Adult Health

## 2021-04-23 DIAGNOSIS — I6523 Occlusion and stenosis of bilateral carotid arteries: Secondary | ICD-10-CM | POA: Insufficient documentation

## 2021-04-23 NOTE — Progress Notes (Signed)
Carotid duplex has been completed.  ? ?Preliminary results in CV Proc.  ? ?Tiersa Dayley Kyriaki Moder ?04/23/2021 9:06 AM    ?

## 2021-04-26 ENCOUNTER — Encounter: Payer: Self-pay | Admitting: Physical Therapy

## 2021-04-26 ENCOUNTER — Other Ambulatory Visit: Payer: Self-pay

## 2021-04-26 ENCOUNTER — Ambulatory Visit: Payer: Medicare HMO | Admitting: Physical Therapy

## 2021-04-26 DIAGNOSIS — R2681 Unsteadiness on feet: Secondary | ICD-10-CM

## 2021-04-26 DIAGNOSIS — H8112 Benign paroxysmal vertigo, left ear: Secondary | ICD-10-CM | POA: Diagnosis not present

## 2021-04-26 DIAGNOSIS — R42 Dizziness and giddiness: Secondary | ICD-10-CM

## 2021-04-26 DIAGNOSIS — H8111 Benign paroxysmal vertigo, right ear: Secondary | ICD-10-CM | POA: Diagnosis not present

## 2021-04-26 DIAGNOSIS — H8110 Benign paroxysmal vertigo, unspecified ear: Secondary | ICD-10-CM | POA: Diagnosis not present

## 2021-04-26 NOTE — Therapy (Signed)
[place] ?[address] ?[place], [place], [address] ?Phone: [phone]   Fax:  [phone] ? ?Physical Therapy Treatment ? ?Patient Details  ?Name: Jacqueline [name] ?MRN: [id number] ?Date of Birth: March 21, 1950 ?Referring Provider (PT): [name] ? ? ?Encounter Date: 04/26/2021 ? ? PT End of Session - 04/26/21 1134   ? ? Visit Number 2   ? Date for PT Re-Evaluation 05/15/21   ? Authorization Type [employer] Medicare   ? PT Start Time 1055   ? PT Stop Time 1135   ? PT Time Calculation (min) 40 min   ? Activity Tolerance Patient tolerated treatment well   ? Behavior During Therapy [facility] for tasks assessed/performed   ? ?  ?  ? ?  ? ? ?Past Medical History:  ?Diagnosis Date  ? Diabetes mellitus   ? HLD (hyperlipidemia)   ? Hypertension   ? Nephrolithiasis   ? Obese   ? TIA (transient ischemic attack) 2020  ? ? ?Past Surgical History:  ?Procedure Laterality Date  ? ROTATOR CUFF REPAIR  2009  ? TUBAL LIGATION  1979  ? ? ?There were no vitals filed for this visit. ? ? Subjective Assessment - 04/26/21 1109   ? ? Subjective Patient reports that she is feeling better, less dizziness overall, i think this helped   ? ?  ?  ? ?  ? ? ? ? ? ? ? ? ? ? Vestibular Assessment - 04/26/21 0001   ? ?  ? Oculomotor Exam  ? Gaze-induced  --   reported some issues of light and blank spaces with this  ? Smooth Pursuits --   some issues to the left nystagmus at end range  ? Saccades Hypometric;Overshoots   ? ?  ?  ? ?  ? ? ? ? ? ? ? ? ? ? ? ? ? ? ? ? ? ? ? ? PT Education - 04/26/21 1132   ? ? Education Details Reviewed the BBQ roll and how it works and why, her and husband feel like they understand this, I did some assessment of the VOR, gave low functioning VOR exercises   ? Person(s) Educated Patient   ? Methods Explanation;Demonstration;Verbal cues;Handout   ? Comprehension Verbalized understanding;Returned demonstration;Verbal cues required   ? ?  ?  ? ?  ? ? ? PT Short Term Goals -  04/26/21 1137   ? ?  ? PT SHORT TERM GOAL #1  ? Title Will be independent with appropriate vertigo HEP   ? Status Partially Met   ?  ? PT SHORT TERM GOAL #2  ? Title Dizziness to be no more than 1/10   ? Status Achieved   ? ?  ?  ? ?  ? ? ? ? PT Long Term Goals - 04/17/21 1207   ? ?  ? PT LONG TERM GOAL #1  ? Title Vertigo to be completely resolved   ? Time 4   ? Period Weeks   ? Status New   ? Target Date 05/15/21   ?  ? PT LONG TERM GOAL #2  ? Title Will be able to ambulate unlimited distances in community without unsteadiness/will have minimal fall risk with mobility   ? Time 4   ? Period Weeks   ? Status New   ? ?  ?  ? ?  ? ? ? ? ? ? ? ? Plan - 04/26/21 1135   ? ? Clinical Impression Statement  Patient reports that she feels better and has not had anymore episodes of dizziness, I reviewed the BBQ roll with her and husband and they feel like they understand it.  I did some assessment of her VOR and found a few issues, gave HEP for low funcitoning VOR nad went over how and why.   ? PT Next Visit Plan if she is fine next visit may hold or D/C   ? Consulted and Agree with Plan of Care Patient   ? ?  ?  ? ?  ? ? ?Patient will benefit from skilled therapeutic intervention in order to improve the following deficits and impairments:  Dizziness, Decreased balance ? ?Visit Diagnosis: ?BPPV (benign paroxysmal positional vertigo), left ? ?Dizziness and giddiness ? ?Unsteadiness on feet ? ? ? ? ?Problem List ?Patient Active Problem List  ? Diagnosis Date Noted  ? Vitamin D deficiency 04/03/2021  ? Pain of lower extremity 01/25/2021  ? Skin lesion 12/07/2020  ? Allergic drug reaction 11/07/2020  ? Injury of left great toe 11/07/2020  ? Anxiety and depression 10/09/2020  ? Soft tissue mass 10/02/2020  ? Radiculopathy of thoracic region 10/02/2020  ? Type 2 diabetes mellitus with hyperglycemia, with long-term current use of insulin ([place]) 09/25/2020  ? Type 2 diabetes mellitus with diabetic polyneuropathy, with long-term current  use of insulin ([place]) 09/25/2020  ? History of CVA (cerebrovascular accident) 09/12/2020  ? Hypertension 09/12/2020  ? Hematuria 12/29/2018  ? Migraine without status migrainosus, not intractable 12/29/2018  ? Trigeminal neuralgia 12/29/2018  ? TIA (transient ischemic attack) 07/13/2018  ? Left-sided weakness 07/13/2018  ? HLD (hyperlipidemia) 11/25/2013  ? DM type 2 (diabetes mellitus, type 2) ([place]) 11/25/2013  ? ? Sumner Boast, PT ?04/26/2021, 11:38 AM ? ?[place] ?[address] ?[address] ?[place], [place], [address] ?Phone: [phone]   Fax:  [phone] ? ?Name: Jacqueline [name] ?MRN: [id number] ?Date of Birth: September 14, 1950 ? ? ? ?

## 2021-04-29 ENCOUNTER — Other Ambulatory Visit: Payer: Self-pay

## 2021-04-29 ENCOUNTER — Encounter: Payer: Self-pay | Admitting: Internal Medicine

## 2021-04-29 ENCOUNTER — Ambulatory Visit: Payer: Medicare HMO | Admitting: Internal Medicine

## 2021-04-29 VITALS — BP 124/76 | HR 67 | Ht 65.0 in | Wt 182.0 lb

## 2021-04-29 DIAGNOSIS — Z794 Long term (current) use of insulin: Secondary | ICD-10-CM | POA: Diagnosis not present

## 2021-04-29 DIAGNOSIS — E1142 Type 2 diabetes mellitus with diabetic polyneuropathy: Secondary | ICD-10-CM

## 2021-04-29 DIAGNOSIS — E1159 Type 2 diabetes mellitus with other circulatory complications: Secondary | ICD-10-CM

## 2021-04-29 DIAGNOSIS — R739 Hyperglycemia, unspecified: Secondary | ICD-10-CM

## 2021-04-29 LAB — POCT GLYCOSYLATED HEMOGLOBIN (HGB A1C): Hemoglobin A1C: 6.8 % — AB (ref 4.0–5.6)

## 2021-04-29 MED ORDER — BD PEN NEEDLE NANO 2ND GEN 32G X 4 MM MISC: 1.0000 | Freq: Every day | 3 refills | Status: DC

## 2021-04-29 MED ORDER — LEVEMIR FLEXPEN 100 UNIT/ML ~~LOC~~ SOPN: 28.0000 [IU] | PEN_INJECTOR | Freq: Every day | SUBCUTANEOUS | 3 refills | Status: DC

## 2021-04-29 MED ORDER — METFORMIN HCL 1000 MG PO TABS: 1000.0000 mg | ORAL_TABLET | Freq: Two times a day (BID) | ORAL | 3 refills | Status: DC

## 2021-04-29 NOTE — Patient Instructions (Signed)
?-   Continue Metformin 1000 mg , 1 tablet Twice a day  ?- Continue  Levemir 28 units daily  ? ? ?HOW TO TREAT LOW BLOOD SUGARS (Blood sugar LESS THAN 70 MG/DL) ?Please follow the RULE OF 15 for the treatment of hypoglycemia treatment (when your (blood sugars are less than 70 mg/dL)  ? ?STEP 1: Take 15 grams of carbohydrates when your blood sugar is low, which includes:  ?3-4 GLUCOSE TABS  OR ?3-4 OZ OF JUICE OR REGULAR SODA OR ?ONE TUBE OF GLUCOSE GEL   ? ?STEP 2: RECHECK blood sugar in 15 MINUTES ?STEP 3: If your blood sugar is still low at the 15 minute recheck --> then, go back to STEP 1 and treat AGAIN with another 15 grams of carbohydrates. ? ?

## 2021-04-29 NOTE — Progress Notes (Signed)
? ?Name: Jacqueline [name]  ?Age/ Sex: 71 y.o., female   ?MRN/ DOB: [id number], Jun 11, 1950    ? ?PCP: [name], NP   ?Reason for Endocrinology Evaluation: Type 2 Diabetes Mellitus  ?Initial Endocrine Consultative Visit: 10/04/2018  ? ? ?PATIENT IDENTIFIER: Jacqueline [name] is a 71 y.o. female with a past medical history of HTN, DM, HLD and migraine headaches. The patient has followed with Endocrinology clinic since 10/04/2018 for consultative assistance with management of her diabetes. ? ?DIABETIC HISTORY:  ?Jacqueline [name] was diagnosed with DM many years ago, she has been on metformin and glipizide for years, insulin started 07/2018. She was prescribed Victoza at some point but she did not start it as her sister had a bad experience with it. Her hemoglobin A1c has ranged from 7.0's % , peaking at 11.2 %in 2020. ? ? ? ?On her initial visit to our clinic, her A1c was 11.2%. She was on Levemir and metformin . We added [place] and stopped Glipizide. Due to hematuria she stopped Jardiance  ? ? ?Rybelsus started 10/2019 but did not start by her next visit in 03/2020 due to planning on eye sx and did not want to start before then? , she was advised to restart by 03/2020 ? ?SUBJECTIVE: stress  ? ?During the last visit (12/24/2020): A1c 6.9 %.  We increased Levemir  and continued Metformin  ? ? ? ? ?Today (04/29/2021): [name] is here for a follow up on diabetes.  She checks her blood sugars 1-4 times a day. The patient has not had hypoglycemic episodes since the last clinic visit.  ? ?She is undergoing PT for vertigo , had an ED visit for Vertigo 04/2021 ? ? ?Weight continues to fluctuate  ?Denies nausea, vomiting or diarrhea  ? ?She would like to stop Metformin because she read so much scary stuff about it.  ? ? ?HOME DIABETES REGIMEN:  ?Levemir 28  units daily  ?Metformin 1000 mg Twice a day  ? ? ? ?METER DOWNLOAD SUMMARY: Unable to download  ?96-274 mg/dL  ? ? ? ? ? ?DIABETIC COMPLICATIONS: ?Microvascular  complications:  ?Neuropathy ?Denies: retinopathy,  CKD ?Last eye exam: Completed 04/2020  ? ? ?  ?Macrovascular complications:  ?TIA ( 03/3555) and 09/2020 ?Denies: CAD, PVD ? ? ? ?HISTORY:  ?Past Medical History:  ?Past Medical History:  ?Diagnosis Date  ? Diabetes mellitus   ? HLD (hyperlipidemia)   ? Hypertension   ? Nephrolithiasis   ? Obese   ? TIA (transient ischemic attack) 2020  ? ?Past Surgical History:  ?Past Surgical History:  ?Procedure Laterality Date  ? ROTATOR CUFF REPAIR  2009  ? TUBAL LIGATION  1979  ? ?Social History:  reports that she has never smoked. She has never used smokeless tobacco. She reports that she does not drink alcohol and does not use drugs. ?Family History:  ?Family History  ?Problem Relation Age of Onset  ? Diabetes Father   ? Heart attack Father 69  ? Heart disease Mother   ?     hx cabg at 40  ? Diabetes type II Brother   ? Parkinson's disease Sister   ? Brain cancer Sister   ? Pancreatic cancer Brother   ? Diabetes Mellitus II Sister   ? Diabetes Mellitus II Sister   ? CAD Sister   ? Diabetes Mellitus II Sister   ? Diabetes Mellitus II Sister   ? Osteoarthritis Daughter   ? AAA (abdominal aortic aneurysm) Daughter   ? ? ? ?  HOME MEDICATIONS: ?Allergies as of 04/29/2021   ? ?   Reactions  ? Augmentin [amoxicillin-pot Clavulanate] Other (See Comments)  ? Altered mental status ?Did it involve swelling of the face/tongue/throat, SOB, or low BP? Unknown ?Did it involve sudden or severe rash/hives, skin peeling, or any reaction on the inside of your mouth or nose? Unknown ?Did you need to seek medical attention at a hospital or doctor's office? Unknown ?When did it last happen? unk       ?If all above answers are ?NO?, may proceed with cephalosporin use.  ? Definity [perflutren Lipid Microsphere] Other (See Comments)  ? Headache and facial flushing after Definity IVP given. No SOB or Chest pain,and Vital signs stable. Irven Baltimore, RN.  ? Novocain [procaine] Other (See Comments)  ?  Altered mental status  ? Promethazine Swelling  ? Tongue swelling  ? Prozac [fluoxetine Hcl] Other (See Comments)  ? Lip swelling x 1 day after 1st dose  ? ?  ? ?  ?Medication List  ?  ? ?  ? Accurate as of April 29, 2021  9:36 AM. If you have any questions, ask your nurse or doctor.  ?  ?  ? ?  ? ?STOP taking these medications   ? ?ipratropium 0.03 % nasal spray ?Commonly known as: ATROVENT ?Stopped by: Dorita Sciara, MD ?  ? ?  ? ?TAKE these medications   ? ?Accu-Chek Guide test strip ?Generic drug: glucose blood ?Use as instructed ?  ?accu-chek multiclix lancets ?Use as instructed to test blood sugars 3 times daily E11.65 ?  ?BD Pen Needle Nano 2nd Gen 32G X 4 MM Misc ?Generic drug: Insulin Pen Needle ?USE DAILY AS DIRECTED ?  ?clopidogrel 75 MG tablet ?Commonly known as: PLAVIX ?Take 1 tablet (75 mg total) by mouth daily. ?  ?Dextromethorphan-guaiFENesin 10-100 MG/5ML liquid ?Take 5 mLs by mouth every 12 (twelve) hours. ?  ?Levemir FlexTouch 100 UNIT/ML FlexPen ?Generic drug: insulin detemir ?INJECT 20 UNITS INTO THE SKIN DAILY. ?  ?losartan 25 MG tablet ?Commonly known as: COZAAR ?TAKE 1 TABLET (25 MG TOTAL) BY MOUTH DAILY. ?  ?meclizine 25 MG tablet ?Commonly known as: ANTIVERT ?Take 1 tablet (25 mg total) by mouth 3 (three) times daily as needed for dizziness. ?  ?metFORMIN 1000 MG tablet ?Commonly known as: GLUCOPHAGE ?Take 1 tablet (1,000 mg total) by mouth 2 (two) times daily with a meal. ?  ?rosuvastatin 40 MG tablet ?Commonly known as: CRESTOR ?Take 1 tablet (40 mg total) by mouth at bedtime. ?  ?topiramate 50 MG tablet ?Commonly known as: TOPAMAX ?Take 1 tablet (50 mg total) by mouth 2 (two) times daily. ?  ?Vitamin D (Ergocalciferol) 1.25 MG (50000 UNIT) Caps capsule ?Commonly known as: DRISDOL ?TAKE 1 CAPSULE EVERY 7 DAYS ?  ? ?  ? ? ? ?OBJECTIVE:  ? ?Vital Signs: BP 124/76 (BP Location: Left Arm, Patient Position: Sitting, Cuff Size: Small)   Pulse 67   Ht '5\' 5"'$  (1.651 m)   Wt 182 lb (82.6  kg)   SpO2 99%   BMI 30.29 kg/m?   ?Wt Readings from Last 3 Encounters:  ?04/29/21 182 lb (82.6 kg)  ?04/13/21 185 lb (83.9 kg)  ?04/08/21 182 lb (82.6 kg)  ? ? ? ?Exam: ?General: Pt appears well and is in NAD  ?Lungs: Clear with good BS bilat with no rales, rhonchi, or wheezes  ?Heart: RRR   ?Extremities: No pretibial edema.  ?Neuro: MS is good with appropriate affect, pt  is alert and Ox3  ? ?  ?  DM foot exam: 09/24/2020 ?  ?The skin of the feet is intact without sores or ulcerations. ?The pedal pulses are 2+ on right and 2+ on left. ?The sensation is intact to a screening 5.07, 10 gram monofilament bilaterally ? ? ? ?DATA REVIEWED: ? ?Lab Results  ?Component Value Date  ? HGBA1C 6.8 (A) 04/29/2021  ? HGBA1C 6.9 (A) 12/24/2020  ? HGBA1C 7.9 (H) 09/13/2020  ? ? ? ?Lab Results  ?Component Value Date  ? CHOL 83 (L) 10/16/2020  ? HDL 31 (L) 10/16/2020  ? [place] 31 10/16/2020  ? TRIG 112 10/16/2020  ? CHOLHDL 2.7 10/16/2020  ?     ? Latest Reference Range & Units 04/13/21 11:57  ?Sodium 135 - 145 mmol/L 140  ?Potassium 3.5 - 5.1 mmol/L 4.1  ?Chloride 98 - 111 mmol/L 107  ?CO2 22 - 32 mmol/L 25  ?Glucose 70 - 99 mg/dL 136 (H)  ?BUN 8 - 23 mg/dL 16  ?Creatinine 0.44 - 1.00 mg/dL 0.80  ?Calcium 8.9 - 10.3 mg/dL 9.2  ?Anion gap 5 - 15  8  ?Alkaline Phosphatase 38 - 126 U/L 64  ?Albumin 3.5 - 5.0 g/dL 4.2  ?AST 15 - 41 U/L 15  ?ALT 0 - 44 U/L 13  ?Total Protein 6.5 - 8.1 g/dL 7.2  ?Total Bilirubin 0.3 - 1.2 mg/dL 0.8  ?GFR, Estimated >60 mL/min >60  ? ? ? ?ASSESSMENT / PLAN / RECOMMENDATIONS:  ? ?1) Type 2 Diabetes Mellitus, Optimally controlled, With neuropathic and Macrovascular  complications - Most recent A1c of 6.8  %. Goal A1c < 7.0 %.   ?  ?-Praised the pt on improved glycemic control  ?- Based on CT scan in 12/2018 showed evidence of left renal nephrolithiasis, she deveoped hematuria with Jardiance  ?- She was skeptical about starting Rybelsus  ?- She is skeptical about Metformin, a few of her siblings has  cirrhosis and metformin issues, we had a long discussion about Metformin , we discussed the risks and benefits, I personally don't see any side effects with it , she agreed to continue at this time  ? ? ?MEDICATIONS: ?Continue  Marin Comment

## 2021-05-02 ENCOUNTER — Encounter: Payer: Self-pay | Admitting: Physical Therapy

## 2021-05-02 ENCOUNTER — Ambulatory Visit: Payer: Medicare HMO | Admitting: Physical Therapy

## 2021-05-02 ENCOUNTER — Other Ambulatory Visit: Payer: Self-pay

## 2021-05-02 DIAGNOSIS — R2681 Unsteadiness on feet: Secondary | ICD-10-CM

## 2021-05-02 DIAGNOSIS — H8110 Benign paroxysmal vertigo, unspecified ear: Secondary | ICD-10-CM | POA: Diagnosis not present

## 2021-05-02 DIAGNOSIS — R42 Dizziness and giddiness: Secondary | ICD-10-CM | POA: Diagnosis not present

## 2021-05-02 DIAGNOSIS — H8111 Benign paroxysmal vertigo, right ear: Secondary | ICD-10-CM

## 2021-05-02 DIAGNOSIS — H8112 Benign paroxysmal vertigo, left ear: Secondary | ICD-10-CM

## 2021-05-02 NOTE — Therapy (Signed)
[place] ?[address] ?[address] ?[place], [place], [address] ?Phone: [phone]   Fax:  [phone] ? ?Physical Therapy Treatment ? ?Patient Details  ?Name: Jacqueline [name] ?MRN: [id number] ?Date of Birth: [birth year] ?Referring Provider (PT): [name] ? ? ?Encounter Date: 05/02/2021 ? ?PHYSICAL THERAPY DISCHARGE SUMMARY ? ?Visits from Start of Care: 3 ? ?Current functional level related to goals / functional outcomes: ?Dizziness resolved; she is still concerned about BLE strength but will need new MD referral for this, we are happy to see her back for any other problems or new conditions  ?  ?Remaining deficits: ?Mild VOR impairment, mild unsteadiness  ?  ?Education / Equipment: ?See below   ? ?Patient agrees to discharge. Patient goals were met. Patient is being discharged due to meeting the stated rehab goals. ? ? ? PT End of Session - 05/02/21 1021   ? ? Visit Number 3   ? Number of Visits 3   ? Authorization Type [employer]   ? Authorization Time Period 04/17/21 to 05/15/21   ? Progress Note Due on Visit 10   ? PT Start Time [phone]   ? PT Stop Time 1012   ? PT Time Calculation (min) 38 min   ? Activity Tolerance Patient tolerated treatment well   ? Behavior During Therapy [facility] for tasks assessed/performed   ? ?  ?  ? ?  ? ? ?Past Medical History:  ?Diagnosis Date  ? Diabetes mellitus   ? HLD (hyperlipidemia)   ? Hypertension   ? Nephrolithiasis   ? Obese   ? TIA (transient ischemic attack) 2020  ? ? ?Past Surgical History:  ?Procedure Laterality Date  ? ROTATOR CUFF REPAIR  2009  ? TUBAL LIGATION  1979  ? ? ?There were no vitals filed for this visit. ? ? Subjective Assessment - 05/02/21 0936   ? ? Subjective I'm really feeling good, dizziness has been 0/10 for some time now. I lost my VOR exercises, can I get another set?   ? Patient Stated Goals Resolve the vertigo   ? Currently in Pain? No/denies   ? ?  ?  ? ?   ? ? ? ? ? ? ? ? ? ? ? ? ? ? ? ? ? ? ? ? ? ? ? ? ? Balance Exercises - 05/02/21 0001   ? ?  ? Balance Exercises: Standing  ? Tandem Stance Eyes open;Foam/compliant surface;3 reps;30 secs   ? SLS Eyes open;Solid surface;3 reps;15 secs   ? Tandem Gait Forward;4 reps;Foam/compliant surface   in // bars  ? Sidestepping Foam/compliant support;4 reps   in // bars  ? Other Standing Exercises VOR exercises horizontal and vertical, VOR cancellation, gaze stabilization with 2 near targets and head rotation   ? ?  ?  ? ?  ? ? ? ? ? PT Education - 05/02/21 1020   ? ? Education Details HEP VOR exercises, DC today, will need new MD referral to work on leg strength and balance but we are happy to see her for this   ? Person(s) Educated Patient   ? Methods Explanation;Handout;Demonstration   ? Comprehension Verbalized understanding;Returned demonstration   ? ?  ?  ? ?  ? ? ? PT Short Term Goals - 05/02/21 1022   ? ?  ? PT SHORT TERM GOAL #1  ? Title Will be independent with appropriate vertigo HEP   ? Time 1   ? Period Weeks   ?  Status Achieved   ?  ? PT SHORT TERM GOAL #2  ? Title Dizziness to be no more than 1/10   ? Time 1   ? Period Weeks   ? Status Achieved   ? ?  ?  ? ?  ? ? ? ? PT Long Term Goals - 05/02/21 1022   ? ?  ? PT LONG TERM GOAL #1  ? Title Vertigo to be completely resolved   ? Time 4   ? Period Weeks   ? Status Achieved   ?  ? PT LONG TERM GOAL #2  ? Title Will be able to ambulate unlimited distances in community without unsteadiness/will have minimal fall risk with mobility   ? Time 4   ? Period Weeks   ? Status Partially Met   ? ?  ?  ? ?  ? ? ? ? ? ? ? ? Plan - 05/02/21 1021   ? ? Clinical Impression Statement Ms. Mccartney arrives really feeling well today, has not had any episodes of dizziness since last week. [place] problems seem completely resolved, she does still seem to have a bit of VOR impairment as noted last session. She did lose her VOR exercises, I re-created a handout with these for her today and we  spent the session practicing all of them. She is doing well enough we can DC for now- thank you for the referral!   ? Personal Factors and Comorbidities Comorbidity 1   ? Comorbidities h/o TIA, DM type 2, migraine   ? Examination-Activity Limitations Locomotion Level;Reach Overhead;Transfers   ? Examination-Participation Restrictions Cleaning;Community Activity;Driving;Interpersonal Relationship;Laundry;Shop;Meal Prep   ? Stability/Clinical Decision Making Stable/Uncomplicated   ? Clinical Decision Making Low   ? Rehab Potential Good   ? PT Frequency Other (comment)   DC today  ? PT Duration Other (comment)   DC today  ? PT Treatment/Interventions ADLs/Self Care Home Management;[place] Repostioning;Balance training;Neuromuscular re-education;Therapeutic activities;Therapeutic exercise;Patient/family education;Gait training;Manual techniques   ? PT Next Visit Plan DC today   ? PT Home Exercise Plan L ear BBQ roll maneuver,[id number]   ? Consulted and Agree with Plan of Care Patient   ? ?  ?  ? ?  ? ? ?Patient will benefit from skilled therapeutic intervention in order to improve the following deficits and impairments:  Dizziness, Decreased balance ? ?Visit Diagnosis: ?BPPV (benign paroxysmal positional vertigo), left ? ?Dizziness and giddiness ? ?Unsteadiness on feet ? ?BPPV (benign paroxysmal positional vertigo), right ? ? ? ? ?Problem List ?Patient Active Problem List  ? Diagnosis Date Noted  ? Vitamin D deficiency 04/03/2021  ? Pain of lower extremity 01/25/2021  ? Skin lesion 12/07/2020  ? Allergic drug reaction 11/07/2020  ? Injury of left great toe 11/07/2020  ? Anxiety and depression 10/09/2020  ? Soft tissue mass 10/02/2020  ? Radiculopathy of thoracic region 10/02/2020  ? Type 2 diabetes mellitus with hyperglycemia, with long-term current use of insulin ([place]) 09/25/2020  ? Type 2 diabetes mellitus with diabetic polyneuropathy, with long-term current use of insulin ([place]) 09/25/2020  ? History of CVA  (cerebrovascular accident) 09/12/2020  ? Hypertension 09/12/2020  ? Hematuria 12/29/2018  ? Migraine without status migrainosus, not intractable 12/29/2018  ? Trigeminal neuralgia 12/29/2018  ? TIA (transient ischemic attack) 07/13/2018  ? Left-sided weakness 07/13/2018  ? HLD (hyperlipidemia) 11/25/2013  ? DM type 2 (diabetes mellitus, type 2) ([place]) 11/25/2013  ? ?Glendell Schlottman U PT, DPT, PN2  ? ?Supplemental Physical Therapist ?[place]  ? ? ? ? ? ?  [place] ?[address] ?[place], [place], [address] ?Phone: [phone]   Fax:  [phone] ? ?Name: Jacqueline [name] ?MRN: [id number] ?Date of Birth: 05-07-1950 ? ? ? ?

## 2021-05-09 ENCOUNTER — Ambulatory Visit: Payer: Medicare HMO | Admitting: Physical Therapy

## 2021-05-16 ENCOUNTER — Ambulatory Visit: Payer: Medicare HMO | Admitting: Physical Therapy

## 2021-05-23 DIAGNOSIS — S6992XA Unspecified injury of left wrist, hand and finger(s), initial encounter: Secondary | ICD-10-CM | POA: Diagnosis not present

## 2021-05-23 DIAGNOSIS — M25532 Pain in left wrist: Secondary | ICD-10-CM | POA: Diagnosis not present

## 2021-05-30 ENCOUNTER — Encounter: Payer: Self-pay | Admitting: Neurology

## 2021-05-30 ENCOUNTER — Ambulatory Visit: Payer: Medicare HMO | Admitting: Neurology

## 2021-05-30 VITALS — BP 137/81 | HR 67 | Ht 65.0 in | Wt 182.0 lb

## 2021-05-30 DIAGNOSIS — G459 Transient cerebral ischemic attack, unspecified: Secondary | ICD-10-CM | POA: Diagnosis not present

## 2021-05-30 DIAGNOSIS — R0683 Snoring: Secondary | ICD-10-CM

## 2021-05-30 DIAGNOSIS — E1159 Type 2 diabetes mellitus with other circulatory complications: Secondary | ICD-10-CM | POA: Diagnosis not present

## 2021-05-30 DIAGNOSIS — R531 Weakness: Secondary | ICD-10-CM

## 2021-05-30 DIAGNOSIS — I6930 Unspecified sequelae of cerebral infarction: Secondary | ICD-10-CM

## 2021-05-30 NOTE — Progress Notes (Signed)
? ? ?SLEEP MEDICINE CLINIC ?  ? ?Provider:  Larey Seat, MD  ?Primary Care Physician:  [name], NP ?[address] ?[place] [address]  ? ?  ?Referring Provider: Garvin Fila, Md ?[address] ?[address] ?[place],  [place] [address]  ?  ?  ?    ?Chief Complaint according to patient   ?Patient presents with:  ?  ? New Patient (Initial Visit)  ?     ?  ?  ?HISTORY OF PRESENT ILLNESS:  ?Jacqueline [name] is a 71 y.o. Caucasian female patient seen here as a referral on 05/30/2021. ? ? ?05-30-2021. Chief concern according to patient : " I had a stroke and it affected my left leg and feeling in my right arm, my arm was limp" I am right handed.    ?  ?HOLLIDAY SHEAFFER  has a past medical history of Diabetes mellitus, Insulin user, dx; at age 73, HLD (hyperlipidemia), Hypertension, Nephrolithiasis, Obesity, BPV, wrist pain, Stroke and TIA (transient ischemic attack) (2020). ?  ?  ?Sleep relevant medical history: CA stenosis.   ? ?Update 04/08/2021 JM: 71 year old female with history of ischemic stroke 09/2020, and TIA vs complicated migraine 08/3426.  Overall stable from stroke standpoint without new stroke/TIA symptoms. She does report fluctuation of LLE weakness. Per husband, walks with a limp. Reports present since stroke in 09/2020. Does plan on restarting swimming and exercising on stationary bike.  Denies any additional migraine headaches but does report sharp shooting sensation on top of head several times per month which has been present since her prior stroke.  Currently on topiramate 50 mg nightly without side effects.  Compliant on Plavix and Crestor without side effects.  Blood pressure today 140/79. No further concerns at this time. ? Family medical /sleep history: ( sisters and 3 brothers of whom 9 are diabetic). son is DM 2, oldest daughter has RA, younger daughter is borderline DM-  other family member on CPAP with OSA, insomnia, sleep walkers.  ?  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 No pets. Family status is married, with 4 children. 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  ETOH use none,  ? 1 cup in AM ,Caffeine intake in form of Coffee, diet coke, 16 ounces a day. Marland Kitchen ?Regular exercise in form of none.   ?Hobbies : garden ? ?  ?  ?Sleep habits are as follows:  ?The patient's dinner time is between 6-7 PM. The patient goes to bed at 10.30-11.30 PM and continues to sleep for 4-5 hours, wakes for one bathroom break, back to sleep until  7.30 AM.   ?The preferred sleep position is right sided, with the support of 1 pillow, flat bed.  ?Dreams are reportedly frequent/vivid. Watch Tv in the den- not in bed. Snoring, but no apnea witnessed.  ?7.30-8  AM is the usual rise time. The patient wakes up spontaneously. She is sometimes woken by headaches, palpitations, no nausea.  ?She reports not feeling refreshed or restored in AM, with symptoms such as dry mouth, but no morning headaches, and residual fatigue. ? Naps are taken infrequently, lasting from 15 to 30 minutes and are refreshing. She may take a nap while watching TV.  ?  ?Review of Systems: ?Out of  a complete 14 system review, the patient complains of only the following symptoms, and all other reviewed systems are negative.:  ?Fatigue, sleepiness , snoring,  ?  ?How likely are you to doze in the following situations: ?0 = not likely, 1 = slight chance, 2 = moderate chance, 3 = high chance ?  ?Sitting and Reading? 3 ?Watching Television? 3 ?Sitting inactive in a public place (theater or meeting)? ?As a passenger in a car for an hour without a break? ?Lying down in the afternoon when circumstances permit? ?Sitting and talking to someone? ?Sitting quietly after lunch without alcohol? ?In a car, while stopped for a few minutes in traffic? ?  ?Total = 9/ 24 points   ? FSS endorsed at 35/ 63 points.  ? ?Social History  ? ?Socioeconomic History  ? Marital status: Married  ?  Spouse name: Jacqueline [name]  ? Number of children: 4  ? Years of education: Not on file  ? Highest education level: High school graduate  ?Occupational History  ? Occupation: Retired-Missionary  ?Tobacco Use  ? Smoking status: Never  ? Smokeless tobacco: Never  ?Vaping Use  ? Vaping Use: Never used  ?Substance and Sexual Activity  ? Alcohol use: No  ? Drug use: No  ? Sexual activity: Yes  ?  Partners: Male  ?  Birth control/protection: Post-menopausal  ?Other Topics Concern  ? Not on file  ?Social History Narrative  ? Retired Media planner  ? Husband is a Theme park manager- does some missions in [place]  ? 2 great grandchildren (live in [place])  ? Married 50 years  ? 4 children- 1 [place], 1  [place], 1 in [place], 1 at home  ? 8 granchildren  ? Enjoys crafts  ? No pets  ?   ? R handed  ? Caffeine: 1 C of coffee in the AM, 16oz of soda a day  ? ?Social Determinants of Health  ? ?Financial Resource Strain: Not on file  ?Food Insecurity: Not on file  ?Transportation Needs: Not on file  ?Physical Activity: Not on file  ?Stress: Not on file  ?Social Connections: Not on file  ? ? ?Family History  ?Problem Relation Age of Onset  ? Diabetes Father   ? Heart attack Father 18  ? Heart disease Mother   ?     hx cabg at 75  ? Diabetes type II Brother   ? Parkinson's disease Sister   ? Brain cancer Sister   ? Pancreatic cancer Brother   ? Diabetes Mellitus II Sister   ? Diabetes Mellitus II Sister   ? CAD Sister   ? Diabetes Mellitus II Sister   ? Diabetes Mellitus II Sister   ? Osteoarthritis Daughter   ? AAA (abdominal aortic aneurysm) Daughter   ? ? ?Past Medical History:  ?Diagnosis Date  ? Diabetes mellitus   ? HLD (hyperlipidemia)   ? Hypertension   ? Nephrolithiasis   ? Obese   ? TIA (transient ischemic attack) 2020  ? ? ?Past Surgical History:  ?Procedure Laterality Date  ? ROTATOR CUFF REPAIR  2009  ? TUBAL  LIGATION  1979  ?  ? ?Current Outpatient Medications on File Prior to Visit  ?Medication Sig Dispense Refill  ? clopidogrel (PLAVIX) 75 MG tablet Take 1 tablet (75 mg total) by mouth daily. 90 tablet 3  ? glucose blood (ACCU-CHEK GUIDE) test strip Use as instructed 100 strip 5  ? insulin detemir (LEVEMIR FLEXPEN) 100 UNIT/ML FlexPen Inject 28 Units into  the skin daily. 30 mL 3  ? Insulin Pen Needle (BD PEN NEEDLE NANO 2ND GEN) 32G X 4 MM MISC Inject 1 Device into the skin daily in the afternoon. USE DAILY AS DIRECTED 100 each 3  ? Lancets (ACCU-CHEK MULTICLIX) lancets Use as instructed to test blood sugars 3 times daily E11.65 100 each 12  ? losartan (COZAAR) 25 MG tablet TAKE 1 TABLET (25 MG TOTAL) BY MOUTH DAILY. 90 tablet 1  ? metFORMIN (GLUCOPHAGE) 1000 MG tablet Take 1 tablet (1,000 mg total) by mouth 2 (two) times daily with a meal. 180 tablet 3  ? rosuvastatin (CRESTOR) 40 MG tablet Take 1 tablet (40 mg total) by mouth at bedtime. 90 tablet 2  ? topiramate (TOPAMAX) 50 MG tablet Take 1 tablet (50 mg total) by mouth 2 (two) times daily. 180 tablet 3  ? Vitamin D, Ergocalciferol, (DRISDOL) 1.25 MG (50000 UNIT) CAPS capsule TAKE 1 CAPSULE EVERY 7 DAYS 12 capsule 0  ? ?No current facility-administered medications on file prior to visit.  ? ? ?Allergies  ?Allergen Reactions  ? Augmentin [Amoxicillin-Pot Clavulanate] Other (See Comments)  ?  Altered mental status ?Did it involve swelling of the face/tongue/throat, SOB, or low BP? Unknown ?Did it involve sudden or severe rash/hives, skin peeling, or any reaction on the inside of your mouth or nose? Unknown ?Did you need to seek medical attention at a hospital or doctor's office? Unknown ?When did it last happen? unk       ?If all above answers are ?NO?, may proceed with cephalosporin use. ?  ? Definity [Perflutren Lipid Microsphere] Other (See Comments)  ?  Headache and facial flushing after Definity IVP given. No SOB or Chest pain,and Vital signs stable. Irven Baltimore, RN.  ? Novocain [Procaine] Other (See Comments)  ?  Altered mental status  ? Promethazine Swelling  ?  Tongue swelling  ? Prozac [Fluoxetine Hcl] Other (See Comments)  ?  Lip swelling x 1 day aft

## 2021-05-30 NOTE — Patient Instructions (Signed)
Screening for Sleep Apnea  Sleep apnea is a condition in which breathing pauses or becomes shallow during sleep. Sleep apnea screening is a test to determine if you are at risk for sleep apnea. The test includes a series of questions. It will only takes a few minutes. Your health care provider may ask you to have this test in preparation for surgery or as part of a physical exam. What are the symptoms of sleep apnea? Common symptoms of sleep apnea include: Snoring. Waking up often at night. Daytime sleepiness. Pauses in breathing. Choking or gasping during sleep. Irritability. Forgetfulness. Trouble thinking clearly. Depression. Personality changes. Most people with sleep apnea do not know that they have it. What are the advantages of sleep apnea screening? Getting screened for sleep apnea can help: Ensure your safety. It is important for your health care providers to know whether or not you have sleep apnea, especially if you are having surgery or have other long-term (chronic) health conditions. Improve your health and allow you to get a better night's rest. Restful sleep can help you: Have more energy. Lose weight. Improve high blood pressure. Improve diabetes management. Prevent stroke. Prevent car accidents. What happens during the screening? Screening usually includes being asked a list of questions about your sleep quality. Some questions you may be asked include: Do you snore? Is your sleep restless? Do you have daytime sleepiness? Has a partner or spouse told you that you stop breathing during sleep? Have you had trouble concentrating or memory loss? What is your age? What is your neck circumference? To measure your neck, keep your back straight and gently wrap the tape measure around your neck. Put the tape measure at the middle of your neck, between your chin and collarbone. What is your sex assigned at birth? Do you have or are you being treated for high blood  pressure? If your screening test is positive, you are at risk for the condition. Further testing may be needed to confirm a diagnosis of sleep apnea. Where to find more information You can find screening tools online or at your health care clinic. For more information about sleep apnea screening and healthy sleep, visit these websites: Centers for Disease Control and Prevention: www.cdc.gov American Sleep Apnea Association: www.sleepapnea.org Contact a health care provider if: You think that you may have sleep apnea. Summary Sleep apnea screening can help determine if you are at risk for sleep apnea. It is important for your health care providers to know whether or not you have sleep apnea, especially if you are having surgery or have other chronic health conditions. You may be asked to take a screening test for sleep apnea in preparation for surgery or as part of a physical exam. This information is not intended to replace advice given to you by your health care provider. Make sure you discuss any questions you have with your health care provider. Document Revised: 01/06/2020 Document Reviewed: 01/06/2020 Elsevier Patient Education  2023 Elsevier Inc.  

## 2021-06-07 ENCOUNTER — Ambulatory Visit (INDEPENDENT_AMBULATORY_CARE_PROVIDER_SITE_OTHER): Payer: Medicare HMO | Admitting: Family

## 2021-06-07 ENCOUNTER — Telehealth: Payer: Self-pay | Admitting: Family

## 2021-06-07 VITALS — BP 135/62 | HR 68 | Temp 98.3°F | Ht 60.0 in | Wt 183.0 lb

## 2021-06-07 DIAGNOSIS — E348 Other specified endocrine disorders: Secondary | ICD-10-CM

## 2021-06-07 DIAGNOSIS — I10 Essential (primary) hypertension: Secondary | ICD-10-CM | POA: Diagnosis not present

## 2021-06-07 DIAGNOSIS — E1159 Type 2 diabetes mellitus with other circulatory complications: Secondary | ICD-10-CM

## 2021-06-07 DIAGNOSIS — Z23 Encounter for immunization: Secondary | ICD-10-CM

## 2021-06-07 DIAGNOSIS — E559 Vitamin D deficiency, unspecified: Secondary | ICD-10-CM | POA: Diagnosis not present

## 2021-06-07 DIAGNOSIS — Z8673 Personal history of transient ischemic attack (TIA), and cerebral infarction without residual deficits: Secondary | ICD-10-CM

## 2021-06-07 DIAGNOSIS — M25551 Pain in right hip: Secondary | ICD-10-CM | POA: Insufficient documentation

## 2021-06-07 DIAGNOSIS — Z1159 Encounter for screening for other viral diseases: Secondary | ICD-10-CM | POA: Diagnosis not present

## 2021-06-07 NOTE — Telephone Encounter (Signed)
Also, please call to request immunization record from [facility]. ?

## 2021-06-07 NOTE — Telephone Encounter (Signed)
Records release faxed to [facility] ?

## 2021-06-07 NOTE — Assessment & Plan Note (Signed)
Continue secondary stroke prevention- continue crestor and plavix.  ?

## 2021-06-07 NOTE — Addendum Note (Signed)
Addended by: Jiles Prows on: 06/07/2021 03:03 PM ? ? Modules accepted: Orders ? ?

## 2021-06-07 NOTE — Patient Instructions (Signed)
Please add tylenol '1000mg'$  at bedtime for hip pain.  ?

## 2021-06-07 NOTE — Progress Notes (Signed)
? ?Subjective:  ? ? ? Patient ID: Jacqueline [name], female    DOB: [birth year], 71 y.o.   MRN: [id number] ? ?Chief Complaint  ?Patient presents with  ? Hypertension  ?  Here for follow up  ? Diabetes  ?  Here for follow up  ? Hip Pain  ?  Complains of right hip pain mostly at night  ? ? ?HPI ?Patient is in today for follow up. ? ?HTN-   ?BP Readings from Last 3 Encounters:  ?06/07/21 135/62  ?05/30/21 137/81  ?04/29/21 124/76  ? ?DM2- She is followed by endocrinology.  Maintained on levemir 28 units daily as well as metformin.  ?Lab Results  ?Component Value Date  ? HGBA1C 6.8 (A) 04/29/2021  ? HGBA1C 6.9 (A) 12/24/2020  ? HGBA1C 7.9 (H) 09/13/2020  ? ?Lab Results  ?Component Value Date  ? MICROALBUR 1.4 04/02/2020  ? [place] 31 10/16/2020  ? CREATININE 0.80 04/13/2021  ? ?Right hip pain- worse at night.  ? ?Vit D deficiency- 8 weeks in out of 12 on vit D 50000 iu once weekly.  ? ?Hx of CVA- on statin, plavix.  ? ?Health Maintenance Due  ?Topic Date Due  ? Hepatitis C Screening  Never done  ? COLONOSCOPY (Pts 45-36yr Insurance coverage will need to be confirmed)  Never done  ? Zoster Vaccines- Shingrix (1 of 2) Never done  ? DEXA SCAN  Never done  ? Pneumonia Vaccine 72 Years old (2 - PPSV23 if available, else PCV20) 07/18/2018  ? FOOT EXAM  05/02/2020  ? OPHTHALMOLOGY EXAM  09/04/2020  ? ? ?Past Medical History:  ?Diagnosis Date  ? Diabetes mellitus   ? HLD (hyperlipidemia)   ? Hypertension   ? Nephrolithiasis   ? Obese   ? TIA (transient ischemic attack) 2020  ? ? ?Past Surgical History:  ?Procedure Laterality Date  ? ROTATOR CUFF REPAIR  2009  ? TUBAL LIGATION  1979  ? ? ?Family History  ?Problem Relation Age of Onset  ? Diabetes Father   ? Heart attack Father 661 ? Heart disease Mother   ?     hx cabg at 845 ? Diabetes type II Brother   ? Parkinson's disease Sister   ? Brain cancer Sister   ? Pancreatic cancer Brother   ? Diabetes Mellitus II Sister   ? Diabetes Mellitus II Sister   ? CAD Sister   ? Diabetes  Mellitus II Sister   ? Diabetes Mellitus II Sister   ? Osteoarthritis Daughter   ? AAA (abdominal aortic aneurysm) Daughter   ? ? ?Social History  ? ?Socioeconomic History  ? Marital status: Married  ?  Spouse name: [name] ? Number of children: 4  ? Years of education: Not on file  ? Highest education level: High school graduate  ?Occupational History  ? Occupation: Retired-Missionary  ?Tobacco Use  ? Smoking status: Never  ? Smokeless tobacco: Never  ?Vaping Use  ? Vaping Use: Never used  ?Substance and Sexual Activity  ? Alcohol use: No  ? Drug use: No  ? Sexual activity: Yes  ?  Partners: Male  ?  Birth control/protection: Post-menopausal  ?Other Topics Concern  ? Not on file  ?Social History Narrative  ? Retired oMedia planner ? Husband is a pTheme park manager does some missions in [place] ? 2 great grandchildren (live in [place]  ? Married 50 years  ? 4 children- 1 [place], 1  [place], 1 in [place]  1 at home  ? 8 granchildren  ? Enjoys crafts  ? No pets  ?   ? R handed  ? Caffeine: 1 C of coffee in the AM, 16oz of soda a day  ? ?Social Determinants of Health  ? ?Financial Resource Strain: Not on file  ?Food Insecurity: Not on file  ?Transportation Needs: Not on file  ?Physical Activity: Not on file  ?Stress: Not on file  ?Social Connections: Not on file  ?Intimate Partner Violence: Not on file  ? ? ?Outpatient Medications Prior to Visit  ?Medication Sig Dispense Refill  ? clopidogrel (PLAVIX) 75 MG tablet Take 1 tablet (75 mg total) by mouth daily. 90 tablet 3  ? glucose blood (ACCU-CHEK GUIDE) test strip Use as instructed 100 strip 5  ? insulin detemir (LEVEMIR FLEXPEN) 100 UNIT/ML FlexPen Inject 28 Units into the skin daily. 30 mL 3  ? Insulin Pen Needle (BD PEN NEEDLE NANO 2ND GEN) 32G X 4 MM MISC Inject 1 Device into the skin daily in the afternoon. USE DAILY AS DIRECTED 100 each 3  ? Lancets (ACCU-CHEK MULTICLIX) lancets Use as instructed to test blood sugars 3 times daily E11.65 100 each 12  ?  losartan (COZAAR) 25 MG tablet TAKE 1 TABLET (25 MG TOTAL) BY MOUTH DAILY. 90 tablet 1  ? metFORMIN (GLUCOPHAGE) 1000 MG tablet Take 1 tablet (1,000 mg total) by mouth 2 (two) times daily with a meal. 180 tablet 3  ? rosuvastatin (CRESTOR) 40 MG tablet Take 1 tablet (40 mg total) by mouth at bedtime. 90 tablet 2  ? topiramate (TOPAMAX) 50 MG tablet Take 1 tablet (50 mg total) by mouth 2 (two) times daily. 180 tablet 3  ? Vitamin D, Ergocalciferol, (DRISDOL) 1.25 MG (50000 UNIT) CAPS capsule TAKE 1 CAPSULE EVERY 7 DAYS 12 capsule 0  ? ?No facility-administered medications prior to visit.  ? ? ?Allergies  ?Allergen Reactions  ? Augmentin [Amoxicillin-Pot Clavulanate] Other (See Comments)  ?  Altered mental status ?Did it involve swelling of the face/tongue/throat, SOB, or low BP? Unknown ?Did it involve sudden or severe rash/hives, skin peeling, or any reaction on the inside of your mouth or nose? Unknown ?Did you need to seek medical attention at a hospital or doctor's office? Unknown ?When did it last happen? unk       ?If all above answers are ?NO?, may proceed with cephalosporin use. ?  ? Definity [Perflutren Lipid Microsphere] Other (See Comments)  ?  Headache and facial flushing after Definity IVP given. No SOB or Chest pain,and Vital signs stable. Irven Baltimore, RN.  ? Novocain [Procaine] Other (See Comments)  ?  Altered mental status  ? Promethazine Swelling  ?  Tongue swelling  ? Prozac [Fluoxetine Hcl] Other (See Comments)  ?  Lip swelling x 1 day after 1st dose  ? ? ?ROS ?See HPI ?   ?Objective:  ?  ?Physical Exam ?Constitutional:   ?   General: She is not in acute distress. ?   Appearance: Normal appearance. She is well-developed.  ?HENT:  ?   Head: Normocephalic and atraumatic.  ?   Right Ear: External ear normal.  ?   Left Ear: External ear normal.  ?Eyes:  ?   General: No scleral icterus. ?Neck:  ?   Thyroid: No thyromegaly.  ?Cardiovascular:  ?   Rate and Rhythm: Normal rate and regular rhythm.  ?    Heart sounds: Normal heart sounds. No murmur heard. ?Pulmonary:  ?  Effort: Pulmonary effort is normal. No respiratory distress.  ?   Breath sounds: Normal breath sounds. No wheezing.  ?Musculoskeletal:  ?   Cervical back: Neck supple.  ?Skin: ?   General: Skin is warm and dry.  ?Neurological:  ?   Mental Status: She is alert and oriented to person, place, and time.  ?Psychiatric:     ?   Mood and Affect: Mood normal.     ?   Behavior: Behavior normal.     ?   Thought Content: Thought content normal.     ?   Judgment: Judgment normal.  ? ? ?BP 135/62 (BP Location: Right Arm, Patient Position: Sitting, Cuff Size: Small)   Pulse 68   Temp 98.3 ?F (36.8 ?C) (Oral)   Ht 5' (1.524 m)   Wt 183 lb (83 kg)   HC 5" (12.7 cm)   SpO2 (!) 16%   PF 98 L/min   BMI 35.74 kg/m?  ?Wt Readings from Last 3 Encounters:  ?06/07/21 183 lb (83 kg)  ?05/30/21 182 lb (82.6 kg)  ?04/29/21 182 lb (82.6 kg)  ? ? ?   ?Assessment & Plan:  ? ?Problem List Items Addressed This Visit   ? ?  ? Unprioritized  ? Vitamin D deficiency  ?  Complete 12 week course of vit D then repeat vit D level.  ? ?  ?  ? Right hip pain  ?  New.  Recommended tylenol '1000mg'$  PO QHS to see if this helps her sleep more comfortably. If not, she will let me know and we will plan a referral to orthopedics.  ? ?  ?  ? Hypertension  ?  BP is at goal. Continue losartan  ? ?  ?  ? History of CVA (cerebrovascular accident)  ?  Continue secondary stroke prevention- continue crestor and plavix.  ? ?  ?  ? DM type 2 (diabetes mellitus, type 2) ([place])  ?  A1C is at goal. Management per Endocrinology.  ? ?  ?  ? ?Other Visit Diagnoses   ? ? Estradiol deficiency    -  Primary  ? Relevant Orders  ? DG Bone Density  ? Need for hepatitis C screening test      ? Relevant Orders  ? Hepatitis C Antibody  ? ?  ? ? ?I am having Jacqueline [name] maintain her accu-chek multiclix, rosuvastatin, clopidogrel, losartan, Accu-Chek Guide, topiramate, Vitamin D (Ergocalciferol), metFORMIN,  Levemir FlexPen, and BD Pen Needle Nano 2nd Gen. ? ?No orders of the defined types were placed in this encounter. ? ?

## 2021-06-07 NOTE — Telephone Encounter (Signed)
Please call [facility] and request a copy of her colonoscopy.  ?

## 2021-06-07 NOTE — Telephone Encounter (Signed)
Please request eye exam from [facility].  ?

## 2021-06-07 NOTE — Assessment & Plan Note (Signed)
Complete 12 week course of vit D then repeat vit D level.  ?

## 2021-06-07 NOTE — Telephone Encounter (Signed)
Records request faxed to [facility]  and immunizations records requested by phone. ?

## 2021-06-07 NOTE — Assessment & Plan Note (Signed)
BP is at goal. Continue losartan  ?

## 2021-06-07 NOTE — Assessment & Plan Note (Signed)
New.  Recommended tylenol '1000mg'$  PO QHS to see if this helps her sleep more comfortably. If not, she will let me know and we will plan a referral to orthopedics.  ?

## 2021-06-07 NOTE — Assessment & Plan Note (Signed)
A1C is at goal. Management per Endocrinology.  ?

## 2021-06-09 LAB — HEPATITIS C ANTIBODY
Hepatitis C Ab: NONREACTIVE
SIGNAL TO CUT-OFF: 0.04 (ref <1.00)

## 2021-06-10 ENCOUNTER — Encounter: Payer: Self-pay | Admitting: Physician Assistant

## 2021-06-11 ENCOUNTER — Telehealth: Payer: Self-pay | Admitting: *Deleted

## 2021-06-11 NOTE — Telephone Encounter (Signed)
Called patient to let her know that she can see her upcoming appointment in her mychart. Confirmed with her that she does have appointment in October with [facility].   ?

## 2021-06-13 ENCOUNTER — Telehealth (HOSPITAL_BASED_OUTPATIENT_CLINIC_OR_DEPARTMENT_OTHER): Payer: Self-pay

## 2021-06-13 ENCOUNTER — Other Ambulatory Visit (HOSPITAL_BASED_OUTPATIENT_CLINIC_OR_DEPARTMENT_OTHER): Payer: Medicare HMO

## 2021-06-14 ENCOUNTER — Telehealth: Payer: Self-pay | Admitting: Family

## 2021-06-14 NOTE — Telephone Encounter (Signed)
noted 

## 2021-06-14 NOTE — Telephone Encounter (Signed)
[facility] stated they received a request for immunizations, but advised she has never been seen there.  ?

## 2021-06-24 ENCOUNTER — Other Ambulatory Visit: Payer: Self-pay | Admitting: Internal Medicine

## 2021-07-04 ENCOUNTER — Other Ambulatory Visit: Payer: Self-pay | Admitting: Family

## 2021-07-22 ENCOUNTER — Telehealth: Payer: Self-pay | Admitting: Neurology

## 2021-07-22 NOTE — Telephone Encounter (Signed)
spoke to the patient she states she will call back  Craig [employer]aggers: [phone] (exp. 07/22/21 to 08/21/21)

## 2021-07-31 NOTE — Telephone Encounter (Signed)
unable to LVM for pt the mailbox is full

## 2021-09-10 DIAGNOSIS — S2249XA Multiple fractures of ribs, unspecified side, initial encounter for closed fracture: Secondary | ICD-10-CM

## 2021-09-10 HISTORY — DX: Multiple fractures of ribs, unspecified side, initial encounter for closed fracture: S22.49XA

## 2021-09-26 ENCOUNTER — Ambulatory Visit (HOSPITAL_BASED_OUTPATIENT_CLINIC_OR_DEPARTMENT_OTHER)
Admission: RE | Admit: 2021-09-26 | Discharge: 2021-09-26 | Disposition: A | Discharge status: Home / Self Care | Payer: Medicare HMO | Source: Ambulatory Visit | Attending: Family Medicine | Admitting: Family Medicine

## 2021-09-26 ENCOUNTER — Encounter: Payer: Self-pay | Admitting: Family Medicine

## 2021-09-26 ENCOUNTER — Ambulatory Visit (INDEPENDENT_AMBULATORY_CARE_PROVIDER_SITE_OTHER): Payer: Medicare HMO | Admitting: Family Medicine

## 2021-09-26 VITALS — BP 145/50 | HR 60 | Ht 60.0 in | Wt 192.6 lb

## 2021-09-26 DIAGNOSIS — R0781 Pleurodynia: Secondary | ICD-10-CM | POA: Insufficient documentation

## 2021-09-26 DIAGNOSIS — S299XXA Unspecified injury of thorax, initial encounter: Secondary | ICD-10-CM | POA: Diagnosis not present

## 2021-09-26 LAB — POC URINALSYSI DIPSTICK (AUTOMATED)
Bilirubin, UA: NEGATIVE
Blood, UA: NEGATIVE
Glucose, UA: NEGATIVE
Ketones, UA: NEGATIVE
Leukocytes, UA: NEGATIVE
Nitrite, UA: NEGATIVE
Protein, UA: NEGATIVE
Spec Grav, UA: 1.025 (ref 1.010–1.025)
Urobilinogen, UA: 0.2 E.U./dL
pH, UA: 5 (ref 5.0–8.0)
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
## 2021-09-26 NOTE — Patient Instructions (Addendum)
Urine sample was negative for any signs of infections Sounds musculoskeletal given your description and presentation. Right ribs xray ordered. We will let you know results.  Continue supportive measures - rest, ice, heat, bracing for coughing/sneezing, topical creams for pain relief, tylenol, lidocaine patches, etc.   Please contact office for follow-up if symptoms do not improve or worsen. Seek emergency care if symptoms become severe.

## 2021-09-30 ENCOUNTER — Other Ambulatory Visit: Payer: Self-pay | Admitting: *Deleted

## 2021-10-01 ENCOUNTER — Encounter: Payer: Self-pay | Admitting: Family

## 2021-10-01 ENCOUNTER — Ambulatory Visit (INDEPENDENT_AMBULATORY_CARE_PROVIDER_SITE_OTHER): Payer: Medicare HMO | Admitting: Family

## 2021-10-01 ENCOUNTER — Telehealth: Payer: Self-pay | Admitting: Family

## 2021-10-01 VITALS — BP 128/61 | HR 67 | Temp 97.9°F | Resp 16 | Wt 189.0 lb

## 2021-10-01 DIAGNOSIS — R319 Hematuria, unspecified: Secondary | ICD-10-CM

## 2021-10-01 DIAGNOSIS — S2241XD Multiple fractures of ribs, right side, subsequent encounter for fracture with routine healing: Secondary | ICD-10-CM

## 2021-10-01 DIAGNOSIS — Z794 Long term (current) use of insulin: Secondary | ICD-10-CM | POA: Diagnosis not present

## 2021-10-01 DIAGNOSIS — Z8673 Personal history of transient ischemic attack (TIA), and cerebral infarction without residual deficits: Secondary | ICD-10-CM

## 2021-10-01 DIAGNOSIS — I10 Essential (primary) hypertension: Secondary | ICD-10-CM

## 2021-10-01 DIAGNOSIS — L989 Disorder of the skin and subcutaneous tissue, unspecified: Secondary | ICD-10-CM | POA: Diagnosis not present

## 2021-10-01 DIAGNOSIS — S2241XA Multiple fractures of ribs, right side, initial encounter for closed fracture: Secondary | ICD-10-CM | POA: Insufficient documentation

## 2021-10-01 DIAGNOSIS — E1165 Type 2 diabetes mellitus with hyperglycemia: Secondary | ICD-10-CM

## 2021-10-01 DIAGNOSIS — E785 Hyperlipidemia, unspecified: Secondary | ICD-10-CM

## 2021-10-01 DIAGNOSIS — E559 Vitamin D deficiency, unspecified: Secondary | ICD-10-CM | POA: Diagnosis not present

## 2021-10-01 HISTORY — DX: Multiple fractures of ribs, right side, initial encounter for closed fracture: S22.41XA

## 2021-10-01 LAB — LIPID PANEL
Cholesterol: 162 mg/dL (ref 0–200)
HDL: 40.4 mg/dL (ref >39.00)
LDL Cholesterol: 82 mg/dL (ref 0–99)
NonHDL: 121.22
Total CHOL/HDL Ratio: 4
Triglycerides: 195 mg/dL — ABNORMAL HIGH (ref 0.0–149.0)
VLDL: 39 mg/dL (ref 0.0–40.0)

## 2021-10-01 LAB — VITAMIN D 25 HYDROXY (VIT D DEFICIENCY, FRACTURES): VITD: 22.01 ng/mL — ABNORMAL LOW (ref 30.00–100.00)

## 2021-10-01 NOTE — Assessment & Plan Note (Signed)
BP Readings from Last 3 Encounters:  10/01/21 128/61  09/26/21 (!) 145/50  06/07/21 135/62   BP at goal. Continue current dose of Losartan.

## 2021-10-01 NOTE — Assessment & Plan Note (Signed)
Lab Results  Component Value Date   HGBA1C 6.8 (A) 04/29/2021   Last A1C at goal. Management per endocrinology.

## 2021-10-01 NOTE — Telephone Encounter (Signed)
Also, please request DM eye exam from [facility] eye.

## 2021-10-01 NOTE — Assessment & Plan Note (Signed)
Clinically stable. She is managing pain with tylenol and salon pas patches.  She is advised to schedule her bone density test on the first floor.

## 2021-10-01 NOTE — Telephone Encounter (Signed)
Record release form faxed to both

## 2021-10-01 NOTE — Assessment & Plan Note (Signed)
Last LDL was at goal. Continue crestor. Obtain follow up lipid panel.

## 2021-10-01 NOTE — Assessment & Plan Note (Signed)
Recheck level post treatment with weekly vit D 50000 iu.

## 2021-10-01 NOTE — Telephone Encounter (Signed)
Please call [facility] in [place] to request Colonoscopy report.  She says Dr. Rise Paganini did the colo but I don't think that provider is still there.

## 2021-10-01 NOTE — Assessment & Plan Note (Signed)
She reports that the practice she was to establish with is closing. She is requesting referral to a different practice.
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
## 2021-10-01 NOTE — Assessment & Plan Note (Signed)
UA on 8/17 negative for blood.

## 2021-10-01 NOTE — Assessment & Plan Note (Signed)
Continue secondary stroke prevention with plavix and statin.

## 2021-10-02 ENCOUNTER — Telehealth: Payer: Self-pay | Admitting: Family

## 2021-10-02 MED ORDER — VITAMIN D (ERGOCALCIFEROL) 1.25 MG (50000 UNIT) PO CAPS: 50000.0000 [IU] | ORAL_CAPSULE | ORAL | 0 refills | Status: DC

## 2021-10-02 NOTE — Telephone Encounter (Signed)
Vitamin D level is low.  Advise patient to begin vit D 50000 units once weekly for 12 weeks, then repeat vit D level (dx Vit D deficiency).     

## 2021-10-02 NOTE — Telephone Encounter (Signed)
Called but no answer at home or cell. Lvm for patient to call back for lab results and provider's advise.

## 2021-10-02 NOTE — Telephone Encounter (Signed)
Your triglycerides are mildly elevated.  Please work on avoiding concentrated sweets, and limiting white carbs (rice/bread/pasta/potatoes).  Instead substitute whole grain versions with reasonable portions.

## 2021-10-03 ENCOUNTER — Encounter: Payer: Self-pay | Admitting: *Deleted
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
## 2021-10-07 ENCOUNTER — Encounter: Payer: Self-pay | Admitting: Adult Health

## 2021-10-07 ENCOUNTER — Ambulatory Visit: Payer: Medicare HMO | Admitting: Adult Health

## 2021-10-07 ENCOUNTER — Telehealth: Payer: Self-pay | Admitting: Adult Health

## 2021-10-07 VITALS — BP 121/68 | HR 66 | Ht 65.0 in | Wt 190.0 lb

## 2021-10-07 DIAGNOSIS — G43109 Migraine with aura, not intractable, without status migrainosus: Secondary | ICD-10-CM | POA: Diagnosis not present

## 2021-10-07 DIAGNOSIS — R531 Weakness: Secondary | ICD-10-CM

## 2021-10-07 DIAGNOSIS — G459 Transient cerebral ischemic attack, unspecified: Secondary | ICD-10-CM

## 2021-10-07 DIAGNOSIS — I6523 Occlusion and stenosis of bilateral carotid arteries: Secondary | ICD-10-CM | POA: Diagnosis not present

## 2021-10-07 DIAGNOSIS — I639 Cerebral infarction, unspecified: Secondary | ICD-10-CM

## 2021-10-07 DIAGNOSIS — I69998 Other sequelae following unspecified cerebrovascular disease: Secondary | ICD-10-CM | POA: Diagnosis not present

## 2021-10-07 NOTE — Patient Instructions (Addendum)
Referral placed to [facility] at [facility] for continued left leg weakness   You will be called to schedule sleep study to rule out possible underlying sleep apnea  Continue clopidogrel 75 mg daily  and Crestor  for secondary stroke prevention  Continue topamax '50mg'$  nightly for headache prevention  Continue to follow up with PCP/endocrinology regarding cholesterol, blood pressure and diabetes management  Maintain strict control of hypertension with blood pressure goal below 130/90, diabetes with hemoglobin A1c goal below 7.0 % and cholesterol with LDL cholesterol (bad cholesterol) goal below 70 mg/dL.   Signs of a Stroke? Follow the BEFAST method:  Balance Watch for a sudden loss of balance, trouble with coordination or vertigo Eyes Is there a sudden loss of vision in one or both eyes? Or double vision?  Face: Ask the person to smile. Does one side of the face droop or is it numb?  Arms: Ask the person to raise both arms. Does one arm drift downward? Is there weakness or numbness of a leg? Speech: Ask the person to repeat a simple phrase. Does the speech sound slurred/strange? Is the person confused ? Time: If you observe any of these signs, call 911.     Followup in the future with me in 6 months or call earlier if needed       Thank you for coming to see [place] at [facility]. I hope we have been able to provide you high quality care today.  You may receive a patient satisfaction survey over the next few weeks. We would appreciate your feedback and comments so that we may continue to improve ourselves and the health of our patients.

## 2021-10-07 NOTE — Telephone Encounter (Signed)
Sent to [facility] ph # [phone]

## 2021-10-08 ENCOUNTER — Ambulatory Visit (HOSPITAL_BASED_OUTPATIENT_CLINIC_OR_DEPARTMENT_OTHER)
Admission: RE | Admit: 2021-10-08 | Discharge: 2021-10-08 | Disposition: A | Discharge status: Home / Self Care | Payer: Medicare HMO | Source: Ambulatory Visit | Attending: Family | Admitting: Family

## 2021-10-08 DIAGNOSIS — E348 Other specified endocrine disorders: Secondary | ICD-10-CM | POA: Insufficient documentation

## 2021-10-08 DIAGNOSIS — Z780 Asymptomatic menopausal state: Secondary | ICD-10-CM | POA: Insufficient documentation

## 2021-10-08 DIAGNOSIS — M8589 Other specified disorders of bone density and structure, multiple sites: Secondary | ICD-10-CM | POA: Diagnosis not present

## 2021-10-08 DIAGNOSIS — M85852 Other specified disorders of bone density and structure, left thigh: Secondary | ICD-10-CM | POA: Diagnosis not present

## 2021-10-10 ENCOUNTER — Telehealth: Payer: Self-pay | Admitting: Family

## 2021-10-10 DIAGNOSIS — M810 Age-related osteoporosis without current pathological fracture: Secondary | ICD-10-CM

## 2021-10-10 NOTE — Telephone Encounter (Signed)
Bone density shows osteoporosis.  I would recommend that she add fosamax '70mg'$  once weekly in the AM- remain upright for at least 90 minutes after taking.   Rx pended- please send to pharmacy of her choice.

## 2021-10-15 NOTE — Telephone Encounter (Signed)
Patient advised of results and she prefers to start Prolia injections every 6 months.

## 2021-10-15 NOTE — Telephone Encounter (Signed)
Brandy, please initiate prolia injection approval.

## 2021-10-16 DIAGNOSIS — R531 Weakness: Secondary | ICD-10-CM | POA: Diagnosis not present

## 2021-10-16 DIAGNOSIS — I69998 Other sequelae following unspecified cerebrovascular disease: Secondary | ICD-10-CM | POA: Diagnosis not present

## 2021-10-19 ENCOUNTER — Telehealth: Payer: Self-pay

## 2021-10-19 NOTE — Telephone Encounter (Signed)
Prolia VOB initiated via [email or web address] ? ?New start ? ?

## 2021-10-19 NOTE — Telephone Encounter (Signed)
[name], NP      10/15/21  9:24 AM Note Jacqueline [name], please initiate prolia injection approval.

## 2021-10-21 ENCOUNTER — Other Ambulatory Visit: Payer: Self-pay

## 2021-10-21 DIAGNOSIS — E1142 Type 2 diabetes mellitus with diabetic polyneuropathy: Secondary | ICD-10-CM

## 2021-10-21 MED ORDER — METFORMIN HCL 1000 MG PO TABS: 1000.0000 mg | ORAL_TABLET | Freq: Two times a day (BID) | ORAL | 0 refills | Status: DC

## 2021-10-22 DIAGNOSIS — R531 Weakness: Secondary | ICD-10-CM | POA: Diagnosis not present

## 2021-10-22 DIAGNOSIS — I69998 Other sequelae following unspecified cerebrovascular disease: Secondary | ICD-10-CM | POA: Diagnosis not present

## 2021-10-24 DIAGNOSIS — I69998 Other sequelae following unspecified cerebrovascular disease: Secondary | ICD-10-CM | POA: Diagnosis not present

## 2021-10-24 DIAGNOSIS — R531 Weakness: Secondary | ICD-10-CM | POA: Diagnosis not present

## 2021-10-28 ENCOUNTER — Ambulatory Visit: Payer: Medicare HMO | Admitting: Internal Medicine

## 2021-10-28 ENCOUNTER — Encounter: Payer: Self-pay | Admitting: Internal Medicine

## 2021-10-28 VITALS — BP 140/84 | HR 68 | Ht 65.0 in | Wt 189.6 lb

## 2021-10-28 DIAGNOSIS — Z794 Long term (current) use of insulin: Secondary | ICD-10-CM | POA: Diagnosis not present

## 2021-10-28 DIAGNOSIS — E1142 Type 2 diabetes mellitus with diabetic polyneuropathy: Secondary | ICD-10-CM

## 2021-10-28 LAB — POCT GLUCOSE (DEVICE FOR HOME USE): Glucose Fasting, POC: 145 mg/dL — AB (ref 70–99)

## 2021-10-28 LAB — POCT GLYCOSYLATED HEMOGLOBIN (HGB A1C): Hemoglobin A1C: 7.4 % — AB (ref 4.0–5.6)

## 2021-10-28 MED ORDER — LEVEMIR FLEXPEN 100 UNIT/ML ~~LOC~~ SOPN: 20.0000 [IU] | PEN_INJECTOR | Freq: Every day | SUBCUTANEOUS | 3 refills | Status: DC

## 2021-10-28 MED ORDER — METFORMIN HCL 1000 MG PO TABS: 1000.0000 mg | ORAL_TABLET | Freq: Two times a day (BID) | ORAL | 3 refills | Status: DC

## 2021-10-28 MED ORDER — RYBELSUS 7 MG PO TABS: 7.0000 mg | ORAL_TABLET | Freq: Every day | ORAL | 2 refills | Status: DC
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
## 2021-10-28 NOTE — Patient Instructions (Addendum)
-   Start Rybelsus 3 mg, 1 tablet before breakfast , after a month increase to 7 mg daily  - Continue Metformin 1000 mg , 1 tablet Twice a day  - Continue  Levemir 20 units daily    HOW TO TREAT LOW BLOOD SUGARS (Blood sugar LESS THAN 70 MG/DL) Please follow the RULE OF 15 for the treatment of hypoglycemia treatment (when your (blood sugars are less than 70 mg/dL)   STEP 1: Take 15 grams of carbohydrates when your blood sugar is low, which includes:  3-4 GLUCOSE TABS  OR 3-4 OZ OF JUICE OR REGULAR SODA OR ONE TUBE OF GLUCOSE GEL    STEP 2: RECHECK blood sugar in 15 MINUTES STEP 3: If your blood sugar is still low at the 15 minute recheck --> then, go back to STEP 1 and treat AGAIN with another 15 grams of carbohydrates.

## 2021-10-30 NOTE — Telephone Encounter (Signed)
Prior auth required for PROLIA ? ?PA PROCESS DETAILS: PA is required. PA can be initiated by calling [phone] or online at ?[email or web address] ?

## 2021-11-01 DIAGNOSIS — I69998 Other sequelae following unspecified cerebrovascular disease: Secondary | ICD-10-CM | POA: Diagnosis not present

## 2021-11-01 DIAGNOSIS — R531 Weakness: Secondary | ICD-10-CM | POA: Diagnosis not present

## 2021-11-18 ENCOUNTER — Other Ambulatory Visit: Payer: Self-pay | Admitting: Family

## 2021-11-20 ENCOUNTER — Other Ambulatory Visit: Payer: Self-pay

## 2021-11-20 MED ORDER — CLOPIDOGREL BISULFATE 75 MG PO TABS: 75.0000 mg | ORAL_TABLET | Freq: Every day | ORAL | 1 refills | Status: DC

## 2021-11-21 ENCOUNTER — Ambulatory Visit: Payer: Medicare HMO | Admitting: Physician Assistant

## 2021-11-26 DIAGNOSIS — I69998 Other sequelae following unspecified cerebrovascular disease: Secondary | ICD-10-CM | POA: Diagnosis not present

## 2021-11-26 DIAGNOSIS — R531 Weakness: Secondary | ICD-10-CM | POA: Diagnosis not present

## 2021-11-28 DIAGNOSIS — H40013 Open angle with borderline findings, low risk, bilateral: Secondary | ICD-10-CM | POA: Diagnosis not present

## 2021-11-28 DIAGNOSIS — H35363 Drusen (degenerative) of macula, bilateral: Secondary | ICD-10-CM | POA: Diagnosis not present

## 2021-11-28 DIAGNOSIS — E119 Type 2 diabetes mellitus without complications: Secondary | ICD-10-CM | POA: Diagnosis not present

## 2021-11-28 DIAGNOSIS — Z961 Presence of intraocular lens: Secondary | ICD-10-CM | POA: Diagnosis not present

## 2021-11-28 LAB — HM DIABETES EYE EXAM

## 2021-12-02 ENCOUNTER — Encounter: Payer: Self-pay | Admitting: *Deleted

## 2021-12-03 NOTE — Telephone Encounter (Signed)
Prior Authorization initiated for PROLIA via [email or web address] Key: [id number] - PA Case ID: [id number]

## 2021-12-05 NOTE — Telephone Encounter (Signed)
Pt ready for scheduling on or after 12/03/21  Out-of-pocket cost due at time of visit: $301  Primary: [employer] Medicare Adv Gold Plus Prolia co-insurance: 20% (approximately $276) Admin fee co-insurance: 20% (approximately $25)  Secondary: n/a Prolia co-insurance:  Admin fee co-insurance:   Deductible: does not apply  Prior Auth: APPROVED Key: [id number] - PA Case ID: [id number] Valid 12/03/21-02/10/23  ** This summary of benefits is an estimation of the patient's out-of-pocket cost. Exact cost may vary based on individual plan coverage.

## 2021-12-05 NOTE — Telephone Encounter (Signed)
Prior auth for [employer] approved Key: [id number] - PA Case ID: [id number] Valid 12/03/21-02/10/23

## 2021-12-05 NOTE — Telephone Encounter (Signed)
Mychart message sent.

## 2021-12-09 ENCOUNTER — Ambulatory Visit: Payer: Medicare HMO | Admitting: Family

## 2021-12-16 ENCOUNTER — Encounter: Payer: Self-pay | Admitting: Family

## 2021-12-16 NOTE — Telephone Encounter (Signed)
Called patient to schedule appointment. She will call back when she reviews her husband's schedule. She was advised she has a 301.00 dollars co-pay  for the shot

## 2021-12-17 ENCOUNTER — Encounter: Payer: Self-pay | Admitting: Family

## 2021-12-19 ENCOUNTER — Ambulatory Visit: Payer: Medicare HMO

## 2021-12-24 DIAGNOSIS — R531 Weakness: Secondary | ICD-10-CM | POA: Diagnosis not present

## 2021-12-24 DIAGNOSIS — I69998 Other sequelae following unspecified cerebrovascular disease: Secondary | ICD-10-CM | POA: Diagnosis not present

## 2022-01-06 ENCOUNTER — Other Ambulatory Visit (HOSPITAL_COMMUNITY): Payer: Self-pay

## 2022-01-06 NOTE — Telephone Encounter (Signed)
Pharmacy Patient Advocate Encounter  Insurance verification completed.    The patient is insured through [employer] test claims for: Prolia '60mg'$ .  Pharmacy benefit copay: $157.68

## 2022-01-06 NOTE — Telephone Encounter (Signed)
Missed appointment letter mailed to pt- with new price enclosed.

## 2022-01-09 ENCOUNTER — Encounter: Payer: Self-pay | Admitting: Internal Medicine

## 2022-01-10 ENCOUNTER — Other Ambulatory Visit: Payer: Self-pay | Admitting: Internal Medicine

## 2022-01-10 MED ORDER — LANTUS SOLOSTAR 100 UNIT/ML ~~LOC~~ SOPN: 20.0000 [IU] | PEN_INJECTOR | Freq: Every day | SUBCUTANEOUS | 6 refills | Status: DC

## 2022-01-14 ENCOUNTER — Other Ambulatory Visit: Payer: Self-pay | Admitting: Internal Medicine

## 2022-01-14 DIAGNOSIS — I69998 Other sequelae following unspecified cerebrovascular disease: Secondary | ICD-10-CM | POA: Diagnosis not present

## 2022-01-14 DIAGNOSIS — R531 Weakness: Secondary | ICD-10-CM | POA: Diagnosis not present

## 2022-01-14 DIAGNOSIS — E1142 Type 2 diabetes mellitus with diabetic polyneuropathy: Secondary | ICD-10-CM

## 2022-01-14 MED ORDER — INSULIN GLARGINE 100 UNIT/ML ~~LOC~~ SOLN: SUBCUTANEOUS | 6 refills | Status: DC

## 2022-01-16 DIAGNOSIS — R531 Weakness: Secondary | ICD-10-CM | POA: Diagnosis not present

## 2022-01-16 DIAGNOSIS — I69998 Other sequelae following unspecified cerebrovascular disease: Secondary | ICD-10-CM | POA: Diagnosis not present

## 2022-01-22 ENCOUNTER — Ambulatory Visit (INDEPENDENT_AMBULATORY_CARE_PROVIDER_SITE_OTHER): Payer: Medicare HMO

## 2022-01-22 DIAGNOSIS — M810 Age-related osteoporosis without current pathological fracture: Secondary | ICD-10-CM | POA: Diagnosis not present

## 2022-01-22 DIAGNOSIS — I69998 Other sequelae following unspecified cerebrovascular disease: Secondary | ICD-10-CM | POA: Diagnosis not present

## 2022-01-22 DIAGNOSIS — R531 Weakness: Secondary | ICD-10-CM | POA: Diagnosis not present

## 2022-01-22 MED ORDER — DENOSUMAB 60 MG/ML ~~LOC~~ SOSY
60.0000 mg | PREFILLED_SYRINGE | Freq: Once | SUBCUTANEOUS | Status: AC
  Administered 2022-01-22: 60 mg via SUBCUTANEOUS

## 2022-01-22 MED ORDER — ROSUVASTATIN CALCIUM 40 MG PO TABS: 40.0000 mg | ORAL_TABLET | Freq: Every day | ORAL | 0 refills | Status: DC

## 2022-01-22 NOTE — Progress Notes (Signed)
Pt here for first Prolia shot. Shot given in left subcutaneus. Pt handled well. Pt given adverse and side effect sheet. Pt also requested refill of medication,  Crestor '40MG'$ .

## 2022-01-25 ENCOUNTER — Other Ambulatory Visit: Payer: Self-pay | Admitting: Adult Health

## 2022-01-31 ENCOUNTER — Ambulatory Visit
Admission: EM | Admit: 2022-01-31 | Discharge: 2022-01-31 | Disposition: A | Discharge status: Home / Self Care | Payer: Medicare HMO | Attending: Urgent Care | Admitting: Urgent Care

## 2022-01-31 DIAGNOSIS — H65191 Other acute nonsuppurative otitis media, right ear: Secondary | ICD-10-CM

## 2022-01-31 DIAGNOSIS — H6121 Impacted cerumen, right ear: Secondary | ICD-10-CM

## 2022-01-31 MED ORDER — CETIRIZINE HCL 10 MG PO TABS: 10.0000 mg | ORAL_TABLET | Freq: Every day | ORAL | 0 refills | Status: DC

## 2022-01-31 MED ORDER — CEFDINIR 300 MG PO CAPS: 300.0000 mg | ORAL_CAPSULE | Freq: Two times a day (BID) | ORAL | 0 refills | Status: DC

## 2022-01-31 NOTE — ED Triage Notes (Signed)
Pt presents with right ear pain since last night. Denies other symptoms.
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
## 2022-01-31 NOTE — Discharge Instructions (Signed)
Please start cefdinir to finish your treatment for a right ear infection. We cleared out all the ear wax. Avoid use of Q-tips. Use over-the-counter Debrox if necessary for ear wax build up.

## 2022-02-01 ENCOUNTER — Emergency Department (HOSPITAL_BASED_OUTPATIENT_CLINIC_OR_DEPARTMENT_OTHER)
Admission: EM | Admit: 2022-02-01 | Discharge: 2022-02-01 | Disposition: A | Discharge status: Home / Self Care | Payer: Medicare HMO | Attending: Emergency Medicine | Admitting: Emergency Medicine

## 2022-02-01 ENCOUNTER — Other Ambulatory Visit: Payer: Self-pay

## 2022-02-01 ENCOUNTER — Encounter (HOSPITAL_BASED_OUTPATIENT_CLINIC_OR_DEPARTMENT_OTHER): Payer: Self-pay | Admitting: Emergency Medicine

## 2022-02-01 DIAGNOSIS — H6691 Otitis media, unspecified, right ear: Secondary | ICD-10-CM | POA: Diagnosis not present

## 2022-02-01 DIAGNOSIS — H6690 Otitis media, unspecified, unspecified ear: Secondary | ICD-10-CM

## 2022-02-01 MED ORDER — HYDROCODONE-ACETAMINOPHEN 5-325 MG PO TABS: 1.0000 | ORAL_TABLET | Freq: Four times a day (QID) | ORAL | 0 refills | Status: DC | PRN

## 2022-02-01 MED ORDER — HYDROCODONE-ACETAMINOPHEN 5-325 MG PO TABS
1.0000 | ORAL_TABLET | Freq: Once | ORAL | Status: AC
  Administered 2022-02-01: 1 via ORAL
  Filled 2022-02-01: qty 1

## 2022-02-01 NOTE — ED Triage Notes (Signed)
Patient c/o right ear pain, states ear is throbbing and causing her head to feel tender to touch.

## 2022-02-01 NOTE — ED Provider Notes (Signed)
[facility] EMERGENCY DEPARTMENT Provider Note   CSN: [id number] Arrival date & time: 02/01/22  1644     History  Chief Complaint  Patient presents with   Otalgia    Jacqueline [name] is a 71 y.o. female.  Patient presents with a chief complaint of right-sided ear pain.  Patient states that for the past 2 days she has had right-sided ear pain.  She was evaluated at urgent care yesterday where she was found to have impacted cerumen which was cleaned by lavage and was diagnosed with nonsuppurative acute otitis media.  She was prescribed cefdinir and cetirizine.  She has taken 3 doses of the cefdinir so far.  She states she has had no improvement in symptoms as of this time.  She is here today for continued pain.  Past medical history significant for type 2 diabetes (reported A1c of 6.9), on insulin, history of CVA, hypertension  HPI     Home Medications Prior to Admission medications   Medication Sig Start Date End Date Taking? Authorizing Provider  HYDROcodone-acetaminophen (NORCO/VICODIN) 5-325 MG tablet Take 1 tablet by mouth every 6 (six) hours as needed. 02/01/22  Yes Dorothyann Peng, PA-C  cefdinir (OMNICEF) 300 MG capsule Take 1 capsule (300 mg total) by mouth 2 (two) times daily. 01/31/22   Jaynee Eagles, PA-C  cetirizine (ZYRTEC ALLERGY) 10 MG tablet Take 1 tablet (10 mg total) by mouth daily. 01/31/22   Jaynee Eagles, PA-C  clopidogrel (PLAVIX) 75 MG tablet Take 1 tablet (75 mg total) by mouth daily. 11/20/21   [name], NP  glucose blood (ACCU-CHEK GUIDE) test strip TEST AS INSTRUCTED 06/24/21   Shamleffer, Melanie Crazier, MD  insulin detemir (LEVEMIR FLEXPEN) 100 UNIT/ML FlexPen Inject 20 Units into the skin daily. 10/28/21   Shamleffer, Melanie Crazier, MD  insulin glargine (LANTUS) 100 UNIT/ML injection INJECT 20 UNITS INTO THE SKIN DAILY 01/14/22   Shamleffer, Melanie Crazier, MD  Insulin Pen Needle (BD PEN NEEDLE NANO 2ND GEN) 32G X 4 MM MISC Inject 1  Device into the skin daily in the afternoon. USE DAILY AS DIRECTED 04/29/21   Shamleffer, Melanie Crazier, MD  Lancets (ACCU-CHEK MULTICLIX) lancets Use as instructed to test blood sugars 3 times daily E11.65 10/04/18   Shamleffer, Melanie Crazier, MD  losartan (COZAAR) 25 MG tablet TAKE 1 TABLET EVERY DAY 07/05/21   [name], NP  metFORMIN (GLUCOPHAGE) 1000 MG tablet TAKE 1 TABLET (1,000 MG TOTAL) BY MOUTH 2 (TWO) TIMES DAILY WITH A MEAL. 01/14/22   Shamleffer, Melanie Crazier, MD  rosuvastatin (CRESTOR) 40 MG tablet Take 1 tablet (40 mg total) by mouth at bedtime. 01/22/22   [name], NP  Semaglutide (RYBELSUS) 7 MG TABS Take 7 mg by mouth daily. 10/28/21   Shamleffer, Melanie Crazier, MD  topiramate (TOPAMAX) 50 MG tablet Take 1 tablet (50 mg total) by mouth 2 (two) times daily. 04/08/21   Frann Rider, NP  Vitamin D, Ergocalciferol, (DRISDOL) 1.25 MG (50000 UNIT) CAPS capsule Take 1 capsule (50,000 Units total) by mouth every 7 (seven) days. 10/02/21   [name], NP      Allergies    Augmentin [amoxicillin-pot clavulanate], Definity [perflutren lipid microsphere], Novocain [procaine], Promethazine, and Prozac [fluoxetine hcl]    Review of Systems   Review of Systems  HENT:  Positive for ear pain. Negative for dental problem and facial swelling.     Physical Exam Updated Vital Signs BP (!) 149/85 (BP Location: Right Arm)   Pulse 72  Temp 97.7 F (36.5 C) (Oral)   Resp 20   Ht '5\' 5"'$  (1.651 m)   Wt 81.6 kg   SpO2 99%   BMI 29.95 kg/m  Physical Exam Vitals and nursing note reviewed.  Constitutional:      General: She is not in acute distress.    Appearance: She is well-developed.  HENT:     Head: Normocephalic and atraumatic.     Right Ear: Tympanic membrane is erythematous.     Left Ear: Tympanic membrane normal.  Eyes:     Conjunctiva/sclera: Conjunctivae normal.  Cardiovascular:     Rate and Rhythm: Normal rate and regular rhythm.     Heart  sounds: No murmur heard. Pulmonary:     Effort: Pulmonary effort is normal. No respiratory distress.     Breath sounds: Normal breath sounds.  Abdominal:     Palpations: Abdomen is soft.     Tenderness: There is no abdominal tenderness.  Musculoskeletal:        General: No swelling.     Cervical back: Neck supple.  Skin:    General: Skin is warm and dry.     Capillary Refill: Capillary refill takes less than 2 seconds.  Neurological:     Mental Status: She is alert.  Psychiatric:        Mood and Affect: Mood normal.     ED Results / Procedures / Treatments   Labs (all labs ordered are listed, but only abnormal results are displayed) Labs Reviewed - No data to display  EKG None  Radiology No results found.  Procedures Procedures    Medications Ordered in ED Medications  HYDROcodone-acetaminophen (NORCO/VICODIN) 5-325 MG per tablet 1 tablet (1 tablet Oral Given 02/01/22 1802)    ED Course/ Medical Decision Making/ A&P                           Medical Decision Making Risk Prescription drug management.   Patient presents with chief complaint of right-sided ear pain.  Differential diagnosis includes otitis media, otitis externa, mastoiditis, and others  I reviewed the patient's past medical history.  I reviewed patient's chart from urgent care visit yesterday in which patient was diagnosed with impacted cerumen and acute otitis media.  Patient was prescribed cefdinir at that appointment  Examination today is consistent with with otitis media.  The patient has had less than 48 hours of therapy and is too early to state that she failed therapy with cefdinir.  Unfortunately she is allergic to Augmentin.  I do not feel would be appropriate at this time to switch to Levaquin until she has tried at least 48 hours of therapy.  Plan to discharge patient home at this time with prescription for short course of hydrocodone and recommendations to continue cefdinir.  If patient  sees no improvement by 72 hours she should return to the emergency department for reevaluation.        Final Clinical Impression(s) / ED Diagnoses Final diagnoses:  Acute otitis media, unspecified otitis media type    Rx / DC Orders ED Discharge Orders          Ordered    HYDROcodone-acetaminophen (NORCO/VICODIN) 5-325 MG tablet  Every 6 hours PRN        02/01/22 1811              Ronny Bacon 02/01/22 1833    Tegeler, Gwenyth Allegra, MD 02/01/22 [phone]

## 2022-02-01 NOTE — Discharge Instructions (Addendum)
You were seen today for continued ear pain from an infection called otitis media.  Please continue to take the cefdinir prescribed by outpatient care.  Please allow the medication 48 to 72 hours to see improvement.  If you continue to have severe pain at that time please seek reevaluation.  I have prescribed a short course of pain medication to help you over the next 2 days.  Please take as directed.

## 2022-02-04 ENCOUNTER — Telehealth: Payer: Self-pay | Admitting: Family

## 2022-02-04 NOTE — Telephone Encounter (Signed)
Called numbers listed but no answer, lvm. Patient's pcp not in office and this was given to her by another provider. If she is having significant pain after her er visit,she  will need in person office visit.

## 2022-02-04 NOTE — Telephone Encounter (Signed)
Prescription Request  02/04/2022  Is this a "Controlled Substance" medicine? Yes  LOV: 10/01/2021  What is the name of the medication or equipment?   HYDROcodone-acetaminophen (NORCO/VICODIN) 5-325 MG tablet [[id number]]   Have you contacted your pharmacy to request a refill? No   Which pharmacy would you like this sent to?   [id number]- [place], [place] - [id number] [id number]: [phone]: [phone]  Patient notified that their request is being sent to the clinical staff for review and that they should receive a response within 2 business days.   Please advise at Mobile [phone](mobile)

## 2022-02-05 ENCOUNTER — Ambulatory Visit (INDEPENDENT_AMBULATORY_CARE_PROVIDER_SITE_OTHER): Payer: Medicare HMO | Admitting: Medical

## 2022-02-05 VITALS — BP 148/76 | HR 77 | Temp 98.1°F | Resp 18 | Ht 65.0 in | Wt 187.0 lb

## 2022-02-05 DIAGNOSIS — H6690 Otitis media, unspecified, unspecified ear: Secondary | ICD-10-CM | POA: Diagnosis not present

## 2022-02-05 DIAGNOSIS — R519 Headache, unspecified: Secondary | ICD-10-CM | POA: Diagnosis not present

## 2022-02-05 MED ORDER — CEFTRIAXONE SODIUM 1 G IJ SOLR
1.0000 g | Freq: Once | INTRAMUSCULAR | Status: AC
  Administered 2022-02-05: 1 g via INTRAMUSCULAR

## 2022-02-05 MED ORDER — HYDROCODONE-ACETAMINOPHEN 5-325 MG PO TABS: 1.0000 | ORAL_TABLET | Freq: Four times a day (QID) | ORAL | 0 refills | Status: DC | PRN

## 2022-02-05 MED ORDER — NEOMYCIN-POLYMYXIN-HC 3.5-10000-1 OT SOLN: 3.0000 [drp] | Freq: Four times a day (QID) | OTIC | 0 refills | Status: DC

## 2022-02-05 MED ORDER — FAMCICLOVIR 500 MG PO TABS: 500.0000 mg | ORAL_TABLET | Freq: Three times a day (TID) | ORAL | 0 refills | Status: DC

## 2022-02-05 NOTE — Telephone Encounter (Signed)
Called but no answer and patient has not return call
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
## 2022-02-05 NOTE — Patient Instructions (Addendum)
Recent onset of right ear pain after ear lavage.  Emergency department diagnosed you with probable otitis media.  Symptoms have improved some but still having some significant pain despite being on cefdinir for 5 days.  On exam tympanic membrane looks mildly red.  The ear canal looks mildly swollen.  No mastoid bone tenderness and no tragal tenderness.  For persisting ear infection with severe pain and mild otitis externa advise continuing cefdinir and we gave 1 g Rocephin IM today.  Will prescribe Cortisporin otic drops as well.  Making short course of Norco available for severe pain.  Long discussion of the severe scalp sensitivity and small bumps on right-sided cheek cause concern for possible shingles infection as well.  It is hard to visualize full scalp due to hair blocking view on inspection.  I think it is best to give you Famvir antiviral in the event of shingles infection.  Follow-up Friday morning or sooner if needed.

## 2022-02-07 ENCOUNTER — Other Ambulatory Visit: Payer: Self-pay

## 2022-02-07 ENCOUNTER — Encounter: Payer: Self-pay | Admitting: Medical

## 2022-02-07 ENCOUNTER — Ambulatory Visit (INDEPENDENT_AMBULATORY_CARE_PROVIDER_SITE_OTHER): Payer: Medicare HMO | Admitting: Medical

## 2022-02-07 VITALS — BP 145/75 | HR 60 | Temp 98.2°F | Resp 18 | Ht 65.0 in | Wt 187.0 lb

## 2022-02-07 DIAGNOSIS — H6690 Otitis media, unspecified, unspecified ear: Secondary | ICD-10-CM | POA: Diagnosis not present

## 2022-02-07 DIAGNOSIS — Z794 Long term (current) use of insulin: Secondary | ICD-10-CM

## 2022-02-07 MED ORDER — TRAMADOL HCL 50 MG PO TABS: 50.0000 mg | ORAL_TABLET | Freq: Four times a day (QID) | ORAL | 0 refills | Status: AC | PRN

## 2022-02-07 MED ORDER — METFORMIN HCL 1000 MG PO TABS: 1000.0000 mg | ORAL_TABLET | Freq: Two times a day (BID) | ORAL | 3 refills | Status: DC

## 2022-02-07 MED ORDER — CEFTRIAXONE SODIUM 1 G IJ SOLR
1.0000 g | Freq: Once | INTRAMUSCULAR | Status: AC
  Administered 2022-02-07: 1 g via INTRAMUSCULAR

## 2022-02-07 NOTE — Addendum Note (Signed)
Addended by: Jeronimo Greaves on: 02/07/2022 10:38 AM   Modules accepted: Orders

## 2022-02-07 NOTE — Patient Instructions (Addendum)
Right ear pain improved /decreased after using Rocephin 1 g on last visit.  Recommend another injection of Rocephin today and continue cefdinir oral antibiotic.  On exam today  no mastoid area tenderness.  If over holiday weekend begin to get pain behind the ear or  recurrent severe ear pain  again then recommend emergency department evaluation.  Scalp pain now resolved.  Recommend continuing the Famvir as this pain along with the bumps on your face could have represented early shingles eruption.  Recent constipation since you use Norco.  Presently recommend just using Tylenol for pain and will provide limited number of low-dose tramadol to use for pain not responsive to tylenol.  Important not to use Norco and tramadol together.  Make sure you are well-hydrated and can use Dulcolax to help with constipation.  Follow-up on Tuesday or Wednesday next week with PCP or emergency department if needed over the long holiday weekend.
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
## 2022-02-12 ENCOUNTER — Ambulatory Visit (INDEPENDENT_AMBULATORY_CARE_PROVIDER_SITE_OTHER): Payer: Medicare HMO | Admitting: Family

## 2022-02-12 VITALS — BP 154/65 | HR 58 | Temp 98.6°F | Resp 16 | Wt 184.0 lb

## 2022-02-12 DIAGNOSIS — E1142 Type 2 diabetes mellitus with diabetic polyneuropathy: Secondary | ICD-10-CM

## 2022-02-12 DIAGNOSIS — H6690 Otitis media, unspecified, unspecified ear: Secondary | ICD-10-CM | POA: Diagnosis not present

## 2022-02-12 DIAGNOSIS — Z794 Long term (current) use of insulin: Secondary | ICD-10-CM

## 2022-02-12 DIAGNOSIS — E1159 Type 2 diabetes mellitus with other circulatory complications: Secondary | ICD-10-CM

## 2022-02-12 DIAGNOSIS — I10 Essential (primary) hypertension: Secondary | ICD-10-CM

## 2022-02-12 DIAGNOSIS — Z23 Encounter for immunization: Secondary | ICD-10-CM | POA: Diagnosis not present

## 2022-02-12 MED ORDER — METFORMIN HCL 1000 MG PO TABS: 1000.0000 mg | ORAL_TABLET | Freq: Two times a day (BID) | ORAL | 0 refills | Status: DC

## 2022-02-12 MED ORDER — LOSARTAN POTASSIUM 50 MG PO TABS: 50.0000 mg | ORAL_TABLET | Freq: Every day | ORAL | 0 refills | Status: DC
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
## 2022-02-12 NOTE — Patient Instructions (Signed)
Please increase losartan from '25mg'$  to '50mg'$ .

## 2022-02-12 NOTE — Progress Notes (Unsigned)
Complete physical exam  Patient: Jacqueline [name]   DOB: 09/19/1950   72 y.o. Female  MRN: [id number]  Subjective:    Chief Complaint  Patient presents with   Otitis Media    Here for follow up, doing much better   Hypertension    Here for follow up    Jacqueline [name] is a 72 y.o. female who presents today for a complete physical exam. She reports consuming a {diet types:17450} diet. {types:19826} She generally feels {DESC; WELL/FAIRLY WELL/POORLY:18703}. She reports sleeping {DESC; WELL/FAIRLY WELL/POORLY:18703}. She {does/does not:200015} have additional problems to discuss today.    Most recent fall risk assessment:    10/07/2021    8:40 AM  Fall Risk   Falls in the past year? 1  Number falls in past yr: 1  Injury with Fall? 1  Comment loose teeth     Most recent depression screenings:    02/12/2022    9:26 AM 10/11/2020   11:22 AM  PHQ 2/9 Scores  PHQ - 2 Score 0 2  Exception Documentation  Patient refusal    {VISON DENTAL STD PSA (Optional):27386}  {History (Optional):23778}  Patient Care Team: [name], NP as PCP - General (Internal Medicine) Garvin Fila, MD as Referring Physician (Neurology)   Outpatient Medications Prior to Visit  Medication Sig   cefdinir (OMNICEF) 300 MG capsule Take 1 capsule (300 mg total) by mouth 2 (two) times daily.   cetirizine (ZYRTEC ALLERGY) 10 MG tablet Take 1 tablet (10 mg total) by mouth daily.   famciclovir (FAMVIR) 500 MG tablet Take 1 tablet (500 mg total) by mouth 3 (three) times daily.   glucose blood (ACCU-CHEK GUIDE) test strip TEST AS INSTRUCTED   insulin detemir (LEVEMIR FLEXPEN) 100 UNIT/ML FlexPen Inject 20 Units into the skin daily.   insulin glargine (LANTUS) 100 UNIT/ML injection INJECT 20 UNITS INTO THE SKIN DAILY   Insulin Pen Needle (BD PEN NEEDLE NANO 2ND GEN) 32G X 4 MM MISC Inject 1 Device into the skin daily in the afternoon. USE DAILY AS DIRECTED   Lancets (ACCU-CHEK MULTICLIX) lancets Use as  instructed to test blood sugars 3 times daily E11.65   neomycin-polymyxin-hydrocortisone (CORTISPORIN) OTIC solution Place 3 drops into the right ear 4 (four) times daily.   rosuvastatin (CRESTOR) 40 MG tablet Take 1 tablet (40 mg total) by mouth at bedtime.   topiramate (TOPAMAX) 50 MG tablet Take 1 tablet (50 mg total) by mouth 2 (two) times daily.   traMADol (ULTRAM) 50 MG tablet Take 1 tablet (50 mg total) by mouth every 6 (six) hours as needed for up to 5 days for severe pain.   Vitamin D, Ergocalciferol, (DRISDOL) 1.25 MG (50000 UNIT) CAPS capsule Take 1 capsule (50,000 Units total) by mouth every 7 (seven) days.   [DISCONTINUED] losartan (COZAAR) 25 MG tablet TAKE 1 TABLET EVERY DAY   [DISCONTINUED] metFORMIN (GLUCOPHAGE) 1000 MG tablet Take 1 tablet (1,000 mg total) by mouth 2 (two) times daily with a meal.   clopidogrel (PLAVIX) 75 MG tablet Take 1 tablet (75 mg total) by mouth daily.   Semaglutide (RYBELSUS) 7 MG TABS Take 7 mg by mouth daily.   No facility-administered medications prior to visit.    ROS        Objective:     BP (!) 154/65 (BP Location: Right Arm, Patient Position: Sitting, Cuff Size: Small)   Pulse (!) 58   Temp 98.6 F (37 C) (Oral)   Resp 16  Wt 184 lb (83.5 kg)   SpO2 98%   BMI 30.62 kg/m  {Vitals History (Optional):23777}  Physical Exam   No results found for any visits on 02/12/22. {Show previous labs (optional):23779}    Assessment & Plan:    Routine Health Maintenance and Physical Exam  Immunization History  Administered Date(s) Administered   Fluad Quad(high Dose 65+) 10/09/2020   Influenza Inj Mdck Quad With Preservative 01/22/2018   Influenza, High Dose Seasonal PF 12/29/2014, 11/14/2018   Influenza-Unspecified 01/22/2018   Moderna SARS-COV2 Booster Vaccination 05/19/2019   Moderna Sars-Covid-2 Vaccination 04/21/2019   Pneumococcal Conjugate-13 07/17/2017   Pneumococcal Polysaccharide-23 06/07/2021   Tdap 08/09/2016    Zoster, Live 04/22/2013    Health Maintenance  Topic Date Due   Medicare Annual Wellness (AWV)  Never done   COLONOSCOPY (Pts 45-49yr Insurance coverage will need to be confirmed)  Never done   Zoster Vaccines- Shingrix (1 of 2) Never done   Diabetic kidney evaluation - Urine ACR  04/02/2021   INFLUENZA VACCINE  09/10/2021   COVID-19 Vaccine (3 - 2023-24 season) 10/11/2021   Diabetic kidney evaluation - eGFR measurement  04/14/2022   HEMOGLOBIN A1C  04/28/2022   FOOT EXAM  10/02/2022   MAMMOGRAM  11/29/2022   OPHTHALMOLOGY EXAM  11/29/2022   DTaP/Tdap/Td (2 - Td or Tdap) 08/10/2026   Pneumonia Vaccine 72 Years old  Completed   DEXA SCAN  Completed   Hepatitis C Screening  Completed   HPV VACCINES  Aged Out    Discussed health benefits of physical activity, and encouraged her to engage in regular exercise appropriate for her age and condition.  Problem List Items Addressed This Visit       Unprioritized   Type 2 diabetes mellitus with diabetic polyneuropathy, with long-term current use of insulin (HCC)   Relevant Medications   losartan (COZAAR) 50 MG tablet   metFORMIN (GLUCOPHAGE) 1000 MG tablet   Other Visit Diagnoses     Needs flu shot    -  Primary   Relevant Orders   Flu Vaccine QUAD High Dose(Fluad)      Return in about 2 weeks (around 02/26/2022) for nurse visit blood pressure check and blood work (BMET dx HTN).     MNance Pear NP

## 2022-02-12 NOTE — Assessment & Plan Note (Addendum)
BP Readings from Last 3 Encounters:  02/12/22 (!) 154/65  02/07/22 (!) 145/75  02/05/22 (!) 148/76   Uncontrolled. Will increase losartan from '25mg'$  to 50 mg.

## 2022-02-13 DIAGNOSIS — H6690 Otitis media, unspecified, unspecified ear: Secondary | ICD-10-CM | POA: Insufficient documentation

## 2022-02-13 DIAGNOSIS — H6692 Otitis media, unspecified, left ear: Secondary | ICD-10-CM | POA: Insufficient documentation

## 2022-02-13 NOTE — Assessment & Plan Note (Signed)
Lab Results  Component Value Date   HGBA1C 7.4 (A) 10/28/2021   HGBA1C 6.8 (A) 04/29/2021   HGBA1C 6.9 (A) 12/24/2020   Lab Results  Component Value Date   MICROALBUR 1.4 04/02/2020   LDLCALC 82 10/01/2021   CREATININE 0.80 04/13/2021   Last A1C was up a bit- defer management to endocrinology. Pt has follow up with endo in February.

## 2022-02-13 NOTE — Assessment & Plan Note (Signed)
>>  ASSESSMENT AND PLAN FOR ACUTE OTITIS MEDIA WRITTEN ON 02/13/2022 12:57 PM BY O'SULLIVAN, Kanasia Gayman, NP  Resolved following antibiotic treatment.

## 2022-02-13 NOTE — Assessment & Plan Note (Signed)
Resolved following antibiotic treatment.

## 2022-02-21 ENCOUNTER — Other Ambulatory Visit: Payer: Self-pay | Admitting: Family

## 2022-02-21 DIAGNOSIS — E559 Vitamin D deficiency, unspecified: Secondary | ICD-10-CM

## 2022-02-21 DIAGNOSIS — I10 Essential (primary) hypertension: Secondary | ICD-10-CM

## 2022-02-23 NOTE — Telephone Encounter (Signed)
Please call pt to schedule a follow up vit D level prior to additional vit D refills.

## 2022-02-24 NOTE — Telephone Encounter (Signed)
Pt scheduled for 02/26/22 already for labs.  Vitamin added.

## 2022-02-26 ENCOUNTER — Ambulatory Visit (INDEPENDENT_AMBULATORY_CARE_PROVIDER_SITE_OTHER): Payer: Medicare HMO

## 2022-02-26 ENCOUNTER — Other Ambulatory Visit (INDEPENDENT_AMBULATORY_CARE_PROVIDER_SITE_OTHER): Payer: Medicare HMO

## 2022-02-26 DIAGNOSIS — E559 Vitamin D deficiency, unspecified: Secondary | ICD-10-CM

## 2022-02-26 DIAGNOSIS — I10 Essential (primary) hypertension: Secondary | ICD-10-CM | POA: Diagnosis not present

## 2022-02-26 LAB — VITAMIN D 25 HYDROXY (VIT D DEFICIENCY, FRACTURES): VITD: 38.52 ng/mL (ref 30.00–100.00)

## 2022-02-26 LAB — BASIC METABOLIC PANEL
BUN: 21 mg/dL (ref 6–23)
CO2: 28 mEq/L (ref 19–32)
Calcium: 9.2 mg/dL (ref 8.4–10.5)
Chloride: 106 mEq/L (ref 96–112)
Creatinine, Ser: 0.88 mg/dL (ref 0.40–1.20)
GFR: 65.49 mL/min (ref >60.00)
Glucose, Bld: 129 mg/dL — ABNORMAL HIGH (ref 70–99)
Potassium: 4.5 mEq/L (ref 3.5–5.1)
Sodium: 141 mEq/L (ref 135–145)

## 2022-02-26 NOTE — Progress Notes (Signed)
Pt here for Blood pressure check per Melissa  Pt currently takes: Losartan '50mg'$    Pt reports compliance with medication.  BP today @ = 128/70 HR = 73  Pt advised me that she made an appointment with Melissa for Friday. BP and pulse are normal.

## 2022-02-27 ENCOUNTER — Telehealth: Payer: Self-pay | Admitting: Family

## 2022-02-27 DIAGNOSIS — E559 Vitamin D deficiency, unspecified: Secondary | ICD-10-CM

## 2022-02-27 MED ORDER — VITAMIN D3 75 MCG (3000 UT) PO TABS: 1.0000 | ORAL_TABLET | Freq: Every day | ORAL | Status: DC

## 2022-02-27 NOTE — Telephone Encounter (Signed)
Lvm for patient to call back about labs

## 2022-02-27 NOTE — Telephone Encounter (Signed)
Vit D level has returned to normal. Please start vit D 3000 iu once daily available OTC.

## 2022-02-28 ENCOUNTER — Ambulatory Visit (INDEPENDENT_AMBULATORY_CARE_PROVIDER_SITE_OTHER): Payer: Medicare HMO | Admitting: Family

## 2022-02-28 ENCOUNTER — Encounter: Payer: Self-pay | Admitting: Family

## 2022-02-28 VITALS — BP 139/72 | HR 71 | Temp 97.9°F | Resp 16 | Wt 183.0 lb

## 2022-02-28 DIAGNOSIS — M5412 Radiculopathy, cervical region: Secondary | ICD-10-CM | POA: Diagnosis not present

## 2022-02-28 DIAGNOSIS — M2550 Pain in unspecified joint: Secondary | ICD-10-CM | POA: Diagnosis not present

## 2022-02-28 DIAGNOSIS — H8110 Benign paroxysmal vertigo, unspecified ear: Secondary | ICD-10-CM | POA: Diagnosis not present

## 2022-02-28 DIAGNOSIS — Z8673 Personal history of transient ischemic attack (TIA), and cerebral infarction without residual deficits: Secondary | ICD-10-CM | POA: Diagnosis not present

## 2022-02-28 MED ORDER — PREDNISONE 10 MG PO TABS: ORAL_TABLET | ORAL | 0 refills | Status: DC

## 2022-02-28 NOTE — Assessment & Plan Note (Signed)
We discussed that if this is a side effect of Prolia, it should resolve very soon.  If it does not improve she will let me know.

## 2022-02-28 NOTE — Assessment & Plan Note (Signed)
Avoid NSAIDs due to plavix.  Recommended trial of steroid taper.  Monitor sugars closely while taking steroids and call if she has readings >350.  This may also help some with her joint pain.
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
## 2022-02-28 NOTE — Assessment & Plan Note (Signed)
Requested PT Vestibular rehab as well as strength/balance training at PT.

## 2022-02-28 NOTE — Telephone Encounter (Signed)
Was seen today .

## 2022-02-28 NOTE — Assessment & Plan Note (Signed)
She requests a referral back to outpatient PT.  Will initiate referral.

## 2022-03-04 DIAGNOSIS — L570 Actinic keratosis: Secondary | ICD-10-CM | POA: Diagnosis not present

## 2022-03-04 DIAGNOSIS — D1801 Hemangioma of skin and subcutaneous tissue: Secondary | ICD-10-CM | POA: Diagnosis not present

## 2022-03-04 DIAGNOSIS — L218 Other seborrheic dermatitis: Secondary | ICD-10-CM | POA: Diagnosis not present

## 2022-03-04 DIAGNOSIS — L814 Other melanin hyperpigmentation: Secondary | ICD-10-CM | POA: Diagnosis not present

## 2022-03-04 DIAGNOSIS — L821 Other seborrheic keratosis: Secondary | ICD-10-CM | POA: Diagnosis not present

## 2022-03-11 DIAGNOSIS — H8110 Benign paroxysmal vertigo, unspecified ear: Secondary | ICD-10-CM | POA: Diagnosis not present

## 2022-03-11 DIAGNOSIS — R531 Weakness: Secondary | ICD-10-CM | POA: Diagnosis not present

## 2022-03-11 DIAGNOSIS — I69998 Other sequelae following unspecified cerebrovascular disease: Secondary | ICD-10-CM | POA: Diagnosis not present

## 2022-03-12 ENCOUNTER — Ambulatory Visit (INDEPENDENT_AMBULATORY_CARE_PROVIDER_SITE_OTHER): Payer: Medicare HMO | Admitting: Medical

## 2022-03-12 ENCOUNTER — Telehealth: Payer: Self-pay | Admitting: Family

## 2022-03-12 ENCOUNTER — Other Ambulatory Visit: Payer: Self-pay | Admitting: Internal Medicine

## 2022-03-12 VITALS — BP 112/62 | HR 76 | Temp 98.0°F | Resp 16 | Ht 65.0 in | Wt 179.0 lb

## 2022-03-12 DIAGNOSIS — I959 Hypotension, unspecified: Secondary | ICD-10-CM

## 2022-03-12 DIAGNOSIS — R42 Dizziness and giddiness: Secondary | ICD-10-CM

## 2022-03-12 DIAGNOSIS — R5383 Other fatigue: Secondary | ICD-10-CM

## 2022-03-12 NOTE — Telephone Encounter (Signed)
Appt w/ Percell Miller.

## 2022-03-12 NOTE — Telephone Encounter (Signed)
Initial Comment Caller states her blood pressure is 105/60 and she wants to know if that's normal. Translation No Nurse Assessment Nurse: Toribio Harbour, RN, Joelene Millin Date/Time (Eastern Time): 03/12/2022 12:19:19 PM Confirm and document reason for call. If symptomatic, describe symptoms. ---Caller states her blood pressure is 105/60 and she wants to know if that's normal. She is on medication for HTN. She does feel lightheaded for the past 2 days. Does the patient have any new or worsening symptoms? ---Yes Will a triage be completed? ---Yes Related visit to physician within the last 2 weeks? ---Yes Does the PT have any chronic conditions? (i.e. diabetes, asthma, this includes High risk factors for pregnancy, etc.) ---Yes List chronic conditions. ---HTN, diabetes, stroke Is this a behavioral health or substance abuse call? ---No Guidelines Guideline Title Affirmed Question Affirmed Notes Nurse Date/Time (Eastern Time) Dizziness - Lightheadedness [1] MODERATE dizziness (e.g., interferes with normal activities) AND [2] has NOT been evaluated by doctor (or NP/PA) for this (Exception: Dizziness caused by heat exposure, sudden Toribio Harbour, RN, Joelene Millin 03/12/2022 12:21:12 PM PLEASE NOTE: All timestamps contained within this report are represented as [place] Standard Time. CONFIDENTIALTY NOTICE: This fax transmission is intended only for the addressee. It contains information that is legally privileged, confidential or otherwise protected from use or disclosure. If you are not the intended recipient, you are strictly prohibited from reviewing, disclosing, copying using or disseminating any of this information or taking any action in reliance on or regarding this information. If you have received this fax in error, please notify us immediately by telephone so that we can arrange for its return to [place]. Phone: [phone], Toll-Free: [phone], Fax: [phone] Page: 2 of 2 Call Id:  [id number] Guidelines Guideline Title Affirmed Question Affirmed Notes Nurse Date/Time Eilene Ghazi Time) standing, or poor fluid intake.) Disp. Time Eilene Ghazi Time) Disposition Final User 03/12/2022 12:26:56 PM See PCP within 24 Hours Yes Toribio Harbour, RN, Joelene Millin Final Disposition 03/12/2022 12:26:56 PM See PCP within 24 Hours Yes Toribio Harbour, RN, Renea Ee Disagree/Comply Comply Caller Understands Yes PreDisposition Call Doctor Care Advice Given Per Guideline SEE PCP WITHIN 24 HOURS: * IF OFFICE WILL BE OPEN: You need to be examined within the next 24 hours. Call your doctor (or NP/PA) when the office opens and make an appointment. DRINK FLUIDS: * Drink several glasses of fruit juice, other clear fluids or water. * This will improve hydration and blood glucose. LIE DOWN AND REST: * Lie down with feet elevated for 1 hour. * This will improve circulation and increase blood flow to the brain. CALL BACK IF: * Passes out (faints) * You become worse CARE ADVICE given per Dizziness (Adult) guideline. Referrals REFERRED TO PCP OFFICE
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
## 2022-03-12 NOTE — Patient Instructions (Addendum)
Recent dizziness, fatigue and hypotension compared to her previous baseline blood pressure readings.  Seem to occur shortly after increase of losartan to 100 mg.  Without the drop seems at proportion to that dose.  Blood pressure still lower and despite resuming her 50 mg losartan.   Stable neurologic exam and no vertigo on exam.  Alert and oriented. No cardiac symptoms.  Advising patient to stay on losartan 50 mg presently.  Will get stat CBC and CMP.  Other labs ordered routinely TSH, iron level, B12 and B1.  Check blood pressures daily.  If blood pressure reading less than 631 systolic would likely decrease losartan to 25 mg.   Follow-up in 7 to 10 days with PCP or sooner if needed.

## 2022-03-12 NOTE — Telephone Encounter (Signed)
Patient called to advise that her blood pressure keeps dropping. Her reading today was 104/60. She said she doubled up on her medication but it keeps dropping. Patient kept mixing up blood sugar and blood pressure, not sure if this has anything to do with her blood pressure or not. Patient was transferred to triage. Advised I would send message to [place] as well.

## 2022-03-13 LAB — CBC WITH DIFFERENTIAL/PLATELET
Absolute Monocytes: 693 cells/uL (ref 200–950)
Basophils Absolute: 28 cells/uL (ref 0–200)
Basophils Relative: 0.4 %
Eosinophils Absolute: 119 cells/uL (ref 15–500)
Eosinophils Relative: 1.7 %
HCT: 39.9 % (ref 35.0–45.0)
Hemoglobin: 13.8 g/dL (ref 11.7–15.5)
Lymphs Abs: 1897 cells/uL (ref 850–3900)
MCH: 29.6 pg (ref 27.0–33.0)
MCHC: 34.6 g/dL (ref 32.0–36.0)
MCV: 85.4 fL (ref 80.0–100.0)
MPV: 10.9 fL (ref 7.5–12.5)
Monocytes Relative: 9.9 %
Neutro Abs: 4263 cells/uL (ref 1500–7800)
Neutrophils Relative %: 60.9 %
Platelets: 211 10*3/uL (ref 140–400)
RBC: 4.67 10*6/uL (ref 3.80–5.10)
RDW: 12.5 % (ref 11.0–15.0)
Total Lymphocyte: 27.1 %
WBC: 7 10*3/uL (ref 3.8–10.8)

## 2022-03-13 LAB — COMPREHENSIVE METABOLIC PANEL
AG Ratio: 2.3 (calc) (ref 1.0–2.5)
ALT: 13 U/L (ref 6–29)
AST: 14 U/L (ref 10–35)
Albumin: 4.8 g/dL (ref 3.6–5.1)
Alkaline phosphatase (APISO): 47 U/L (ref 37–153)
BUN/Creatinine Ratio: 14 (calc) (ref 6–22)
BUN: 18 mg/dL (ref 7–25)
CO2: 26 mmol/L (ref 20–32)
Calcium: 10.3 mg/dL (ref 8.6–10.4)
Chloride: 104 mmol/L (ref 98–110)
Creat: 1.27 mg/dL — ABNORMAL HIGH (ref 0.60–1.00)
Globulin: 2.1 g/dL (calc) (ref 1.9–3.7)
Glucose, Bld: 127 mg/dL — ABNORMAL HIGH (ref 65–99)
Potassium: 5.3 mmol/L (ref 3.5–5.3)
Sodium: 140 mmol/L (ref 135–146)
Total Bilirubin: 0.7 mg/dL (ref 0.2–1.2)
Total Protein: 6.9 g/dL (ref 6.1–8.1)

## 2022-03-13 LAB — TSH: TSH: 5.03 mIU/L — ABNORMAL HIGH (ref 0.40–4.50)

## 2022-03-13 LAB — IRON: Iron: 62 ug/dL (ref 45–160)

## 2022-03-13 LAB — VITAMIN B12: Vitamin B-12: 250 pg/mL (ref 200–1100)

## 2022-03-16 LAB — VITAMIN B1: Vitamin B1 (Thiamine): 10 nmol/L (ref 8–30)

## 2022-03-17 ENCOUNTER — Ambulatory Visit (INDEPENDENT_AMBULATORY_CARE_PROVIDER_SITE_OTHER): Payer: Medicare HMO | Admitting: Family

## 2022-03-17 VITALS — BP 113/56 | HR 79 | Temp 97.8°F | Resp 16 | Wt 181.0 lb

## 2022-03-17 DIAGNOSIS — I10 Essential (primary) hypertension: Secondary | ICD-10-CM | POA: Diagnosis not present
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
## 2022-03-17 NOTE — Assessment & Plan Note (Signed)
BP is stable on losartan '25mg'$  once daily. Continue same.

## 2022-03-21 DIAGNOSIS — I69998 Other sequelae following unspecified cerebrovascular disease: Secondary | ICD-10-CM | POA: Diagnosis not present

## 2022-03-21 DIAGNOSIS — R531 Weakness: Secondary | ICD-10-CM | POA: Diagnosis not present

## 2022-03-21 DIAGNOSIS — H8110 Benign paroxysmal vertigo, unspecified ear: Secondary | ICD-10-CM | POA: Diagnosis not present

## 2022-04-04 ENCOUNTER — Ambulatory Visit: Payer: Medicare HMO | Admitting: Family
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
## 2022-04-09 ENCOUNTER — Ambulatory Visit: Payer: Medicare HMO | Admitting: Adult Health

## 2022-04-09 ENCOUNTER — Encounter: Payer: Self-pay | Admitting: Adult Health

## 2022-04-09 VITALS — BP 111/65 | HR 70 | Ht 65.0 in | Wt 180.0 lb

## 2022-04-09 DIAGNOSIS — G459 Transient cerebral ischemic attack, unspecified: Secondary | ICD-10-CM | POA: Diagnosis not present

## 2022-04-09 DIAGNOSIS — G43109 Migraine with aura, not intractable, without status migrainosus: Secondary | ICD-10-CM

## 2022-04-09 DIAGNOSIS — I6523 Occlusion and stenosis of bilateral carotid arteries: Secondary | ICD-10-CM

## 2022-04-09 DIAGNOSIS — I639 Cerebral infarction, unspecified: Secondary | ICD-10-CM

## 2022-04-09 MED ORDER — TOPIRAMATE 50 MG PO TABS: 50.0000 mg | ORAL_TABLET | Freq: Two times a day (BID) | ORAL | 3 refills | Status: DC

## 2022-04-09 NOTE — Patient Instructions (Addendum)
Your Plan:  Continue topamax '50mg'$  twice daily   Samples for both Ubrelvy and Nurtec provided that you can take at onset of migraine headache. Please only take one or the other in a 24 hour period  [place] - can repeat x1 after 2 hrs if headache persists, max dose '200mg'$ /24hrs Nurtec - only 1 tab in a 24 hr period   Please call if either is effective for official prescription     Follow-up in 1 year or call earlier if needed    Thank you for coming to see [place] at [facility]. I hope we have been able to provide you high quality care today.  You may receive a patient satisfaction survey over the next few weeks. We would appreciate your feedback and comments so that we may continue to improve ourselves and the health of our patients.

## 2022-04-11 ENCOUNTER — Encounter: Payer: Self-pay | Admitting: Internal Medicine

## 2022-04-11 ENCOUNTER — Ambulatory Visit: Payer: Medicare HMO | Admitting: Internal Medicine

## 2022-04-11 VITALS — BP 124/72 | HR 67 | Ht 65.0 in | Wt 180.6 lb

## 2022-04-11 DIAGNOSIS — E1159 Type 2 diabetes mellitus with other circulatory complications: Secondary | ICD-10-CM

## 2022-04-11 DIAGNOSIS — E1165 Type 2 diabetes mellitus with hyperglycemia: Secondary | ICD-10-CM | POA: Diagnosis not present

## 2022-04-11 DIAGNOSIS — E038 Other specified hypothyroidism: Secondary | ICD-10-CM

## 2022-04-11 DIAGNOSIS — E1142 Type 2 diabetes mellitus with diabetic polyneuropathy: Secondary | ICD-10-CM | POA: Diagnosis not present

## 2022-04-11 DIAGNOSIS — Z794 Long term (current) use of insulin: Secondary | ICD-10-CM | POA: Diagnosis not present

## 2022-04-11 LAB — T4, FREE: Free T4: 0.71 ng/dL (ref 0.60–1.60)

## 2022-04-11 LAB — POCT GLYCOSYLATED HEMOGLOBIN (HGB A1C): Hemoglobin A1C: 7.3 % — AB (ref 4.0–5.6)

## 2022-04-11 LAB — TSH: TSH: 5.34 u[IU]/mL (ref 0.35–5.50)

## 2022-04-11 MED ORDER — GABAPENTIN 10 % EX CREA: 1.0000 | TOPICAL_CREAM | Freq: Every day | CUTANEOUS | 3 refills | Status: DC

## 2022-04-11 NOTE — Patient Instructions (Signed)
-   Continue Metformin 1000 mg , 1 tablet Twice a day  - Increase  Levemir/Lantus to  22 units daily    HOW TO TREAT LOW BLOOD SUGARS (Blood sugar LESS THAN 70 MG/DL) Please follow the RULE OF 15 for the treatment of hypoglycemia treatment (when your (blood sugars are less than 70 mg/dL)   STEP 1: Take 15 grams of carbohydrates when your blood sugar is low, which includes:  3-4 GLUCOSE TABS  OR 3-4 OZ OF JUICE OR REGULAR SODA OR ONE TUBE OF GLUCOSE GEL    STEP 2: RECHECK blood sugar in 15 MINUTES STEP 3: If your blood sugar is still low at the 15 minute recheck --> then, go back to STEP 1 and treat AGAIN with another 15 grams of carbohydrates.
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
## 2022-04-16 ENCOUNTER — Other Ambulatory Visit: Payer: Self-pay | Admitting: Family

## 2022-04-16 MED ORDER — VITAMIN D3 75 MCG (3000 UT) PO TABS: 1.0000 | ORAL_TABLET | Freq: Every day | ORAL | Status: AC

## 2022-04-16 NOTE — Telephone Encounter (Signed)
I sent refill for crestor. I did not send high dose vit D refill- she should switch to vit D 3000 iu once daily otc please.

## 2022-04-16 NOTE — Telephone Encounter (Signed)
Patient advised of prescription and to take otc vit d

## 2022-04-30 ENCOUNTER — Ambulatory Visit: Payer: Medicare HMO | Admitting: Internal Medicine

## 2022-05-18 ENCOUNTER — Other Ambulatory Visit: Payer: Self-pay | Admitting: Family

## 2022-05-19 ENCOUNTER — Ambulatory Visit: Payer: Medicare HMO | Admitting: Podiatry

## 2022-05-29 ENCOUNTER — Other Ambulatory Visit: Payer: Self-pay | Admitting: Family

## 2022-05-30 ENCOUNTER — Ambulatory Visit (INDEPENDENT_AMBULATORY_CARE_PROVIDER_SITE_OTHER): Payer: Medicare HMO | Admitting: Family

## 2022-05-30 VITALS — BP 128/66 | HR 84 | Temp 98.1°F | Resp 16 | Wt 177.0 lb

## 2022-05-30 DIAGNOSIS — I10 Essential (primary) hypertension: Secondary | ICD-10-CM | POA: Diagnosis not present

## 2022-05-30 DIAGNOSIS — Z794 Long term (current) use of insulin: Secondary | ICD-10-CM | POA: Diagnosis not present

## 2022-05-30 DIAGNOSIS — G43909 Migraine, unspecified, not intractable, without status migrainosus: Secondary | ICD-10-CM

## 2022-05-30 DIAGNOSIS — K219 Gastro-esophageal reflux disease without esophagitis: Secondary | ICD-10-CM

## 2022-05-30 DIAGNOSIS — E559 Vitamin D deficiency, unspecified: Secondary | ICD-10-CM

## 2022-05-30 DIAGNOSIS — E785 Hyperlipidemia, unspecified: Secondary | ICD-10-CM

## 2022-05-30 DIAGNOSIS — E1165 Type 2 diabetes mellitus with hyperglycemia: Secondary | ICD-10-CM

## 2022-05-30 LAB — COMPREHENSIVE METABOLIC PANEL
ALT: 12 U/L (ref 0–35)
AST: 16 U/L (ref 0–37)
Albumin: 4.4 g/dL (ref 3.5–5.2)
Alkaline Phosphatase: 38 U/L — ABNORMAL LOW (ref 39–117)
BUN: 15 mg/dL (ref 6–23)
CO2: 25 mEq/L (ref 19–32)
Calcium: 9.1 mg/dL (ref 8.4–10.5)
Chloride: 106 mEq/L (ref 96–112)
Creatinine, Ser: 1.03 mg/dL (ref 0.40–1.20)
GFR: 54.13 mL/min — ABNORMAL LOW (ref >60.00)
Glucose, Bld: 134 mg/dL — ABNORMAL HIGH (ref 70–99)
Potassium: 4.5 mEq/L (ref 3.5–5.1)
Sodium: 139 mEq/L (ref 135–145)
Total Bilirubin: 0.7 mg/dL (ref 0.2–1.2)
Total Protein: 6.3 g/dL (ref 6.0–8.3)

## 2022-05-30 LAB — LIPID PANEL
Cholesterol: 91 mg/dL (ref 0–200)
HDL: 41.8 mg/dL (ref >39.00)
LDL Cholesterol: 22 mg/dL (ref 0–99)
NonHDL: 48.89
Total CHOL/HDL Ratio: 2
Triglycerides: 133 mg/dL (ref 0.0–149.0)
VLDL: 26.6 mg/dL (ref 0.0–40.0)

## 2022-05-30 LAB — MICROALBUMIN / CREATININE URINE RATIO
Creatinine,U: 92.9 mg/dL
Microalb Creat Ratio: 44.4 mg/g — ABNORMAL HIGH (ref 0.0–30.0)
Microalb, Ur: 41.2 mg/dL — ABNORMAL HIGH (ref 0.0–1.9)

## 2022-05-30 MED ORDER — PANTOPRAZOLE SODIUM 20 MG PO TBEC: 20.0000 mg | DELAYED_RELEASE_TABLET | Freq: Every day | ORAL | 1 refills | Status: AC

## 2022-05-30 NOTE — Assessment & Plan Note (Signed)
Continue vit D 3000 iu once daily. Last level WNL.

## 2022-05-30 NOTE — Assessment & Plan Note (Signed)
At goal on current medications. 

## 2022-05-30 NOTE — Assessment & Plan Note (Signed)
Fair control- management per endocrinology.

## 2022-05-30 NOTE — Assessment & Plan Note (Signed)
New. Discussed GERD diet/gerd precautions- will rx with pantoprazole  trial.

## 2022-05-30 NOTE — Assessment & Plan Note (Signed)
Last LDL near goal at 82. Continue crestor.
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
## 2022-05-30 NOTE — Assessment & Plan Note (Signed)
Migraines stable- management pr neuro.

## 2022-06-02 ENCOUNTER — Ambulatory Visit (INDEPENDENT_AMBULATORY_CARE_PROVIDER_SITE_OTHER): Payer: Medicare HMO

## 2022-06-02 ENCOUNTER — Encounter: Payer: Self-pay | Admitting: Podiatry

## 2022-06-02 ENCOUNTER — Ambulatory Visit: Payer: Medicare HMO | Admitting: Podiatry

## 2022-06-02 DIAGNOSIS — M19072 Primary osteoarthritis, left ankle and foot: Secondary | ICD-10-CM

## 2022-06-02 DIAGNOSIS — Z794 Long term (current) use of insulin: Secondary | ICD-10-CM | POA: Diagnosis not present

## 2022-06-02 DIAGNOSIS — E1142 Type 2 diabetes mellitus with diabetic polyneuropathy: Secondary | ICD-10-CM
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
## 2022-06-24 ENCOUNTER — Other Ambulatory Visit: Payer: Self-pay | Admitting: Internal Medicine
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
## 2022-07-01 ENCOUNTER — Ambulatory Visit (INDEPENDENT_AMBULATORY_CARE_PROVIDER_SITE_OTHER): Payer: Medicare HMO | Admitting: Family

## 2022-07-01 VITALS — BP 125/55 | HR 63 | Temp 98.5°F | Resp 16 | Wt 175.0 lb

## 2022-07-01 DIAGNOSIS — R519 Headache, unspecified: Secondary | ICD-10-CM | POA: Diagnosis not present

## 2022-07-01 DIAGNOSIS — M67441 Ganglion, right hand: Secondary | ICD-10-CM

## 2022-07-01 DIAGNOSIS — M67442 Ganglion, left hand: Secondary | ICD-10-CM

## 2022-07-01 NOTE — Assessment & Plan Note (Signed)
New.  No sign of trigger finger, no pain.  Reassurance provided. Will monitor.

## 2022-07-01 NOTE — Assessment & Plan Note (Signed)
>>  ASSESSMENT AND PLAN FOR NONINTRACTABLE EPISODIC HEADACHE WRITTEN ON 07/01/2022 10:44 AM BY O'SULLIVAN, Sundeep Cary, NP  Notes that symptoms seem to be worst when her allergies act up. She is no longer taking allegra.  Recommended that she restart allegra. Let me know if symptoms fail to improve and we can get her back in with her headache specialist.

## 2022-07-01 NOTE — Assessment & Plan Note (Signed)
Notes that symptoms seem to be worst when her allergies act up. She is no longer taking allegra.  Recommended that she restart allegra. Let me know if symptoms fail to improve and we can get her back in with her headache specialist.

## 2022-07-12 ENCOUNTER — Other Ambulatory Visit: Payer: Self-pay | Admitting: Family

## 2022-07-15 ENCOUNTER — Emergency Department (HOSPITAL_BASED_OUTPATIENT_CLINIC_OR_DEPARTMENT_OTHER)
Admission: EM | Admit: 2022-07-15 | Discharge: 2022-07-16 | Disposition: A | Discharge status: Home / Self Care | Payer: Medicare HMO | Attending: Emergency Medicine | Admitting: Emergency Medicine

## 2022-07-15 ENCOUNTER — Emergency Department (HOSPITAL_BASED_OUTPATIENT_CLINIC_OR_DEPARTMENT_OTHER): Payer: Medicare HMO

## 2022-07-15 ENCOUNTER — Encounter (HOSPITAL_BASED_OUTPATIENT_CLINIC_OR_DEPARTMENT_OTHER): Payer: Self-pay | Admitting: Emergency Medicine

## 2022-07-15 ENCOUNTER — Other Ambulatory Visit: Payer: Self-pay

## 2022-07-15 DIAGNOSIS — Z7984 Long term (current) use of oral hypoglycemic drugs: Secondary | ICD-10-CM | POA: Insufficient documentation

## 2022-07-15 DIAGNOSIS — Z79899 Other long term (current) drug therapy: Secondary | ICD-10-CM | POA: Diagnosis not present

## 2022-07-15 DIAGNOSIS — R109 Unspecified abdominal pain: Secondary | ICD-10-CM | POA: Diagnosis present

## 2022-07-15 DIAGNOSIS — Z794 Long term (current) use of insulin: Secondary | ICD-10-CM | POA: Insufficient documentation

## 2022-07-15 DIAGNOSIS — N132 Hydronephrosis with renal and ureteral calculous obstruction: Secondary | ICD-10-CM | POA: Insufficient documentation

## 2022-07-15 DIAGNOSIS — N200 Calculus of kidney: Secondary | ICD-10-CM

## 2022-07-15 DIAGNOSIS — K5730 Diverticulosis of large intestine without perforation or abscess without bleeding: Secondary | ICD-10-CM | POA: Diagnosis not present

## 2022-07-15 LAB — COMPREHENSIVE METABOLIC PANEL
ALT: 18 U/L (ref 0–44)
AST: 21 U/L (ref 15–41)
Albumin: 3.9 g/dL (ref 3.5–5.0)
Alkaline Phosphatase: 45 U/L (ref 38–126)
Anion gap: 8 (ref 5–15)
BUN: 16 mg/dL (ref 8–23)
CO2: 22 mmol/L (ref 22–32)
Calcium: 8.6 mg/dL — ABNORMAL LOW (ref 8.9–10.3)
Chloride: 109 mmol/L (ref 98–111)
Creatinine, Ser: 1.17 mg/dL — ABNORMAL HIGH (ref 0.44–1.00)
GFR, Estimated: 49 mL/min — ABNORMAL LOW (ref >60)
Glucose, Bld: 271 mg/dL — ABNORMAL HIGH (ref 70–99)
Potassium: 3.7 mmol/L (ref 3.5–5.1)
Sodium: 139 mmol/L (ref 135–145)
Total Bilirubin: 0.5 mg/dL (ref 0.3–1.2)
Total Protein: 7 g/dL (ref 6.5–8.1)

## 2022-07-15 LAB — URINALYSIS, ROUTINE W REFLEX MICROSCOPIC
Bilirubin Urine: NEGATIVE
Glucose, UA: >=500 mg/dL — AB
Ketones, ur: NEGATIVE mg/dL
Leukocytes,Ua: NEGATIVE
Nitrite: NEGATIVE
Protein, ur: >=300 mg/dL — AB
Specific Gravity, Urine: >=1.03 (ref 1.005–1.030)
pH: 6 (ref 5.0–8.0)

## 2022-07-15 LAB — LIPASE, BLOOD: Lipase: 35 U/L (ref 11–51)

## 2022-07-15 LAB — CBC
HCT: 37 % (ref 36.0–46.0)
Hemoglobin: 12.3 g/dL (ref 12.0–15.0)
MCH: 29.6 pg (ref 26.0–34.0)
MCHC: 33.2 g/dL (ref 30.0–36.0)
MCV: 89.2 fL (ref 80.0–100.0)
Platelets: 184 10*3/uL (ref 150–400)
RBC: 4.15 MIL/uL (ref 3.87–5.11)
RDW: 12.5 % (ref 11.5–15.5)
WBC: 5.5 10*3/uL (ref 4.0–10.5)
nRBC: 0 % (ref 0.0–0.2)

## 2022-07-15 LAB — URINALYSIS, MICROSCOPIC (REFLEX)

## 2022-07-15 MED ORDER — ONDANSETRON HCL 4 MG/2ML IJ SOLN
INTRAMUSCULAR | Status: AC
  Filled 2022-07-15: qty 2

## 2022-07-15 MED ORDER — ONDANSETRON HCL 4 MG/2ML IJ SOLN
4.0000 mg | Freq: Once | INTRAMUSCULAR | Status: AC
  Administered 2022-07-15: 4 mg via INTRAVENOUS
  Filled 2022-07-15: qty 2

## 2022-07-15 MED ORDER — TAMSULOSIN HCL 0.4 MG PO CAPS: 0.4000 mg | ORAL_CAPSULE | Freq: Every day | ORAL | 0 refills | Status: AC

## 2022-07-15 MED ORDER — ONDANSETRON 4 MG PO TBDP: 4.0000 mg | ORAL_TABLET | Freq: Three times a day (TID) | ORAL | 0 refills | Status: DC | PRN

## 2022-07-15 MED ORDER — OXYCODONE-ACETAMINOPHEN 5-325 MG PO TABS: 1.0000 | ORAL_TABLET | Freq: Four times a day (QID) | ORAL | 0 refills | Status: DC | PRN

## 2022-07-15 MED ORDER — SODIUM CHLORIDE 0.9 % IV BOLUS
1000.0000 mL | Freq: Once | INTRAVENOUS | Status: AC
  Administered 2022-07-15: 1000 mL via INTRAVENOUS

## 2022-07-15 MED ORDER — MORPHINE SULFATE (PF) 4 MG/ML IV SOLN
INTRAVENOUS | Status: AC
  Filled 2022-07-15: qty 1

## 2022-07-15 MED ORDER — MORPHINE SULFATE (PF) 4 MG/ML IV SOLN
4.0000 mg | Freq: Once | INTRAVENOUS | Status: AC
  Administered 2022-07-15: 4 mg via INTRAVENOUS
  Filled 2022-07-15: qty 1

## 2022-07-15 MED ORDER — KETOROLAC TROMETHAMINE 30 MG/ML IJ SOLN
15.0000 mg | Freq: Once | INTRAMUSCULAR | Status: AC
  Administered 2022-07-15: 15 mg via INTRAVENOUS
  Filled 2022-07-15: qty 1

## 2022-07-15 NOTE — Discharge Instructions (Signed)
You have a kidney stone which is likely the cause of your pain.  We have written for a few medications, take as prescribed.  Follow-up outpatient, return for new or worsening symptoms.

## 2022-07-15 NOTE — ED Provider Notes (Signed)
[facility] Provider Note   CSN: [id number] Arrival date & time: 07/15/22  2041    History  Chief Complaint  Patient presents with   Flank Pain    Jacqueline [name] is a 72 y.o. female history of prior kidney stones here for evaluation of left flank pain.  Began 1 hour PTA.  Described as sharp, aching.  Feels similar to prior kidney stones in the past.  No dysuria or hematuria.  No radiation.  Not worse with movement.  Some nausea that vomiting.  No fever, diarrhea, chest pain, shortness of breath, numbness or weakness.  Took Tylenol PTA.   HPI     Home Medications Prior to Admission medications   Medication Sig Start Date End Date Taking? Authorizing Provider  ondansetron (ZOFRAN-ODT) 4 MG disintegrating tablet Take 1 tablet (4 mg total) by mouth every 8 (eight) hours as needed for nausea or vomiting. 07/15/22  Yes Alvina Strother A, PA-C  oxyCODONE-acetaminophen (PERCOCET/ROXICET) 5-325 MG tablet Take 1 tablet by mouth every 6 (six) hours as needed for severe pain. 07/15/22  Yes Shanieka Blea A, PA-C  tamsulosin (FLOMAX) 0.4 MG CAPS capsule Take 1 capsule (0.4 mg total) by mouth daily for 5 days. 07/15/22 07/20/22 Yes Dwane Andres A, PA-C  Cholecalciferol (VITAMIN D3) 75 MCG (3000 UT) TABS Take 1 tablet by mouth daily at 6 (six) AM. 04/16/22   Sandford Craze, NP  clopidogrel (PLAVIX) 75 MG tablet TAKE 1 TABLET BY MOUTH EVERY DAY 05/18/22   Worthy Rancher B, FNP  Gabapentin 10 % CREA Apply 1 Application topically at bedtime. 04/11/22   Shamleffer, Konrad Dolores, MD  glucose blood (ACCU-CHEK GUIDE) test strip USE AS INSTRUCTED TO TEST BLOOD SUGARS 3 TIMES DAILY E11.65 06/24/22   Shamleffer, Konrad Dolores, MD  insulin detemir (LEVEMIR FLEXPEN) 100 UNIT/ML FlexPen Inject 20 Units into the skin daily. 10/28/21   Shamleffer, Konrad Dolores, MD  insulin glargine (LANTUS) 100 UNIT/ML injection INJECT 20 UNITS INTO THE SKIN DAILY 01/14/22    Shamleffer, Konrad Dolores, MD  Insulin Pen Needle (BD PEN NEEDLE NANO 2ND GEN) 32G X 4 MM MISC Inject 1 Device into the skin daily in the afternoon. USE DAILY AS DIRECTED 04/29/21   Shamleffer, Konrad Dolores, MD  Lancets (ACCU-CHEK MULTICLIX) lancets Use as instructed to test blood sugars 3 times daily E11.65 10/04/18   Shamleffer, Konrad Dolores, MD  losartan (COZAAR) 50 MG tablet Take 1 tablet (50 mg total) by mouth daily. 02/12/22   Sandford Craze, NP  metFORMIN (GLUCOPHAGE) 1000 MG tablet Take 1 tablet (1,000 mg total) by mouth 2 (two) times daily with a meal. 02/12/22   Sandford Craze, NP  neomycin-polymyxin-hydrocortisone (CORTISPORIN) OTIC solution Place 3 drops into the right ear 4 (four) times daily. 02/05/22   Saguier, Ramon Dredge, PA-C  pantoprazole (PROTONIX) 20 MG tablet Take 1 tablet (20 mg total) by mouth daily. 05/30/22   Sandford Craze, NP  rosuvastatin (CRESTOR) 40 MG tablet TAKE 1 TABLET BY MOUTH EVERYDAY AT BEDTIME 07/12/22   Sandford Craze, NP  topiramate (TOPAMAX) 50 MG tablet Take 1 tablet (50 mg total) by mouth 2 (two) times daily. 04/09/22   Ihor Austin, NP      Allergies    Augmentin [amoxicillin-pot clavulanate], Definity [perflutren lipid microsphere], Novocain [procaine], Promethazine, and Prozac [fluoxetine hcl]    Review of Systems   Review of Systems  Constitutional: Negative.   HENT: Negative.    Respiratory: Negative.    Cardiovascular: Negative.  Gastrointestinal: Negative.   Genitourinary:  Positive for flank pain. Negative for decreased urine volume, difficulty urinating, dyspareunia, dysuria, enuresis, frequency, genital sores, hematuria, menstrual problem, pelvic pain, urgency, vaginal bleeding, vaginal discharge and vaginal pain.  Skin: Negative.   Neurological: Negative.   All other systems reviewed and are negative.   Physical Exam Updated Vital Signs BP 121/68   Pulse 64   Temp 98 F (36.7 C) (Oral)   Resp 20   Ht 5\' 5"  (1.651  m)   Wt 79.8 kg   SpO2 97%   BMI 29.29 kg/m  Physical Exam Vitals and nursing note reviewed.  Constitutional:      General: She is not in acute distress.    Appearance: She is well-developed. She is not ill-appearing, toxic-appearing or diaphoretic.  HENT:     Head: Normocephalic and atraumatic.     Nose: Nose normal.     Mouth/Throat:     Mouth: Mucous membranes are moist.  Eyes:     Pupils: Pupils are equal, round, and reactive to light.  Cardiovascular:     Rate and Rhythm: Normal rate.     Pulses: Normal pulses.     Heart sounds: Normal heart sounds.  Pulmonary:     Effort: Pulmonary effort is normal. No respiratory distress.     Breath sounds: Normal breath sounds.  Abdominal:     General: Bowel sounds are normal. There is no distension.     Palpations: Abdomen is soft.     Tenderness: There is no abdominal tenderness. There is no right CVA tenderness, left CVA tenderness, guarding or rebound.     Comments: Soft, nontender, no rebound or guarding, negative CVA tap bilaterally  Musculoskeletal:        General: Normal range of motion.     Cervical back: Normal range of motion.     Right lower leg: No edema.     Left lower leg: No edema.  Skin:    General: Skin is warm and dry.     Capillary Refill: Capillary refill takes less than 2 seconds.  Neurological:     General: No focal deficit present.     Mental Status: She is alert.  Psychiatric:        Mood and Affect: Mood normal.     ED Results / Procedures / Treatments   Labs (all labs ordered are listed, but only abnormal results are displayed) Labs Reviewed  URINALYSIS, ROUTINE W REFLEX MICROSCOPIC - Abnormal; Notable for the following components:      Result Value   APPearance CLOUDY (*)    Glucose, UA >=500 (*)    Hgb urine dipstick MODERATE (*)    Protein, ur >=300 (*)    All other components within normal limits  COMPREHENSIVE METABOLIC PANEL - Abnormal; Notable for the following components:   Glucose,  Bld 271 (*)    Creatinine, Ser 1.17 (*)    Calcium 8.6 (*)    GFR, Estimated 49 (*)    All other components within normal limits  URINALYSIS, MICROSCOPIC (REFLEX) - Abnormal; Notable for the following components:   Bacteria, UA FEW (*)    All other components within normal limits  CBC  LIPASE, BLOOD    EKG None  Radiology CT Renal Stone Study  Result Date: 07/15/2022 CLINICAL DATA:  Left flank pain EXAM: CT ABDOMEN AND PELVIS WITHOUT CONTRAST TECHNIQUE: Multidetector CT imaging of the abdomen and pelvis was performed following the standard protocol without IV contrast. RADIATION DOSE REDUCTION:  This exam was performed according to the departmental dose-optimization program which includes automated exposure control, adjustment of the mA and/or kV according to patient size and/or use of iterative reconstruction technique. COMPARISON:  CT abdomen and pelvis 01/18/2019 FINDINGS: Lower chest: No acute abnormality. Hepatobiliary: No focal liver abnormality is seen. No gallstones, gallbladder wall thickening, or biliary dilatation. Pancreas: Unremarkable. No pancreatic ductal dilatation or surrounding inflammatory changes. Spleen: Normal in size without focal abnormality. Adrenals/Urinary Tract: There is a 2 mm calculus in the proximal right ureter. There is mild-to-moderate left-sided hydronephrosis. There is an additional 3 mm lower pole calculus in the left kidney. The right kidney, adrenal glands, and bladder are within normal limits. Stomach/Bowel: Stomach is within normal limits. Appendix appears normal. No evidence of bowel wall thickening, distention, or inflammatory changes. There are scattered colonic diverticula. Vascular/Lymphatic: Aortic atherosclerosis. No enlarged abdominal or pelvic lymph nodes. Reproductive: Uterus and bilateral adnexa are unremarkable. Other: There is a small fat containing umbilical hernia. There is no ascites. Musculoskeletal: There are degenerative changes of the lower  lumbar spine and hips. IMPRESSION: 1. 2 mm calculus in the proximal left ureter with mild-to-moderate obstructive uropathy. 2. Additional nonobstructing left renal calculus. Aortic Atherosclerosis (ICD10-I70.0). Electronically Signed   By: Darliss Cheney M.D.   On: 07/15/2022 22:56    Procedures Procedures    Medications Ordered in ED Medications  sodium chloride 0.9 % bolus 1,000 mL (0 mLs Intravenous Stopped 07/15/22 2324)  ondansetron (ZOFRAN) injection 4 mg (4 mg Intravenous Given 07/15/22 2218)  morphine (PF) 4 MG/ML injection 4 mg (4 mg Intravenous Given 07/15/22 2218)  ketorolac (TORADOL) 30 MG/ML injection 15 mg (15 mg Intravenous Given 07/15/22 2308)    ED Course/ Medical Decision Making/ A&P   72 year old here for evaluation of left flank pain which began 1 hour PTA.  Feels consistent with her prior kidney stones.  No dysuria, hematuria, fever, emesis.  No overlying skin changes to suggest shingles.  Her abdomen is soft, nontender.  No clinical evidence of VTE on exam.  No history of AAA, dissection.  Will plan on labs and imaging  Labs and imaging personally viewed and interpreted:  CBC without leukocytosis Metabolic panel glucose 271, creatinine 1.17, similar to prior.  Anion gap 8, bicarb 22, no evidence of DKA, HHS Lipase 35 CT stone study shows 2 mm left ureter stone  Patient reassessed.  Pain controlled.  Will give Toradol.  I discussed her labs and imaging.  She has no evidence of infected stone, creatinine appears to be at baseline, pain controlled, tolerating p.o. intake.  Will DC home with symptomatic management, return for new or worsening symptoms.  Patient agreeable.  On repeat exam patient does not have a surgical abdomin and there are no peritoneal signs.  No indication of appendicitis, bowel obstruction, bowel perforation, cholecystitis, diverticulitis, AAA, dissection, infected kidney stone.  Patient discharged home with symptomatic treatment and given strict instructions  for follow-up with their primary care physician.  I have also discussed reasons to return immediately to the ER.  Patient expresses understanding and agrees with plan.                              Medical Decision Making Amount and/or Complexity of Data Reviewed Independent Historian: spouse External Data Reviewed: labs, radiology and notes. Labs: ordered. Decision-making details documented in ED Course. Radiology: ordered and independent interpretation performed. Decision-making details documented in ED Course.  Risk OTC  drugs. Prescription drug management. Parenteral controlled substances. Decision regarding hospitalization. Diagnosis or treatment significantly limited by social determinants of health.          Final Clinical Impression(s) / ED Diagnoses Final diagnoses:  Kidney stone    Rx / DC Orders ED Discharge Orders          Ordered    oxyCODONE-acetaminophen (PERCOCET/ROXICET) 5-325 MG tablet  Every 6 hours PRN        07/15/22 2344    ondansetron (ZOFRAN-ODT) 4 MG disintegrating tablet  Every 8 hours PRN        07/15/22 2344    tamsulosin (FLOMAX) 0.4 MG CAPS capsule  Daily        07/15/22 2344              Mattalyn Anderegg A, PA-C 07/15/22 2349    Vanetta Mulders, MD 07/16/22 1514

## 2022-07-15 NOTE — ED Triage Notes (Signed)
Pt states that she started having left flank pain about 2000, sudden onset, hx of kidney stones, reports nausea, denies vomiting; Took Tylenol for pain

## 2022-08-10 IMAGING — MR MR HEAD W/O CM
10 series · 48 of 48 positions shown · non-contrast
Comparison: Prior head CT examinations 04/13/2021 and earlier.
Brain MRI 09/12/2020.

CLINICAL DATA: Provided history: Stroke, follow-up. Additional
history provided: Headache and vertigo for 2 days. History of prior
CVA.

EXAM:
MRI HEAD WITHOUT CONTRAST
TECHNIQUE: Multiplanar, multiecho pulse sequences of the brain and surrounding
structures were obtained without intravenous contrast.

[Series 2: T1 · sagittal · 5.0mm · 0.86mm/px · 1 of 27 slices shown]
[im 1/27]
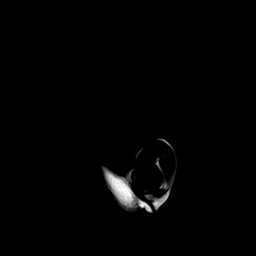

[Series 3: DWI · axial · 3.0mm · 1.80mm/px · z∈[-62,+92]mm · 8 of 94 slices shown (1 of 4)]
[im 1/94]
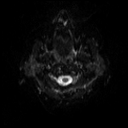
[im 14/94]
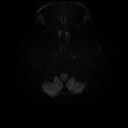
[im 27/94]
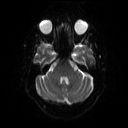
[im 40/94]
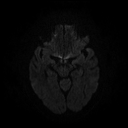
[im 54/94]
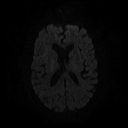
[im 67/94]
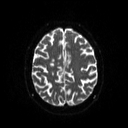
[im 80/94]
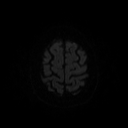
[im 94/94]
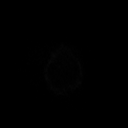

[Series 4: DWI · axial · 3.0mm · 1.80mm/px · z∈[-62,+92]mm · 4 of 48 slices shown (2 of 4)]
[im 1/48]
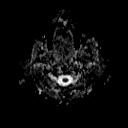
[im 16/48]
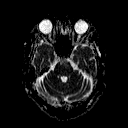
[im 32/48]
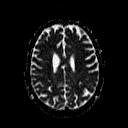
[im 48/48]
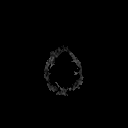

[Series 5: DWI · coronal · 3.0mm · 1.80mm/px · 9 of 96 slices shown (3 of 4)]
[im 1/96]
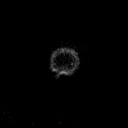
[im 12/96]
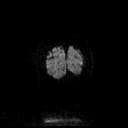
[im 24/96]
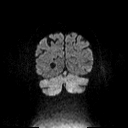
[im 36/96]
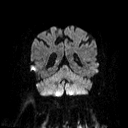
[im 48/96]
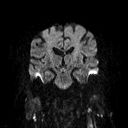
[im 60/96]
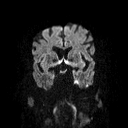
[im 72/96]
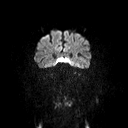
[im 84/96]
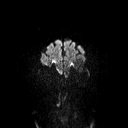
[im 96/96]
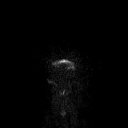

[Series 6: DWI · coronal · 3.0mm · 1.80mm/px · 4 of 48 slices shown (4 of 4)]
[im 1/48]
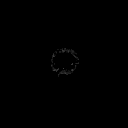
[im 16/48]
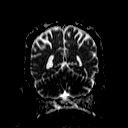
[im 32/48]
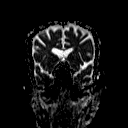
[im 48/48]
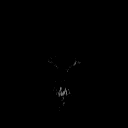

[Series 7: T2 · axial · 5.0mm · 0.66mm/px · z∈[-66,+95]mm · 3 of 28 slices shown (1 of 3)]
[im 1/28]
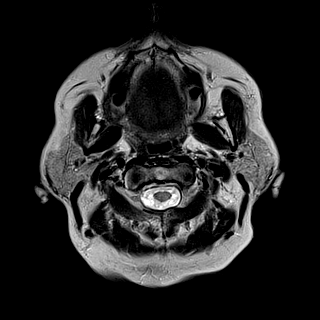
[im 14/28]
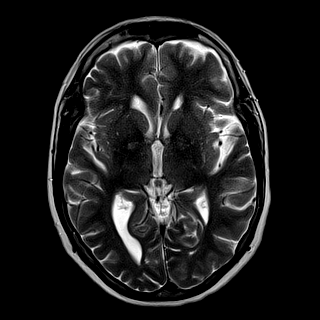
[im 28/28]
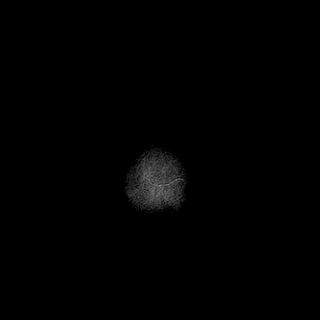

[Series 8: T2 · axial · 5.0mm · 0.41mm/px · z∈[-66,+95]mm · 3 of 28 slices shown (2 of 3)]
[im 1/28]
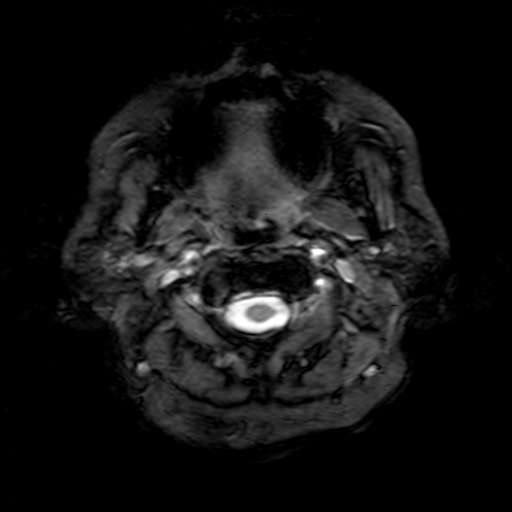
[im 14/28]
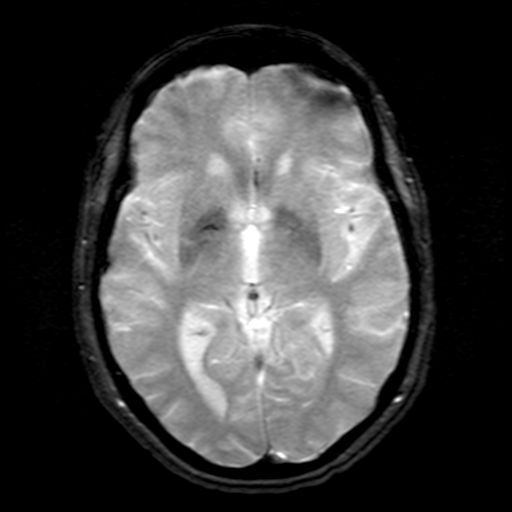
[im 28/28]
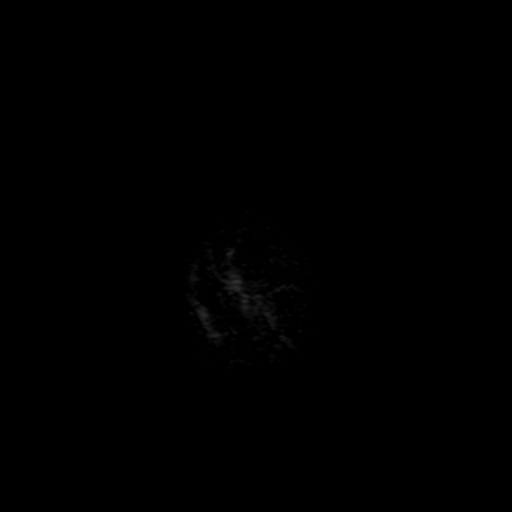

[Series 9: FLAIR · axial · 3.0mm · 0.41mm/px · z∈[-66,+96]mm · 4 of 42 slices shown]
[im 1/42]
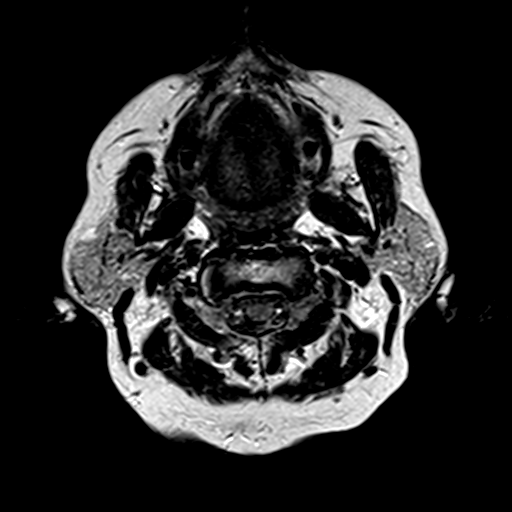
[im 14/42]
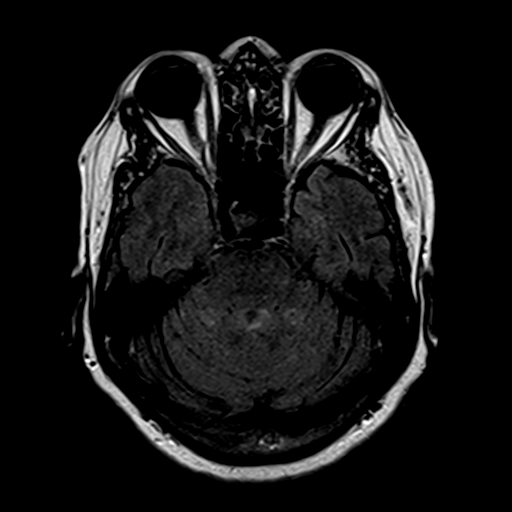
[im 28/42]
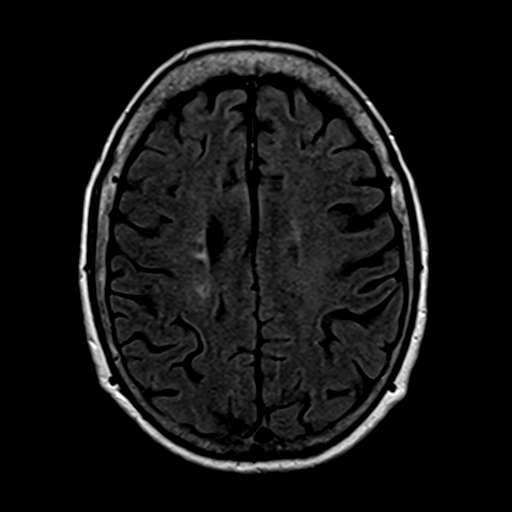
[im 42/42]
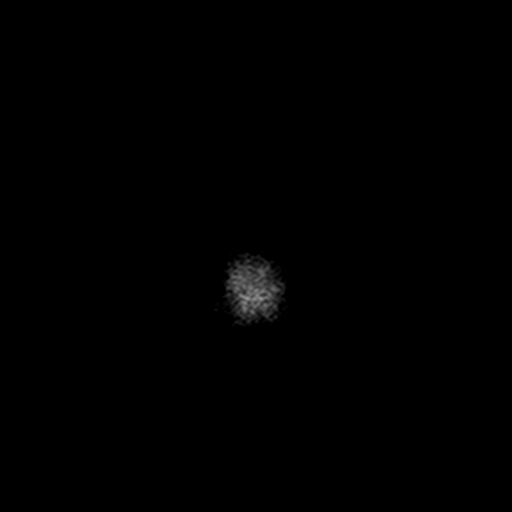

[Series 10: t1_3d_tra · axial · 2.0mm · 0.86mm/px · z∈[-75,+113]mm · 9 of 96 slices shown]
[im 1/96]
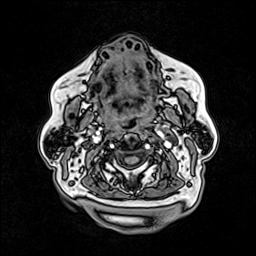
[im 12/96]
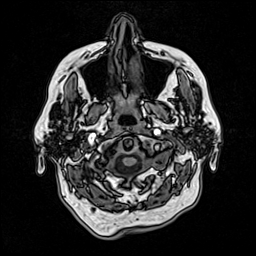
[im 24/96]
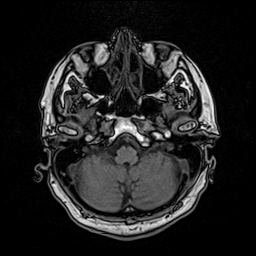
[im 36/96]
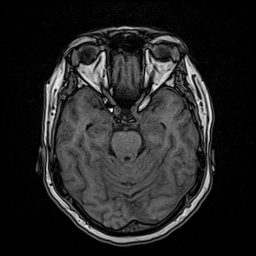
[im 48/96]
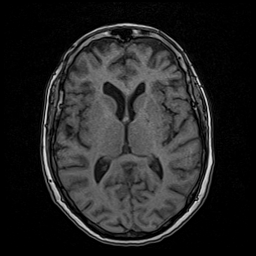
[im 60/96]
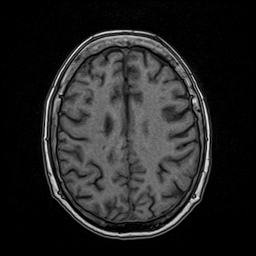
[im 72/96]
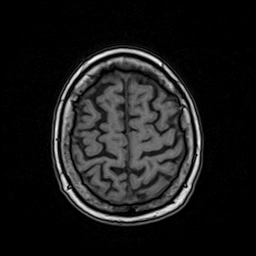
[im 84/96]
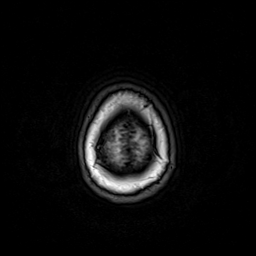
[im 96/96]
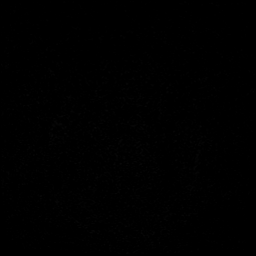

[Series 11: T2 · coronal · 5.0mm · 0.62mm/px · 3 of 30 slices shown (3 of 3)]
[im 1/30]
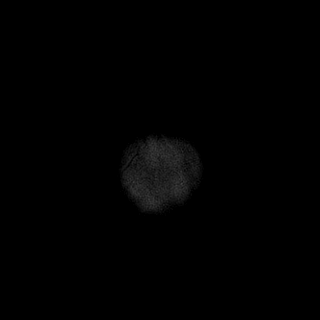
[im 15/30]
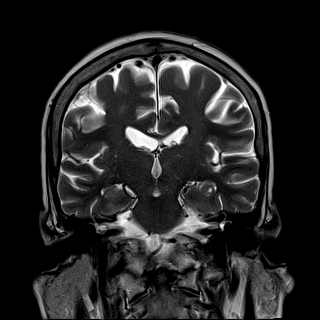
[im 30/30]
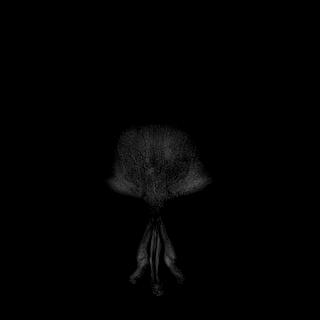

[48 of 48 positions shown; findings below may reference images not displayed]

FINDINGS: Brain:

No age advanced or lobar predominant cerebral atrophy.

Redemonstration of small chronic cortical/subcortical infarcts
within the right frontal and bilateral parietal lobes.

Redemonstration of multiple small chronic infarcts within the right
centrum semiovale/corona radiata.

Background mild multifocal T2 FLAIR hyperintense signal abnormality
within the cerebral white matter, nonspecific but compatible chronic
small vessel ischemic disease.

There is no acute infarct.

No evidence of an intracranial mass.

No chronic intracranial blood products.

No extra-axial fluid collection.

No midline shift.

Vascular: Maintained flow voids within the proximal large arterial
vessels.

Skull and upper cervical spine: No focal suspicious marrow lesion.

Sinuses/Orbits: Visualized orbits show no acute finding. Bilateral
ocular lens replacements. Minimal mucosal thickening within the
bilateral ethmoid sinuses.
IMPRESSION: No evidence of acute intracranial abnormality.

Redemonstration of small chronic cortical/subcortical infarcts
within the right frontal and bilateral parietal lobes.

Redemonstration of multiple small chronic infarcts within the right
centrum semiovale/corona radiata.

Background mild chronic small vessel ischemic changes within the
cerebral white matter.

Minimal mucosal thickening within the bilateral ethmoid sinuses.

## 2022-08-10 IMAGING — CT CT HEAD W/O CM
3 series · 15 of 47 positions shown, 18 images · non-contrast
Comparison: Head CT 09/12/2020.

CLINICAL DATA: 70-year-old female with history of dizziness for the
past 2 nights.



[Series 2: head wo · axial · 0.42mm/px · z∈[+59,+184]mm · 9 of 31 slices shown, 12 images]
[im 3/31  brain]
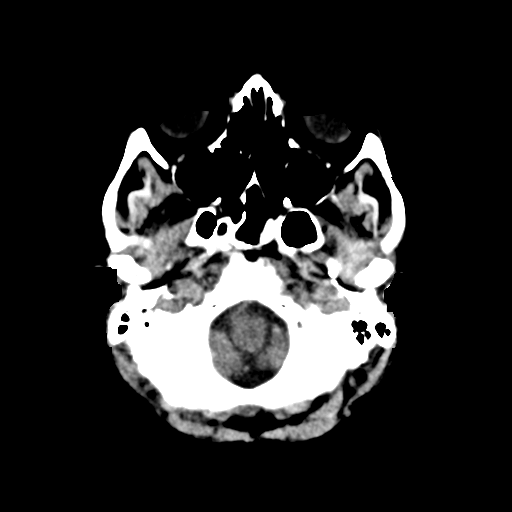
[im 3/31  bone]
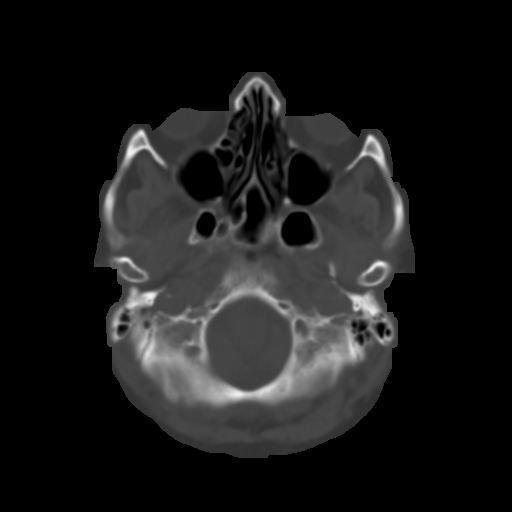
[im 6/31  brain]
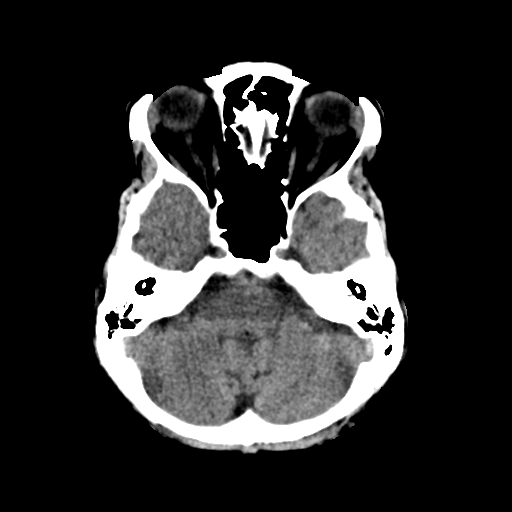
[im 9/31  brain]
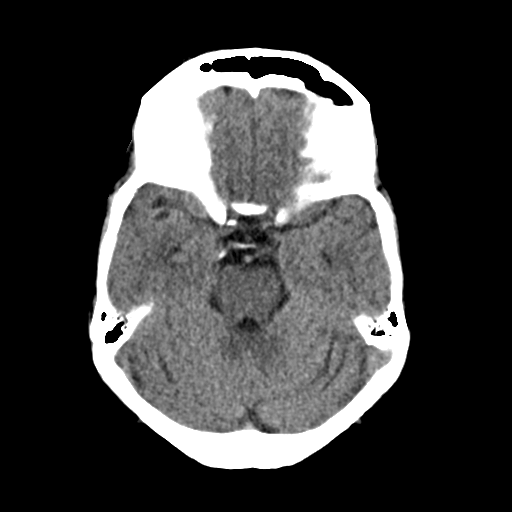
[im 12/31  brain]
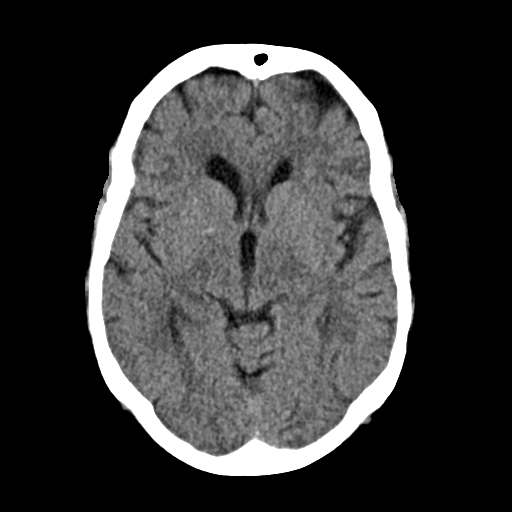
[im 16/31  brain]
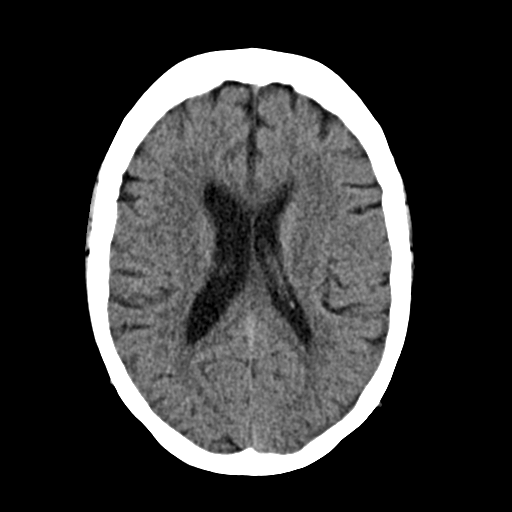
[im 16/31  bone]
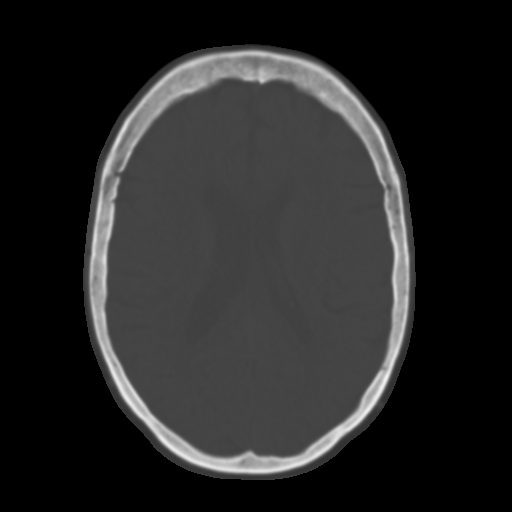
[im 19/31  brain]
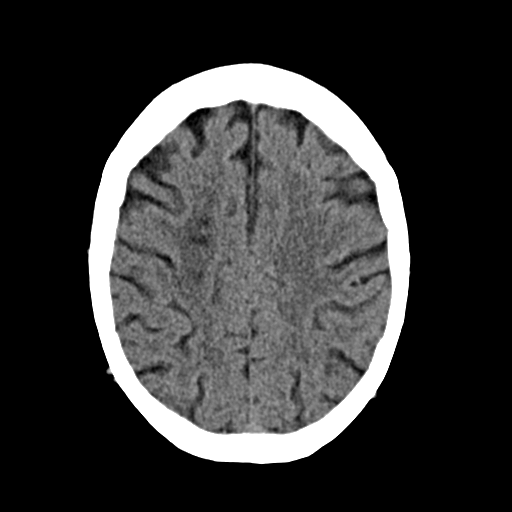
[im 22/31  brain]
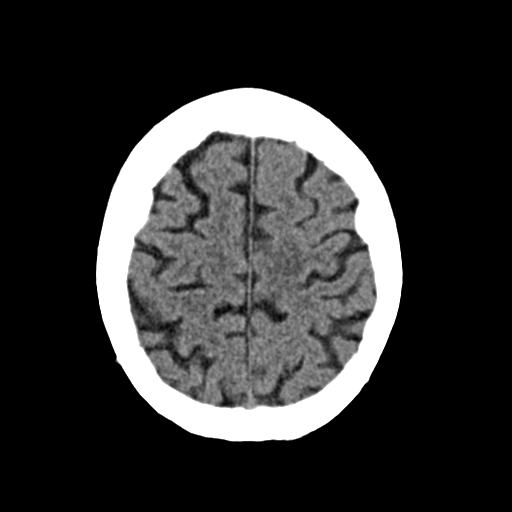
[im 25/31  brain]
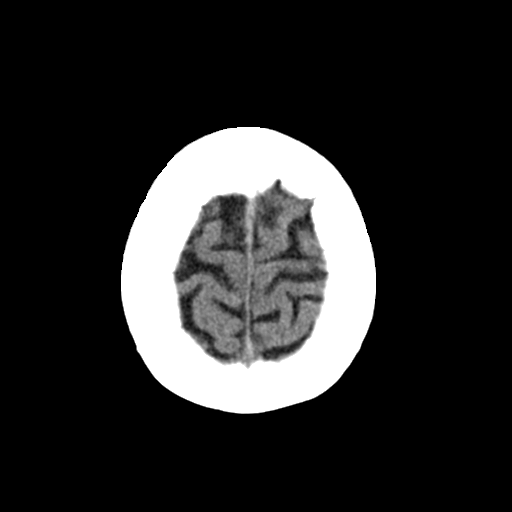
[im 28/31  brain]
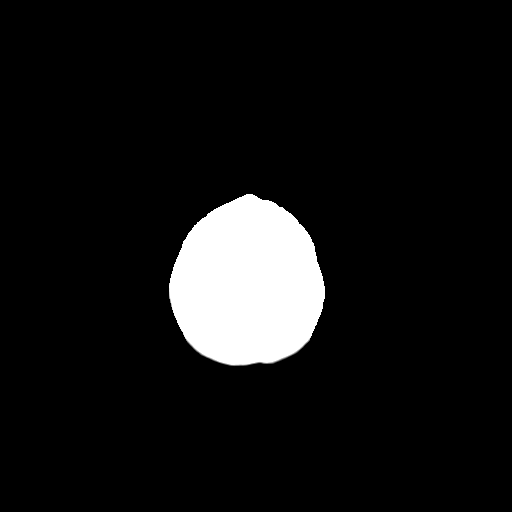
[im 28/31  bone]
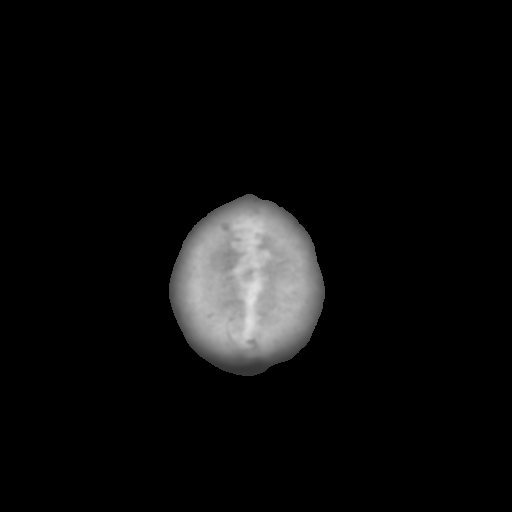

[Series 4: coronal soft · coronal · 0.29mm/px · 3 of 66 slices shown]
[im 22/66  brain]
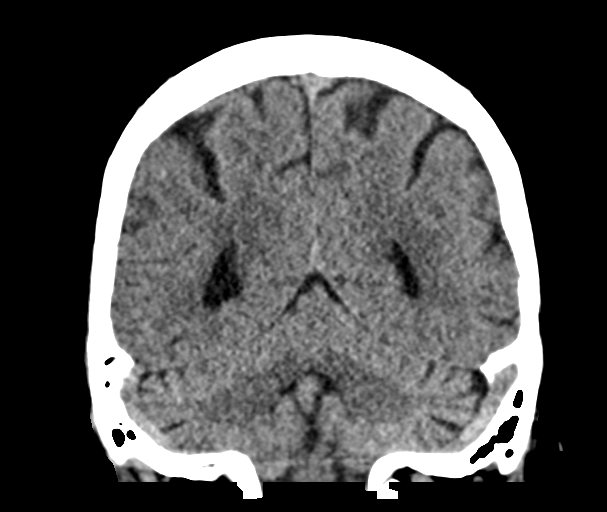
[im 29/66  brain]
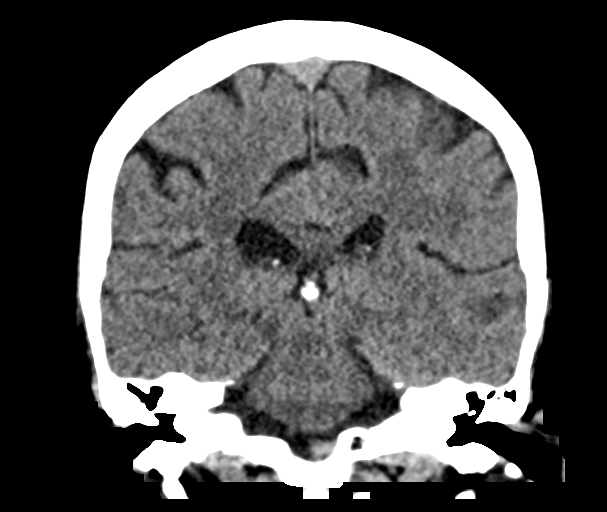
[im 37/66  brain]
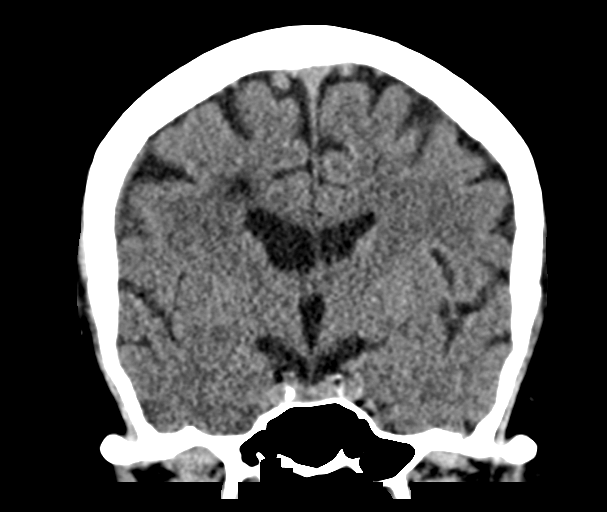

[Series 5: sag soft · sagittal · 0.30mm/px · 3 of 53 slices shown]
[im 18/53  brain]
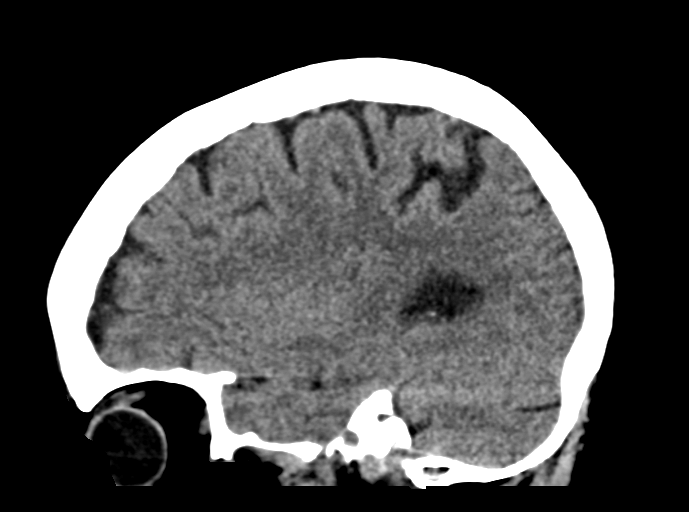
[im 27/53  brain]
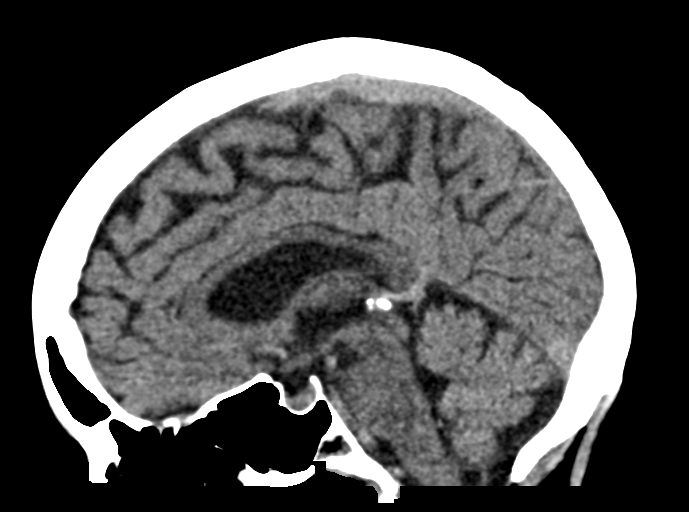
[im 35/53  brain]
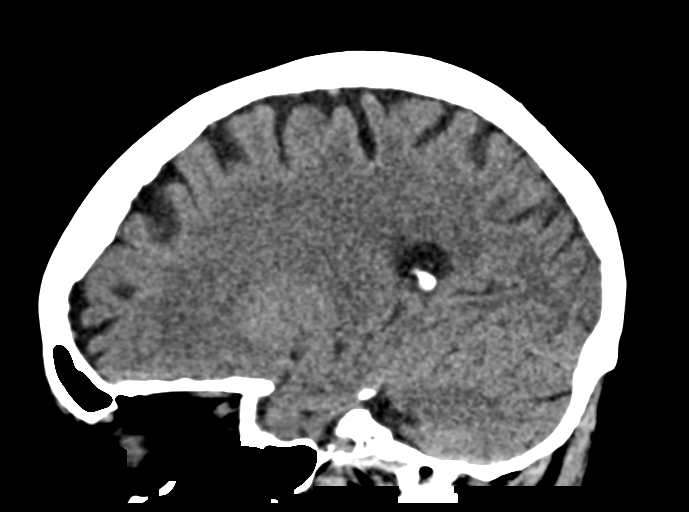

[15 of 47 positions shown; findings below may reference images not displayed]

FINDINGS: Brain: Mild cerebral atrophy. Patchy and confluent areas of
decreased attenuation are noted throughout the deep and
periventricular white matter of the cerebral hemispheres
bilaterally, compatible with chronic microvascular ischemic disease.
No evidence of acute infarction, hemorrhage, hydrocephalus,
extra-axial collection or mass lesion/mass effect.

Vascular: No hyperdense vessel or unexpected calcification.

Skull: Normal. Negative for fracture or focal lesion.

Sinuses/Orbits: No acute finding.

Other: None.
IMPRESSION: 1. No acute intracranial abnormalities.
2. Mild cerebral atrophy with mild chronic microvascular ischemic
changes in the cerebral white matter, as above.

## 2022-09-01 ENCOUNTER — Encounter: Payer: Self-pay | Admitting: Podiatry

## 2022-09-01 ENCOUNTER — Ambulatory Visit: Payer: Medicare HMO | Admitting: Podiatry

## 2022-09-01 DIAGNOSIS — Z794 Long term (current) use of insulin: Secondary | ICD-10-CM

## 2022-09-01 DIAGNOSIS — B351 Tinea unguium: Secondary | ICD-10-CM

## 2022-09-01 DIAGNOSIS — M79675 Pain in left toe(s): Secondary | ICD-10-CM

## 2022-09-01 DIAGNOSIS — M19072 Primary osteoarthritis, left ankle and foot: Secondary | ICD-10-CM

## 2022-09-01 DIAGNOSIS — E1142 Type 2 diabetes mellitus with diabetic polyneuropathy: Secondary | ICD-10-CM

## 2022-09-01 DIAGNOSIS — M79674 Pain in right toe(s): Secondary | ICD-10-CM
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
## 2022-09-05 ENCOUNTER — Ambulatory Visit
Admission: EM | Admit: 2022-09-05 | Discharge: 2022-09-05 | Disposition: A | Discharge status: Home / Self Care | Payer: Medicare HMO | Attending: Emergency Medicine | Admitting: Emergency Medicine

## 2022-09-05 DIAGNOSIS — H66002 Acute suppurative otitis media without spontaneous rupture of ear drum, left ear: Secondary | ICD-10-CM

## 2022-09-05 DIAGNOSIS — H60543 Acute eczematoid otitis externa, bilateral: Secondary | ICD-10-CM | POA: Diagnosis not present

## 2022-09-05 MED ORDER — CEFDINIR 300 MG PO CAPS: 300.0000 mg | ORAL_CAPSULE | Freq: Two times a day (BID) | ORAL | 0 refills | Status: AC

## 2022-09-05 MED ORDER — FLUOCINOLONE ACETONIDE 0.01 % OT OIL: 5.0000 [drp] | TOPICAL_OIL | Freq: Two times a day (BID) | OTIC | 0 refills | Status: DC | PRN

## 2022-09-05 NOTE — ED Triage Notes (Signed)
Pt presents with c/o lt ear ache and throbbing x 2 days.
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
## 2022-09-05 NOTE — Discharge Instructions (Signed)
Please read below to learn more about the medications, dosages and frequencies that I recommend to help alleviate your symptoms and to get you feeling better soon:   Omnicef (cefdinir):  Please take one (1) dose twice daily for 10 days.  This antibiotic can cause upset stomach, this will resolve once antibiotics are complete.  You are welcome to take a probiotic, eat yogurt, take Imodium while taking this medication.  Please avoid other systemic medications such as Maalox, Pepto-Bismol or milk of magnesia as they can interfere with the body's ability to absorb the antibiotics.   DermOtic oil (fluocinolone): This is a topical steroid oil that you can use in your ear twice daily to alleviate pain, itching and swelling.   If symptoms have not meaningfully improved in the next 7 to 10 days, please return for repeat evaluation or follow-up with your regular provider.  If symptoms have worsened in the next 3 to 5 days, please return for repeat evaluation or follow-up with your regular provider.    Thank you for visiting urgent care today.  We appreciate the opportunity to participate in your care.

## 2022-09-10 ENCOUNTER — Ambulatory Visit (INDEPENDENT_AMBULATORY_CARE_PROVIDER_SITE_OTHER): Payer: Medicare HMO | Admitting: Family

## 2022-09-10 VITALS — BP 117/56 | HR 68 | Temp 98.5°F | Resp 16 | Wt 179.0 lb

## 2022-09-10 DIAGNOSIS — M810 Age-related osteoporosis without current pathological fracture: Secondary | ICD-10-CM | POA: Diagnosis not present

## 2022-09-10 DIAGNOSIS — H6692 Otitis media, unspecified, left ear: Secondary | ICD-10-CM

## 2022-09-10 DIAGNOSIS — H6690 Otitis media, unspecified, unspecified ear: Secondary | ICD-10-CM

## 2022-09-10 MED ORDER — ALENDRONATE SODIUM 70 MG PO TABS: 70.0000 mg | ORAL_TABLET | ORAL | 4 refills | Status: DC

## 2022-09-10 MED ORDER — LEVOFLOXACIN 500 MG PO TABS
500.0000 mg | ORAL_TABLET | Freq: Every day | ORAL | 0 refills | Status: AC
Start: 2022-09-10 — End: 2022-09-15

## 2022-09-10 NOTE — Assessment & Plan Note (Signed)
>>  ASSESSMENT AND PLAN FOR ACUTE OTITIS MEDIA WRITTEN ON 09/10/2022 11:35 AM BY O'SULLIVAN, Nihal Marzella, NP  Persistent left ear pain despite 3.5 days of Cefdinir and Fluocinolone ear drops. No significant improvement noted. Eardrum examination shows mild redness and fluid behind the eardrum. -Discontinue Cefdinir. -Start Levaquin for 5 days. -Continue Fluocinolone ear drops. -Contact office if symptoms do not improve.

## 2022-09-10 NOTE — Patient Instructions (Signed)
VISIT SUMMARY:  During your visit, we discussed your ongoing left ear pain and vertigo, your concerns about Metformin and its potential effects on bone health, and your family history of osteoporosis and diabetes. We also reviewed your recent bone density scan results.  YOUR PLAN:  -EAR INFECTION: Your ear infection has not improved significantly with the current treatment. We will stop the Cefdinir and start you on Levaquin for 5 days. Please continue using the Fluocinolone ear drops and contact the office if your symptoms do not improve.  -BONE HEALTH: Your bone density scan showed some thinning, which is a condition called osteopenia. This is not as severe as osteoporosis, but we will start you on Fosamax once a week to help strengthen your bones. Please continue your calcium supplement and regular weight-bearing exercise.  INSTRUCTIONS:  Please start taking Levaquin for your ear infection and Fosamax for your bone health. Continue using the Fluocinolone ear drops and taking your Metformin as prescribed. Remember to remain upright for at least 30 minutes after taking Fosamax. If your ear pain does not improve, please contact the office.

## 2022-09-10 NOTE — Assessment & Plan Note (Signed)
Persistent left ear pain despite 3.5 days of Cefdinir and Fluocinolone ear drops. No significant improvement noted. Eardrum examination shows mild redness and fluid behind the eardrum. -Discontinue Cefdinir. -Start Levaquin for 5 days. -Continue Fluocinolone ear drops. -Contact office if symptoms do not improve.
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
## 2022-09-10 NOTE — Assessment & Plan Note (Signed)
Patient reports family history of osteoporosis and diabetes. Recent bone density scan showed some bone thinning, with a 10-year fracture risk of 12.7% and hip fracture risk of 2.9%. -Start Fosamax once weekly in the morning. -Continue calcium supplement and regular weight-bearing exercise. -Review medication instructions (remain upright for at least 30 minutes after taking).

## 2022-10-07 ENCOUNTER — Other Ambulatory Visit: Payer: Self-pay | Admitting: Family

## 2022-10-08 ENCOUNTER — Telehealth: Payer: Self-pay | Admitting: Family

## 2022-10-08 ENCOUNTER — Other Ambulatory Visit: Payer: Self-pay

## 2022-10-08 MED ORDER — LOSARTAN POTASSIUM 50 MG PO TABS: 50.0000 mg | ORAL_TABLET | Freq: Every day | ORAL | 1 refills | Status: DC

## 2022-10-08 NOTE — Telephone Encounter (Signed)
Pt requesting Losartan refill. Please send to [facility] on [address] in [place]

## 2022-10-08 NOTE — Telephone Encounter (Signed)
Rx sent 

## 2022-10-17 ENCOUNTER — Other Ambulatory Visit: Payer: Self-pay | Admitting: Family

## 2022-10-21 ENCOUNTER — Encounter: Payer: Self-pay | Admitting: Internal Medicine

## 2022-10-21 ENCOUNTER — Ambulatory Visit: Payer: Medicare HMO | Admitting: Internal Medicine

## 2022-10-21 ENCOUNTER — Telehealth: Payer: Self-pay

## 2022-10-21 VITALS — BP 124/80 | HR 79 | Ht 65.0 in | Wt 177.2 lb

## 2022-10-21 DIAGNOSIS — E1165 Type 2 diabetes mellitus with hyperglycemia: Secondary | ICD-10-CM | POA: Diagnosis not present

## 2022-10-21 DIAGNOSIS — E1159 Type 2 diabetes mellitus with other circulatory complications: Secondary | ICD-10-CM

## 2022-10-21 DIAGNOSIS — Z794 Long term (current) use of insulin: Secondary | ICD-10-CM

## 2022-10-21 DIAGNOSIS — E1142 Type 2 diabetes mellitus with diabetic polyneuropathy: Secondary | ICD-10-CM | POA: Diagnosis not present

## 2022-10-21 DIAGNOSIS — Z7984 Long term (current) use of oral hypoglycemic drugs: Secondary | ICD-10-CM | POA: Diagnosis not present

## 2022-10-21 LAB — POCT GLYCOSYLATED HEMOGLOBIN (HGB A1C): Hemoglobin A1C: 7.4 % — AB (ref 4.0–5.6)

## 2022-10-21 NOTE — Telephone Encounter (Signed)
Medication Samples have been provided to the patient.  Drug name: Ozempic       Strength: 0.25mg         Qty: 1  LOT: [id number]  Exp.Date: 12/11/2023  Dosing instructions: INJECT 0.25MG  WEEKLY  The patient has been instructed regarding the correct time, dose, and frequency of taking this medication, including desired effects and most common side effects.   Saxon Crosby L Aloise Copus 10:31 AM 10/21/2022
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
## 2022-10-21 NOTE — Patient Instructions (Addendum)
-   Start Ozempic 0.25 mg weekly for 6 weeks, than increase to 0.5 mg weekly  - Continue Metformin 1000 mg , 1 tablet Twice a day  - Decrease Lantus/Levemir  to  20 units daily    HOW TO TREAT LOW BLOOD SUGARS (Blood sugar LESS THAN 70 MG/DL) Please follow the RULE OF 15 for the treatment of hypoglycemia treatment (when your (blood sugars are less than 70 mg/dL)   STEP 1: Take 15 grams of carbohydrates when your blood sugar is low, which includes:  3-4 GLUCOSE TABS  OR 3-4 OZ OF JUICE OR REGULAR SODA OR ONE TUBE OF GLUCOSE GEL    STEP 2: RECHECK blood sugar in 15 MINUTES STEP 3: If your blood sugar is still low at the 15 minute recheck --> then, go back to STEP 1 and treat AGAIN with another 15 grams of carbohydrates.

## 2022-11-15 ENCOUNTER — Emergency Department (HOSPITAL_BASED_OUTPATIENT_CLINIC_OR_DEPARTMENT_OTHER)
Admission: EM | Admit: 2022-11-15 | Discharge: 2022-11-15 | Disposition: A | Discharge status: Home / Self Care | Payer: Medicare HMO | Attending: Emergency Medicine | Admitting: Emergency Medicine

## 2022-11-15 ENCOUNTER — Encounter (HOSPITAL_BASED_OUTPATIENT_CLINIC_OR_DEPARTMENT_OTHER): Payer: Self-pay | Admitting: Emergency Medicine

## 2022-11-15 ENCOUNTER — Other Ambulatory Visit: Payer: Self-pay

## 2022-11-15 ENCOUNTER — Emergency Department (HOSPITAL_BASED_OUTPATIENT_CLINIC_OR_DEPARTMENT_OTHER): Payer: Medicare HMO

## 2022-11-15 DIAGNOSIS — M1712 Unilateral primary osteoarthritis, left knee: Secondary | ICD-10-CM | POA: Diagnosis not present

## 2022-11-15 DIAGNOSIS — S838X2A Sprain of other specified parts of left knee, initial encounter: Secondary | ICD-10-CM | POA: Diagnosis not present

## 2022-11-15 DIAGNOSIS — W010XXA Fall on same level from slipping, tripping and stumbling without subsequent striking against object, initial encounter: Secondary | ICD-10-CM | POA: Diagnosis not present

## 2022-11-15 DIAGNOSIS — M25562 Pain in left knee: Secondary | ICD-10-CM | POA: Diagnosis not present

## 2022-11-15 DIAGNOSIS — W19XXXA Unspecified fall, initial encounter: Secondary | ICD-10-CM

## 2022-11-15 DIAGNOSIS — M25462 Effusion, left knee: Secondary | ICD-10-CM | POA: Diagnosis not present

## 2022-11-15 DIAGNOSIS — M85862 Other specified disorders of bone density and structure, left lower leg: Secondary | ICD-10-CM | POA: Diagnosis not present

## 2022-11-15 NOTE — ED Provider Notes (Signed)
[place] [facility] AT [facility] Provider Note   CSN: [id number] Arrival date & time: 11/15/22  1354     History {Add pertinent medical, surgical, social history, OB history to HPI:1} Chief Complaint  Patient presents with   Jacqueline [name] is a 72 y.o. female.  She is here for evaluation of left knee pain.  She said she was walking yesterday and tripped landing on her knees.  Left knee hurts worse than right.  She is able to ambulate but is very sore.  No numbness or weakness.  They are traveling to [place] on Tuesday and wanted to get it evaluated.  The history is provided by the patient and the spouse.  Knee Pain Location:  Knee Time since incident:  1 day Injury: yes   Mechanism of injury: fall   Fall:    Fall occurred:  Walking and tripped Knee location:  L knee Pain details:    Onset quality:  Sudden   Progression:  Unchanged Chronicity:  New Dislocation: no   Relieved by:  Nothing Worsened by:  Bearing weight Ineffective treatments:  Ice, rest and elevation Associated symptoms: no fever, no neck pain, no numbness and no tingling        Home Medications Prior to Admission medications   Medication Sig Start Date End Date Taking? Authorizing Provider  alendronate (FOSAMAX) 70 MG tablet Take 1 tablet (70 mg total) by mouth every 7 (seven) days. Take with a full glass of water on an empty stomach. 09/10/22   Sandford Craze, NP  Cholecalciferol (VITAMIN D3) 75 MCG (3000 UT) TABS Take 1 tablet by mouth daily at 6 (six) AM. 04/16/22   Sandford Craze, NP  clopidogrel (PLAVIX) 75 MG tablet TAKE 1 TABLET BY MOUTH EVERY DAY 05/18/22   Worthy Rancher B, FNP  Fluocinolone Acetonide (DERMOTIC) 0.01 % OIL Place 5 drops in ear(s) 2 (two) times daily as needed (Itching in ears). 09/05/22   Theadora Rama Scales, PA-C  Gabapentin 10 % CREA Apply 1 Application topically at bedtime. 04/11/22   Shamleffer, Konrad Dolores, MD  glucose blood (ACCU-CHEK  GUIDE) test strip USE AS INSTRUCTED TO TEST BLOOD SUGARS 3 TIMES DAILY E11.65 06/24/22   Shamleffer, Konrad Dolores, MD  insulin detemir (LEVEMIR FLEXPEN) 100 UNIT/ML FlexPen Inject 20 Units into the skin daily. 10/28/21   Shamleffer, Konrad Dolores, MD  insulin glargine (LANTUS) 100 UNIT/ML injection INJECT 20 UNITS INTO THE SKIN DAILY 01/14/22   Shamleffer, Konrad Dolores, MD  Insulin Pen Needle (BD PEN NEEDLE NANO 2ND GEN) 32G X 4 MM MISC Inject 1 Device into the skin daily in the afternoon. USE DAILY AS DIRECTED 04/29/21   Shamleffer, Konrad Dolores, MD  Lancets (ACCU-CHEK MULTICLIX) lancets Use as instructed to test blood sugars 3 times daily E11.65 10/04/18   Shamleffer, Konrad Dolores, MD  losartan (COZAAR) 50 MG tablet Take 1 tablet (50 mg total) by mouth daily. 10/08/22   Sandford Craze, NP  metFORMIN (GLUCOPHAGE) 1000 MG tablet Take 1 tablet (1,000 mg total) by mouth 2 (two) times daily with a meal. 02/12/22   Sandford Craze, NP  neomycin-polymyxin-hydrocortisone (CORTISPORIN) OTIC solution Place 3 drops into the right ear 4 (four) times daily. 02/05/22   Saguier, Ramon Dredge, PA-C  ondansetron (ZOFRAN-ODT) 4 MG disintegrating tablet Take 1 tablet (4 mg total) by mouth every 8 (eight) hours as needed for nausea or vomiting. 07/15/22   Henderly, Britni A, PA-C  oxyCODONE-acetaminophen (PERCOCET/ROXICET) 5-325 MG tablet Take 1  tablet by mouth every 6 (six) hours as needed for severe pain. Patient not taking: Reported on 10/21/2022 07/15/22   Henderly, Britni A, PA-C  pantoprazole (PROTONIX) 20 MG tablet Take 1 tablet (20 mg total) by mouth daily. 05/30/22   Sandford Craze, NP  rosuvastatin (CRESTOR) 40 MG tablet TAKE 1 TABLET BY MOUTH EVERYDAY AT BEDTIME 10/19/22   Sandford Craze, NP  topiramate (TOPAMAX) 50 MG tablet Take 1 tablet (50 mg total) by mouth 2 (two) times daily. 04/09/22   Ihor Austin, NP      Allergies    Augmentin [amoxicillin-pot clavulanate], Definity [perflutren lipid  microsphere], Novocain [procaine], Promethazine, and Prozac [fluoxetine hcl]    Review of Systems   Review of Systems  Constitutional:  Negative for fever.  Musculoskeletal:  Negative for neck pain.  Skin:  Negative for wound.  Neurological:  Negative for weakness and numbness.    Physical Exam Updated Vital Signs BP 125/64 (BP Location: Right Arm)   Pulse 70   Temp 98 F (36.7 C) (Oral)   Resp 18   Ht 5\' 5"  (1.651 m)   Wt 80.4 kg   SpO2 97%   BMI 29.50 kg/m  Physical Exam Constitutional:      Appearance: Normal appearance. She is well-developed.  HENT:     Head: Normocephalic and atraumatic.  Eyes:     Conjunctiva/sclera: Conjunctivae normal.  Musculoskeletal:        General: Tenderness present. No deformity.     Cervical back: Neck supple.     Comments: She is tender over her left patella.  Her extensor mechanism is intact.  There is no significant swelling or bruising.  No overlying wounds or erythema.  She has some pain on both of the joint lines.  She has pain with range of motion although has full range of motion.  Distal pulses motor and sensation intact.  Skin:    General: Skin is warm and dry.  Neurological:     Mental Status: She is alert.     GCS: GCS eye subscore is 4. GCS verbal subscore is 5. GCS motor subscore is 6.     Sensory: No sensory deficit.     Motor: No weakness.     ED Results / Procedures / Treatments   Labs (all labs ordered are listed, but only abnormal results are displayed) Labs Reviewed - No data to display  EKG None  Radiology No results found.  Procedures Procedures  {Document cardiac monitor, telemetry assessment procedure when appropriate:1}  Medications Ordered in ED Medications - No data to display  ED Course/ Medical Decision Making/ A&P   {   Click here for ABCD2, HEART and other calculatorsREFRESH Note before signing :1}                              Medical Decision Making Amount and/or Complexity of Data  Reviewed Radiology: ordered.   This patient complains of ***; this involves an extensive number of treatment Options and is a complaint that carries with it a high risk of complications and morbidity. The differential includes ***  I ordered, reviewed and interpreted labs, which included *** I ordered medication *** and reviewed PMP when indicated. I ordered imaging studies which included *** and I independently    visualized and interpreted imaging which showed *** Additional history obtained from *** Previous records obtained and reviewed *** I consulted *** and discussed lab and imaging findings  and discussed disposition.  Cardiac monitoring reviewed, *** Social determinants considered, *** Critical Interventions: ***  After the interventions stated above, I reevaluated the patient and found *** Admission and further testing considered, ***   {Document critical care time when appropriate:1} {Document review of labs and clinical decision tools ie heart score, Chads2Vasc2 etc:1}  {Document your independent review of radiology images, and any outside records:1} {Document your discussion with family members, caretakers, and with consultants:1} {Document social determinants of health affecting pt's care:1} {Document your decision making why or why not admission, treatments were needed:1} Final Clinical Impression(s) / ED Diagnoses Final diagnoses:  None    Rx / DC Orders ED Discharge Orders     None

## 2022-11-15 NOTE — ED Triage Notes (Signed)
Pt tripped yesterday; c/o pain to LT knee

## 2022-11-15 NOTE — Discharge Instructions (Signed)
You were seen in the emergency department for knee pain after a fall.  Your x-ray did not show an obvious fracture or dislocation.  The knee is likely bruised although there can be some injury to the ligament or cartilage that will not show up on x-ray.  Please continue ice and pain medications as needed.  Elevate when able.  Knee immobilizer may help stabilize the area.  Follow-up with orthopedics for further evaluation.

## 2022-11-18 ENCOUNTER — Ambulatory Visit: Payer: Medicare HMO | Admitting: Podiatry

## 2022-11-18 ENCOUNTER — Other Ambulatory Visit: Payer: Self-pay

## 2022-11-18 ENCOUNTER — Telehealth: Payer: Self-pay | Admitting: Family

## 2022-11-18 ENCOUNTER — Encounter: Payer: Self-pay | Admitting: Podiatry

## 2022-11-18 DIAGNOSIS — E1142 Type 2 diabetes mellitus with diabetic polyneuropathy: Secondary | ICD-10-CM

## 2022-11-18 DIAGNOSIS — B351 Tinea unguium: Secondary | ICD-10-CM

## 2022-11-18 DIAGNOSIS — M79674 Pain in right toe(s): Secondary | ICD-10-CM | POA: Diagnosis not present

## 2022-11-18 DIAGNOSIS — Z794 Long term (current) use of insulin: Secondary | ICD-10-CM

## 2022-11-18 DIAGNOSIS — M79675 Pain in left toe(s): Secondary | ICD-10-CM

## 2022-11-18 MED ORDER — CLOPIDOGREL BISULFATE 75 MG PO TABS: 75.0000 mg | ORAL_TABLET | Freq: Every day | ORAL | 1 refills | Status: DC

## 2022-11-18 NOTE — Telephone Encounter (Signed)
*  Pt called to request the following medication for going out of the country. Pt stated they are leaving tomorrow (10.9.24). Routed HP due to time sensitivity.*  Prescription Request  11/18/2022  Is this a "Controlled Substance" medicine? No  LOV: 09/10/2022  What is the name of the medication or equipment?   clopidogrel (PLAVIX) 75 MG tablet [[id number]]  Have you contacted your pharmacy to request a refill? Yes   Which pharmacy would you like this sent to?   [id number] - [place], [place] - [address] [place] [address] Phone: [phone] Fax: [phone]  Patient notified that their request is being sent to the clinical staff for review and that they should receive a response within 2 business days.   Please advise at Mobile [phone] (mobile)
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
## 2022-12-01 ENCOUNTER — Ambulatory Visit: Payer: Medicare HMO | Admitting: Family

## 2022-12-02 ENCOUNTER — Ambulatory Visit: Payer: Medicare HMO | Admitting: Podiatry

## 2022-12-21 ENCOUNTER — Other Ambulatory Visit: Payer: Self-pay

## 2022-12-21 ENCOUNTER — Emergency Department (HOSPITAL_BASED_OUTPATIENT_CLINIC_OR_DEPARTMENT_OTHER)
Admission: EM | Admit: 2022-12-21 | Discharge: 2022-12-21 | Disposition: A | Discharge status: Home / Self Care | Payer: Medicare HMO | Attending: Emergency Medicine | Admitting: Emergency Medicine

## 2022-12-21 ENCOUNTER — Encounter (HOSPITAL_BASED_OUTPATIENT_CLINIC_OR_DEPARTMENT_OTHER): Payer: Self-pay | Admitting: Emergency Medicine

## 2022-12-21 ENCOUNTER — Emergency Department (HOSPITAL_BASED_OUTPATIENT_CLINIC_OR_DEPARTMENT_OTHER): Payer: Medicare HMO

## 2022-12-21 DIAGNOSIS — Z7901 Long term (current) use of anticoagulants: Secondary | ICD-10-CM | POA: Insufficient documentation

## 2022-12-21 DIAGNOSIS — Z79899 Other long term (current) drug therapy: Secondary | ICD-10-CM | POA: Diagnosis not present

## 2022-12-21 DIAGNOSIS — I10 Essential (primary) hypertension: Secondary | ICD-10-CM | POA: Insufficient documentation

## 2022-12-21 DIAGNOSIS — E119 Type 2 diabetes mellitus without complications: Secondary | ICD-10-CM | POA: Insufficient documentation

## 2022-12-21 DIAGNOSIS — N132 Hydronephrosis with renal and ureteral calculous obstruction: Secondary | ICD-10-CM | POA: Diagnosis not present

## 2022-12-21 DIAGNOSIS — R109 Unspecified abdominal pain: Secondary | ICD-10-CM | POA: Diagnosis not present

## 2022-12-21 DIAGNOSIS — Z794 Long term (current) use of insulin: Secondary | ICD-10-CM | POA: Diagnosis not present

## 2022-12-21 DIAGNOSIS — Z7984 Long term (current) use of oral hypoglycemic drugs: Secondary | ICD-10-CM | POA: Diagnosis not present

## 2022-12-21 LAB — CBC WITH DIFFERENTIAL/PLATELET
Abs Immature Granulocytes: 0.05 10*3/uL (ref 0.00–0.07)
Basophils Absolute: 0 10*3/uL (ref 0.0–0.1)
Basophils Relative: 1 %
Eosinophils Absolute: 0.1 10*3/uL (ref 0.0–0.5)
Eosinophils Relative: 2 %
HCT: 30.8 % — ABNORMAL LOW (ref 36.0–46.0)
Hemoglobin: 10.4 g/dL — ABNORMAL LOW (ref 12.0–15.0)
Immature Granulocytes: 1 %
Lymphocytes Relative: 13 %
Lymphs Abs: 0.7 10*3/uL (ref 0.7–4.0)
MCH: 29.7 pg (ref 26.0–34.0)
MCHC: 33.8 g/dL (ref 30.0–36.0)
MCV: 88 fL (ref 80.0–100.0)
Monocytes Absolute: 0.5 10*3/uL (ref 0.1–1.0)
Monocytes Relative: 9 %
Neutro Abs: 4.3 10*3/uL (ref 1.7–7.7)
Neutrophils Relative %: 74 %
Platelets: 181 10*3/uL (ref 150–400)
RBC: 3.5 MIL/uL — ABNORMAL LOW (ref 3.87–5.11)
RDW: 12.9 % (ref 11.5–15.5)
WBC: 5.7 10*3/uL (ref 4.0–10.5)
nRBC: 0 % (ref 0.0–0.2)

## 2022-12-21 LAB — URINALYSIS, ROUTINE W REFLEX MICROSCOPIC
Glucose, UA: NEGATIVE mg/dL
Ketones, ur: 15 mg/dL — AB
Leukocytes,Ua: NEGATIVE
Nitrite: NEGATIVE
Protein, ur: >=300 mg/dL — AB
Specific Gravity, Urine: >=1.03 (ref 1.005–1.030)
pH: 5.5 (ref 5.0–8.0)

## 2022-12-21 LAB — URINALYSIS, MICROSCOPIC (REFLEX)

## 2022-12-21 LAB — BASIC METABOLIC PANEL
Anion gap: 10 (ref 5–15)
BUN: 19 mg/dL (ref 8–23)
CO2: 23 mmol/L (ref 22–32)
Calcium: 8.6 mg/dL — ABNORMAL LOW (ref 8.9–10.3)
Chloride: 105 mmol/L (ref 98–111)
Creatinine, Ser: 1.08 mg/dL — ABNORMAL HIGH (ref 0.44–1.00)
GFR, Estimated: 54 mL/min — ABNORMAL LOW (ref >60)
Glucose, Bld: 139 mg/dL — ABNORMAL HIGH (ref 70–99)
Potassium: 3.5 mmol/L (ref 3.5–5.1)
Sodium: 138 mmol/L (ref 135–145)

## 2022-12-21 NOTE — ED Provider Notes (Addendum)
[place] [facility] Provider Note   CSN: [id number] Arrival date & time: 12/21/22  1317     History  Chief Complaint  Patient presents with   Flank Pain    Jacqueline [name] is a 72 y.o. female.  Patient with the onset of some left flank pain 5 days ago.  Patient was visiting their daughter in [place] that was having some health concerns.  Patient seen in the [place] system.  They through ultrasound and CT scan confirmed a left-sided 4 mm stone fairly proximal.  With fairly significant hydronephrosis.  And concerns for secondary infection.  Patient had been having fever and chills.  They started her on Cipro she received IV Cipro has been taking Cipro tablets as well.  They also started her on Flomax.  And a European mild pain medicine that is their version of Tylenol and caffeine patient flew back for follow-up.  Patient was seen June 2024 here with a diagnosis of a 2 mm stone on the left side.  And patient states she did follow-up with urology she is [facility].  Patient has not had any recent nausea or vomiting fever or chills.  At the [place] medical institution they wanted to admit her.  They were recommending stent.  [place].  In addition patient has had some mild left flank pain but it is much improved from where it was 5 days ago.  Patient vital signs here are reassuring temp 97.5 blood pressure 111/57 and heart rate is 70.  None of that seems to be suggestive of a septic picture.  Patient's past medical history sniffing for diabetes hypertension nephrolithiasis hyperlipidemia history of TIAs.  Patient has never used tobacco products.  Patient's primary care doctor is Dr. Peggyann Juba.       Home Medications Prior to Admission medications   Medication Sig Start Date End Date Taking? Authorizing Provider  alendronate (FOSAMAX) 70 MG tablet Take 1 tablet (70 mg total) by  mouth every 7 (seven) days. Take with a full glass of water on an empty stomach. 09/10/22   Sandford Craze, NP  Cholecalciferol (VITAMIN D3) 75 MCG (3000 UT) TABS Take 1 tablet by mouth daily at 6 (six) AM. 04/16/22   Sandford Craze, NP  clopidogrel (PLAVIX) 75 MG tablet Take 1 tablet (75 mg total) by mouth daily. 11/18/22   Sandford Craze, NP  Fluocinolone Acetonide (DERMOTIC) 0.01 % OIL Place 5 drops in ear(s) 2 (two) times daily as needed (Itching in ears). 09/05/22   Theadora Rama Scales, PA-C  Gabapentin 10 % CREA Apply 1 Application topically at bedtime. 04/11/22   Shamleffer, Konrad Dolores, MD  glucose blood (ACCU-CHEK GUIDE) test strip USE AS INSTRUCTED TO TEST BLOOD SUGARS 3 TIMES DAILY E11.65 06/24/22   Shamleffer, Konrad Dolores, MD  insulin detemir (LEVEMIR FLEXPEN) 100 UNIT/ML FlexPen Inject 20 Units into the skin daily. 10/28/21   Shamleffer, Konrad Dolores, MD  insulin glargine (LANTUS) 100 UNIT/ML injection INJECT 20 UNITS INTO THE SKIN DAILY 01/14/22   Shamleffer, Konrad Dolores, MD  Insulin Pen Needle (BD PEN NEEDLE NANO 2ND GEN) 32G X 4 MM MISC Inject 1 Device into the skin daily in the afternoon. USE DAILY AS DIRECTED 04/29/21   Shamleffer, Konrad Dolores, MD  Lancets (ACCU-CHEK MULTICLIX) lancets Use as instructed to test blood sugars 3 times daily E11.65 10/04/18   Shamleffer, Konrad Dolores, MD  losartan (COZAAR) 50 MG tablet  Take 1 tablet (50 mg total) by mouth daily. 10/08/22   Sandford Craze, NP  metFORMIN (GLUCOPHAGE) 1000 MG tablet Take 1 tablet (1,000 mg total) by mouth 2 (two) times daily with a meal. 02/12/22   Sandford Craze, NP  neomycin-polymyxin-hydrocortisone (CORTISPORIN) OTIC solution Place 3 drops into the right ear 4 (four) times daily. 02/05/22   Saguier, Ramon Dredge, PA-C  ondansetron (ZOFRAN-ODT) 4 MG disintegrating tablet Take 1 tablet (4 mg total) by mouth every 8 (eight) hours as needed for nausea or vomiting. 07/15/22   Henderly, Britni A, PA-C   oxyCODONE-acetaminophen (PERCOCET/ROXICET) 5-325 MG tablet Take 1 tablet by mouth every 6 (six) hours as needed for severe pain. Patient not taking: Reported on 10/21/2022 07/15/22   Henderly, Britni A, PA-C  pantoprazole (PROTONIX) 20 MG tablet Take 1 tablet (20 mg total) by mouth daily. 05/30/22   Sandford Craze, NP  rosuvastatin (CRESTOR) 40 MG tablet TAKE 1 TABLET BY MOUTH EVERYDAY AT BEDTIME 10/19/22   Sandford Craze, NP  topiramate (TOPAMAX) 50 MG tablet Take 1 tablet (50 mg total) by mouth 2 (two) times daily. 04/09/22   Ihor Austin, NP      Allergies    Augmentin [amoxicillin-pot clavulanate], Definity [perflutren lipid microsphere], Novocain [procaine], Promethazine, and Prozac [fluoxetine hcl]    Review of Systems   Review of Systems  Constitutional:  Positive for chills and fever.  HENT:  Negative for ear pain and sore throat.   Eyes:  Negative for pain and visual disturbance.  Respiratory:  Negative for cough and shortness of breath.   Cardiovascular:  Negative for chest pain and palpitations.  Gastrointestinal:  Positive for nausea and vomiting. Negative for abdominal pain.  Genitourinary:  Positive for flank pain. Negative for dysuria and hematuria.  Musculoskeletal:  Negative for arthralgias and back pain.  Skin:  Negative for color change and rash.  Neurological:  Negative for seizures and syncope.  All other systems reviewed and are negative.   Physical Exam Updated Vital Signs BP (!) 111/57 (BP Location: Right Arm)   Pulse 70   Temp (!) 97.5 F (36.4 C) (Oral)   Resp 18   Ht 1.651 m (5\' 5" )   Wt 80.4 kg   SpO2 97%   BMI 29.50 kg/m  Physical Exam Vitals and nursing note reviewed.  Constitutional:      General: She is not in acute distress.    Appearance: Normal appearance. She is well-developed.  HENT:     Head: Normocephalic and atraumatic.     Mouth/Throat:     Mouth: Mucous membranes are moist.     Comments: Mucous membranes are very  moist. Eyes:     Extraocular Movements: Extraocular movements intact.     Conjunctiva/sclera: Conjunctivae normal.     Pupils: Pupils are equal, round, and reactive to light.  Cardiovascular:     Rate and Rhythm: Normal rate and regular rhythm.     Heart sounds: No murmur heard. Pulmonary:     Effort: Pulmonary effort is normal. No respiratory distress.     Breath sounds: Normal breath sounds.  Abdominal:     General: There is no distension.     Palpations: Abdomen is soft.     Tenderness: There is no abdominal tenderness. There is no guarding.  Musculoskeletal:        General: No swelling.     Cervical back: Normal range of motion and neck supple.  Skin:    General: Skin is warm and dry.  Capillary Refill: Capillary refill takes less than 2 seconds.  Neurological:     General: No focal deficit present.     Mental Status: She is alert and oriented to person, place, and time.  Psychiatric:        Mood and Affect: Mood normal.     ED Results / Procedures / Treatments   Labs (all labs ordered are listed, but only abnormal results are displayed) Labs Reviewed  CBC WITH DIFFERENTIAL/PLATELET - Abnormal; Notable for the following components:      Result Value   RBC 3.50 (*)    Hemoglobin 10.4 (*)    HCT 30.8 (*)    All other components within normal limits  BASIC METABOLIC PANEL - Abnormal; Notable for the following components:   Glucose, Bld 139 (*)    Creatinine, Ser 1.08 (*)    Calcium 8.6 (*)    GFR, Estimated 54 (*)    All other components within normal limits  URINALYSIS, ROUTINE W REFLEX MICROSCOPIC    EKG None  Radiology No results found.  Procedures Procedures    Medications Ordered in ED Medications - No data to display  ED Course/ Medical Decision Making/ A&P                                 Medical Decision Making Amount and/or Complexity of Data Reviewed Labs: ordered. Radiology: ordered.   Patient evaluated in [place] sounds as if she  has a left-sided 4 mm proximal stone with obstruction and secondary infection.  Patient was treated with IV Cipro and then discharged on oral Cipro.  As well as Flomax and a mild pain medicine.  Patient is followed [facility] here.  From an infection standpoint is reassuring the patient CBC white blood cell count 5.7 hemoglobin 10.4 platelets 181.  Basic metabolic panel pending and CT renal studies pending.  Patient's basic metabolic panel electrolytes are normal.  GFR is 54 creatinine 1.08 which is somewhat consistent to where she was in June.  CT renal study still pending.  Based on vital signs and at least that white blood cell count no concerns for sepsis at this time.  Plus patient is feeling better on the Cipro.   Final Clinical Impression(s) / ED Diagnoses Final diagnoses:  Left flank pain    Rx / DC Orders ED Discharge Orders     None         Vanetta Mulders, MD 12/21/22 1433    Vanetta Mulders, MD 12/21/22 [phone]

## 2022-12-21 NOTE — ED Notes (Signed)
Pt off the floor in CT 

## 2022-12-21 NOTE — ED Provider Notes (Signed)
Care of patient received from prior provider at 3:16 PM, please see their note for complete H/P and care plan.  Received handoff per ED course.  Clinical Course as of 12/21/22 1516  Sun Dec 21, 2022  1514 Stable: HO from [facility] 25 YOF with a chief complaint of flank pain. Hx NL. 5 days of symptoms. Subjective fever and chills.  [CC]  1515 White count good now. Clinically well appearing.  No nephropathy on BMP Likely OP management with uro. [CC]    Clinical Course User Index [CC] Glyn Ade, MD   On reassessment patient symptoms are grossly resolved.  She does not want any further medications.  She will follow-up with [facility].  Does not appear to be infected, does not have lab evidence of nephropathy and is in no acute distress stable for outpatient care and management with continued Flomax and pain control.     Glyn Ade, MD 12/21/22 2252

## 2022-12-21 NOTE — ED Triage Notes (Signed)
Pt c/o LT flank pain x 5d; was seen at an ED in [place] for same and was found to have kidney stones; pt wanted to come back to the [place] to have procedure for stones

## 2022-12-23 ENCOUNTER — Telehealth: Payer: Self-pay | Admitting: *Deleted

## 2022-12-23 NOTE — Transitions of Care (Post Inpatient/ED Visit) (Unsigned)
   12/23/2022  Name: Jacqueline [name] MRN: [id number] DOB: February 19, 1950  Today's TOC FU Call Status: Today's TOC FU Call Status:: Unsuccessful Call (1st Attempt) Unsuccessful Call (1st Attempt) Date: 12/23/22  Attempted to reach the patient regarding the most recent Inpatient/ED visit.  Follow Up Plan: Additional outreach attempts will be made to reach the patient to complete the Transitions of Care (Post Inpatient/ED visit) call.   Signature Mahathi Pokorney, [facility]

## 2022-12-24 ENCOUNTER — Telehealth: Payer: Self-pay | Admitting: *Deleted

## 2022-12-24 NOTE — Transitions of Care (Post Inpatient/ED Visit) (Signed)
   12/24/2022  Name: ALIXANDRA BIBA MRN: [id number] DOB: 19-Feb-1950  Today's TOC FU Call Status: Today's TOC FU Call Status:: Unsuccessful Call (2nd Attempt) Unsuccessful Call (2nd Attempt) Date: 12/24/22  Attempted to reach the patient regarding the most recent Inpatient/ED visit.  Follow Up Plan: Additional outreach attempts will be made to reach the patient to complete the Transitions of Care (Post Inpatient/ED visit) call.   Signature Fujie Dickison, [facility]

## 2022-12-30 DIAGNOSIS — N200 Calculus of kidney: Secondary | ICD-10-CM | POA: Diagnosis not present

## 2023-01-01 ENCOUNTER — Telehealth: Payer: Self-pay

## 2023-01-01 NOTE — Telephone Encounter (Signed)
An Medical clearance was faxed to [place] from [facility]. They're asking [place] to approve the patient to hold off taking Plavix five days before procedure. I have placed clearance forms on [place], NP desk for further review.

## 2023-01-01 NOTE — Telephone Encounter (Signed)
Patient with history of stroke in 09/2020 and has been overall stable since that time.  Prior visit 03/2022, doing well.  Clearance request to undergo lithotripsy and requested holding Plavix 5 days prior.  As long as she has been stable over the past 6 months without any new or worsening stroke/TIA symptoms, she can proceed with procedure with holding Plavix 5 days prior as requested with small but is optimal risk of recurrent stroke while off therapy, recommend restarting immediately after or once hemodynamically stable.

## 2023-01-12 DIAGNOSIS — N200 Calculus of kidney: Secondary | ICD-10-CM | POA: Diagnosis not present

## 2023-01-22 DIAGNOSIS — N201 Calculus of ureter: Secondary | ICD-10-CM | POA: Diagnosis not present

## 2023-01-22 DIAGNOSIS — N200 Calculus of kidney: Secondary | ICD-10-CM | POA: Diagnosis not present

## 2023-02-09 ENCOUNTER — Ambulatory Visit (INDEPENDENT_AMBULATORY_CARE_PROVIDER_SITE_OTHER): Payer: Medicare HMO | Admitting: Physician Assistant

## 2023-02-09 VITALS — BP 121/75 | HR 85 | Temp 97.6°F | Ht 65.0 in | Wt 161.1 lb

## 2023-02-09 DIAGNOSIS — R6889 Other general symptoms and signs: Secondary | ICD-10-CM

## 2023-02-09 DIAGNOSIS — J069 Acute upper respiratory infection, unspecified: Secondary | ICD-10-CM

## 2023-02-09 MED ORDER — AZELASTINE HCL 0.1 % NA SOLN
1.0000 | Freq: Two times a day (BID) | NASAL | 0 refills | Status: DC
Start: 2023-02-09 — End: 2023-03-10

## 2023-02-09 NOTE — Progress Notes (Signed)
Established patient visit   Patient: Jacqueline [name]   DOB: [birth year]   72 y.o. Female  MRN: [id number] Visit Date: 02/09/2023  Today's healthcare provider: Alfredia Ferguson, PA-C   Chief Complaint  Patient presents with   Nasal Congestion    Been present since Thursday or Friday- has had fever over the weekend. Headache and bodyache. Ears ache. OTC- Robitussin and tylenol and benadryl    Subjective     Pt reports fever over the weekend, cough, congestion, headache, body aches starting Friday, x 4 days. She has been taking tylenol, benadryl, robitussion otc.  Medications: Outpatient Medications Prior to Visit  Medication Sig   alendronate (FOSAMAX) 70 MG tablet Take 1 tablet (70 mg total) by mouth every 7 (seven) days. Take with a full glass of water on an empty stomach.   Cholecalciferol (VITAMIN D3) 75 MCG (3000 UT) TABS Take 1 tablet by mouth daily at 6 (six) AM.   clopidogrel (PLAVIX) 75 MG tablet Take 1 tablet (75 mg total) by mouth daily.   Fluocinolone Acetonide (DERMOTIC) 0.01 % OIL Place 5 drops in ear(s) 2 (two) times daily as needed (Itching in ears).   Gabapentin 10 % CREA Apply 1 Application topically at bedtime.   glucose blood (ACCU-CHEK GUIDE) test strip USE AS INSTRUCTED TO TEST BLOOD SUGARS 3 TIMES DAILY E11.65   insulin detemir (LEVEMIR FLEXPEN) 100 UNIT/ML FlexPen Inject 20 Units into the skin daily.   insulin glargine (LANTUS) 100 UNIT/ML injection INJECT 20 UNITS INTO THE SKIN DAILY   Insulin Pen Needle (BD PEN NEEDLE NANO 2ND GEN) 32G X 4 MM MISC Inject 1 Device into the skin daily in the afternoon. USE DAILY AS DIRECTED   Lancets (ACCU-CHEK MULTICLIX) lancets Use as instructed to test blood sugars 3 times daily E11.65   losartan (COZAAR) 50 MG tablet Take 1 tablet (50 mg total) by mouth daily.   metFORMIN (GLUCOPHAGE) 1000 MG tablet Take 1 tablet (1,000 mg total) by mouth 2 (two) times daily with a meal.   neomycin-polymyxin-hydrocortisone  (CORTISPORIN) OTIC solution Place 3 drops into the right ear 4 (four) times daily.   ondansetron (ZOFRAN-ODT) 4 MG disintegrating tablet Take 1 tablet (4 mg total) by mouth every 8 (eight) hours as needed for nausea or vomiting.   oxyCODONE-acetaminophen (PERCOCET/ROXICET) 5-325 MG tablet Take 1 tablet by mouth every 6 (six) hours as needed for severe pain.   pantoprazole (PROTONIX) 20 MG tablet Take 1 tablet (20 mg total) by mouth daily.   rosuvastatin (CRESTOR) 40 MG tablet TAKE 1 TABLET BY MOUTH EVERYDAY AT BEDTIME   topiramate (TOPAMAX) 50 MG tablet Take 1 tablet (50 mg total) by mouth 2 (two) times daily.   No facility-administered medications prior to visit.    Review of Systems  Constitutional:  Positive for fatigue and fever.  HENT:  Positive for congestion, postnasal drip, rhinorrhea, sinus pain and sore throat.   Respiratory:  Positive for cough. Negative for shortness of breath.   Cardiovascular:  Negative for chest pain and leg swelling.  Gastrointestinal:  Negative for abdominal pain.  Neurological:  Positive for headaches. Negative for dizziness.       Objective    BP 121/75   Pulse 85   Temp 97.6 F (36.4 C) (Oral)   Ht 5\' 5"  (1.651 m)   Wt 161 lb 2 oz (73.1 kg)   SpO2 97%   BMI 26.81 kg/m    Physical Exam Constitutional:  General: She is awake.     Appearance: She is well-developed.  HENT:     Head: Normocephalic.     Right Ear: Tympanic membrane normal.     Left Ear: Tympanic membrane normal.  Eyes:     Conjunctiva/sclera: Conjunctivae normal.  Cardiovascular:     Rate and Rhythm: Normal rate and regular rhythm.     Heart sounds: Normal heart sounds.  Pulmonary:     Effort: Pulmonary effort is normal.     Breath sounds: Normal breath sounds. No wheezing, rhonchi or rales.  Skin:    General: Skin is warm.  Neurological:     Mental Status: She is alert and oriented to person, place, and time.  Psychiatric:        Attention and Perception:  Attention normal.        Mood and Affect: Mood normal.        Speech: Speech normal.        Behavior: Behavior is cooperative.      Results for orders placed or performed in visit on 02/09/23  POC COVID-19  Result Value Ref Range   SARS Coronavirus 2 Ag Negative Negative  POCT Influenza A/B  Result Value Ref Range   Influenza A, POC Negative Negative   Influenza B, POC Negative Negative    Assessment & Plan    Upper respiratory tract infection, unspecified type -     Azelastine HCl; Place 1 spray into both nostrils 2 (two) times daily. Use in each nostril as directed  Dispense: 30 mL; Refill: 0  Flu-like symptoms -     POC COVID-19 BinaxNow -     POCT Influenza A/B   POC covid/flu negative Advised likely symptoms are viral infection, Recommending tylenol, mucinex, saline nasal spray, rx azelastine nasal spray Recommending hydration, rest.  If symptoms persist past ~ 7 days recommend f/b with office for further recommendations  Return if symptoms worsen or fail to improve.       Alfredia Ferguson, PA-C  [facility] at [facility] [phone] (phone) [phone] (fax)  [facility]

## 2023-02-10 ENCOUNTER — Encounter: Payer: Self-pay | Admitting: Physician Assistant

## 2023-02-10 LAB — POCT INFLUENZA A/B
Influenza A, POC: NEGATIVE
Influenza B, POC: NEGATIVE

## 2023-02-10 LAB — POC COVID19 BINAXNOW: SARS Coronavirus 2 Ag: NEGATIVE

## 2023-02-17 ENCOUNTER — Ambulatory Visit (INDEPENDENT_AMBULATORY_CARE_PROVIDER_SITE_OTHER): Payer: Medicare (Managed Care) | Admitting: Physician Assistant

## 2023-02-17 ENCOUNTER — Encounter: Payer: Self-pay | Admitting: Physician Assistant

## 2023-02-17 VITALS — BP 145/82 | HR 71 | Temp 98.2°F | Ht 65.0 in | Wt 163.1 lb

## 2023-02-17 DIAGNOSIS — J209 Acute bronchitis, unspecified: Secondary | ICD-10-CM

## 2023-02-17 MED ORDER — AZITHROMYCIN 250 MG PO TABS
ORAL_TABLET | ORAL | 0 refills | Status: AC
Start: 2023-02-17 — End: 2023-02-22

## 2023-02-17 NOTE — Progress Notes (Signed)
 Established patient visit   Patient: Jacqueline [name]   DOB: [birth year]   73 y.o. Female  MRN: [id number] Visit Date: 02/17/2023  Today's healthcare provider: Manuelita Flatness, PA-C   Cc. Cough, fatigue, persistent  Subjective     Pt was seen 12/30 for URI, COVID and flu testing negative. Prescribed azelastine  nasal spray.  Today, she reports persistent cough and fatigue, with some shortness of breath with exertion.   Medications: Outpatient Medications Prior to Visit  Medication Sig   alendronate  (FOSAMAX ) 70 MG tablet Take 1 tablet (70 mg total) by mouth every 7 (seven) days. Take with a full glass of water on an empty stomach.   azelastine  (ASTELIN ) 0.1 % nasal spray Place 1 spray into both nostrils 2 (two) times daily. Use in each nostril as directed   Cholecalciferol (VITAMIN D3) 75 MCG (3000 UT) TABS Take 1 tablet by mouth daily at 6 (six) AM.   clopidogrel  (PLAVIX ) 75 MG tablet Take 1 tablet (75 mg total) by mouth daily.   Fluocinolone  Acetonide (DERMOTIC ) 0.01 % OIL Place 5 drops in ear(s) 2 (two) times daily as needed (Itching in ears).   Gabapentin  10 % CREA Apply 1 Application topically at bedtime.   glucose blood (ACCU-CHEK GUIDE) test strip USE AS INSTRUCTED TO TEST BLOOD SUGARS 3 TIMES DAILY E11.65   insulin  detemir (LEVEMIR  FLEXPEN) 100 UNIT/ML FlexPen Inject 20 Units into the skin daily.   insulin  glargine (LANTUS ) 100 UNIT/ML injection INJECT 20 UNITS INTO THE SKIN DAILY   Insulin  Pen Needle (BD PEN NEEDLE NANO 2ND GEN) 32G X 4 MM MISC Inject 1 Device into the skin daily in the afternoon. USE DAILY AS DIRECTED   Lancets (ACCU-CHEK MULTICLIX) lancets Use as instructed to test blood sugars 3 times daily E11.65   losartan  (COZAAR ) 50 MG tablet Take 1 tablet (50 mg total) by mouth daily.   metFORMIN  (GLUCOPHAGE ) 1000 MG tablet Take 1 tablet (1,000 mg total) by mouth 2 (two) times daily with a meal.   neomycin -polymyxin-hydrocortisone (CORTISPORIN) OTIC solution Place  3 drops into the right ear 4 (four) times daily.   ondansetron  (ZOFRAN -ODT) 4 MG disintegrating tablet Take 1 tablet (4 mg total) by mouth every 8 (eight) hours as needed for nausea or vomiting.   oxyCODONE -acetaminophen  (PERCOCET/ROXICET) 5-325 MG tablet Take 1 tablet by mouth every 6 (six) hours as needed for severe pain.   pantoprazole  (PROTONIX ) 20 MG tablet Take 1 tablet (20 mg total) by mouth daily.   rosuvastatin  (CRESTOR ) 40 MG tablet TAKE 1 TABLET BY MOUTH EVERYDAY AT BEDTIME   topiramate  (TOPAMAX ) 50 MG tablet Take 1 tablet (50 mg total) by mouth 2 (two) times daily.   No facility-administered medications prior to visit.    Review of Systems  Constitutional:  Positive for fatigue. Negative for fever.  HENT:  Positive for congestion.   Respiratory:  Positive for cough. Negative for shortness of breath.   Cardiovascular:  Negative for chest pain and leg swelling.  Gastrointestinal:  Negative for abdominal pain.  Neurological:  Negative for dizziness and headaches.       Objective    BP (!) 145/82   Pulse 71   Temp 98.2 F (36.8 C) (Oral)   Ht 5' 5 (1.651 m)   Wt 163 lb 2 oz (74 kg)   SpO2 95%   BMI 27.15 kg/m    Physical Exam Constitutional:      General: She is awake.     Appearance: She  is well-developed.  HENT:     Head: Normocephalic.  Eyes:     Conjunctiva/sclera: Conjunctivae normal.  Cardiovascular:     Rate and Rhythm: Normal rate and regular rhythm.     Heart sounds: Normal heart sounds.  Pulmonary:     Effort: Pulmonary effort is normal.     Breath sounds: Normal breath sounds. No wheezing, rhonchi or rales.  Skin:    General: Skin is warm.  Neurological:     Mental Status: She is alert and oriented to person, place, and time.  Psychiatric:        Attention and Perception: Attention normal.        Mood and Affect: Mood normal.        Speech: Speech normal.        Behavior: Behavior is cooperative.      No results found for any visits on  02/17/23.  Assessment & Plan    Acute bronchitis, unspecified organism -     Azithromycin ; Take 2 tablets on day 1, then 1 tablet daily on days 2 through 5  Dispense: 6 tablet; Refill: 0   Recommending rest, hydration, cont with otc meds  Rx azithromycin  x 5 days  If symptoms worsen would recommend chest xray  Return if symptoms worsen or fail to improve.      Manuelita Flatness, PA-C  [facility] at [facility] [phone] (phone) [phone] (fax)  [facility]

## 2023-03-08 ENCOUNTER — Other Ambulatory Visit: Payer: Self-pay | Admitting: Physician Assistant

## 2023-03-08 DIAGNOSIS — J069 Acute upper respiratory infection, unspecified: Secondary | ICD-10-CM

## 2023-03-12 ENCOUNTER — Emergency Department (HOSPITAL_BASED_OUTPATIENT_CLINIC_OR_DEPARTMENT_OTHER): Payer: Medicare (Managed Care)

## 2023-03-12 ENCOUNTER — Observation Stay (HOSPITAL_COMMUNITY): Payer: Medicare (Managed Care)

## 2023-03-12 ENCOUNTER — Inpatient Hospital Stay (HOSPITAL_BASED_OUTPATIENT_CLINIC_OR_DEPARTMENT_OTHER)
Admission: EM | Admit: 2023-03-12 | Discharge: 2023-03-19 | DRG: 036 | Disposition: A | Discharge status: Home / Self Care | Payer: Medicare (Managed Care) | Attending: Neurology | Admitting: Neurology

## 2023-03-12 ENCOUNTER — Other Ambulatory Visit: Payer: Self-pay

## 2023-03-12 DIAGNOSIS — E785 Hyperlipidemia, unspecified: Secondary | ICD-10-CM | POA: Diagnosis present

## 2023-03-12 DIAGNOSIS — Z8673 Personal history of transient ischemic attack (TIA), and cerebral infarction without residual deficits: Secondary | ICD-10-CM | POA: Diagnosis not present

## 2023-03-12 DIAGNOSIS — I63231 Cerebral infarction due to unspecified occlusion or stenosis of right carotid arteries: Secondary | ICD-10-CM | POA: Diagnosis present

## 2023-03-12 DIAGNOSIS — Z87442 Personal history of urinary calculi: Secondary | ICD-10-CM

## 2023-03-12 DIAGNOSIS — Z7984 Long term (current) use of oral hypoglycemic drugs: Secondary | ICD-10-CM | POA: Diagnosis not present

## 2023-03-12 DIAGNOSIS — Z808 Family history of malignant neoplasm of other organs or systems: Secondary | ICD-10-CM

## 2023-03-12 DIAGNOSIS — Z6828 Body mass index (BMI) 28.0-28.9, adult: Secondary | ICD-10-CM | POA: Diagnosis not present

## 2023-03-12 DIAGNOSIS — K219 Gastro-esophageal reflux disease without esophagitis: Secondary | ICD-10-CM | POA: Diagnosis present

## 2023-03-12 DIAGNOSIS — Z79899 Other long term (current) drug therapy: Secondary | ICD-10-CM

## 2023-03-12 DIAGNOSIS — R29704 NIHSS score 4: Secondary | ICD-10-CM | POA: Diagnosis present

## 2023-03-12 DIAGNOSIS — G43909 Migraine, unspecified, not intractable, without status migrainosus: Secondary | ICD-10-CM | POA: Diagnosis present

## 2023-03-12 DIAGNOSIS — I10 Essential (primary) hypertension: Secondary | ICD-10-CM | POA: Diagnosis present

## 2023-03-12 DIAGNOSIS — Z7983 Long term (current) use of bisphosphonates: Secondary | ICD-10-CM

## 2023-03-12 DIAGNOSIS — R471 Dysarthria and anarthria: Secondary | ICD-10-CM | POA: Diagnosis present

## 2023-03-12 DIAGNOSIS — Z7982 Long term (current) use of aspirin: Secondary | ICD-10-CM

## 2023-03-12 DIAGNOSIS — Z888 Allergy status to other drugs, medicaments and biological substances status: Secondary | ICD-10-CM

## 2023-03-12 DIAGNOSIS — Z794 Long term (current) use of insulin: Secondary | ICD-10-CM

## 2023-03-12 DIAGNOSIS — R29810 Facial weakness: Secondary | ICD-10-CM | POA: Diagnosis present

## 2023-03-12 DIAGNOSIS — I6521 Occlusion and stenosis of right carotid artery: Secondary | ICD-10-CM | POA: Diagnosis not present

## 2023-03-12 DIAGNOSIS — Z8249 Family history of ischemic heart disease and other diseases of the circulatory system: Secondary | ICD-10-CM

## 2023-03-12 DIAGNOSIS — F0390 Unspecified dementia without behavioral disturbance: Secondary | ICD-10-CM | POA: Diagnosis present

## 2023-03-12 DIAGNOSIS — K5900 Constipation, unspecified: Secondary | ICD-10-CM | POA: Diagnosis not present

## 2023-03-12 DIAGNOSIS — I639 Cerebral infarction, unspecified: Principal | ICD-10-CM | POA: Diagnosis present

## 2023-03-12 DIAGNOSIS — Z800 Family history of malignant neoplasm of digestive organs: Secondary | ICD-10-CM

## 2023-03-12 DIAGNOSIS — K649 Unspecified hemorrhoids: Secondary | ICD-10-CM | POA: Diagnosis present

## 2023-03-12 DIAGNOSIS — Z881 Allergy status to other antibiotic agents status: Secondary | ICD-10-CM

## 2023-03-12 DIAGNOSIS — Z7902 Long term (current) use of antithrombotics/antiplatelets: Secondary | ICD-10-CM

## 2023-03-12 DIAGNOSIS — I959 Hypotension, unspecified: Secondary | ICD-10-CM | POA: Diagnosis not present

## 2023-03-12 DIAGNOSIS — E1165 Type 2 diabetes mellitus with hyperglycemia: Secondary | ICD-10-CM | POA: Diagnosis present

## 2023-03-12 DIAGNOSIS — Z884 Allergy status to anesthetic agent status: Secondary | ICD-10-CM

## 2023-03-12 DIAGNOSIS — R001 Bradycardia, unspecified: Secondary | ICD-10-CM | POA: Diagnosis not present

## 2023-03-12 DIAGNOSIS — F418 Other specified anxiety disorders: Secondary | ICD-10-CM | POA: Diagnosis not present

## 2023-03-12 DIAGNOSIS — E119 Type 2 diabetes mellitus without complications: Secondary | ICD-10-CM | POA: Diagnosis not present

## 2023-03-12 DIAGNOSIS — Z820 Family history of epilepsy and other diseases of the nervous system: Secondary | ICD-10-CM

## 2023-03-12 DIAGNOSIS — Z833 Family history of diabetes mellitus: Secondary | ICD-10-CM

## 2023-03-12 DIAGNOSIS — E669 Obesity, unspecified: Secondary | ICD-10-CM | POA: Diagnosis present

## 2023-03-12 LAB — COMPREHENSIVE METABOLIC PANEL
ALT: 10 U/L (ref 0–44)
AST: 14 U/L — ABNORMAL LOW (ref 15–41)
Albumin: 3.9 g/dL (ref 3.5–5.0)
Alkaline Phosphatase: 64 U/L (ref 38–126)
Anion gap: 7 (ref 5–15)
BUN: 17 mg/dL (ref 8–23)
CO2: 25 mmol/L (ref 22–32)
Calcium: 9 mg/dL (ref 8.9–10.3)
Chloride: 105 mmol/L (ref 98–111)
Creatinine, Ser: 0.93 mg/dL (ref 0.44–1.00)
GFR, Estimated: >60 mL/min (ref >60)
Glucose, Bld: 265 mg/dL — ABNORMAL HIGH (ref 70–99)
Potassium: 3.8 mmol/L (ref 3.5–5.1)
Sodium: 137 mmol/L (ref 135–145)
Total Bilirubin: 0.7 mg/dL (ref 0.0–1.2)
Total Protein: 6.8 g/dL (ref 6.5–8.1)

## 2023-03-12 LAB — DIFFERENTIAL
Abs Immature Granulocytes: 0.01 10*3/uL (ref 0.00–0.07)
Basophils Absolute: 0 10*3/uL (ref 0.0–0.1)
Basophils Relative: 1 %
Eosinophils Absolute: 0.2 10*3/uL (ref 0.0–0.5)
Eosinophils Relative: 3 %
Immature Granulocytes: 0 %
Lymphocytes Relative: 21 %
Lymphs Abs: 1.1 10*3/uL (ref 0.7–4.0)
Monocytes Absolute: 0.4 10*3/uL (ref 0.1–1.0)
Monocytes Relative: 7 %
Neutro Abs: 3.7 10*3/uL (ref 1.7–7.7)
Neutrophils Relative %: 68 %

## 2023-03-12 LAB — CBC
HCT: 37.3 % (ref 36.0–46.0)
Hemoglobin: 12.3 g/dL (ref 12.0–15.0)
MCH: 29.2 pg (ref 26.0–34.0)
MCHC: 33 g/dL (ref 30.0–36.0)
MCV: 88.6 fL (ref 80.0–100.0)
Platelets: 224 10*3/uL (ref 150–400)
RBC: 4.21 MIL/uL (ref 3.87–5.11)
RDW: 12.3 % (ref 11.5–15.5)
WBC: 5.4 10*3/uL (ref 4.0–10.5)
nRBC: 0 % (ref 0.0–0.2)

## 2023-03-12 LAB — URINALYSIS, ROUTINE W REFLEX MICROSCOPIC
Bilirubin Urine: NEGATIVE
Glucose, UA: 100 mg/dL — AB
Hgb urine dipstick: NEGATIVE
Ketones, ur: NEGATIVE mg/dL
Nitrite: NEGATIVE
Protein, ur: NEGATIVE mg/dL
Specific Gravity, Urine: 1.01 (ref 1.005–1.030)
pH: 7 (ref 5.0–8.0)

## 2023-03-12 LAB — RAPID URINE DRUG SCREEN, HOSP PERFORMED
Amphetamines: NOT DETECTED
Barbiturates: NOT DETECTED
Benzodiazepines: NOT DETECTED
Cocaine: NOT DETECTED
Opiates: NOT DETECTED
Tetrahydrocannabinol: NOT DETECTED

## 2023-03-12 LAB — URINALYSIS, MICROSCOPIC (REFLEX): RBC / HPF: NONE SEEN RBC/hpf (ref 0–5)

## 2023-03-12 LAB — ETHANOL: Alcohol, Ethyl (B): <10 mg/dL (ref <10)

## 2023-03-12 LAB — PROTIME-INR
INR: 0.9 (ref 0.8–1.2)
Prothrombin Time: 12.4 s (ref 11.4–15.2)

## 2023-03-12 LAB — APTT: aPTT: 25 s (ref 24–36)

## 2023-03-12 LAB — CBG MONITORING, ED: Glucose-Capillary: 248 mg/dL — ABNORMAL HIGH (ref 70–99)

## 2023-03-12 MED ORDER — SENNOSIDES-DOCUSATE SODIUM 8.6-50 MG PO TABS
1.0000 | ORAL_TABLET | Freq: Every evening | ORAL | Status: DC | PRN
  Administered 2023-03-15 – 2023-03-19 (×3): 1 via ORAL
  Filled 2023-03-12 (×3): qty 1

## 2023-03-12 MED ORDER — STROKE: EARLY STAGES OF RECOVERY BOOK
Freq: Once | Status: AC
  Filled 2023-03-12: qty 1

## 2023-03-12 MED ORDER — PANTOPRAZOLE SODIUM 40 MG IV SOLR
40.0000 mg | Freq: Every day | INTRAVENOUS | Status: DC
  Administered 2023-03-12: 40 mg via INTRAVENOUS
  Filled 2023-03-12: qty 10

## 2023-03-12 MED ORDER — OXYCODONE HCL 5 MG PO TABS
5.0000 mg | ORAL_TABLET | Freq: Once | ORAL | Status: AC
  Administered 2023-03-12: 5 mg via ORAL
  Filled 2023-03-12: qty 1

## 2023-03-12 MED ORDER — TENECTEPLASE FOR STROKE: 0.2500 mg/kg | PACK | Freq: Once | INTRAVENOUS | Status: AC

## 2023-03-12 MED ORDER — ONDANSETRON HCL 4 MG/2ML IJ SOLN
4.0000 mg | Freq: Once | INTRAMUSCULAR | Status: AC
  Administered 2023-03-12: 4 mg via INTRAVENOUS
  Filled 2023-03-12: qty 2

## 2023-03-12 MED ORDER — TENECTEPLASE FOR STROKE
PACK | INTRAVENOUS | Status: AC
  Administered 2023-03-12: 19 mg via INTRAVENOUS
  Filled 2023-03-12: qty 10

## 2023-03-12 MED ORDER — IOHEXOL 350 MG/ML SOLN
75.0000 mL | Freq: Once | INTRAVENOUS | Status: AC | PRN
  Administered 2023-03-12: 75 mL via INTRAVENOUS

## 2023-03-12 MED ORDER — HYDROMORPHONE HCL 1 MG/ML IJ SOLN
0.5000 mg | Freq: Once | INTRAMUSCULAR | Status: AC
  Administered 2023-03-12: 0.5 mg via INTRAVENOUS
  Filled 2023-03-12: qty 1

## 2023-03-12 MED ORDER — CHLORHEXIDINE GLUCONATE CLOTH 2 % EX PADS
6.0000 | MEDICATED_PAD | Freq: Once | CUTANEOUS | Status: AC
  Administered 2023-03-12: 6 via TOPICAL

## 2023-03-12 NOTE — Progress Notes (Signed)
[facility] Telemedicine Consult   Requesting Provider: Dr Hyacinth Meeker Consult Participants: Patient, husband, bedside and atrium RN Location of the provider: [facility] Location of the patient: [facility]  This consult was provided via telemedicine with 2-way video and audio communication. The patient/family was informed that care would be provided in this way and agreed to receive care in this manner.    Chief Complaint: Facial numbness, slurred speech  HPI: 73 year old, past medical history of hypertension, hyperlipidemia, obesity, diabetes, TIA versus complex migraine .  She presented with sudden onset of dysarthria, left facial droop facial numbness at 1:30 PM today witnessed by husband who is at her bed side providing history.  Patient also complains of a headache headache is improving but numbness speech difficulty dementia persisted.  Prior history of same headaches which were thought to be complicated migraine as MRI was negative ordered MRI in March 2020.  Shows more chronic cortical and bilaterally. Noncontrast CT scan of the head shows no acute abnormality and no history of this ophthalmology disease. CT angiogram reviewed personally my preliminary read shows no LVO.  LKW: 1330 tnk given?:   yes @ 1534 IR Thrombectomy? No, not a LVO clinically Modified Rankin Scale: 1-No significant post stroke disability and can perform usual duties with stroke symptoms Time of teleneurologist evaluation: 1 505  Exam: Vitals:   03/12/23 1455  BP: (!) 171/85  Pulse: 81  Resp: 16  SpO2: 98%    General:  Pleasant elderly Caucasian lady not in distress. She is drowsy but can be easily aroused and follows commands well.  She has mild dysarthria but no aphasia.  She is oriented to time place and person.  Extraocular movements are full range without nystagmus.  Visual fields are full to bedside confrontation testing.  Mild left lower facial weakness tongue midline.  Motor  system exam shows mild left upper extremity drift with diminished fine finger movements in the left hand.  Symmetric lower extremity strength. Subjective paresthesias around the left leg but no objective sensory loss in the arms and legs. Gait deferred NIHSS 1A: Level of Consciousness - 1 1B: Ask Month and Age - 0 1C: 'Blink Eyes' & 'Squeeze Hands' - 0 2: Test Horizontal Extraocular Movements - 0 3: Test Visual Fields - 0 4: Test Facial Palsy - 1 5A: Test Left Arm Motor Drift - 1 5B: Test Right Arm Motor Drift - 0 6A: Test Left Leg Motor Drift - 0 6B: Test Right Leg Motor Drift - 0 7: Test Limb Ataxia - 0 8: Test Sensation - 0 9: Test Language/Aphasia- 0 10: Test Dysarthria - 1 11: Test Extinction/Inattention - 0 NIHSS score: 4 Premorbid modified Rankin 1  Imaging Reviewed:  CT head shows no acute abnormalities.  Changes of small vessel disease.  Aspect score of 10. CT angiogram of the brain and neck(my preliminary read) no LVO Labs reviewed in epic and pertinent values follow: PT PTT INR platelet count is normal   Assessment: 73 year old lady with sudden onset of dysarthria and left facial droop and numbness and left hand weakness likely due to) cortical lacunar infarct probably from small vessel disease.  She does have mild headache prior history of complicated migraines but has multiple old lacunar infarcts on previous brain imaging and history of TIAs. Patient is within time window for thrombolysis.  I have had a long discussion the patient and her husband at the bedside regarding risk benefits and alternatives to thrombolysis with IV TNK with  4-6% risk of significant intracerebral hemorrhage and other and patient and family understand this and want to proceed with TNK which will be administered as per protocol.  I have reviewed inclusion and exclusion and patient does not have obvious exclusions like recent surgery, fall or being on strong blood thinner. Recommendations: : IV TNK  as per protocol.  Vital signs and neurochecks as per post TNK protocol.  [facility] for admission to the neurological intensive care unit under stroke MD for further stroke evaluation and treatment.  Strict blood pressure control as per post TNK protocol. Further stroke evaluation workup to be ordered by admitting team at [facility]. Long discussion with patient and husband and answered questions.  Discussed with Dr. Eber Hong.     This patient is critically ill and at significant risk of neurological worsening, death and care requires constant monitoring of vital signs, hemodynamics,respiratory and cardiac monitoring, extensive review of multiple databases, frequent neurological assessment, discussion with family, other specialists and medical decision making of high complexity.I have made any additions or clarifications directly to the above note.This critical care time does not reflect procedure time, or teaching time or supervisory time of PA/NP/Med Resident etc but could involve care discussion time.  I spent 50 minutes of neurocritical care time  in the care of  this patient.      Delia Heady, MD [facility] [phone]  If 7pm- 7am, please page neurology on call as listed in AMION.

## 2023-03-12 NOTE — ED Notes (Signed)
Report called to Selena Batten, receiving RN at [address] [facility]

## 2023-03-12 NOTE — ED Provider Notes (Signed)
[place] [facility] Provider Note   CSN: [id number] Arrival date & time: 03/12/23  1444     History  Chief Complaint  Patient presents with   Code Stroke    TIKI TUCCIARONE is a 73 y.o. female.  HPI   This patient is a 73 year old female, she is on medication including Plavix according to the husband.  She has a history of a stroke several years ago, she was in her normal state of health when he last saw her at 1:30 PM, unfortunately she called and said she could not feel her facial, and had dropped a bowl of popcorn which she could not hold, she also was having difficulty talking.  The patient arrives after 1-1/2 hours of symptoms.  She has a headache and feels a little bit of confusion.  No fevers or chills, no nausea or vomiting.  Code stroke activated on arrival  Home Medications Prior to Admission medications   Medication Sig Start Date End Date Taking? Authorizing Provider  alendronate (FOSAMAX) 70 MG tablet Take 1 tablet (70 mg total) by mouth every 7 (seven) days. Take with a full glass of water on an empty stomach. 09/10/22   Sandford Craze, NP  Azelastine HCl 137 MCG/SPRAY SOLN PLACE 1 SPRAY INTO BOTH NOSTRILS 2 (TWO) TIMES DAILY. USE IN EACH NOSTRIL AS DIRECTED 03/10/23   Sandford Craze, NP  Cholecalciferol (VITAMIN D3) 75 MCG (3000 UT) TABS Take 1 tablet by mouth daily at 6 (six) AM. 04/16/22   Sandford Craze, NP  clopidogrel (PLAVIX) 75 MG tablet Take 1 tablet (75 mg total) by mouth daily. 11/18/22   Sandford Craze, NP  Fluocinolone Acetonide (DERMOTIC) 0.01 % OIL Place 5 drops in ear(s) 2 (two) times daily as needed (Itching in ears). 09/05/22   Theadora Rama Scales, PA-C  Gabapentin 10 % CREA Apply 1 Application topically at bedtime. 04/11/22   Shamleffer, Konrad Dolores, MD  glucose blood (ACCU-CHEK GUIDE) test strip USE AS INSTRUCTED TO TEST BLOOD SUGARS 3 TIMES DAILY E11.65 06/24/22   Shamleffer, Konrad Dolores, MD   insulin detemir (LEVEMIR FLEXPEN) 100 UNIT/ML FlexPen Inject 20 Units into the skin daily. 10/28/21   Shamleffer, Konrad Dolores, MD  insulin glargine (LANTUS) 100 UNIT/ML injection INJECT 20 UNITS INTO THE SKIN DAILY 01/14/22   Shamleffer, Konrad Dolores, MD  Insulin Pen Needle (BD PEN NEEDLE NANO 2ND GEN) 32G X 4 MM MISC Inject 1 Device into the skin daily in the afternoon. USE DAILY AS DIRECTED 04/29/21   Shamleffer, Konrad Dolores, MD  Lancets (ACCU-CHEK MULTICLIX) lancets Use as instructed to test blood sugars 3 times daily E11.65 10/04/18   Shamleffer, Konrad Dolores, MD  losartan (COZAAR) 50 MG tablet Take 1 tablet (50 mg total) by mouth daily. 10/08/22   Sandford Craze, NP  metFORMIN (GLUCOPHAGE) 1000 MG tablet Take 1 tablet (1,000 mg total) by mouth 2 (two) times daily with a meal. 02/12/22   Sandford Craze, NP  neomycin-polymyxin-hydrocortisone (CORTISPORIN) OTIC solution Place 3 drops into the right ear 4 (four) times daily. 02/05/22   Saguier, Ramon Dredge, PA-C  ondansetron (ZOFRAN-ODT) 4 MG disintegrating tablet Take 1 tablet (4 mg total) by mouth every 8 (eight) hours as needed for nausea or vomiting. 07/15/22   Henderly, Britni A, PA-C  oxyCODONE-acetaminophen (PERCOCET/ROXICET) 5-325 MG tablet Take 1 tablet by mouth every 6 (six) hours as needed for severe pain. 07/15/22   Henderly, Britni A, PA-C  pantoprazole (PROTONIX) 20 MG tablet Take 1 tablet (20  mg total) by mouth daily. 05/30/22   Sandford Craze, NP  rosuvastatin (CRESTOR) 40 MG tablet TAKE 1 TABLET BY MOUTH EVERYDAY AT BEDTIME 10/19/22   Sandford Craze, NP  topiramate (TOPAMAX) 50 MG tablet Take 1 tablet (50 mg total) by mouth 2 (two) times daily. 04/09/22   Ihor Austin, NP      Allergies    Augmentin [amoxicillin-pot clavulanate], Definity [perflutren lipid microsphere], Novocain [procaine], Promethazine, and Prozac [fluoxetine hcl]    Review of Systems   Review of Systems  All other systems reviewed and are  negative.   Physical Exam Updated Vital Signs BP (!) 149/70   Pulse 70   Temp 98.1 F (36.7 C) (Oral)   Resp 18   Wt 77.6 kg   SpO2 93%   BMI 28.46 kg/m  Physical Exam Vitals and nursing note reviewed.  Constitutional:      General: She is not in acute distress.    Appearance: She is well-developed.  HENT:     Head: Normocephalic and atraumatic.     Mouth/Throat:     Pharynx: No oropharyngeal exudate.  Eyes:     General: No scleral icterus.       Right eye: No discharge.        Left eye: No discharge.     Conjunctiva/sclera: Conjunctivae normal.     Pupils: Pupils are equal, round, and reactive to light.  Neck:     Thyroid: No thyromegaly.     Vascular: No JVD.  Cardiovascular:     Rate and Rhythm: Normal rate and regular rhythm.     Heart sounds: Normal heart sounds. No murmur heard.    No friction rub. No gallop.  Pulmonary:     Effort: Pulmonary effort is normal. No respiratory distress.     Breath sounds: Normal breath sounds. No wheezing or rales.  Abdominal:     General: Bowel sounds are normal. There is no distension.     Palpations: Abdomen is soft. There is no mass.     Tenderness: There is no abdominal tenderness.  Musculoskeletal:        General: No tenderness. Normal range of motion.     Cervical back: Normal range of motion and neck supple.     Right lower leg: No edema.     Left lower leg: No edema.  Lymphadenopathy:     Cervical: No cervical adenopathy.  Skin:    General: Skin is warm and dry.     Findings: No erythema or rash.  Neurological:     Mental Status: She is alert.     Coordination: Coordination normal.     Comments: Left facial droop, slightly slowed finger-nose-finger on the left but no obvious asymmetrical dysmetria.  She has an expressive aphasia having difficulty getting words out, she can tell me her month and day of her birthday but has difficulty getting the year out.  Subtle facial droop, no weakness of the legs, equal grips,  sensation diffusely intact except for the left side of the face were to slightly decreased.  Forehead is spared  Psychiatric:        Behavior: Behavior normal.     ED Results / Procedures / Treatments   Labs (all labs ordered are listed, but only abnormal results are displayed) Labs Reviewed  COMPREHENSIVE METABOLIC PANEL - Abnormal; Notable for the following components:      Result Value   Glucose, Bld 265 (*)    AST 14 (*)  All other components within normal limits  CBG MONITORING, ED - Abnormal; Notable for the following components:   Glucose-Capillary 248 (*)    All other components within normal limits  ETHANOL  PROTIME-INR  APTT  CBC  DIFFERENTIAL  RAPID URINE DRUG SCREEN, HOSP PERFORMED  URINALYSIS, ROUTINE W REFLEX MICROSCOPIC    EKG None  Radiology CT HEAD CODE STROKE WO CONTRAST Result Date: 03/12/2023 CLINICAL DATA:  Code stroke. Confusion, left facial droop, stroke suspected EXAM: CT HEAD WITHOUT CONTRAST TECHNIQUE: Contiguous axial images were obtained from the base of the skull through the vertex without intravenous contrast. RADIATION DOSE REDUCTION: This exam was performed according to the departmental dose-optimization program which includes automated exposure control, adjustment of the mA and/or kV according to patient size and/or use of iterative reconstruction technique. COMPARISON:  04/13/2021 CT head FINDINGS: This study is somewhat limited by motion artifact. Brain: No evidence of acute infarction, hemorrhage, mass, mass effect, or midline shift. No hydrocephalus or extra-axial collection. Periventricular white matter changes, likely the sequela of chronic small vessel ischemic disease. Vascular: No hyperdense vessel. Skull: Negative for fracture or focal lesion. Sinuses/Orbits: No acute finding. Other: The mastoid air cells are well aerated. ASPECTS [facility] Stroke Program Early CT Score) - Ganglionic level infarction (caudate, lentiform nuclei, internal  capsule, insula, M1-M3 cortex): 7 - Supraganglionic infarction (M4-M6 cortex): 3 Total score (0-10 with 10 being normal): 10 IMPRESSION: No acute intracranial process. ASPECTS is 10. These findings were discussed by telephone on 03/12/2023 at 3:30 pm with provider Pascale Maves . Electronically Signed   By: Wiliam Ke M.D.   On: 03/12/2023 15:31    Procedures .Critical Care  Performed by: Eber Hong, MD Authorized by: Eber Hong, MD   Critical care provider statement:    Critical care time (minutes):  45   Critical care time was exclusive of:  Separately billable procedures and treating other patients and teaching time   Critical care was necessary to treat or prevent imminent or life-threatening deterioration of the following conditions:  CNS failure or compromise   Critical care was time spent personally by me on the following activities:  Development of treatment plan with patient or surrogate, discussions with consultants, evaluation of patient's response to treatment, examination of patient, obtaining history from patient or surrogate, review of old charts, re-evaluation of patient's condition, pulse oximetry, ordering and review of radiographic studies, ordering and review of laboratory studies and ordering and performing treatments and interventions   I assumed direction of critical care for this patient from another provider in my specialty: no     Care discussed with: admitting provider   Comments:           Medications Ordered in ED Medications  iohexol (OMNIPAQUE) 350 MG/ML injection 75 mL (75 mLs Intravenous Contrast Given 03/12/23 1518)  tenecteplase (TNKASE) injection for Stroke 19 mg (19 mg Intravenous Given 03/12/23 1534)    ED Course/ Medical Decision Making/ A&P                                 Medical Decision Making Amount and/or Complexity of Data Reviewed Labs: ordered. Radiology: ordered.  Risk Prescription drug management. Decision regarding  hospitalization.    This patient presents to the ED for concern of focal asymmetrical weakness and expressive aphasia, this involves an extensive number of treatment options, and is a complaint that carries with it a high risk of complications and  morbidity.  The differential diagnosis includes hemorrhage, ischemic stroke, mass, tumor, electrolyte abnormalities, fever, infection   Co morbidities that complicate the patient evaluation  Prior stroke   Additional history obtained:  Additional history obtained from medical record External records from outside source obtained and reviewed including MRI from prior stroke showed a acute ischemic infarct in the medial right frontal lobe, multiple old infarcts in the right corona radiata   Lab Tests:  I Ordered, and personally interpreted labs.  The pertinent results include: Alcohol undetectable, INR 0.9, metabolic panel unremarkable, CBC without leukocytosis or anemia, slight hyperglycemia but no signs of DKA   Imaging Studies ordered:  I ordered imaging studies including CT scan of the head as well as angiograms without acute findings I independently visualized and interpreted imaging which showed no acute findings I agree with the radiologist interpretation   Cardiac Monitoring: / EKG:  The patient was maintained on a cardiac monitor.  I personally viewed and interpreted the cardiac monitored which showed an underlying rhythm of: Normal sinus rhythm, no arrhythmia   Consultations Obtained:  I requested consultation with the neurologist Dr. Pearlean Brownie,  and discussed lab and imaging findings as well as pertinent plan - they recommend: Admission to the hospital, he has ordered TNK   Problem List / ED Course / Critical interventions / Medication management  The neurologist has ordered the TNK, the patient will need to go to the neuro ICU   I have reviewed the patients home medicines and have made adjustments as needed   Social  Determinants of Health:  Critically ill with acute ischemic stroke most likely   Test / Admission - Considered:  Patient will be admitted to ICU         Final Clinical Impression(s) / ED Diagnoses Final diagnoses:  Acute ischemic stroke [facility])    Rx / DC Orders ED Discharge Orders     None         Eber Hong, MD 03/12/23 1545

## 2023-03-12 NOTE — Progress Notes (Signed)
Pt note to have blanchable redness on buttock w/o broken skin. Skin/ bony prominence area cover w/ foam.

## 2023-03-12 NOTE — Progress Notes (Addendum)
Pt note to have NIH score of 7 upon arrival to the unit. Dr. Selina Cooley informed and Dr. Ezzie Dural informed. Pt taken down for STAT CT head. Pt also noted to have SBP >180, but no new orders placed prior to STAT CT. Dr. Ezzie Dural informed of pt's c/o headache and SBP -vs- goal.
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
## 2023-03-12 NOTE — Progress Notes (Signed)
1455 Stroke cart activated and elert sent to [facility]. EDP Hyacinth Meeker, MD) at bedside assessing pt. Pt presents with c/o AMS and numbness to L side. Expressive aphasia noted during assessment. LKW 1330 1500 pt to CT 1501 Dr. Pearlean Brownie with [facility] paged at this time. 1507 Dr. Pearlean Brownie connected via stroke cart. SBAR given by [facility].  1518 Pt back to room from CT. Dr. Pearlean Brownie assessing pt at this time.

## 2023-03-12 NOTE — ED Triage Notes (Signed)
Pt arrived via POV , spouse is the historian ., reports pt was watching TV when she expressed facial numbness . Spouse noticed slurred speech , expressive aphasia . And left facial  droop . Last seen normal at 1330 today .  CBG 240 during triage . Alert and oriented x 2 .

## 2023-03-12 NOTE — Progress Notes (Signed)
1527 Dr. Pearlean Brownie reviewing TNK risk and benefits with pt's spouse and pt. 1528 Pt's spouse consent to administration of TNK. Pt weigh 77.6kg, TNK dosage of 19mg /3.63mL to be administered. 1534 TNK (19mg /3.38mL) administered. 1604 No further need from [facility]N. TSRN disconnected from cart at this time.

## 2023-03-13 ENCOUNTER — Inpatient Hospital Stay (HOSPITAL_COMMUNITY): Payer: Medicare (Managed Care)

## 2023-03-13 DIAGNOSIS — I10 Essential (primary) hypertension: Secondary | ICD-10-CM | POA: Diagnosis not present

## 2023-03-13 DIAGNOSIS — I639 Cerebral infarction, unspecified: Secondary | ICD-10-CM

## 2023-03-13 LAB — BASIC METABOLIC PANEL
Anion gap: 8 (ref 5–15)
BUN: 11 mg/dL (ref 8–23)
CO2: 28 mmol/L (ref 22–32)
Calcium: 9.2 mg/dL (ref 8.9–10.3)
Chloride: 101 mmol/L (ref 98–111)
Creatinine, Ser: 1.06 mg/dL — ABNORMAL HIGH (ref 0.44–1.00)
GFR, Estimated: 55 mL/min — ABNORMAL LOW (ref >60)
Glucose, Bld: 141 mg/dL — ABNORMAL HIGH (ref 70–99)
Potassium: 4 mmol/L (ref 3.5–5.1)
Sodium: 137 mmol/L (ref 135–145)

## 2023-03-13 LAB — GLUCOSE, CAPILLARY
Glucose-Capillary: 170 mg/dL — ABNORMAL HIGH (ref 70–99)
Glucose-Capillary: 115 mg/dL — ABNORMAL HIGH (ref 70–99)
Glucose-Capillary: 201 mg/dL — ABNORMAL HIGH (ref 70–99)
Glucose-Capillary: 106 mg/dL — ABNORMAL HIGH (ref 70–99)
Glucose-Capillary: 139 mg/dL — ABNORMAL HIGH (ref 70–99)

## 2023-03-13 LAB — LIPID PANEL
Cholesterol: 198 mg/dL (ref 0–200)
HDL: 50 mg/dL (ref >40)
LDL Cholesterol: 126 mg/dL — ABNORMAL HIGH (ref 0–99)
Total CHOL/HDL Ratio: 4 {ratio}
Triglycerides: 109 mg/dL (ref <150)
VLDL: 22 mg/dL (ref 0–40)

## 2023-03-13 LAB — CBC
HCT: 38.1 % (ref 36.0–46.0)
Hemoglobin: 12.4 g/dL (ref 12.0–15.0)
MCH: 29.2 pg (ref 26.0–34.0)
MCHC: 32.5 g/dL (ref 30.0–36.0)
MCV: 89.6 fL (ref 80.0–100.0)
Platelets: 213 10*3/uL (ref 150–400)
RBC: 4.25 MIL/uL (ref 3.87–5.11)
RDW: 12.3 % (ref 11.5–15.5)
WBC: 7 10*3/uL (ref 4.0–10.5)
nRBC: 0 % (ref 0.0–0.2)

## 2023-03-13 LAB — MAGNESIUM: Magnesium: 2.2 mg/dL (ref 1.7–2.4)

## 2023-03-13 LAB — ECHOCARDIOGRAM COMPLETE
AR max vel: 2.27 cm2
AV Area VTI: 2.06 cm2
AV Area mean vel: 2.14 cm2
AV Mean grad: 3 mm[Hg]
AV Peak grad: 5.7 mm[Hg]
AV Vena cont: 0.3 cm
Ao pk vel: 1.2 m/s
Area-P 1/2: 3.85 cm2
P 1/2 time: 820 ms
S' Lateral: 2.3 cm
Weight: 2736 [oz_av]

## 2023-03-13 LAB — HEMOGLOBIN A1C
Hgb A1c MFr Bld: 7.3 % — ABNORMAL HIGH (ref 4.8–5.6)
Mean Plasma Glucose: 162.81 mg/dL

## 2023-03-13 LAB — MRSA NEXT GEN BY PCR, NASAL: MRSA by PCR Next Gen: NOT DETECTED

## 2023-03-13 MED ORDER — BUTALBITAL-APAP-CAFFEINE 50-325-40 MG PO TABS
1.0000 | ORAL_TABLET | Freq: Four times a day (QID) | ORAL | Status: DC | PRN
  Administered 2023-03-13 – 2023-03-15 (×3): 1 via ORAL
  Filled 2023-03-13 (×3): qty 1

## 2023-03-13 MED ORDER — LORAZEPAM 2 MG/ML IJ SOLN
2.0000 mg | Freq: Once | INTRAMUSCULAR | Status: AC
  Administered 2023-03-13: 2 mg via INTRAVENOUS

## 2023-03-13 MED ORDER — MAGNESIUM SULFATE IN D5W 1-5 GM/100ML-% IV SOLN
1.0000 g | Freq: Once | INTRAVENOUS | Status: AC
  Administered 2023-03-13: 1 g via INTRAVENOUS
  Filled 2023-03-13: qty 100

## 2023-03-13 MED ORDER — TOPIRAMATE 25 MG PO TABS
50.0000 mg | ORAL_TABLET | Freq: Two times a day (BID) | ORAL | Status: DC
  Administered 2023-03-13 – 2023-03-19 (×11): 50 mg via ORAL
  Filled 2023-03-13 (×13): qty 2

## 2023-03-13 MED ORDER — METOCLOPRAMIDE HCL 5 MG/ML IJ SOLN
10.0000 mg | Freq: Once | INTRAMUSCULAR | Status: AC
  Administered 2023-03-13: 10 mg via INTRAVENOUS
  Filled 2023-03-13: qty 2

## 2023-03-13 MED ORDER — CLOPIDOGREL BISULFATE 75 MG PO TABS
75.0000 mg | ORAL_TABLET | Freq: Every day | ORAL | Status: DC
  Administered 2023-03-14 – 2023-03-19 (×6): 75 mg via ORAL
  Filled 2023-03-13 (×6): qty 1

## 2023-03-13 MED ORDER — DIPHENHYDRAMINE HCL 50 MG/ML IJ SOLN
12.5000 mg | Freq: Once | INTRAMUSCULAR | Status: AC
  Administered 2023-03-13: 12.5 mg via INTRAVENOUS
  Filled 2023-03-13: qty 1

## 2023-03-13 MED ORDER — EZETIMIBE 10 MG PO TABS
10.0000 mg | ORAL_TABLET | Freq: Every day | ORAL | Status: DC
  Administered 2023-03-14 – 2023-03-18 (×5): 10 mg via ORAL
  Filled 2023-03-13 (×5): qty 1

## 2023-03-13 MED ORDER — KETOROLAC TROMETHAMINE 15 MG/ML IJ SOLN
15.0000 mg | Freq: Once | INTRAMUSCULAR | Status: AC
  Administered 2023-03-13: 15 mg via INTRAVENOUS
  Filled 2023-03-13: qty 1

## 2023-03-13 MED ORDER — ACETAMINOPHEN 10 MG/ML IV SOLN
1000.0000 mg | Freq: Once | INTRAVENOUS | Status: AC
  Administered 2023-03-13: 1000 mg via INTRAVENOUS
  Filled 2023-03-13: qty 100

## 2023-03-13 MED ORDER — ONDANSETRON HCL 4 MG/2ML IJ SOLN
4.0000 mg | Freq: Three times a day (TID) | INTRAMUSCULAR | Status: DC | PRN
  Administered 2023-03-13: 4 mg via INTRAVENOUS
  Filled 2023-03-13: qty 2

## 2023-03-13 MED ORDER — CHLORHEXIDINE GLUCONATE CLOTH 2 % EX PADS
6.0000 | MEDICATED_PAD | Freq: Every day | CUTANEOUS | Status: DC
  Administered 2023-03-13 – 2023-03-18 (×5): 6 via TOPICAL

## 2023-03-13 MED ORDER — ROSUVASTATIN CALCIUM 20 MG PO TABS
40.0000 mg | ORAL_TABLET | Freq: Every day | ORAL | Status: DC
Start: 2023-03-13 — End: 2023-03-19
  Administered 2023-03-13 – 2023-03-18 (×6): 40 mg via ORAL
  Filled 2023-03-13 (×3): qty 2
  Filled 2023-03-13: qty 8
  Filled 2023-03-13 (×2): qty 2

## 2023-03-13 MED ORDER — INSULIN ASPART 100 UNIT/ML IJ SOLN
0.0000 [IU] | Freq: Three times a day (TID) | INTRAMUSCULAR | Status: DC
  Administered 2023-03-13: 5 [IU] via SUBCUTANEOUS
  Administered 2023-03-14: 3 [IU] via SUBCUTANEOUS
  Administered 2023-03-14 – 2023-03-15 (×2): 2 [IU] via SUBCUTANEOUS
  Administered 2023-03-15: 3 [IU] via SUBCUTANEOUS
  Administered 2023-03-15 – 2023-03-17 (×4): 2 [IU] via SUBCUTANEOUS
  Administered 2023-03-18: 3 [IU] via SUBCUTANEOUS
  Administered 2023-03-18 – 2023-03-19 (×2): 2 [IU] via SUBCUTANEOUS
  Administered 2023-03-19: 3 [IU] via SUBCUTANEOUS

## 2023-03-13 MED ORDER — LACTATED RINGERS IV BOLUS: 1000.0000 mL | Freq: Once | INTRAVENOUS | Status: DC

## 2023-03-13 MED ORDER — ENOXAPARIN SODIUM 40 MG/0.4ML IJ SOSY
40.0000 mg | PREFILLED_SYRINGE | INTRAMUSCULAR | Status: DC
  Administered 2023-03-13 – 2023-03-15 (×3): 40 mg via SUBCUTANEOUS
  Filled 2023-03-13 (×3): qty 0.4

## 2023-03-13 MED ORDER — SODIUM CHLORIDE 0.9 % IV BOLUS
250.0000 mL | Freq: Once | INTRAVENOUS | Status: AC
  Administered 2023-03-13: 250 mL via INTRAVENOUS

## 2023-03-13 MED ORDER — ASPIRIN 81 MG PO TBEC
81.0000 mg | DELAYED_RELEASE_TABLET | Freq: Every day | ORAL | Status: DC
  Administered 2023-03-14 – 2023-03-19 (×6): 81 mg via ORAL
  Filled 2023-03-13 (×6): qty 1

## 2023-03-13 MED ORDER — SODIUM CHLORIDE 0.9 % IV SOLN: INTRAVENOUS | Status: DC

## 2023-03-13 MED ORDER — LACTATED RINGERS IV BOLUS
500.0000 mL | Freq: Once | INTRAVENOUS | Status: AC
  Administered 2023-03-13: 500 mL via INTRAVENOUS

## 2023-03-13 MED ORDER — PANTOPRAZOLE SODIUM 20 MG PO TBEC
20.0000 mg | DELAYED_RELEASE_TABLET | Freq: Every day | ORAL | Status: DC
  Administered 2023-03-14 – 2023-03-19 (×6): 20 mg via ORAL
  Filled 2023-03-13 (×7): qty 1

## 2023-03-13 MED ORDER — INSULIN ASPART 100 UNIT/ML IJ SOLN
0.0000 [IU] | Freq: Every day | INTRAMUSCULAR | Status: DC
  Administered 2023-03-16: 3 [IU] via SUBCUTANEOUS

## 2023-03-13 MED ORDER — LORAZEPAM 2 MG/ML IJ SOLN
INTRAMUSCULAR | Status: AC
  Filled 2023-03-13: qty 1

## 2023-03-13 NOTE — Progress Notes (Signed)
*  PRELIMINARY RESULTS* Echocardiogram 2D Echocardiogram has been performed.  Earlie Server Jossue Rubenstein 03/13/2023, 11:24 AM

## 2023-03-13 NOTE — Progress Notes (Signed)
OT Cancellation Note  Patient Details Name: Jacqueline [name] MRN: [id number] DOB: 09-23-1950   Cancelled Treatment:    Reason Eval/Treat Not Completed: Active bedrest order OT order received and appreciated however this conflicts with current bedrest order set. Please increase activity tolerance as appropriate and remove bedrest from orders. . Please contact OT at [phone] if bed rest order is discontinued. OT will hold evaluation at this time and will check back as time allows pending increased activity orders.   Mateo Flow 03/13/2023, 8:01 AM

## 2023-03-13 NOTE — Progress Notes (Signed)
Patient with reported episodes of vomiting earlier during the day, failed yale swallow evaluation, and placed on NPO diet.  Verbal order from Neuro MD Derry Lory that it was okay to hold patient's 2200 scheduled medications for this evening (ezetimibe, rosuvastatin, and topiramate).   Care ongoing.  Hughie Closs, RN

## 2023-03-13 NOTE — Progress Notes (Addendum)
0900- This RN was doing hourly NIHSS when noted change from previous focused neuro assessment. NIHSS changed from 6 to 10, and this RN alerted the stroke team, Dr. Roda Shutters and Mauri Reading, NP. Orders placed for IV Zofran, Tylenol, P.O Topamax, and Fioricet.   1000- This RN administered ordered medications, with no relief. Patient is still nauseous, and had an episode of emesis. NIH 9. Notified the Stroke team of the event. This RN voiced concerns over emesis and possible agitation during scheduled MRI scan. See new orders tab.  1100- This RN administered IV Reglan, Toradol, and Benadryl as ordered to alleviate and treat suspicions for migraine (which patient has a past medical history of). Medications did not appear to help patient as expected, and patient became more agitated. Orders for head CT were placed around 1140.  1200- Verbal orders from Dr. Roda Shutters to administer 2mg  of Ativan for increasing agitation and inability to continue to lay flat for CT scan.   Mammie Russian, RN

## 2023-03-13 NOTE — Progress Notes (Addendum)
SLP Cancellation Note  Patient Details Name: Jacqueline [name] MRN: [id number] DOB: [birth year]   Cancelled treatment:       Reason Eval/Treat Not Completed: Patient at procedure or test/unavailable. Will continue following to complete cognitive-linguistic evaluation.   1430: Now, pt is experiencing migraine and confusion in addition to having bouts of emesis. Note pt failed the Yale swallow screen. Consider adding orders for a formal swallow evaluation when pt is medically ready.   Gwynneth Aliment, M.A., CF-SLP Speech Language Pathology, Acute Rehabilitation Services  Secure Chat preferred [phone]  03/13/2023, 12:09 PM

## 2023-03-13 NOTE — Progress Notes (Signed)
PT Cancellation Note  Patient Details Name: SULTANA TIERNEY MRN: [id number] DOB: Mar 20, 1950   Cancelled Treatment:    Reason Eval/Treat Not Completed: Patient not medically ready - per RN, pt with migraine, bouts of emesis, now confusion vs baseline. PT to hold today per RN request.  Marye Round, PT DPT Acute Rehabilitation Services Secure Chat Preferred  Office [phone]    Truddie Coco 03/13/2023, 1:38 PM

## 2023-03-13 NOTE — Progress Notes (Addendum)
STROKE TEAM PROGRESS NOTE   BRIEF HPI [name] is a 73 y.o. female with PMH significant for DM2, obesity, HTN, HDL who presented 1/30 to OSH due to waxing and waning L facial droop, numbness, lethargy, slurred speech and headache. CTH negative, CTA no LVO. She was given TNK @ 1534 [facility]), not a candidate for EVT due to no LVO. Transferred to [facility] for stroke work-up.  NIH on Admission to [facility]: 6  SIGNIFICANT HOSPITAL EVENTS 1/30: Given TNK @ 1534 ([facility])  INTERIM HISTORY/SUBJECTIVE  On exam, patient is in acute distress from headache, neuroexam is stable, no nonfocal.  Will add IV Tylenol, Fioricet, zofran, migraine cocktail. Received Oxy and Dilaudid overnight. AM labs stable.   24hour imaging shows small stroke R internal capsule area. Aspirin and Plavix added.   Will keep in ICU due to continued headache pain.   OBJECTIVE  CBC    Component Value Date/Time   WBC 7.0 03/13/2023 0835   RBC 4.25 03/13/2023 0835   HGB 12.4 03/13/2023 0835   HCT 38.1 03/13/2023 0835   PLT 213 03/13/2023 0835   MCV 89.6 03/13/2023 0835   MCH 29.2 03/13/2023 0835   MCHC 32.5 03/13/2023 0835   RDW 12.3 03/13/2023 0835   LYMPHSABS 1.1 03/12/2023 1500   MONOABS 0.4 03/12/2023 1500   EOSABS 0.2 03/12/2023 1500   BASOSABS 0.0 03/12/2023 1500    BMET    Component Value Date/Time   NA 137 03/13/2023 0835   K 4.0 03/13/2023 0835   CL 101 03/13/2023 0835   CO2 28 03/13/2023 0835   GLUCOSE 141 (H) 03/13/2023 0835   BUN 11 03/13/2023 0835   CREATININE 1.06 (H) 03/13/2023 0835   CREATININE 1.27 (H) 03/12/2022 1536   CALCIUM 9.2 03/13/2023 0835   GFRNONAA 55 (L) 03/13/2023 0835    IMAGING past 24 hours CT HEAD WO CONTRAST ( ) Result Date: 03/13/2023 CLINICAL DATA:  Stroke follow-up EXAM: CT HEAD WITHOUT CONTRAST TECHNIQUE: Contiguous axial images were obtained from the base of the skull through the vertex without intravenous contrast. RADIATION DOSE REDUCTION: This exam was performed  according to the departmental dose-optimization program which includes automated exposure control, adjustment of the mA and/or kV according to patient size and/or use of iterative reconstruction technique. COMPARISON:  Head CT from yesterday FINDINGS: Brain: No evidence of acute infarction, hemorrhage, hydrocephalus, extra-axial collection or mass lesion/mass effect. Low-density areas in the periventricular white matter, likely chronic small vessel disease. Small chronic left parietal cortex infarct. Vascular: No hyperdense vessel or unexpected calcification. Skull: Normal. Negative for fracture or focal lesion. Sinuses/Orbits: No acute finding. IMPRESSION: No acute or interval finding. Electronically Signed   By: Tiburcio Pea M.D.   On: 03/13/2023 12:22   ECHOCARDIOGRAM COMPLETE Result Date: 03/13/2023    ECHOCARDIOGRAM REPORT   Patient Name:   Jacqueline [name] Date of Exam: 03/13/2023 Medical Rec #:  [id number]      Height:       65.0 in Accession #:    [id number]     Weight:       171.0 lb Date of Birth:  May 16, 1950      BSA:          1.851 m Patient Age:    72 years       BP:           160/75 mmHg Patient Gender: F              HR:  54 bpm. Exam Location:  Inpatient Procedure: 2D Echo, Cardiac Doppler and Color Doppler Indications:    Stroke I63.9  History:        Patient has prior history of Echocardiogram examinations, most                 recent 09/13/2020. Stroke and TIA; Risk Factors:Hypertension,                 Diabetes, Dyslipidemia and Non-Smoker.  Sonographer:    Dondra Prader RVT RCS Referring Phys: [id number] [facility]  Sonographer Comments: Image acquisition challenging due to uncooperative patient. IMPRESSIONS  1. Left ventricular ejection fraction, by estimation, is 55 to 60%. The left ventricle has normal function. The left ventricle has no regional wall motion abnormalities. Left ventricular diastolic parameters are consistent with Grade II diastolic dysfunction  (pseudonormalization).  2. Right ventricular systolic function is normal. The right ventricular size is normal. Tricuspid regurgitation signal is inadequate for assessing PA pressure.  3. Left atrial size was mildly dilated.  4. The mitral valve is normal in structure. Trivial mitral valve regurgitation. No evidence of mitral stenosis.  5. The aortic valve is tricuspid. Aortic valve regurgitation is mild. No aortic stenosis is present.  6. Aortic dilatation noted. There is borderline dilatation of the ascending aorta, measuring 37 mm.  7. The inferior vena cava is normal in size with <50% respiratory variability, suggesting right atrial pressure of 8 mmHg. FINDINGS  Left Ventricle: Left ventricular ejection fraction, by estimation, is 55 to 60%. The left ventricle has normal function. The left ventricle has no regional wall motion abnormalities. The left ventricular internal cavity size was normal in size. There is  no left ventricular hypertrophy. Left ventricular diastolic parameters are consistent with Grade II diastolic dysfunction (pseudonormalization). Right Ventricle: The right ventricular size is normal. No increase in right ventricular wall thickness. Right ventricular systolic function is normal. Tricuspid regurgitation signal is inadequate for assessing PA pressure. Left Atrium: Left atrial size was mildly dilated. Right Atrium: Right atrial size was normal in size. Pericardium: There is no evidence of pericardial effusion. Mitral Valve: The mitral valve is normal in structure. Trivial mitral valve regurgitation. No evidence of mitral valve stenosis. Tricuspid Valve: The tricuspid valve is normal in structure. Tricuspid valve regurgitation is not demonstrated. Aortic Valve: The aortic valve is tricuspid. Aortic valve regurgitation is mild. Aortic regurgitation PHT measures 820 msec. No aortic stenosis is present. Aortic valve mean gradient measures 3.0 mmHg. Aortic valve peak gradient measures 5.7 mmHg.  Aortic  valve area, by VTI measures 2.06 cm. Pulmonic Valve: The pulmonic valve was normal in structure. Pulmonic valve regurgitation is trivial. Aorta: Aortic dilatation noted. There is borderline dilatation of the ascending aorta, measuring 37 mm. Venous: The inferior vena cava is normal in size with less than 50% respiratory variability, suggesting right atrial pressure of 8 mmHg. IAS/Shunts: No atrial level shunt detected by color flow Doppler.  LEFT VENTRICLE PLAX 2D LVIDd:         4.50 cm   Diastology LVIDs:         2.30 cm   LV e' medial:    7.62 cm/s LV PW:         2.35 cm   LV E/e' medial:  9.2 LV IVS:        1.00 cm   LV e' lateral:   11.00 cm/s LVOT diam:     1.90 cm   LV E/e' lateral: 6.4 LV SV:  59 LV SV Index:   32 LVOT Area:     2.84 cm  RIGHT VENTRICLE             IVC RV Basal diam:  3.25 cm     IVC diam: 2.00 cm RV S prime:     15.00 cm/s TAPSE (M-mode): 1.8 cm LEFT ATRIUM             Index        RIGHT ATRIUM           Index LA diam:        3.50 cm 1.89 cm/m   RA Area:     12.20 cm LA Vol (A2C):   40.8 ml 22.05 ml/m  RA Volume:   28.40 ml  15.35 ml/m LA Vol (A4C):   46.7 ml 25.23 ml/m LA Biplane Vol: 43.5 ml 23.50 ml/m  AORTIC VALVE                    PULMONIC VALVE AV Area (Vmax):    2.27 cm     PV Vmax:          0.85 m/s AV Area (Vmean):   2.14 cm     PV Peak grad:     2.9 mmHg AV Area (VTI):     2.06 cm     PR End Diast Vel: 1.38 msec AV Vmax:           119.50 cm/s AV Vmean:          80.850 cm/s AV VTI:            0.285 m AV Peak Grad:      5.7 mmHg AV Mean Grad:      3.0 mmHg LVOT Vmax:         95.57 cm/s LVOT Vmean:        61.133 cm/s LVOT VTI:          0.207 m LVOT/AV VTI ratio: 0.73 AI PHT:            820 msec AR Vena Contracta: 0.30 cm  AORTA Ao Root diam: 2.80 cm Ao Asc diam:  3.75 cm MITRAL VALVE MV Area (PHT): 3.85 cm    SHUNTS MV Decel Time: 197 msec    Systemic VTI:  0.21 m MV E velocity: 70.20 cm/s  Systemic Diam: 1.90 cm MV A velocity: 59.20 cm/s MV E/A ratio:   1.19 Dalton McleanMD Electronically signed by Wilfred Lacy Signature Date/Time: 03/13/2023/11:46:55 AM    Final    CT HEAD WO CONTRAST ( ) Result Date: 03/12/2023 CLINICAL DATA:  Stroke, follow up EXAM: CT HEAD WITHOUT CONTRAST TECHNIQUE: Contiguous axial images were obtained from the base of the skull through the vertex without intravenous contrast. RADIATION DOSE REDUCTION: This exam was performed according to the departmental dose-optimization program which includes automated exposure control, adjustment of the mA and/or kV according to patient size and/or use of iterative reconstruction technique. COMPARISON:  Same day CT head. FINDINGS: Brain: No evidence of acute large vascular territory infarction, hemorrhage, hydrocephalus, extra-axial collection or mass lesion/mass effect. Similar white matter hypodensities, likely the sequela of chronic microvascular ischemic disease. Vascular: No hyperdense vessel. Skull: No acute fracture. Sinuses/Orbits: Mild paranasal sinus mucosal thickening. No acute orbital findings. IMPRESSION: Stable head CT.  No evidence of acute intracranial abnormality. Electronically Signed   By: Feliberto Harts M.D.   On: 03/12/2023 20:44    Vitals:   03/13/23 1300 03/13/23 1400 03/13/23 1500 03/13/23  1600  BP: (!) 142/68 136/65 (!) 124/44 (!) 98/41  Pulse: 64  62 70  Resp: 19  12 (!) 33  Temp:    98.8 F (37.1 C)  TempSrc:    Axillary  SpO2: 95%  96% 96%  Weight:         PHYSICAL EXAM General:  Alert, well-nourished, well-developed patient in no acute distress Psych:  Mood and affect appropriate for situation CV: Regular rate and rhythm on monitor Respiratory:  Regular, unlabored respirations on room air GI: Abdomen soft and nontender   NEURO:  Mental Status: Lethargic but opens eyes to voice.  She is oriented to age, place, time.  But she does have intermittent slower responses.  She is able to follow all simple commands Speech/Language: speech is without  dysarthria or aphasia.  Intermittent paucity of speech is present.  Naming, repetition, fluency, and comprehension intact.  Cranial Nerves:  II: PERRL. Visual fields full.  III, IV, VI: EOMI. Eyelids elevate symmetrically.  V: Sensation is intact to light touch and symmetrical to face.  VII: Face is symmetrical resting and smiling VIII: hearing intact to voice. IX, X: Palate elevates symmetrically. Phonation is normal.  JY:[id number] shrug 5/5. XII: tongue is midline without fasciculations. Motor:  BUE: 4/5, no drift BLE: 3/5, no drift Tone: is normal and bulk is normal Sensation- Intact to light touch bilaterally. Extinction absent to light touch to DSS.   Coordination: FTN intact bilaterally Gait- deferred  Most Recent NIH: 7    ASSESSMENT/PLAN  Acute Ischemic Infarct:  right parietal small infarct s/p TNK, etiology: Large vessel disease from right ICA stenosis Code Stroke CT head:  No acute abnormality.  ASPECTS 10. CTA head & neck: No LVO, right ICA 75% stenosis progressed from before Repeat Ct Head:   Stable MRI: small right CR/SO infarct 2D Echo: EF 55-60% LDL 126 HgbA1c 7.3 UDS neg VTE prophylaxis - lovenox clopidogrel 75 mg daily prior to admission, now on aspirin 81 mg daily and clopidogrel 75 mg daily for 3 weeks and then Plavix alone. Therapy recommendations:  Pending Disposition:  pending  Hx of Stroke/TIA 07/2018 presented with headache and left arm/face numbness.  CT no acute abnormality.  CTA head and neck unremarkable.  MRI showed no acute infarct, old deep white matter infarcts.  EF 60 to 65%.  LDL 119, A1c 11.2.  Discharged on DAPT and Lipitor 40. 09/2020 admitted for left-sided numbness.  MRI showed right MCA/ACA periventricular white matter small infarcts.  CT head and neck bilateral ICA bulb atherosclerosis with right ICA 40% stenosis.  LDL 134, A1c 7.9.  Discharged on DAPT and Crestor 40.  Right ICA stenosis CTA neck in 09/2020 right ICA 40%  stenosis Current CTA neck showed right ICA 75% stenosis progressed from before Given right parietal stroke this time, will consider vascular surgery consultation  Hypertension Home meds:  losartan 50mg  Stable but fluctuate Long-term BP goal normotensive  Hyperlipidemia Home meds:  Crestor 40mg , resumed in hospital LDL 126, goal < 70 Add Zetia, continue Crestor 40 Continue statin and Zetia at discharge  Diabetes type II Uncontrolled Home meds:  metformin 1000mg , Lantus HgbA1c 7.4, goal < 7.0 CBGs SSI Recommend close follow-up with PCP for better DM control  Headache History of migraine on home Topamax 50 twice daily Severe headache, status post Fioricet, Phenergan, Toradol Continue monitoring May need outpatient headache specialist referral  Other Stroke Risk Factors Obesity, Body mass index is 28.46 kg/m., BMI >/= 30 associated with increased stroke risk,  recommend weight loss, diet and exercise as appropriate   Hospital day # 1  Pt seen by Neuro NP/APP and later by MD. Note/plan to be edited by MD as needed.    Lynnae January, DNP, AGACNP-BC [facility] Please use AMION for contact information & EPIC for messaging.  ATTENDING NOTE: I reviewed above note and agree with the assessment and plan. Pt was seen and examined.   RN at the bedside. Pt is in acute distress from headache, holding her head on the left but neuro exam no change and nonfocal. CT last night again no bleeding or acute change. She is lethargic but eyes open on voice, complaining of HA, orientated to age, place, time but psychomotor slowing. No aphasia but paucity of speech, able to follow all simple commands. Able to name and repeat. No gaze palsy, tracking bilaterally, visual field full, PERRL. No facial droop. Tongue midline. Bilateral UEs 4/5, no drift. Bilaterally LEs 3/5, no drift. Sensation symmetrical bilaterally, b/l FTN intact grossly, gait not tested.   Patient continued to have severe  headache, received Fioricet, migraine cocktail, magnesium still not very helpful.  Received Ativan for MRI, headache has improved.  MRI showed right parietal small infarct.  CT head and neck right ICA 75% stenosis, progressed from before.  Will need a vascular surgery consultation.  Put on DAPT and continue statin, add Zetia.  Aggressive risk factor modification.  BP fluctuate, received IV fluid bolus.  Now on diet, encourage p.o. intake.  For detailed assessment and plan, please refer to above/below as I have made changes wherever appropriate.   Marvel Plan, MD PhD Stroke Neurology 03/13/2023 10:26 PM  This patient is critically ill due to stroke status post TNK, severe headache and at significant risk of neurological worsening, death form recurrent stroke, hemorrhagic conversion, bleeding from TNK. This patient's care requires constant monitoring of vital signs, hemodynamics, respiratory and cardiac monitoring, review of multiple databases, neurological assessment, discussion with family, other specialists and medical decision making of high complexity. I spent 50 minutes of neurocritical care time in the care of this patient.    To contact Stroke Continuity provider, please refer to [email or web address]. After hours, contact General Neurology

## 2023-03-14 ENCOUNTER — Encounter (HOSPITAL_COMMUNITY): Payer: Medicare (Managed Care)

## 2023-03-14 DIAGNOSIS — I639 Cerebral infarction, unspecified: Secondary | ICD-10-CM | POA: Diagnosis not present

## 2023-03-14 DIAGNOSIS — I6521 Occlusion and stenosis of right carotid artery: Secondary | ICD-10-CM

## 2023-03-14 LAB — MAGNESIUM: Magnesium: 2.2 mg/dL (ref 1.7–2.4)

## 2023-03-14 LAB — CBC
HCT: 38.3 % (ref 36.0–46.0)
Hemoglobin: 12.3 g/dL (ref 12.0–15.0)
MCH: 28.6 pg (ref 26.0–34.0)
MCHC: 32.1 g/dL (ref 30.0–36.0)
MCV: 89.1 fL (ref 80.0–100.0)
Platelets: 200 10*3/uL (ref 150–400)
RBC: 4.3 MIL/uL (ref 3.87–5.11)
RDW: 12.3 % (ref 11.5–15.5)
WBC: 5 10*3/uL (ref 4.0–10.5)
nRBC: 0 % (ref 0.0–0.2)

## 2023-03-14 LAB — GLUCOSE, CAPILLARY
Glucose-Capillary: 147 mg/dL — ABNORMAL HIGH (ref 70–99)
Glucose-Capillary: 164 mg/dL — ABNORMAL HIGH (ref 70–99)
Glucose-Capillary: 116 mg/dL — ABNORMAL HIGH (ref 70–99)
Glucose-Capillary: 118 mg/dL — ABNORMAL HIGH (ref 70–99)

## 2023-03-14 LAB — BASIC METABOLIC PANEL
Anion gap: 11 (ref 5–15)
BUN: 11 mg/dL (ref 8–23)
CO2: 21 mmol/L — ABNORMAL LOW (ref 22–32)
Calcium: 8.8 mg/dL — ABNORMAL LOW (ref 8.9–10.3)
Chloride: 106 mmol/L (ref 98–111)
Creatinine, Ser: 0.99 mg/dL (ref 0.44–1.00)
GFR, Estimated: >60 mL/min (ref >60)
Glucose, Bld: 133 mg/dL — ABNORMAL HIGH (ref 70–99)
Potassium: 4 mmol/L (ref 3.5–5.1)
Sodium: 138 mmol/L (ref 135–145)
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
## 2023-03-14 NOTE — Evaluation (Signed)
Occupational Therapy Evaluation Patient Details Name: Jacqueline [name] MRN: [id number] DOB: [birth year] Today's Date: 03/14/2023   History of Present Illness 73 yo female presented 1/30 with sudden onset dysarthria L facial droop facial numbness given TNK 1/30   CT head and CTA negative. MRI demonstrates Punctate foci of acute ischemia within the medial right frontal  lobe. PMH includes hypertension, hyperlipidemia, insulin-dependent type II diabetes, TIA versus complex migraine, obesity.   Clinical Impression   PTA, pt lived at home with spouse and was independent; enjoyed shopping in her free time. Upon eval, pt with decreased LUE coordination, mildly decreased attention to L side, difficulty following multistep commands, and decreased divided attention. Pt needing up to mod cues to follow 2 step commands this session. Needing grossly CGA for safety with OOB mobility. Recommending discharge home with OP OT to optimize coordination and return to ADL/IADL.       If plan is discharge home, recommend the following: A little help with walking and/or transfers;A little help with bathing/dressing/bathroom;Assistance with cooking/housework;Assist for transportation;Help with stairs or ramp for entrance    Functional Status Assessment  Patient has had a recent decline in their functional status and demonstrates the ability to make significant improvements in function in a reasonable and predictable amount of time.  Equipment Recommendations  None recommended by OT    Recommendations for Other Services Speech consult     Precautions / Restrictions Precautions Precautions: Fall (lower risk) Restrictions Weight Bearing Restrictions Per Provider Order: No      Mobility Bed Mobility Overal bed mobility: Modified Independent                  Transfers Overall transfer level: Needs assistance   Transfers: Sit to/from Stand Sit to Stand: Contact guard assist           General  transfer comment: CGA for safety      Balance Overall balance assessment: Mild deficits observed, not formally tested, Needs assistance Sitting-balance support: No upper extremity supported, Feet supported Sitting balance-Leahy Scale: Good     Standing balance support: No upper extremity supported, During functional activity Standing balance-Leahy Scale: Fair                             ADL either performed or assessed with clinical judgement   ADL Overall ADL's : Needs assistance/impaired Eating/Feeding: Set up;Sitting   Grooming: Supervision/safety;Standing   Upper Body Bathing: Set up;Sitting   Lower Body Bathing: Contact guard assist;Sit to/from stand   Upper Body Dressing : Set up;Sitting   Lower Body Dressing: Contact guard assist;Sit to/from stand   Toilet Transfer: Contact guard assist;Ambulation           Functional mobility during ADLs: Contact guard assist       Vision Baseline Vision/History: 0 No visual deficits Ability to See in Adequate Light: 0 Adequate Patient Visual Report: No change from baseline Additional Comments: minimally decreased visual attention to L side of environment     Perception Perception:  (decr attention to L side of environment)       Praxis         Pertinent Vitals/Pain Pain Assessment Pain Assessment: Faces Faces Pain Scale: Hurts a little bit Pain Location: arthritic heel Pain Descriptors / Indicators: Sharp, Sore Pain Intervention(s): Limited activity within patient's tolerance, Monitored during session     Extremity/Trunk Assessment Upper Extremity Assessment Upper Extremity Assessment: Right hand dominant;LUE deficits/detail LUE Deficits /  Details: decreased coordination, dysdiadokinesia, finger to nose   Lower Extremity Assessment Lower Extremity Assessment: Defer to PT evaluation   Cervical / Trunk Assessment Cervical / Trunk Assessment: Normal   Communication Communication Communication:  No apparent difficulties Cueing Techniques: Verbal cues   Cognition Arousal: Alert Behavior During Therapy: WFL for tasks assessed/performed Overall Cognitive Status: Impaired/Different from baseline Area of Impairment: Memory, Following commands, Awareness, Attention                   Current Attention Level: Sustained, Selective (easily distracted. Has difficulty multitasking and following multistep commands) Memory: Decreased short-term memory Following Commands: Follows one step commands consistently, Follows one step commands with increased time, Follows multi-step commands inconsistently (mod cues for two step commands)   Awareness: Intellectual   General Comments: needs cues to recognize limitations at times. mild listing R with mobility in hall     General Comments  VSS on RA    Exercises     Shoulder Instructions      Home Living Family/patient expects to be discharged to:: Private residence Living Arrangements: Spouse/significant other Available Help at Discharge: Family;Other (Comment) (can provide a significant amout of time for care) Type of Home: House Home Access: Stairs to enter [employer] of Steps: 2 Entrance Stairs-Rails: None Home Layout: Two level;Laundry or work area in basement;Able to live on main level with bedroom/bathroom Alternate Teacher, music of Steps: flight to basement Alternate Level Stairs-Rails: Right;Left Bathroom Shower/Tub: IT trainer: Standard Bathroom Accessibility: Yes   Home Equipment: Rollator (4 wheels);Cane - quad          Prior Functioning/Environment Prior Level of Function : Driving;Independent/Modified Independent               ADLs Comments: does a lot of shopping        OT Problem List: Decreased strength;Decreased activity tolerance;Impaired balance (sitting and/or standing);Decreased cognition;Decreased safety awareness      OT  Treatment/Interventions: Self-care/ADL training;Therapeutic exercise;DME and/or AE instruction;Balance training;Patient/family education;Therapeutic activities;Cognitive remediation/compensation;Neuromuscular education    OT Goals(Current goals can be found in the care plan section) Acute Rehab OT Goals Patient Stated Goal: go home OT Goal Formulation: With patient Time For Goal Achievement: 03/28/23 Potential to Achieve Goals: Good  OT Frequency: Min 1X/week    Co-evaluation              AM-PAC OT "6 Clicks" Daily Activity     Outcome Measure Help from another person eating meals?: A Little Help from another person taking care of personal grooming?: A Little Help from another person toileting, which includes using toliet, bedpan, or urinal?: A Little Help from another person bathing (including washing, rinsing, drying)?: A Little Help from another person to put on and taking off regular upper body clothing?: A Little Help from another person to put on and taking off regular lower body clothing?: A Little 6 Click Score: 18   End of Session Equipment Utilized During Treatment: Gait belt Nurse Communication: Mobility status  Activity Tolerance: Patient tolerated treatment well Patient left: in bed;with call bell/phone within reach;with family/visitor present  OT Visit Diagnosis: Unsteadiness on feet (R26.81);Muscle weakness (generalized) (M62.81);Other symptoms and signs involving cognitive function                Time: 4098-1191 OT Time Calculation (min): 25 min Charges:  OT General Charges $OT Visit: 1 Visit OT Evaluation $OT Eval Moderate Complexity: 1 Mod OT Treatments $Self Care/Home Management : 8-22 mins  Tyler Deis, OTR/L [facility] Acute Rehabilitation Office: [phone]   Myrla Halsted 03/14/2023, 1:23 PM

## 2023-03-14 NOTE — Evaluation (Signed)
Physical Therapy Evaluation Patient Details Name: Jacqueline [name] MRN: [id number] DOB: [birth year] Today's Date: 03/14/2023  History of Present Illness  73 yo female presented 1/30 with sudden onset dysarthria L facial droop facial numbness given TNK 1/30   CT head and CTA negative. MRI demonstrates Punctate foci of acute ischemia within the medial right frontal  lobe. PMH includes hypertension, hyperlipidemia, insulin-dependent type II diabetes, TIA versus complex migraine, obesity.  Clinical Impression  Pt admitted with/for s/s of stroke stated above.  Pt presently needing CGA overall with episodes of min assist when asked to divide attention.  Pt currently limited functionally due to the problems listed below.  (see problems list.)  Pt will benefit from PT to maximize function and safety to be able to get home safely with available assist.         If plan is discharge home, recommend the following:  (prn assist)   Can travel by private vehicle        Equipment Recommendations None recommended by PT  Recommendations for Other Services       Functional Status Assessment Patient has had a recent decline in their functional status and demonstrates the ability to make significant improvements in function in a reasonable and predictable amount of time.     Precautions / Restrictions Precautions Precautions: Fall (lower risk) Restrictions Weight Bearing Restrictions Per Provider Order: No      Mobility  Bed Mobility Overal bed mobility: Modified Independent                  Transfers Overall transfer level: Needs assistance   Transfers: Sit to/from Stand Sit to Stand: Contact guard assist           General transfer comment: If not cued, pt doesn't come forward enough and stabilizes herself with calves against the bedframe, but able if cued to come more forward.    Ambulation/Gait Ambulation/Gait assistance: Min assist, Contact guard assist (min initially and when  attention is divided) Gait Distance (Feet): 300 Feet Assistive device: None Gait Pattern/deviations: Step-through pattern   Gait velocity interpretation: 1.31 - 2.62 ft/sec, indicative of limited community ambulator   General Gait Details: symmetrical gait pattern, a little antalgic L.  When asked to do a counting task while walking, drifted L with mild stagger to attemp regaining balance.  Min assist to help recover.  Stairs            Wheelchair Mobility     Tilt Bed    Modified Rankin (Stroke Patients Only) Modified Rankin (Stroke Patients Only) Pre-Morbid Rankin Score: No symptoms Modified Rankin: Moderate disability     Balance Overall balance assessment: Mild deficits observed, not formally tested, Needs assistance Sitting-balance support: No upper extremity supported, Feet supported Sitting balance-Leahy Scale: Good     Standing balance support: No upper extremity supported, During functional activity Standing balance-Leahy Scale: Fair                 High Level Balance Comments: use some BERG activity to assess balance with functional truncal movements/turning             Pertinent Vitals/Pain Pain Assessment Pain Assessment: Faces Faces Pain Scale: Hurts a little bit Pain Location: arthritic heel Pain Descriptors / Indicators: Sharp, Sore Pain Intervention(s): Monitored during session    Home Living Family/patient expects to be discharged to:: Private residence Living Arrangements: Spouse/significant other Available Help at Discharge: Family;Other (Comment) (can provide a significant amout of time for care) Type  of Home: House Home Access: Stairs to enter Entrance Stairs-Rails: None Entrance Stairs-Number of Steps: 2 Alternate Level Stairs-Number of Steps: flight to basement Home Layout: Two level;Laundry or work area in basement;Able to live on main level with bedroom/bathroom Home Equipment: Rollator (4 wheels);Cane - quad      Prior  Function Prior Level of Function : Driving;Independent/Modified Independent                     Extremity/Trunk Assessment   Upper Extremity Assessment Upper Extremity Assessment: Defer to OT evaluation    Lower Extremity Assessment Lower Extremity Assessment: Overall WFL for tasks assessed (L with mild weakness and incoordination only seen in gait.)    Cervical / Trunk Assessment Cervical / Trunk Assessment: Normal  Communication   Communication Communication: No apparent difficulties Cueing Techniques: Verbal cues  Cognition Arousal: Alert Behavior During Therapy: WFL for tasks assessed/performed Overall Cognitive Status: Within Functional Limits for tasks assessed                                          General Comments General comments (skin integrity, edema, etc.): VSS on RA    Exercises     Assessment/Plan    PT Assessment Patient needs continued PT services  PT Problem List Decreased balance;Decreased coordination       PT Treatment Interventions Gait training;Stair training;Functional mobility training;Therapeutic exercise;Balance training;Patient/family education;Neuromuscular re-education    PT Goals (Current goals can be found in the Care Plan section)  Acute Rehab PT Goals Patient Stated Goal: get back independent, go home today. PT Goal Formulation: With patient Time For Goal Achievement: 03/21/23 Potential to Achieve Goals: Good    Frequency Min 1X/week     Co-evaluation               AM-PAC PT "6 Clicks" Mobility  Outcome Measure Help needed turning from your back to your side while in a flat bed without using bedrails?: A Little Help needed moving from lying on your back to sitting on the side of a flat bed without using bedrails?: A Little Help needed moving to and from a bed to a chair (including a wheelchair)?: A Little Help needed standing up from a chair using your arms (e.g., wheelchair or bedside chair)?:  A Little Help needed to walk in hospital room?: A Little Help needed climbing 3-5 steps with a railing? : A Little 6 Click Score: 18    End of Session   Activity Tolerance: Patient tolerated treatment well Patient left: in bed;Other (comment) (with OT) Nurse Communication: Mobility status PT Visit Diagnosis: Unsteadiness on feet (R26.81);Other symptoms and signs involving the nervous system (R29.898)    Time: 1012-1040 PT Time Calculation (min) (ACUTE ONLY): 28 min   Charges:   PT Evaluation $PT Eval Low Complexity: 1 Low PT Treatments $Gait Training: 8-22 mins PT General Charges $$ ACUTE PT VISIT: 1 Visit         03/14/2023  Jacinto Halim., PT Acute Rehabilitation Services [phone]  (office)  Jacqueline [name] 03/14/2023, 11:04 AM
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
## 2023-03-14 NOTE — Plan of Care (Signed)

## 2023-03-14 NOTE — Progress Notes (Addendum)
STROKE TEAM PROGRESS NOTE   BRIEF HPI [name] is a 73 y.o. female with PMH significant for DM2, obesity, HTN, HDL who presented 1/30 to OSH due to waxing and waning L facial droop, numbness, lethargy, slurred speech and headache. CTH negative, CTA no LVO. She was given TNK @ 1534 [facility]), not a candidate for EVT due to no LVO. Transferred to [facility] for stroke work-up.  NIH on Admission to [facility]: 6  SIGNIFICANT HOSPITAL EVENTS 1/30: Given TNK @ 1534 ([facility]) 1/31: Post-TNK imaging shows small R parietal cortex infarct, no bleed.   INTERIM HISTORY/SUBJECTIVE  Family at bedside. Patient sitting up in chair. On exam, slightly slurred speech, left facial droop.  No complaints of headache or nausea.  Consulting vascular surgery due to 75% stenosis seen in R ICA, Carotid Doppler ordered.  Will transfer out of ICU  OBJECTIVE  CBC    Component Value Date/Time   WBC 5.0 03/14/2023 0407   RBC 4.30 03/14/2023 0407   HGB 12.3 03/14/2023 0407   HCT 38.3 03/14/2023 0407   PLT 200 03/14/2023 0407   MCV 89.1 03/14/2023 0407   MCH 28.6 03/14/2023 0407   MCHC 32.1 03/14/2023 0407   RDW 12.3 03/14/2023 0407   LYMPHSABS 1.1 03/12/2023 1500   MONOABS 0.4 03/12/2023 1500   EOSABS 0.2 03/12/2023 1500   BASOSABS 0.0 03/12/2023 1500    BMET    Component Value Date/Time   NA 138 03/14/2023 0407   K 4.0 03/14/2023 0407   CL 106 03/14/2023 0407   CO2 21 (L) 03/14/2023 0407   GLUCOSE 133 (H) 03/14/2023 0407   BUN 11 03/14/2023 0407   CREATININE 0.99 03/14/2023 0407   CREATININE 1.27 (H) 03/12/2022 1536   CALCIUM 8.8 (L) 03/14/2023 0407   GFRNONAA >60 03/14/2023 0407    IMAGING past 24 hours MR BRAIN WO CONTRAST Result Date: 03/13/2023 CLINICAL DATA:  Stroke suspected EXAM: MRI HEAD WITHOUT CONTRAST TECHNIQUE: Multiplanar, multiecho pulse sequences of the brain and surrounding structures were obtained without intravenous contrast. COMPARISON:  04/13/2021 MRI head, 03/13/2023 CT head  FINDINGS: Evaluation is limited by motion Brain: Small focus of restricted diffusion with ADC correlate in the right parietal cortex (series 2, image 34). Additional increased signal on diffusion-weighted imaging correlates with remote infarcts in the right periventricular white matter (series 2, image 33). No acute hemorrhage, mass, mass effect, or midline shift. No hydrocephalus or extra-axial collection. T2 hyperintense signal in the periventricular white matter, likely the sequela of chronic small vessel ischemic disease. Vascular: Grossly normal arterial flow voids. Skull and upper cervical spine: Grossly normal marrow signal. Sinuses/Orbits: No acute finding. IMPRESSION: Small acute infarct in the right parietal cortex. These results will be called to the ordering clinician or representative by the Radiologist Assistant, and communication documented in the PACS or [employer]. Electronically Signed   By: Wiliam Ke M.D.   On: 03/13/2023 18:09   CT HEAD WO CONTRAST ( ) Result Date: 03/13/2023 CLINICAL DATA:  Stroke follow-up EXAM: CT HEAD WITHOUT CONTRAST TECHNIQUE: Contiguous axial images were obtained from the base of the skull through the vertex without intravenous contrast. RADIATION DOSE REDUCTION: This exam was performed according to the departmental dose-optimization program which includes automated exposure control, adjustment of the mA and/or kV according to patient size and/or use of iterative reconstruction technique. COMPARISON:  Head CT from yesterday FINDINGS: Brain: No evidence of acute infarction, hemorrhage, hydrocephalus, extra-axial collection or mass lesion/mass effect. Low-density areas in the periventricular white matter,  likely chronic small vessel disease. Small chronic left parietal cortex infarct. Vascular: No hyperdense vessel or unexpected calcification. Skull: Normal. Negative for fracture or focal lesion. Sinuses/Orbits: No acute finding. IMPRESSION: No acute or  interval finding. Electronically Signed   By: Tiburcio Pea M.D.   On: 03/13/2023 12:22   ECHOCARDIOGRAM COMPLETE Result Date: 03/13/2023    ECHOCARDIOGRAM REPORT   Patient Name:   Jacqueline [name] Date of Exam: 03/13/2023 Medical Rec #:  [id number]      Height:       65.0 in Accession #:    [id number]     Weight:       171.0 lb Date of Birth:  April 16, 1950      BSA:          1.851 m Patient Age:    72 years       BP:           160/75 mmHg Patient Gender: F              HR:           54 bpm. Exam Location:  Inpatient Procedure: 2D Echo, Cardiac Doppler and Color Doppler Indications:    Stroke I63.9  History:        Patient has prior history of Echocardiogram examinations, most                 recent 09/13/2020. Stroke and TIA; Risk Factors:Hypertension,                 Diabetes, Dyslipidemia and Non-Smoker.  Sonographer:    Dondra Prader RVT RCS Referring Phys: [id number] [facility]  Sonographer Comments: Image acquisition challenging due to uncooperative patient. IMPRESSIONS  1. Left ventricular ejection fraction, by estimation, is 55 to 60%. The left ventricle has normal function. The left ventricle has no regional wall motion abnormalities. Left ventricular diastolic parameters are consistent with Grade II diastolic dysfunction (pseudonormalization).  2. Right ventricular systolic function is normal. The right ventricular size is normal. Tricuspid regurgitation signal is inadequate for assessing PA pressure.  3. Left atrial size was mildly dilated.  4. The mitral valve is normal in structure. Trivial mitral valve regurgitation. No evidence of mitral stenosis.  5. The aortic valve is tricuspid. Aortic valve regurgitation is mild. No aortic stenosis is present.  6. Aortic dilatation noted. There is borderline dilatation of the ascending aorta, measuring 37 mm.  7. The inferior vena cava is normal in size with <50% respiratory variability, suggesting right atrial pressure of 8 mmHg. FINDINGS  Left Ventricle: Left  ventricular ejection fraction, by estimation, is 55 to 60%. The left ventricle has normal function. The left ventricle has no regional wall motion abnormalities. The left ventricular internal cavity size was normal in size. There is  no left ventricular hypertrophy. Left ventricular diastolic parameters are consistent with Grade II diastolic dysfunction (pseudonormalization). Right Ventricle: The right ventricular size is normal. No increase in right ventricular wall thickness. Right ventricular systolic function is normal. Tricuspid regurgitation signal is inadequate for assessing PA pressure. Left Atrium: Left atrial size was mildly dilated. Right Atrium: Right atrial size was normal in size. Pericardium: There is no evidence of pericardial effusion. Mitral Valve: The mitral valve is normal in structure. Trivial mitral valve regurgitation. No evidence of mitral valve stenosis. Tricuspid Valve: The tricuspid valve is normal in structure. Tricuspid valve regurgitation is not demonstrated. Aortic Valve: The aortic valve is tricuspid. Aortic valve regurgitation is mild. Aortic regurgitation PHT measures  820 msec. No aortic stenosis is present. Aortic valve mean gradient measures 3.0 mmHg. Aortic valve peak gradient measures 5.7 mmHg. Aortic  valve area, by VTI measures 2.06 cm. Pulmonic Valve: The pulmonic valve was normal in structure. Pulmonic valve regurgitation is trivial. Aorta: Aortic dilatation noted. There is borderline dilatation of the ascending aorta, measuring 37 mm. Venous: The inferior vena cava is normal in size with less than 50% respiratory variability, suggesting right atrial pressure of 8 mmHg. IAS/Shunts: No atrial level shunt detected by color flow Doppler.  LEFT VENTRICLE PLAX 2D LVIDd:         4.50 cm   Diastology LVIDs:         2.30 cm   LV e' medial:    7.62 cm/s LV PW:         2.35 cm   LV E/e' medial:  9.2 LV IVS:        1.00 cm   LV e' lateral:   11.00 cm/s LVOT diam:     1.90 cm   LV E/e'  lateral: 6.4 LV SV:         59 LV SV Index:   32 LVOT Area:     2.84 cm  RIGHT VENTRICLE             IVC RV Basal diam:  3.25 cm     IVC diam: 2.00 cm RV S prime:     15.00 cm/s TAPSE (M-mode): 1.8 cm LEFT ATRIUM             Index        RIGHT ATRIUM           Index LA diam:        3.50 cm 1.89 cm/m   RA Area:     12.20 cm LA Vol (A2C):   40.8 ml 22.05 ml/m  RA Volume:   28.40 ml  15.35 ml/m LA Vol (A4C):   46.7 ml 25.23 ml/m LA Biplane Vol: 43.5 ml 23.50 ml/m  AORTIC VALVE                    PULMONIC VALVE AV Area (Vmax):    2.27 cm     PV Vmax:          0.85 m/s AV Area (Vmean):   2.14 cm     PV Peak grad:     2.9 mmHg AV Area (VTI):     2.06 cm     PR End Diast Vel: 1.38 msec AV Vmax:           119.50 cm/s AV Vmean:          80.850 cm/s AV VTI:            0.285 m AV Peak Grad:      5.7 mmHg AV Mean Grad:      3.0 mmHg LVOT Vmax:         95.57 cm/s LVOT Vmean:        61.133 cm/s LVOT VTI:          0.207 m LVOT/AV VTI ratio: 0.73 AI PHT:            820 msec AR Vena Contracta: 0.30 cm  AORTA Ao Root diam: 2.80 cm Ao Asc diam:  3.75 cm MITRAL VALVE MV Area (PHT): 3.85 cm    SHUNTS MV Decel Time: 197 msec    Systemic VTI:  0.21 m MV E velocity: 70.20 cm/s  Systemic  Diam: 1.90 cm MV A velocity: 59.20 cm/s MV E/A ratio:  1.19 Dalton McleanMD Electronically signed by Wilfred Lacy Signature Date/Time: 03/13/2023/11:46:55 AM    Final     Vitals:   03/14/23 0500 03/14/23 0600 03/14/23 0700 03/14/23 0800  BP:  137/62 124/73 138/71  Pulse: 84 (!) 59 61 75  Resp: 20 18 18 18   Temp:    98 F (36.7 C)  TempSrc:    Oral  SpO2: (!) 74% 97% 96% 95%  Weight:         PHYSICAL EXAM General: Awake, alert, well-nourished, well-developed patient in no acute distress Psych:  Mood and affect appropriate for situation.  Calm and cooperative CV: Regular rate and rhythm on monitor Respiratory:  Regular, unlabored respirations on room air GI: Abdomen soft and nontender   NEURO:  Mental Status: Awake,  alert, fully oriented.  She follows all simple commands.  Able to give clear and coherent history. Speech/Language: Speech is with slight dysarthria.  No aphasia.  Naming, repetition, fluency, and comprehension intact.  Cranial Nerves:  II: PERRL. Visual fields full.  III, IV, VI: EOMI. Eyelids elevate symmetrically.  V: Sensation is intact to light touch and symmetrical to face.  VII: Slight left facial droop VIII: hearing intact to voice. IX, X: Palate elevates symmetrically. Phonation is normal.  JY:[id number] shrug 5/5. XII: tongue is midline without fasciculations. Motor:  BUE: 4/5, no drift BLE: 3/5, no drift Tone: is normal and bulk is normal Sensation- Intact to light touch bilaterally. Extinction absent to light touch to DSS.   Coordination: FTN intact bilaterally Gait- deferred  Most Recent NIH: 7    ASSESSMENT/PLAN  Acute Ischemic Infarct:  right parietal small infarct s/p TNK, etiology: Large vessel disease from right ICA stenosis Code Stroke CT head:  No acute abnormality.  ASPECTS 10. CTA head & neck: No LVO, right ICA 75% stenosis progressed from before Repeat Ct Head:   Stable MRI: small right CR/SO infarct 2D Echo: EF 55-60% Carotid US: PENDING LDL 126 HgbA1c 7.3 UDS neg VTE prophylaxis - lovenox clopidogrel 75 mg daily prior to admission, continue aspirin 81 mg daily and clopidogrel 75 mg daily on discharge. Therapy recommendations: Outpatient PT, OT, SLP Disposition:  pending  Hx of Stroke/TIA 07/2018 presented with headache and left arm/face numbness.  CT no acute abnormality.  CTA head and neck unremarkable.  MRI showed no acute infarct, old deep white matter infarcts.  EF 60 to 65%.  LDL 119, A1c 11.2.  Discharged on DAPT and Lipitor 40. 09/2020 admitted for left-sided numbness.  MRI showed right MCA/ACA periventricular white matter small infarcts.  CT head and neck bilateral ICA bulb atherosclerosis with right ICA 40% stenosis.  LDL 134, A1c 7.9.   Discharged on DAPT and Crestor 40.  Right ICA stenosis CTA neck in 09/2020 right ICA 40% stenosis Current CTA neck showed right ICA 75% stenosis progressed from before Vascular Surgery Dr. Randie Heinz consulted Carotid US: PENDING Plan for right TCAR next week  Hypertension Home meds:  losartan 50mg  Stable  Long-term BP goal normotensive  Hyperlipidemia Home meds:  Crestor 40mg , resumed in hospital LDL 126, goal < 70 Add Zetia, continue Crestor 40 Continue statin and Zetia at discharge  Diabetes type II Uncontrolled Home meds:  metformin 1000mg , Lantus HgbA1c 7.4, goal < 7.0 CBGs SSI Recommend close follow-up with PCP for better DM control  Headache, improved History of migraine on home Topamax 50 twice daily Continue monitoring Fioricet PRN  Other Stroke Risk Factors  Advanced age  Hospital day # 2  Pt seen by Neuro NP/APP and later by MD. Note/plan to be edited by MD as needed.    Lynnae January, DNP, AGACNP-BC [facility] Please use AMION for contact information & EPIC for messaging.  ATTENDING NOTE: I reviewed above note and agree with the assessment and plan. Pt was seen and examined.   Husband at the bedside. Pt sitting in chair, no HA, calm and cooperative. Neuro exam intact except mild left facial droop. Had vascular surgery Dr. Randie Heinz consulted on right ICA stenosis, will do carotid doppler this time and plan for right TCAR next week. Now on DAPT and statin. Add zetia. PT and OT recommend outpt therapy.   For detailed assessment and plan, please refer to above/below as I have made changes wherever appropriate.   Marvel Plan, MD PhD Stroke Neurology 03/14/2023 3:35 PM  This patient is critically ill due to stroke status post TNK, right ICA stenosis and at significant risk of neurological worsening, death form recurrent stroke, hemorrhagic conversion. This patient's care requires constant monitoring of vital signs, hemodynamics, respiratory and cardiac  monitoring, review of multiple databases, neurological assessment, discussion with family, other specialists and medical decision making of high complexity. I spent 30 minutes of neurocritical care time in the care of this patient.

## 2023-03-15 ENCOUNTER — Encounter (HOSPITAL_COMMUNITY): Payer: Medicare (Managed Care)

## 2023-03-15 ENCOUNTER — Encounter (HOSPITAL_COMMUNITY): Payer: Self-pay | Admitting: Neurology

## 2023-03-15 ENCOUNTER — Inpatient Hospital Stay (HOSPITAL_COMMUNITY): Payer: Medicare (Managed Care)

## 2023-03-15 DIAGNOSIS — I6521 Occlusion and stenosis of right carotid artery: Secondary | ICD-10-CM

## 2023-03-15 DIAGNOSIS — I639 Cerebral infarction, unspecified: Secondary | ICD-10-CM | POA: Diagnosis not present

## 2023-03-15 LAB — CBC
HCT: 39.1 % (ref 36.0–46.0)
Hemoglobin: 12.7 g/dL (ref 12.0–15.0)
MCH: 28.9 pg (ref 26.0–34.0)
MCHC: 32.5 g/dL (ref 30.0–36.0)
MCV: 89.1 fL (ref 80.0–100.0)
Platelets: 192 10*3/uL (ref 150–400)
RBC: 4.39 MIL/uL (ref 3.87–5.11)
RDW: 12.1 % (ref 11.5–15.5)
WBC: 6.2 10*3/uL (ref 4.0–10.5)
nRBC: 0 % (ref 0.0–0.2)

## 2023-03-15 LAB — GLUCOSE, CAPILLARY
Glucose-Capillary: 123 mg/dL — ABNORMAL HIGH (ref 70–99)
Glucose-Capillary: 172 mg/dL — ABNORMAL HIGH (ref 70–99)
Glucose-Capillary: 142 mg/dL — ABNORMAL HIGH (ref 70–99)
Glucose-Capillary: 142 mg/dL — ABNORMAL HIGH (ref 70–99)

## 2023-03-15 LAB — BASIC METABOLIC PANEL
Anion gap: 8 (ref 5–15)
BUN: 14 mg/dL (ref 8–23)
CO2: 25 mmol/L (ref 22–32)
Calcium: 9.2 mg/dL (ref 8.9–10.3)
Chloride: 104 mmol/L (ref 98–111)
Creatinine, Ser: 1.21 mg/dL — ABNORMAL HIGH (ref 0.44–1.00)
GFR, Estimated: 47 mL/min — ABNORMAL LOW (ref >60)
Glucose, Bld: 144 mg/dL — ABNORMAL HIGH (ref 70–99)
Potassium: 4.4 mmol/L (ref 3.5–5.1)
Sodium: 137 mmol/L (ref 135–145)

## 2023-03-15 MED ORDER — SODIUM CHLORIDE 0.9 % IV SOLN
INTRAVENOUS | Status: AC
  Administered 2023-03-16: 100 mL/h via INTRAVENOUS

## 2023-03-15 MED ORDER — SODIUM CHLORIDE 0.9 % IV BOLUS
1000.0000 mL | Freq: Once | INTRAVENOUS | Status: AC
  Administered 2023-03-15: 1000 mL via INTRAVENOUS

## 2023-03-15 MED ORDER — STROKE: EARLY STAGES OF RECOVERY BOOK
Status: AC
  Filled 2023-03-15: qty 1

## 2023-03-15 NOTE — Progress Notes (Signed)
VASCULAR LAB    Carotid duplex has been performed.  See CV proc for preliminary results.   Patricia Perales, RVT 03/15/2023, 7:35 PM

## 2023-03-15 NOTE — Progress Notes (Addendum)
STROKE TEAM PROGRESS NOTE   BRIEF HPI [name] is a 73 y.o. female with PMH significant for DM2, obesity, HTN, HDL who presented 1/30 to OSH due to waxing and waning L facial droop, numbness, lethargy, slurred speech and headache. CTH negative, CTA no LVO. She was given TNK @ 1534 [facility]), not a candidate for EVT due to no LVO. Transferred to [facility] for stroke work-up.  NIH on Admission to [facility]: 6  SIGNIFICANT HOSPITAL EVENTS 1/30: Given TNK @ 1534 ([facility]) 1/31: Post-TNK imaging shows small R parietal cortex infarct, no bleed.   INTERIM HISTORY/SUBJECTIVE  Family at bedside.  Patient sitting up in chair.  Neurological exam improved. Vascular surgeon in room, discussed [name] for right TCAR tomorrow and patient was agreeable.  Pending bed availability for transfer out of ICU  OBJECTIVE  CBC    Component Value Date/Time   WBC 6.2 03/15/2023 0507   RBC 4.39 03/15/2023 0507   HGB 12.7 03/15/2023 0507   HCT 39.1 03/15/2023 0507   PLT 192 03/15/2023 0507   MCV 89.1 03/15/2023 0507   MCH 28.9 03/15/2023 0507   MCHC 32.5 03/15/2023 0507   RDW 12.1 03/15/2023 0507   LYMPHSABS 1.1 03/12/2023 1500   MONOABS 0.4 03/12/2023 1500   EOSABS 0.2 03/12/2023 1500   BASOSABS 0.0 03/12/2023 1500    BMET    Component Value Date/Time   NA 137 03/15/2023 0507   K 4.4 03/15/2023 0507   CL 104 03/15/2023 0507   CO2 25 03/15/2023 0507   GLUCOSE 144 (H) 03/15/2023 0507   BUN 14 03/15/2023 0507   CREATININE 1.21 (H) 03/15/2023 0507   CREATININE 1.27 (H) 03/12/2022 1536   CALCIUM 9.2 03/15/2023 0507   GFRNONAA 47 (L) 03/15/2023 0507    IMAGING past 24 hours No results found.   Vitals:   03/15/23 0800 03/15/23 0900 03/15/23 0956 03/15/23 1152  BP: 138/80 139/76    Pulse: 76 76  72  Resp: 20 20  19   Temp: 98.2 F (36.8 C)   97.7 F (36.5 C)  TempSrc: Oral   Oral  SpO2: 96% 96%  96%  Weight:   76 kg   Height:   5\' 5"  (1.651 m)      PHYSICAL EXAM General: Awake, alert,  well-nourished, well-developed patient in no acute distress Psych:  Mood and affect appropriate for situation.  Calm and cooperative CV: Regular rate and rhythm on monitor Respiratory:  Regular, unlabored respirations on room air GI: Abdomen soft and nontender   NEURO:  Mental Status: Awake, alert, fully oriented.  She follows all commands.  Able to give clear and coherent history. Speech/Language: No dysarthria.  No aphasia.  Naming, repetition, fluency, and comprehension intact.  Cranial Nerves:  II: PERRL. Visual fields full.  III, IV, VI: EOMI. Eyelids elevate symmetrically.  V: Sensation is intact to light touch and symmetrical to face.  VII: Slight left facial droop, improved from yesterday's exam VIII: hearing intact to voice. IX, X: Palate elevates symmetrically. Phonation is normal.  ZO:[id number] shrug 5/5. XII: tongue is midline without fasciculations. Motor:  BUE: 4+/5, no drift BLE: 4+5, no drift Tone: is normal and bulk is normal Sensation- Intact to light touch bilaterally. Extinction absent to light touch to DSS.   Coordination: FTN intact bilaterally Gait- deferred  Most Recent NIH: 7    ASSESSMENT/[name]  Stroke:  right parietal small infarct s/p TNK, etiology: Large vessel disease from right ICA stenosis Code Stroke CT head:  No  acute abnormality.  ASPECTS 10. CTA head & neck: No LVO, right ICA 75% stenosis progressed from before Repeat Ct Head:   Stable MRI: small right CR/SO infarct 2D Echo: EF 55-60% Carotid US: PENDING LDL 126 HgbA1c 7.3 UDS neg VTE prophylaxis - lovenox clopidogrel 75 mg daily prior to admission, continue aspirin 81 mg daily and clopidogrel 75 mg daily on discharge. Therapy recommendations: Outpatient PT, OT, SLP Disposition:  pending  Hx of Stroke/TIA 07/2018 presented with headache and left arm/face numbness.  CT no acute abnormality.  CTA head and neck unremarkable.  MRI showed no acute infarct, old deep white matter infarcts.   EF 60 to 65%.  LDL 119, A1c 11.2.  Discharged on DAPT and Lipitor 40. 09/2020 admitted for left-sided numbness.  MRI showed right MCA/ACA periventricular white matter small infarcts.  CT head and neck bilateral ICA bulb atherosclerosis with right ICA 40% stenosis.  LDL 134, A1c 7.9.  Discharged on DAPT and Crestor 40.  Right ICA stenosis CTA neck in 09/2020 right ICA 40% stenosis Current CTA neck showed right ICA 75% stenosis progressed from before Vascular Surgery Dr. Randie Heinz consulted Carotid US: PENDING [name] for right TCAR Monday with Dr. Chestine Spore  Hypertension Home meds:  losartan 50mg  Stable  Long-term BP goal normotensive  Hyperlipidemia Home meds:  Crestor 40mg , resumed in hospital LDL 126, goal < 70 Add Zetia, continue Crestor 40 Continue statin and Zetia at discharge  Diabetes type II Uncontrolled Home meds:  metformin 1000mg , Lantus HgbA1c 7.4, goal < 7.0 CBGs SSI Recommend close follow-up with PCP for better DM control  Headache, improved History of migraine on home Topamax 50 twice daily Continue monitoring Fioricet PRN  Other Stroke Risk Factors Advanced age  Hospital day # 3  Pt seen by Neuro NP/APP and later by MD. Note/[name] to be edited by MD as needed.    Jacqueline January, DNP, AGACNP-BC [facility] Please use AMION for contact information & EPIC for messaging.  ATTENDING NOTE: I reviewed above note and agree with the assessment and [name]. Pt was seen and examined.   Daughter is at bedside.  Patient sitting in chair, left facial weakness much improved.  No significant neurodeficit at this time.  On DAPT and statin. VVS [name] for TCAR tomorrow with Dr. Chestine Spore.  Carotid Doppler still pending today.  For detailed assessment and [name], please refer to above/below as I have made changes wherever appropriate.   Jacqueline Plan, MD PhD Stroke Neurology 03/15/2023 4:54 PM

## 2023-03-15 NOTE — Progress Notes (Signed)
Brief Neuro Update:  RN notified me recurrence of L face, L arm and L leg numbness and L facial droop. On my evaluation, she has a slight left facial droop but no sensory loss or changes.  Will keep head of bed flat, give fluids bolus and start fluids at 172ml/hr.  Erick Blinks [facility]

## 2023-03-15 NOTE — Progress Notes (Signed)
  Progress Note    03/15/2023 12:01 PM * No surgery found *  Subjective: Symptoms improving  Vitals:   03/15/23 0900 03/15/23 1152  BP: 139/76   Pulse: 76 72  Resp: 20 19  Temp:  97.7 F (36.5 C)  SpO2: 96% 96%    Physical Exam: Awake alert and oriented Nonlabored respirations Facial droop resolved today Grip strength bilaterally is within normal limits  CBC    Component Value Date/Time   WBC 6.2 03/15/2023 0507   RBC 4.39 03/15/2023 0507   HGB 12.7 03/15/2023 0507   HCT 39.1 03/15/2023 0507   PLT 192 03/15/2023 0507   MCV 89.1 03/15/2023 0507   MCH 28.9 03/15/2023 0507   MCHC 32.5 03/15/2023 0507   RDW 12.1 03/15/2023 0507   LYMPHSABS 1.1 03/12/2023 1500   MONOABS 0.4 03/12/2023 1500   EOSABS 0.2 03/12/2023 1500   BASOSABS 0.0 03/12/2023 1500    BMET    Component Value Date/Time   NA 137 03/15/2023 0507   K 4.4 03/15/2023 0507   CL 104 03/15/2023 0507   CO2 25 03/15/2023 0507   GLUCOSE 144 (H) 03/15/2023 0507   BUN 14 03/15/2023 0507   CREATININE 1.21 (H) 03/15/2023 0507   CREATININE 1.27 (H) 03/12/2022 1536   CALCIUM 9.2 03/15/2023 0507   GFRNONAA 47 (L) 03/15/2023 0507   GFRAA >60 12/22/2018 2235    INR    Component Value Date/Time   INR 0.9 03/12/2023 1500    No intake or output data in the 24 hours ending 03/15/23 1201   Assessment/plan:  73 y.o. female is with symptomatic right ICA stenosis symptoms now resolving.  She is now on aspirin, Plavix, statin and Zetia.  We have discussed her options including medical therapy versus carotid endarterectomy versus stenting from a transfemoral transcarotid route and they have elected for right transcarotid artery stenting.  We had initially discussed discharge home with return to the hospital for procedure but at this time we will plan for surgical intervention tomorrow.  I have again reiterated the risk and benefits including the risk of stroke, risk of recrudescence, risk of coronary event and patient,  her daughter and husband via telephone all demonstrate good understanding.  We discussed that due to dual antiplatelet therapy she will have significant bruising particularly of her neck and left groin access site and we discussed the procedure and somewhat fine detail and all parties demonstrate fairly good understanding.  Patient will be n.p.o. past midnight.  This discussion was held in conjunction with Dr. Roda Shutters from neurology and he agrees with proceeding tomorrow.    Tamika Nou C. Randie Heinz, MD [facility] Office: [phone] Pager: [phone]  03/15/2023 12:01 PM

## 2023-03-15 NOTE — Progress Notes (Signed)
Notified MD, she had occurrence of face numbness, Left facial droop, left arm numbness  and left leg numbness and slight facial droop.

## 2023-03-16 ENCOUNTER — Encounter (HOSPITAL_COMMUNITY)
Admission: EM | Disposition: A | Discharge status: Home / Self Care | Payer: Self-pay | Source: Home / Self Care | Attending: Neurology

## 2023-03-16 ENCOUNTER — Inpatient Hospital Stay (HOSPITAL_COMMUNITY): Payer: Medicare (Managed Care)

## 2023-03-16 ENCOUNTER — Inpatient Hospital Stay (HOSPITAL_COMMUNITY): Payer: Medicare (Managed Care) | Admitting: Anesthesiology

## 2023-03-16 ENCOUNTER — Other Ambulatory Visit: Payer: Self-pay

## 2023-03-16 ENCOUNTER — Encounter (HOSPITAL_COMMUNITY): Payer: Self-pay | Admitting: Neurology

## 2023-03-16 DIAGNOSIS — E119 Type 2 diabetes mellitus without complications: Secondary | ICD-10-CM | POA: Diagnosis not present

## 2023-03-16 DIAGNOSIS — I639 Cerebral infarction, unspecified: Secondary | ICD-10-CM | POA: Diagnosis not present

## 2023-03-16 DIAGNOSIS — F418 Other specified anxiety disorders: Secondary | ICD-10-CM | POA: Diagnosis not present

## 2023-03-16 DIAGNOSIS — I10 Essential (primary) hypertension: Secondary | ICD-10-CM

## 2023-03-16 DIAGNOSIS — I6521 Occlusion and stenosis of right carotid artery: Secondary | ICD-10-CM

## 2023-03-16 HISTORY — PX: TRANSCAROTID ARTERY REVASCULARIZATIONÂ: SHX6778

## 2023-03-16 HISTORY — PX: ULTRASOUND GUIDANCE FOR VASCULAR ACCESS: SHX6516

## 2023-03-16 LAB — POCT ACTIVATED CLOTTING TIME: Activated Clotting Time: 256 s

## 2023-03-16 LAB — TYPE AND SCREEN
ABO/RH(D): A POS
Antibody Screen: NEGATIVE

## 2023-03-16 LAB — CBC
HCT: 38.2 % (ref 36.0–46.0)
Hemoglobin: 12.5 g/dL (ref 12.0–15.0)
MCH: 28.9 pg (ref 26.0–34.0)
MCHC: 32.7 g/dL (ref 30.0–36.0)
MCV: 88.4 fL (ref 80.0–100.0)
Platelets: 192 10*3/uL (ref 150–400)
RBC: 4.32 MIL/uL (ref 3.87–5.11)
RDW: 12.1 % (ref 11.5–15.5)
WBC: 4.7 10*3/uL (ref 4.0–10.5)
nRBC: 0 % (ref 0.0–0.2)

## 2023-03-16 LAB — BASIC METABOLIC PANEL
Anion gap: 7 (ref 5–15)
BUN: 15 mg/dL (ref 8–23)
CO2: 23 mmol/L (ref 22–32)
Calcium: 8.8 mg/dL — ABNORMAL LOW (ref 8.9–10.3)
Chloride: 111 mmol/L (ref 98–111)
Creatinine, Ser: 1.11 mg/dL — ABNORMAL HIGH (ref 0.44–1.00)
GFR, Estimated: 52 mL/min — ABNORMAL LOW (ref >60)
Glucose, Bld: 146 mg/dL — ABNORMAL HIGH (ref 70–99)
Potassium: 4.3 mmol/L (ref 3.5–5.1)
Sodium: 141 mmol/L (ref 135–145)

## 2023-03-16 LAB — GLUCOSE, CAPILLARY
Glucose-Capillary: 140 mg/dL — ABNORMAL HIGH (ref 70–99)
Glucose-Capillary: 139 mg/dL — ABNORMAL HIGH (ref 70–99)
Glucose-Capillary: 126 mg/dL — ABNORMAL HIGH (ref 70–99)
Glucose-Capillary: 141 mg/dL — ABNORMAL HIGH (ref 70–99)
Glucose-Capillary: 279 mg/dL — ABNORMAL HIGH (ref 70–99)

## 2023-03-16 LAB — ABO/RH
ABO/RH(D): A POS
ABO/RH(D): A POS

## 2023-03-16 SURGERY — TRANSCAROTID ARTERY REVASCULARIZATION (TCAR)
Anesthesia: General | Site: Neck | Laterality: Right

## 2023-03-16 MED ORDER — ALUM & MAG HYDROXIDE-SIMETH 200-200-20 MG/5ML PO SUSP: 15.0000 mL | ORAL | Status: DC | PRN

## 2023-03-16 MED ORDER — LIDOCAINE 2% (20 MG/ML) 5 ML SYRINGE
INTRAMUSCULAR | Status: AC
  Filled 2023-03-16: qty 5

## 2023-03-16 MED ORDER — ORAL CARE MOUTH RINSE: 15.0000 mL | Freq: Once | OROMUCOSAL | Status: AC

## 2023-03-16 MED ORDER — IODIXANOL 320 MG/ML IV SOLN
INTRAVENOUS | Status: DC | PRN
  Administered 2023-03-16: 30 mL via INTRA_ARTERIAL

## 2023-03-16 MED ORDER — SODIUM CHLORIDE (PF) 0.9 % IJ SOLN
INTRAMUSCULAR | Status: AC
  Filled 2023-03-16: qty 10

## 2023-03-16 MED ORDER — MORPHINE SULFATE (PF) 2 MG/ML IV SOLN
2.0000 mg | INTRAVENOUS | Status: DC | PRN
  Administered 2023-03-16 – 2023-03-17 (×3): 2 mg via INTRAVENOUS
  Filled 2023-03-16 (×3): qty 1

## 2023-03-16 MED ORDER — CHLORHEXIDINE GLUCONATE 0.12 % MT SOLN: 15.0000 mL | Freq: Once | OROMUCOSAL | Status: AC

## 2023-03-16 MED ORDER — GLYCOPYRROLATE PF 0.2 MG/ML IJ SOSY
PREFILLED_SYRINGE | INTRAMUSCULAR | Status: AC
  Filled 2023-03-16: qty 1

## 2023-03-16 MED ORDER — FENTANYL CITRATE (PF) 250 MCG/5ML IJ SOLN
INTRAMUSCULAR | Status: DC | PRN
  Administered 2023-03-16 (×4): 50 ug via INTRAVENOUS

## 2023-03-16 MED ORDER — HEPARIN 6000 UNIT IRRIGATION SOLUTION
Status: DC | PRN
  Administered 2023-03-16: 1

## 2023-03-16 MED ORDER — HEPARIN SODIUM (PORCINE) 1000 UNIT/ML IJ SOLN
INTRAMUSCULAR | Status: DC | PRN
  Administered 2023-03-16: 8000 [IU] via INTRAVENOUS

## 2023-03-16 MED ORDER — MENTHOL 3 MG MT LOZG
1.0000 | LOZENGE | OROMUCOSAL | Status: DC | PRN
  Filled 2023-03-16: qty 9

## 2023-03-16 MED ORDER — DEXAMETHASONE SODIUM PHOSPHATE 10 MG/ML IJ SOLN
INTRAMUSCULAR | Status: AC
  Filled 2023-03-16: qty 1

## 2023-03-16 MED ORDER — PROTAMINE SULFATE 10 MG/ML IV SOLN
INTRAVENOUS | Status: DC | PRN
  Administered 2023-03-16: 50 mg via INTRAVENOUS

## 2023-03-16 MED ORDER — CEFAZOLIN SODIUM-DEXTROSE 2-4 GM/100ML-% IV SOLN
2.0000 g | Freq: Once | INTRAVENOUS | Status: AC
  Administered 2023-03-16: 2 g via INTRAVENOUS

## 2023-03-16 MED ORDER — LACTATED RINGERS IV SOLN: INTRAVENOUS | Status: DC

## 2023-03-16 MED ORDER — FENTANYL CITRATE (PF) 250 MCG/5ML IJ SOLN
INTRAMUSCULAR | Status: AC
  Filled 2023-03-16: qty 5

## 2023-03-16 MED ORDER — ALBUMIN HUMAN 5 % IV SOLN
12.5000 g | Freq: Once | INTRAVENOUS | Status: AC
  Administered 2023-03-16: 12.5 g via INTRAVENOUS

## 2023-03-16 MED ORDER — 0.9 % SODIUM CHLORIDE (POUR BTL) OPTIME
TOPICAL | Status: DC | PRN
  Administered 2023-03-16: 1000 mL

## 2023-03-16 MED ORDER — PHENYLEPHRINE 80 MCG/ML (10ML) SYRINGE FOR IV PUSH (FOR BLOOD PRESSURE SUPPORT)
PREFILLED_SYRINGE | INTRAVENOUS | Status: DC | PRN
  Administered 2023-03-16: 40 ug via INTRAVENOUS
  Administered 2023-03-16: 80 ug via INTRAVENOUS
  Administered 2023-03-16: 160 ug via INTRAVENOUS
  Administered 2023-03-16 (×3): 80 ug via INTRAVENOUS

## 2023-03-16 MED ORDER — FENTANYL CITRATE (PF) 100 MCG/2ML IJ SOLN
25.0000 ug | INTRAMUSCULAR | Status: DC | PRN
  Administered 2023-03-16: 25 ug via INTRAVENOUS

## 2023-03-16 MED ORDER — NITROGLYCERIN IN D5W 200-5 MCG/ML-% IV SOLN
INTRAVENOUS | Status: AC
  Filled 2023-03-16: qty 250

## 2023-03-16 MED ORDER — HEMOSTATIC AGENTS (NO CHARGE) OPTIME
TOPICAL | Status: DC | PRN
  Administered 2023-03-16: 1 via TOPICAL

## 2023-03-16 MED ORDER — SUGAMMADEX SODIUM 200 MG/2ML IV SOLN
INTRAVENOUS | Status: DC | PRN
  Administered 2023-03-16: 200 mg via INTRAVENOUS

## 2023-03-16 MED ORDER — ROCURONIUM BROMIDE 10 MG/ML (PF) SYRINGE
PREFILLED_SYRINGE | INTRAVENOUS | Status: DC | PRN
  Administered 2023-03-16: 20 mg via INTRAVENOUS
  Administered 2023-03-16: 60 mg via INTRAVENOUS

## 2023-03-16 MED ORDER — METOPROLOL TARTRATE 5 MG/5ML IV SOLN: 2.0000 mg | INTRAVENOUS | Status: DC | PRN

## 2023-03-16 MED ORDER — ENOXAPARIN SODIUM 40 MG/0.4ML IJ SOSY
40.0000 mg | PREFILLED_SYRINGE | INTRAMUSCULAR | Status: DC
Start: 2023-03-18 — End: 2023-03-19
  Administered 2023-03-18 – 2023-03-19 (×2): 40 mg via SUBCUTANEOUS
  Filled 2023-03-16 (×2): qty 0.4

## 2023-03-16 MED ORDER — LABETALOL HCL 5 MG/ML IV SOLN: 10.0000 mg | INTRAVENOUS | Status: DC | PRN

## 2023-03-16 MED ORDER — CHLORHEXIDINE GLUCONATE 0.12 % MT SOLN
OROMUCOSAL | Status: AC
  Administered 2023-03-16: 15 mL via OROMUCOSAL
  Filled 2023-03-16: qty 15

## 2023-03-16 MED ORDER — ONDANSETRON HCL 4 MG/2ML IJ SOLN
INTRAMUSCULAR | Status: DC | PRN
  Administered 2023-03-16: 4 mg via INTRAVENOUS

## 2023-03-16 MED ORDER — PROPOFOL 10 MG/ML IV BOLUS
INTRAVENOUS | Status: DC | PRN
  Administered 2023-03-16: 100 mg via INTRAVENOUS

## 2023-03-16 MED ORDER — CEFAZOLIN SODIUM-DEXTROSE 2-4 GM/100ML-% IV SOLN
INTRAVENOUS | Status: AC
  Filled 2023-03-16: qty 100

## 2023-03-16 MED ORDER — LIDOCAINE 2% (20 MG/ML) 5 ML SYRINGE
INTRAMUSCULAR | Status: DC | PRN
  Administered 2023-03-16: 80 mg via INTRAVENOUS

## 2023-03-16 MED ORDER — OXYCODONE-ACETAMINOPHEN 5-325 MG PO TABS
1.0000 | ORAL_TABLET | ORAL | Status: DC | PRN
  Administered 2023-03-16 – 2023-03-18 (×2): 1 via ORAL
  Filled 2023-03-16 (×2): qty 1

## 2023-03-16 MED ORDER — GLYCOPYRROLATE PF 0.2 MG/ML IJ SOSY
PREFILLED_SYRINGE | INTRAMUSCULAR | Status: DC | PRN
  Administered 2023-03-16 (×2): .2 mg via INTRAVENOUS

## 2023-03-16 MED ORDER — NITROGLYCERIN IN D5W 200-5 MCG/ML-% IV SOLN: INTRAVENOUS | Status: DC | PRN

## 2023-03-16 MED ORDER — ONDANSETRON HCL 4 MG/2ML IJ SOLN: 4.0000 mg | Freq: Once | INTRAMUSCULAR | Status: DC | PRN

## 2023-03-16 MED ORDER — HYDRALAZINE HCL 20 MG/ML IJ SOLN: 5.0000 mg | INTRAMUSCULAR | Status: DC | PRN

## 2023-03-16 MED ORDER — PHENYLEPHRINE HCL-NACL 20-0.9 MG/250ML-% IV SOLN
INTRAVENOUS | Status: DC | PRN
  Administered 2023-03-16: 40 ug/min via INTRAVENOUS

## 2023-03-16 MED ORDER — ONDANSETRON HCL 4 MG/2ML IJ SOLN
INTRAMUSCULAR | Status: AC
  Filled 2023-03-16: qty 2

## 2023-03-16 MED ORDER — BISACODYL 10 MG RE SUPP
10.0000 mg | Freq: Every day | RECTAL | Status: DC | PRN
  Administered 2023-03-19: 10 mg via RECTAL
  Filled 2023-03-16: qty 1

## 2023-03-16 MED ORDER — FENTANYL CITRATE (PF) 100 MCG/2ML IJ SOLN
INTRAMUSCULAR | Status: AC
  Filled 2023-03-16: qty 2

## 2023-03-16 MED ORDER — EPHEDRINE SULFATE-NACL 50-0.9 MG/10ML-% IV SOSY
PREFILLED_SYRINGE | INTRAVENOUS | Status: DC | PRN
  Administered 2023-03-16 (×2): 5 mg via INTRAVENOUS

## 2023-03-16 MED ORDER — ALBUMIN HUMAN 5 % IV SOLN
INTRAVENOUS | Status: AC
  Filled 2023-03-16: qty 250

## 2023-03-16 MED ORDER — DEXAMETHASONE SODIUM PHOSPHATE 10 MG/ML IJ SOLN
INTRAMUSCULAR | Status: DC | PRN
  Administered 2023-03-16: 5 mg via INTRAVENOUS

## 2023-03-16 MED ORDER — ADULT MULTIVITAMIN W/MINERALS CH
1.0000 | ORAL_TABLET | Freq: Every day | ORAL | Status: DC
Start: 2023-03-17 — End: 2023-03-19
  Administered 2023-03-17 – 2023-03-19 (×3): 1
  Filled 2023-03-16 (×3): qty 1

## 2023-03-16 MED ORDER — NITROGLYCERIN 0.2 MG/ML ON CALL CATH LAB
INTRAVENOUS | Status: DC | PRN
  Administered 2023-03-16: 20 ug via INTRAVENOUS
  Administered 2023-03-16: 40 ug via INTRAVENOUS

## 2023-03-16 SURGICAL SUPPLY — 37 items
BAG BANDED W/RUBBER/TAPE 36X54 (MISCELLANEOUS) ×2 IMPLANT
BAG COUNTER SPONGE SURGICOUNT (BAG) ×2 IMPLANT
CANISTER SUCT 3000ML PPV (MISCELLANEOUS) ×2 IMPLANT
CATH BALLN ENROUTE 6X35 (CATHETERS) IMPLANT
CLIP TI MEDIUM 6 (CLIP) ×2 IMPLANT
CLIP TI WIDE RED SMALL 6 (CLIP) ×2 IMPLANT
COVER DOME SNAP 22 D (MISCELLANEOUS) ×2 IMPLANT
COVER PROBE W GEL 5X96 (DRAPES) ×2 IMPLANT
DERMABOND ADVANCED .7 DNX12 (GAUZE/BANDAGES/DRESSINGS) ×2 IMPLANT
DRAPE FEMORAL ANGIO 80X135IN (DRAPES) ×2 IMPLANT
ELECT REM PT RETURN 9FT ADLT (ELECTROSURGICAL) ×2
ELECTRODE REM PT RTRN 9FT ADLT (ELECTROSURGICAL) ×2 IMPLANT
GLOVE BIO SURGEON STRL SZ7.5 (GLOVE) ×2 IMPLANT
GOWN STRL REUS W/ TWL LRG LVL3 (GOWN DISPOSABLE) ×4 IMPLANT
GOWN STRL REUS W/ TWL XL LVL3 (GOWN DISPOSABLE) ×2 IMPLANT
GUIDEWIRE ENROUTE 0.014 (WIRE) ×2 IMPLANT
HEMOSTAT SNOW SURGICEL 2X4 (HEMOSTASIS) IMPLANT
KIT BASIN OR (CUSTOM PROCEDURE TRAY) ×2 IMPLANT
KIT ENCORE 26 ADVANTAGE (KITS) ×2 IMPLANT
KIT INTRODUCER GALT 7 (INTRODUCER) ×2 IMPLANT
KIT TURNOVER KIT B (KITS) ×2 IMPLANT
PACK CAROTID (CUSTOM PROCEDURE TRAY) ×2 IMPLANT
POSITIONER HEAD DONUT 9IN (MISCELLANEOUS) ×2 IMPLANT
SET MICROPUNCTURE 5F STIFF (MISCELLANEOUS) ×2 IMPLANT
STENT TRANSCAROTID SYSTEM 9X40 (Permanent Stent) IMPLANT
SUT MNCRL AB 4-0 PS2 18 (SUTURE) ×2 IMPLANT
SUT PROLENE 5 0 C 1 24 (SUTURE) ×2 IMPLANT
SUT SILK 2 0 PERMA HAND 18 BK (SUTURE) ×2 IMPLANT
SUT VIC AB 3-0 SH 27X BRD (SUTURE) ×2 IMPLANT
SYR 10ML LL (SYRINGE) ×6 IMPLANT
SYR 20ML LL LF (SYRINGE) ×2 IMPLANT
SYR CONTROL 10ML LL (SYRINGE) IMPLANT
SYSTEM TRANSCAROTID NEUROPRTCT (MISCELLANEOUS) ×2 IMPLANT
TOWEL GREEN STERILE (TOWEL DISPOSABLE) ×2 IMPLANT
TRANSCAROTID NEUROPROTECT SYS (MISCELLANEOUS) ×2
WATER STERILE IRR 1000ML POUR (IV SOLUTION) ×2 IMPLANT
WIRE BENTSON .035X145CM (WIRE) ×2 IMPLANT

## 2023-03-16 NOTE — Progress Notes (Signed)
  Day of Surgery Note    Subjective:  waking up in recovery   Vitals:   03/16/23 1215 03/16/23 1230  BP: (!) 90/46 (!) 91/57  Pulse: 60 71  Resp: 11 15  Temp:    SpO2: 93% 93%    Incisions:   right chest incision is clean and dry Extremities:  moving all extremities Cardiac:  regular Lungs:  non labored    Assessment/Plan:  This is a 73 y.o. female who is s/p  Right TCAR  -incision is clean without hematoma -systolic pressures by cuff in the 90's and a-line in the 110's.  Will go by a-line.  Receiving 2nd albumin -ok to transfer to 4 east.  -Lovenox for DVT prophylaxis will be held until 03/18/2023 due to high risk of bleeding.  SCD's now.    Doreatha Massed, PA-C 03/16/2023 12:38 PM [phone]

## 2023-03-16 NOTE — Progress Notes (Addendum)
  Progress Note    03/16/2023 6:49 AM Hospital Day 4  Subjective:  resting in bed. No complaints or questions  Tm 99.1 HR 50's-70's NSR 110's-130's systolic 97% RA  Vitals:   03/16/23 0300 03/16/23 0320  BP:  126/65  Pulse: 63 70  Resp:  18  Temp:  98.8 F (37.1 C)  SpO2: 97% 97%    Physical Exam: General:  no distress Lungs:  non labored   CBC    Component Value Date/Time   WBC 4.7 03/16/2023 0603   RBC 4.32 03/16/2023 0603   HGB 12.5 03/16/2023 0603   HCT 38.2 03/16/2023 0603   PLT 192 03/16/2023 0603   MCV 88.4 03/16/2023 0603   MCH 28.9 03/16/2023 0603   MCHC 32.7 03/16/2023 0603   RDW 12.1 03/16/2023 0603   LYMPHSABS 1.1 03/12/2023 1500   MONOABS 0.4 03/12/2023 1500   EOSABS 0.2 03/12/2023 1500   BASOSABS 0.0 03/12/2023 1500    BMET    Component Value Date/Time   NA 141 03/16/2023 0603   K 4.3 03/16/2023 0603   CL 111 03/16/2023 0603   CO2 23 03/16/2023 0603   GLUCOSE 146 (H) 03/16/2023 0603   BUN 15 03/16/2023 0603   CREATININE 1.11 (H) 03/16/2023 0603   CREATININE 1.27 (H) 03/12/2022 1536   CALCIUM 8.8 (L) 03/16/2023 0603   GFRNONAA 52 (L) 03/16/2023 0603   GFRAA >60 12/22/2018 2235    INR    Component Value Date/Time   INR 0.9 03/12/2023 1500     Intake/Output Summary (Last 24 hours) at 03/16/2023 0649 Last data filed at 03/16/2023 0330 Gross per 24 hour  Intake 480 ml  Output 550 ml  Net -70 ml     Assessment/Plan:  73 y.o. female symptomatic right ICA stenosis for right TCAR today with Dr. Chestine Spore.  Hospital Day 4  -for OR this morning.  Questions answered. -continue npo -continue asa/statin/plavix   Doreatha Massed, PA-C [facility] [phone] 03/16/2023 6:49 AM   I have seen and evaluated the patient. I agree with the PA note as documented above.  Plan right TCAR for symptomatic high-grade stenosis >75%.  Small right parietal infarct.  Evaluated by neurology.  Discussed risk and benefits as well as  indications.  I discussed 1% perioperative stroke risk.  Discussed this is for stroke risk reduction.  All questions answered.  Cephus Shelling, MD [facility] [place] Office: [phone]

## 2023-03-16 NOTE — Progress Notes (Signed)
STROKE TEAM PROGRESS NOTE   INTERIM HISTORY/SUBJECTIVE Husband at bedside.  Patient seen in room after right TCAR procedure.  She is slightly drowsy but open eyes on voice and follows some commands.  Still has slight left facial droop.  Complaining of dry mouth.  OBJECTIVE  CBC    Component Value Date/Time   WBC 4.7 03/16/2023 0603   RBC 4.32 03/16/2023 0603   HGB 12.5 03/16/2023 0603   HCT 38.2 03/16/2023 0603   PLT 192 03/16/2023 0603   MCV 88.4 03/16/2023 0603   MCH 28.9 03/16/2023 0603   MCHC 32.7 03/16/2023 0603   RDW 12.1 03/16/2023 0603   LYMPHSABS 1.1 03/12/2023 1500   MONOABS 0.4 03/12/2023 1500   EOSABS 0.2 03/12/2023 1500   BASOSABS 0.0 03/12/2023 1500    BMET    Component Value Date/Time   NA 141 03/16/2023 0603   K 4.3 03/16/2023 0603   CL 111 03/16/2023 0603   CO2 23 03/16/2023 0603   GLUCOSE 146 (H) 03/16/2023 0603   BUN 15 03/16/2023 0603   CREATININE 1.11 (H) 03/16/2023 0603   CREATININE 1.27 (H) 03/12/2022 1536   CALCIUM 8.8 (L) 03/16/2023 0603   GFRNONAA 52 (L) 03/16/2023 0603    IMAGING past 24 hours VAS US CAROTID Result Date: 03/16/2023 Carotid Arterial Duplex Study Patient Name:  Jacqueline [name]  Date of Exam:   03/15/2023 Medical Rec #: [id number]       Accession #:    [id number] Date of Birth: 08/16/1950       Patient Gender: F Patient Age:   73 years Exam Location:  [facility] Procedure:      VAS US CAROTID Referring Phys: Graceann Congress --------------------------------------------------------------------------------  Indications:       Speech disturbance and Weakness. Risk Factors:      Hypertension, hyperlipidemia, Diabetes, prior CVA. Other Factors:     Right 75% ICA stenosis and Left <50% ICA stenosis by CTA of                    the neck. Limitations        Today's exam was limited due to the high bifurcation of the                    carotid. Comparison Study:  Prior carotid duplex indicating right 40-59% ICA stenosis and                     left 1-39% ICA stenosis done 04/23/21 Performing Technologist: Sherren Kerns RVS  Examination Guidelines: A complete evaluation includes B-mode imaging, spectral Doppler, color Doppler, and power Doppler as needed of all accessible portions of each vessel. Bilateral testing is considered an integral part of a complete examination. Limited examinations for reoccurring indications may be performed as noted.  Right Carotid Findings: +----------+--------+--------+--------+------------------+---------+           PSV cm/sEDV cm/sStenosisPlaque DescriptionComments  +----------+--------+--------+--------+------------------+---------+ CCA Prox  123     17              homogeneous                 +----------+--------+--------+--------+------------------+---------+ CCA Distal92      17              homogeneous                 +----------+--------+--------+--------+------------------+---------+ ICA Prox  197     59      40-59%  calcific          Shadowing +----------+--------+--------+--------+------------------+---------+ ICA Mid   50      12                                tortuous  +----------+--------+--------+--------+------------------+---------+ ICA Distal75      19                                tortuous  +----------+--------+--------+--------+------------------+---------+ ECA       164     41                                          +----------+--------+--------+--------+------------------+---------+ +----------+--------+-------+--------+-------------------+           PSV cm/sEDV cmsDescribeArm Pressure (mmHG) +----------+--------+-------+--------+-------------------+ [id number]                                        +----------+--------+-------+--------+-------------------+ +---------+--------+--+--------+--+ VertebralPSV cm/s38EDV cm/s11 +---------+--------+--+--------+--+  Left Carotid Findings:  +----------+--------+--------+--------+------------------+------------------+           PSV cm/sEDV cm/sStenosisPlaque DescriptionComments           +----------+--------+--------+--------+------------------+------------------+ CCA Prox  130     23                                intimal thickening +----------+--------+--------+--------+------------------+------------------+ CCA Distal91      20              heterogenous                         +----------+--------+--------+--------+------------------+------------------+ ICA Prox  102     23      1-39%   heterogenous                         +----------+--------+--------+--------+------------------+------------------+ ICA Mid   74      18                                                   +----------+--------+--------+--------+------------------+------------------+ ICA Distal77      20                                                   +----------+--------+--------+--------+------------------+------------------+ ECA       100     13                                                   +----------+--------+--------+--------+------------------+------------------+ +----------+--------+--------+--------+-------------------+           PSV cm/sEDV cm/sDescribeArm Pressure (mmHG) +----------+--------+--------+--------+-------------------+ Subclavian100                                         +----------+--------+--------+--------+-------------------+ +---------+--------+--+--------+-+  VertebralPSV cm/s49EDV cm/s9 +---------+--------+--+--------+-+   Summary: Right Carotid: Velocities in the right ICA are consistent with a 40-59%                stenosis. Velocities may be underestimated secondary to acoustic                shadowing from plaque. Left Carotid: Velocities in the left ICA are consistent with a 1-39% stenosis.               Velocities may be underestimated secondary to acoustic shadowing                from plaque. Vertebrals:  Bilateral vertebral arteries demonstrate antegrade flow. Subclavians: Normal flow hemodynamics were seen in bilateral subclavian              arteries. *See table(s) above for measurements and observations.  Electronically signed by Sherald Hess MD on 03/16/2023 at 11:45:34 AM.    Final    HYBRID OR IMAGING ([facility] ONLY) Result Date: 03/16/2023 There is no interpretation for this exam.  This order is for images obtained during a surgical procedure.  Please See "Surgeries" Tab for more information regarding the procedure.     Vitals:   03/16/23 1315 03/16/23 1345 03/16/23 1400 03/16/23 1500  BP: (!) 105/50 (!) 110/57 (!) 113/59 (!) 108/56  Pulse: 61 70 65 64  Resp: 11 16 17 13   Temp: 97.6 F (36.4 C) (!) 97.5 F (36.4 C)    TempSrc:  Oral    SpO2: 99% 95%    Weight:      Height:         PHYSICAL EXAM General: Awake, alert, well-nourished, well-developed patient in no acute distress Psych:  Mood and affect appropriate for situation.  Calm and cooperative CV: Regular rate and rhythm on monitor Respiratory:  Regular, unlabored respirations on room air GI: Abdomen soft and nontender   NEURO:  Mental Status: Awake, alert, fully oriented.  She follows all commands.  Able to give clear and coherent history. Speech/Language: No dysarthria.  No aphasia.  Naming, repetition, fluency, and comprehension intact.  Cranial Nerves:  II: PERRL. Visual fields full.  III, IV, VI: EOMI. Eyelids elevate symmetrically.  V: Sensation is intact to light touch and symmetrical to face.  VII: Slight left facial droop  VIII: hearing intact to voice. IX, X: Palate elevates symmetrically. Phonation is normal.  YQ:[id number] shrug 5/5. XII: tongue is midline without fasciculations. Motor:  BUE: 4/5, no drift BLE: 4/5, no drift Tone: is normal and bulk is normal Sensation- Intact to light touch bilaterally. Coordination: FTN intact bilaterally Gait- deferred  Most Recent NIH:  7    ASSESSMENT/PLAN Jacqueline [name] is a 73 y.o. female with PMH significant for DM2, obesity, HTN, HDL who presented 1/30 to OSH due to waxing and waning L facial droop, numbness, lethargy, slurred speech and headache. CTH negative, CTA no LVO. She was given TNK, not a candidate for EVT due to no LVO. NIH on Admission to [facility]: 6  Stroke:  right parietal small infarct s/p TNK, etiology: Large vessel disease from right ICA stenosis Code Stroke CT head:  No acute abnormality.  ASPECTS 10. CTA head & neck: No LVO, right ICA 75% stenosis progressed from before Repeat Ct Head:   Stable MRI: small right CR/SO infarct 2D Echo: EF 55-60% Carotid US R ICA 40-59% stenosis LDL 126 HgbA1c 7.3 UDS neg VTE prophylaxis - lovenox clopidogrel 75 mg daily prior to  admission, continue aspirin 81 mg daily and clopidogrel 75 mg daily. Continue on discharge post TCAR Therapy recommendations: Outpatient PT, OT, SLP Disposition:  pending  Hx of Stroke/TIA 07/2018 presented with headache and left arm/face numbness.  CT no acute abnormality.  CTA head and neck unremarkable.  MRI showed no acute infarct, old deep white matter infarcts.  EF 60 to 65%.  LDL 119, A1c 11.2.  Discharged on DAPT and Lipitor 40. 09/2020 admitted for left-sided numbness.  MRI showed right MCA/ACA periventricular white matter small infarcts.  CT head and neck bilateral ICA bulb atherosclerosis with right ICA 40% stenosis.  LDL 134, A1c 7.9.  Discharged on DAPT and Crestor 40.  Right ICA stenosis CTA neck in 09/2020 right ICA 40% stenosis Current CTA neck showed right ICA 75% stenosis progressed from before Vascular Surgery Dr. Randie Heinz consulted Carotid US right ICA 40 to 59% stenosis. Status post right TCAR today with Dr. Chestine Spore  Hypertension Home meds:  losartan 50mg  Stable  Long-term BP goal normotensive  Hyperlipidemia Home meds:  Crestor 40mg , resumed in hospital LDL 126, goal < 70 Add Zetia, continue Crestor 40 Continue statin  and Zetia at discharge  Diabetes type II Uncontrolled Home meds:  metformin 1000mg , Lantus HgbA1c 7.4, goal < 7.0 CBGs SSI Recommend close follow-up with PCP for better DM control  Headache, improved History of migraine on home Topamax 50 twice daily Continue monitoring Fioricet PRN  Other Stroke Risk Factors Advanced age  Hospital day # 4    Marvel Plan, MD PhD Stroke Neurology 03/16/2023 3:54 PM

## 2023-03-16 NOTE — Anesthesia Procedure Notes (Signed)
Arterial Line Insertion Start/End2/04/2023 9:30 AM, 03/16/2023 9:35 AM Performed by: [name], MD, Thomasene Ripple, CRNA, CRNA  Patient location: Pre-op. Preanesthetic checklist: patient identified, IV checked, site marked, risks and benefits discussed, surgical consent, monitors and equipment checked, pre-op evaluation, timeout performed and anesthesia consent Lidocaine 1% used for infiltration Left, radial was placed Catheter size: 20 G Hand hygiene performed  and maximum sterile barriers used   Attempts: 1 Procedure performed without using ultrasound guided technique. Following insertion, dressing applied and Biopatch. Post procedure assessment: normal and unchanged  Patient tolerated the procedure well with no immediate complications. Additional procedure comments: Placed by Murray Hodgkins, SRNA.

## 2023-03-16 NOTE — Care Management Important Message (Signed)
Important Message  Patient Details  Name: RAYNI NEMITZ MRN: [id number] Date of Birth: [birth year]   Important Message Given:  Yes - Medicare IM   Patient left prior to IM delivery will mail to the patient home address.  Kenard Morawski 03/16/2023, 3:31 PM

## 2023-03-16 NOTE — Discharge Instructions (Signed)
   [facility]  Discharge Instructions   Carotid Surgery  Please refer to the following instructions for your post-procedure care. Your surgeon or physician assistant will discuss any changes with you.  Activity  You are encouraged to walk as much as you can. You can slowly return to normal activities but must avoid strenuous activity and heavy lifting until your doctor tell you it's okay. Avoid activities such as vacuuming or swinging a golf club. You can drive after one week if you are comfortable and you are no longer taking prescription pain medications. It is normal to feel tired for serval weeks after your surgery. It is also normal to have difficulty with sleep habits, eating, and bowel movements after surgery. These will go away with time.  Bathing/Showering  Shower daily after you go home. Do not soak in a bathtub, hot tub, or swim until the incision heals completely.  Incision Care  Shower every day. Clean your incision with mild soap and water. Pat the area dry with a clean towel. You do not need a bandage unless otherwise instructed. Do not apply any ointments or creams to your incision. You may have skin glue on your incision. Do not peel it off. It will come off on its own in about one week. Your incision may feel thickened and raised for several weeks after your surgery. This is normal and the skin will soften over time.   For Men Only: It's okay to shave around the incision but do not shave the incision itself for 2 weeks. It is common to have numbness under your chin that could last for several months.  Diet  Resume your normal diet. There are no special food restrictions following this procedure. A low fat/low cholesterol diet is recommended for all patients with vascular disease. In order to heal from your surgery, it is CRITICAL to get adequate nutrition. Your body requires vitamins, minerals, and protein. Vegetables are the best source of  vitamins and minerals. Vegetables also provide the perfect balance of protein. Processed food has little nutritional value, so try to avoid this.  Medications  Resume taking all of your medications unless your doctor or physician assistant tells you not to. If your incision is causing pain, you may take over-the- counter pain relievers such as acetaminophen (Tylenol). If you were prescribed a stronger pain medication, please be aware these medications can cause nausea and constipation. Prevent nausea by taking the medication with a snack or meal. Avoid constipation by drinking plenty of fluids and eating foods with a high amount of fiber, such as fruits, vegetables, and grains.   Do not take Tylenol if you are taking prescription pain medications.  Follow Up  Our office will schedule a follow up appointment 2-3 weeks following discharge.  Please call us immediately for any of the following conditions  . Increased pain, redness, drainage (pus) from your incision site. . Fever of 101 degrees or higher. . If you should develop stroke (slurred speech, difficulty swallowing, weakness on one side of your body, loss of vision) you should call 911 and go to the nearest emergency room. .  Reduce your risk of vascular disease:  . Stop smoking. If you would like help call [facility] at 1-800-QUIT-NOW [phone])863-3115) or [place] at [phone]. . Manage your cholesterol . Maintain a desired weight . Control your diabetes . Keep your blood pressure down .  If you have any questions, please call the office at [phone].

## 2023-03-16 NOTE — Progress Notes (Signed)
OT Cancellation Note  Patient Details Name: Jacqueline [name] MRN: [id number] DOB: 1950-08-13   Cancelled Treatment:    Reason Eval/Treat Not Completed: Patient at procedure or test/ unavailable (revascularization sx, OT to follow up post-op)  Donia Pounds 03/16/2023, 10:43 AM

## 2023-03-16 NOTE — Progress Notes (Signed)
Initial Nutrition Assessment  DOCUMENTATION CODES:   Not applicable  INTERVENTION:  Advance diet per SLP recommendation   Once diet advanced, add Ensure Max po BID, each supplement provides 150 kcal and 30 grams of protein.   MVI with minerals daily  Discussed protein sources she can add to meals to maintain muscle stores  NUTRITION DIAGNOSIS:   Increased nutrient needs related to acute illness (post op recovery) as evidenced by estimated needs.  GOAL:   Patient will meet greater than or equal to 90% of their needs  MONITOR:   Diet advancement, Weight trends, Supplement acceptance  REASON FOR ASSESSMENT:   Malnutrition Screening Tool    ASSESSMENT:   Pt presented to ED with face/mouth numbness, slurred speech, and left facial droop and admitted for acute ischemic stroke. Pt with PMH of HTN, HDL, obesity, DM2.   1/30: Presented to ED and TNK given. Admitted to ICU 1/31: Pt had change in status; failed yale swallow 2/2: Transferrd to floor 2/3: Right TCAR  Pt's husband provided all nutrition history at bedside. Pt was not alert or awake during visit. Pt has been off and on NPO/Heart healthy diet since admission due to failed yale swallow and procedures. Per chart review pt has eaten an average of 38% of her meals on heart healthy diet. Pt husband reports pt's appetite has been good but isn't as great as it used to be. He began noticing changes a few months ago while she was trying to lose weight, but is unsure of what has caused the appetite changes. He reports she has been trying to lose weight for the past year in hopes that the weight loss will help her diabetes. She has been losing weight by cutting back on the quantity of food she's eating. He reports that pt's doctor wants her to try Ozempic but recent [place] obligations out of the [place] have prevented her from starting the medication. He reports she eats 3 meals a day most days which include B: Cereal, L sandwich, and  D: takeout or meat and vegetable or salads. He reports that he does not feel like she gets adequate protein at meals. We discussed protein sources they can add to meals. He reports she will not like the Ensure drinks due to the calorie content but will try an Ensure max. He reports she takes vitamin D3 at home and is open to her being on a multivitamin while here.  Pt's husband estimates she has lost about 20 lbs in the past year and is unsure of a usual body weight. Per chart review pt is currently 167 lbs and has been between 173-178 lbs over the past year. Pt has had a 5.5% weight loss over the last 3 months which is not clinically significant.   SLP consulted for swallow evaluation.  Meds: Novolog 0-5 units daily, Senokot PRN  Labs: Glucose 126-172, A1C 7.3  NUTRITION - FOCUSED PHYSICAL EXAM:  Some deficits noted but feel this is more associated with aging and intentional weight loss.  Flowsheet Row Most Recent Value  Orbital Region No depletion  Upper Arm Region Mild depletion  Thoracic and Lumbar Region No depletion  Buccal Region Mild depletion  Temple Region Mild depletion  Clavicle Bone Region No depletion  Clavicle and Acromion Bone Region Mild depletion  Scapular Bone Region Unable to assess  [unable to sit up]  Dorsal Hand No depletion  Patellar Region Moderate depletion  Anterior Thigh Region Moderate depletion  Posterior Calf Region Mild depletion  Edema (RD Assessment) None  Hair Reviewed  Eyes Reviewed  Mouth Reviewed  Skin Reviewed  Nails Reviewed       Diet Order:   Diet Order             Diet NPO time specified  Diet effective now                   EDUCATION NEEDS:   Education needs have been addressed  Skin:  Skin Assessment: Reviewed RN Assessment  Last BM:  1/30  Height:   Ht Readings from Last 1 Encounters:  03/15/23 5\' 5"  (1.651 m)    Weight:   Wt Readings from Last 1 Encounters:  03/15/23 76 kg    Ideal Body Weight:  56.8  kg  BMI:  Body mass index is 27.88 kg/m.  Estimated Nutritional Needs:   Kcal:  1700-1900  Protein:  85-100 g  Fluid:  1.7-1.9 L    Maceo Pro, MS Dietetic Intern

## 2023-03-16 NOTE — Transfer of Care (Signed)
Immediate Anesthesia Transfer of Care Note  Patient: Jacqueline [name]  Procedure(s) Performed: Zada Finders ARTERY REVASCULARIZATION (Right: Neck) ULTRASOUND GUIDANCE FOR VASCULAR ACCESS (Left: Groin)  Patient Location: PACU  Anesthesia Type:General  Level of Consciousness: awake, alert , and oriented  Airway & Oxygen Therapy: Patient Spontanous Breathing and Patient connected to face mask oxygen  Post-op Assessment: Report given to RN and Post -op Vital signs reviewed and stable  Post vital signs: Reviewed and stable  Last Vitals:  Vitals Value Taken Time  BP 98/51 03/16/23 1138  Temp    Pulse 63 03/16/23 1144  Resp 13 03/16/23 1144  SpO2 94 % 03/16/23 1144  Vitals shown include unfiled device data.  Last Pain:  Vitals:   03/16/23 0921  TempSrc:   PainSc: 0-No pain      Patients Stated Pain Goal: 0 (03/13/23 0800)  Complications: No notable events documented.

## 2023-03-16 NOTE — Anesthesia Preprocedure Evaluation (Addendum)
Anesthesia Evaluation  Patient identified by MRN, date of birth, ID band Patient awake    Reviewed: Allergy & Precautions, NPO status , Patient's Chart, lab work & pertinent test results  Airway Mallampati: II  TM Distance: >3 FB Neck ROM: Full    Dental  (+) Dental Advisory Given, Partial Upper, Chipped,    Pulmonary neg pulmonary ROS   Pulmonary exam normal breath sounds clear to auscultation       Cardiovascular hypertension, Pt. on medications + Peripheral Vascular Disease (right ICA stenosis)  Normal cardiovascular exam Rhythm:Regular Rate:Normal     Neuro/Psych  Headaches PSYCHIATRIC DISORDERS Anxiety Depression    TIA Neuromuscular disease CVA, Residual Symptoms    GI/Hepatic Neg liver ROS,GERD  Medicated,,  Endo/Other  diabetes, Type 2, Oral Hypoglycemic Agents    Renal/GU      Musculoskeletal negative musculoskeletal ROS (+)    Abdominal   Peds  Hematology  (+) Blood dyscrasia (Plavix)   Anesthesia Other Findings Day of surgery medications reviewed with the patient.  Reproductive/Obstetrics                             Anesthesia Physical Anesthesia Plan  ASA: 3  Anesthesia Plan: General   Post-op Pain Management: Tylenol PO (pre-op)*   Induction: Intravenous  PONV Risk Score and Plan: 3 and Dexamethasone and Ondansetron  Airway Management Planned: Oral ETT  Additional Equipment: Arterial line  Intra-op Plan:   Post-operative Plan: Extubation in OR  Informed Consent: I have reviewed the patients History and Physical, chart, labs and discussed the procedure including the risks, benefits and alternatives for the proposed anesthesia with the patient or authorized representative who has indicated his/her understanding and acceptance.     Dental advisory given  Plan Discussed with: CRNA  Anesthesia Plan Comments: (2nd PIV)       Anesthesia Quick Evaluation

## 2023-03-16 NOTE — Progress Notes (Signed)
Patient transferred to vascular surgery.  Family at bedside. Belongings with family.

## 2023-03-16 NOTE — Anesthesia Postprocedure Evaluation (Signed)
Anesthesia Post Note  Patient: Jacqueline [name]  Procedure(s) Performed: Zada Finders ARTERY REVASCULARIZATION (Right: Neck) ULTRASOUND GUIDANCE FOR VASCULAR ACCESS (Left: Groin)     Patient location during evaluation: PACU Anesthesia Type: General Level of consciousness: awake and alert Pain management: pain level controlled Vital Signs Assessment: post-procedure vital signs reviewed and stable Respiratory status: spontaneous breathing, nonlabored ventilation and respiratory function stable Cardiovascular status: blood pressure returned to baseline and stable Postop Assessment: no apparent nausea or vomiting Anesthetic complications: no   No notable events documented.  Last Vitals:  Vitals:   03/16/23 1500 03/16/23 1600  BP: (!) 108/56 115/62  Pulse: 64 66  Resp: 13 14  Temp:  36.6 C  SpO2:      Last Pain:  Vitals:   03/16/23 1600  TempSrc: Oral  PainSc:                  [name]

## 2023-03-16 NOTE — Op Note (Signed)
Date: March 16, 2023  Preoperative diagnosis: Symptomatic right ICA stenosis greater than 75% with right brain stroke  Postoperative diagnosis: Same  Procedure: 1.  Ultrasound-guided access left common femoral vein for delivery of TCAR venous sheath 2.  Transcatheter placement of cervical carotid artery stent on the right including angioplasty with distal embolic protection using flow reversal (right TCAR)  Surgeon: Dr. Cephus Shelling, MD  Assistant: Doreatha Massed, PA  Indications: 73 year old female seen with symptomatic right ICA stenosis over 75% with right parietal infarct on MRI.  She presents for right TCAR after evaluation by my partner Dr. Randie Heinz.  Risks benefits discussed.  An assistant was needed given the complexity the case and also for cutdown on the common carotid artery and access in the artery for angioplasty and stent placement.  Findings: Ultrasound-guided access left common femoral vein for delivery of TCAR venous sheath.  Transverse incision over the right clavicle with a cutdown on the common carotid artery that was accessed percutaneously.  After active flow reversal the 75% stenosis in the proximal right ICA was crossed antegrade with distal embolic protection using flow reversal.  This was angioplastied with a 6 mm x 35 mm angioplasty balloon and stented with a 9 mm x 40 mm and route stent.  Widely patent at completion.  Flow reversal time: 7 minutes  Anesthesia: General  Details: Patient was taken to the operating room after informed consent was obtained.  Placed on the operative table in supine position.  General endotracheal anesthesia was induced.  Shoulder roll was placed under the patient and she was rotated to the left.  The right neck was marked for the carotid artery.  Bilateral groins and the right neck were then prepped and draped in standard sterile fashion.  Antibiotics were given and timeout performed.  Initially started in the left groin where I  used sterile ultrasound to identify the left common femoral vein, it was patent, an image saved.  This was accessed with micro access needle and placed a microwire and micro sheath.  I then inserted a Bentson wire and then the TCAR venous sheath.  I then went to the right neck and made a transverse incision 1 fingerbreadth above the clavicle.  Dissected down through the platysma with Bovie cautery and used small wheat Lander retractors.  I divided between the heads of the sternocleidomastoid and ultimately mobilized the internal jugular vein lateral and identified the common carotid artery.  The vagus nerve was preserved.  I dissected out the common carotid and this was controlled with large vessel loop and umbilical tape.  Patient was given 100 units per kilogram IV heparin.  I placed pursestring in the anterior wall of the common carotid with 5-0 Prolene.  This was accessed with a micro access needle and placed a microwire and a microsheath.  I then got a right carotid angiogram to identify the carotid bifurcation.  I advanced a J-wire to just below the carotid bifurcation and then upsized to arterial working sheath in the common carotid artery.  We then connected the filter to the sheath in the groin and went on passive flow reversal.  The right carotid angiogram was repeated again to identify the internal carotid artery and the stenosis in the proximal vessel.  We then went on active clamp once we had a therapeutic ACT above 250 and clamped the common carotid artery in the base of the wound.  I confirmed that I had good active flow reversal.  The lesion was  crossed with a wire and then the proximal ICA lesion was angioplastied with a 6 mm x 35 mm angioplasty balloon and then stented with a 9 mm x 40 mm Enroute stent with the help of my assistant.  Total flow reversal time was 7 minutes.  We allowed 2 more minutes of flow reversal took a final angiogram that showed widely patent stent.  Wire was removed and we  came off active clamp.  The sheath was removed from the common carotid artery tying down the pursestring with good hemostasis.  We had good Doppler flow.  Protamine was given.  I removed the sheath in the left groin and held manual pressure with good hemostasis.  The neck was irrigated out we used Surgicel snow and this was closed with 3-0 Vicryl 4-0 Monocryl and Dermabond.  Taken to recovery in stable condition and neurologically intact.  Complication: None  Condition: Stable  Cephus Shelling, MD [facility] [place] Office: [phone]   Cephus Shelling

## 2023-03-16 NOTE — Anesthesia Procedure Notes (Signed)
Procedure Name: Intubation Date/Time: 03/16/2023 10:05 AM  Performed by: Thomasene Ripple, CRNAPre-anesthesia Checklist: Patient identified, Emergency Drugs available, Suction available and Patient being monitored Patient Re-evaluated:Patient Re-evaluated prior to induction Oxygen Delivery Method: Circle system utilized Preoxygenation: Pre-oxygenation with 100% oxygen Induction Type: IV induction Ventilation: Two handed mask ventilation required and Mask ventilation without difficulty Laryngoscope Size: Mac and 3 Grade View: Grade I Tube type: Oral Number of attempts: 1 Airway Equipment and Method: Stylet Placement Confirmation: ETT inserted through vocal cords under direct vision, positive ETCO2 and breath sounds checked- equal and bilateral Secured at: 23 cm Tube secured with: Tape Dental Injury: Teeth and Oropharynx as per pre-operative assessment

## 2023-03-16 NOTE — Progress Notes (Signed)
SLP Cancellation Note  Patient Details Name: Jacqueline [name] MRN: [id number] DOB: 04-20-1950   Cancelled treatment:       Reason Eval/Treat Not Completed: Medical issues which prohibited therapy. Order received for swallow eval. Pt NPO for OR this am. Will f/u as able.     Mahala Menghini., M.A. CCC-SLP Acute Rehabilitation Services Office [phone]  Secure chat preferred  03/16/2023, 8:54 AM

## 2023-03-16 NOTE — Plan of Care (Signed)
  Problem: Education: Goal: Knowledge of General Education information will improve Description: Including pain rating scale, medication(s)/side effects and non-pharmacologic comfort measures Outcome: Progressing   Problem: Clinical Measurements: Goal: Cardiovascular complication will be avoided Outcome: Progressing   Problem: Activity: Goal: Risk for activity intolerance will decrease Outcome: Progressing   Problem: Safety: Goal: Ability to remain free from injury will improve Outcome: Progressing   Problem: Ischemic Stroke/TIA Tissue Perfusion: Goal: Complications of ischemic stroke/TIA will be minimized 03/16/2023 0059 by Laurence Aly, RN Outcome: Progressing 03/16/2023 0057 by Laurence Aly, RN Outcome: Progressing

## 2023-03-17 ENCOUNTER — Encounter (HOSPITAL_COMMUNITY): Payer: Self-pay | Admitting: Vascular Surgery

## 2023-03-17 DIAGNOSIS — I639 Cerebral infarction, unspecified: Secondary | ICD-10-CM | POA: Diagnosis not present

## 2023-03-17 LAB — GLUCOSE, CAPILLARY
Glucose-Capillary: 82 mg/dL (ref 70–99)
Glucose-Capillary: 110 mg/dL — ABNORMAL HIGH (ref 70–99)
Glucose-Capillary: 124 mg/dL — ABNORMAL HIGH (ref 70–99)
Glucose-Capillary: 115 mg/dL — ABNORMAL HIGH (ref 70–99)

## 2023-03-17 LAB — BASIC METABOLIC PANEL
Anion gap: 9 (ref 5–15)
BUN: 12 mg/dL (ref 8–23)
CO2: 18 mmol/L — ABNORMAL LOW (ref 22–32)
Calcium: 8.8 mg/dL — ABNORMAL LOW (ref 8.9–10.3)
Chloride: 112 mmol/L — ABNORMAL HIGH (ref 98–111)
Creatinine, Ser: 0.97 mg/dL (ref 0.44–1.00)
GFR, Estimated: >60 mL/min (ref >60)
Glucose, Bld: 94 mg/dL (ref 70–99)
Potassium: 3.8 mmol/L (ref 3.5–5.1)
Sodium: 139 mmol/L (ref 135–145)

## 2023-03-17 LAB — CBC
HCT: 31.2 % — ABNORMAL LOW (ref 36.0–46.0)
Hemoglobin: 10.3 g/dL — ABNORMAL LOW (ref 12.0–15.0)
MCH: 29.3 pg (ref 26.0–34.0)
MCHC: 33 g/dL (ref 30.0–36.0)
MCV: 88.6 fL (ref 80.0–100.0)
Platelets: 170 10*3/uL (ref 150–400)
RBC: 3.52 MIL/uL — ABNORMAL LOW (ref 3.87–5.11)
RDW: 12.3 % (ref 11.5–15.5)
WBC: 5.9 10*3/uL (ref 4.0–10.5)
nRBC: 0 % (ref 0.0–0.2)

## 2023-03-17 MED ORDER — MIDODRINE HCL 5 MG PO TABS
5.0000 mg | ORAL_TABLET | Freq: Three times a day (TID) | ORAL | Status: DC
  Administered 2023-03-17 (×2): 5 mg via ORAL
  Filled 2023-03-17 (×2): qty 1

## 2023-03-17 MED ORDER — SODIUM CHLORIDE 0.9 % IV SOLN: INTRAVENOUS | Status: AC

## 2023-03-17 MED ORDER — PSEUDOEPHEDRINE HCL 30 MG PO TABS
60.0000 mg | ORAL_TABLET | Freq: Four times a day (QID) | ORAL | Status: DC
  Administered 2023-03-17: 60 mg via ORAL
  Filled 2023-03-17 (×3): qty 1
  Filled 2023-03-17: qty 2

## 2023-03-17 MED ORDER — MIDODRINE HCL 5 MG PO TABS
10.0000 mg | ORAL_TABLET | Freq: Three times a day (TID) | ORAL | Status: DC
Start: 2023-03-17 — End: 2023-03-18
  Administered 2023-03-17 – 2023-03-18 (×2): 10 mg via ORAL
  Filled 2023-03-17 (×2): qty 2

## 2023-03-17 MED ORDER — SODIUM CHLORIDE 0.9 % IV BOLUS
500.0000 mL | Freq: Once | INTRAVENOUS | Status: AC
  Administered 2023-03-17: 500 mL via INTRAVENOUS

## 2023-03-17 MED ORDER — ENSURE MAX PROTEIN PO LIQD
11.0000 [oz_av] | Freq: Every day | ORAL | Status: DC
  Filled 2023-03-17 (×4): qty 330

## 2023-03-17 NOTE — Progress Notes (Addendum)
 Paged Dr. Voncile regarding patients SBP's in 90s, MAP >60 and HR between 45-50. Otherwise, no other changes noted during full reassessment.   Was ordered to give 500ml NS Bolus and then run NS @ 60 ml/hr.  Will continue to monitor.   03/17/23 0245  Vitals  BP (!) 91/49  MAP (mmHg) (!) 62  BP Location Right Arm  BP Method Automatic  Patient Position (if appropriate) Lying  Pulse Rate (!) 51  Resp 18  MEWS COLOR  MEWS Score Color Green  Art Line  Arterial Line BP 106/42  Arterial Line MAP (mmHg) 61 mmHg  Arterial Line Location Left radial  MEWS Score  MEWS Temp 0  MEWS Systolic 1  MEWS Pulse 0  MEWS RR 0  MEWS LOC 0  MEWS Score 1

## 2023-03-17 NOTE — Progress Notes (Signed)
Overnight neurology note  Called by RN - HR 45-50s. SBP soft - 90s, MAP>60. No neuro changes.  Ordering NS bolus 500cc. Then start NS at 60cc/hr  Milon Dikes, MD Neurology

## 2023-03-17 NOTE — Progress Notes (Signed)
 [facility]   Subjective  -blood pressure soft this morning   Objective 111/83 (!) 51 97.7 F (36.5 C) (Oral) 18 99%  Intake/Output Summary (Last 24 hours) at 03/17/2023 0645 Last data filed at 03/17/2023 0340 Gross per 24 hour  Intake 1331.27 ml  Output 820 ml  Net 511.27 ml    Right neck incision healing Left groin access site without hematoma Grossly neurologically intact  Laboratory Lab Results: Recent Labs    03/16/23 0603 03/17/23 0403  WBC 4.7 5.9  HGB 12.5 10.3*  HCT 38.2 31.2*  PLT 192 170   BMET Recent Labs    03/16/23 0603 03/17/23 0403  NA 141 139  K 4.3 3.8  CL 111 112*  CO2 23 18*  GLUCOSE 146* 94  BUN 15 12  CREATININE 1.11* 0.97  CALCIUM  8.8* 8.8*    COAG Lab Results  Component Value Date   INR 0.9 03/12/2023   INR 0.9 09/12/2020   INR 1.0 07/14/2018   No results found for: PTT  Assessment/Planning:  Postop day 1 status post right TCAR for 75% symptomatic right ICA stenosis with parietal infarct.  Doing well this morning and is grossly neurologically intact.  All her incisions look great.  Blood pressure soft with systolics in the 90s.  Will order Sudafed and discussed this is likely related to baroreceptors following her surgery.  Aspirin  statin Plavix  at discharge.  Plan follow-up in 1 month with carotid duplex that we will schedule.  If blood pressure more stable throughout the day okay with discharge later.  Lonni JINNY Gaskins 03/17/2023 6:45 AM --

## 2023-03-17 NOTE — Progress Notes (Addendum)
 STROKE TEAM PROGRESS NOTE   INTERIM HISTORY/SUBJECTIVE TCAR yesterday, BP and HR low this morning. Received 1 dose of sudafed and we will start midodrine  today. Hope to discharge tomorrow.   OBJECTIVE  CBC    Component Value Date/Time   WBC 5.9 03/17/2023 0403   RBC 3.52 (L) 03/17/2023 0403   HGB 10.3 (L) 03/17/2023 0403   HCT 31.2 (L) 03/17/2023 0403   PLT 170 03/17/2023 0403   MCV 88.6 03/17/2023 0403   MCH 29.3 03/17/2023 0403   MCHC 33.0 03/17/2023 0403   RDW 12.3 03/17/2023 0403   LYMPHSABS 1.1 03/12/2023 1500   MONOABS 0.4 03/12/2023 1500   EOSABS 0.2 03/12/2023 1500   BASOSABS 0.0 03/12/2023 1500    BMET    Component Value Date/Time   NA 139 03/17/2023 0403   K 3.8 03/17/2023 0403   CL 112 (H) 03/17/2023 0403   CO2 18 (L) 03/17/2023 0403   GLUCOSE 94 03/17/2023 0403   BUN 12 03/17/2023 0403   CREATININE 0.97 03/17/2023 0403   CREATININE 1.27 (H) 03/12/2022 1536   CALCIUM  8.8 (L) 03/17/2023 0403   GFRNONAA >60 03/17/2023 0403    IMAGING past 24 hours No results found.    Vitals:   03/17/23 0245 03/17/23 0338 03/17/23 0729 03/17/23 0854  BP: (!) 91/49 111/83 (!) 106/52 (!) 111/52  Pulse: (!) 51  (!) 48   Resp: 18  16   Temp:  97.7 F (36.5 C) 97.9 F (36.6 C) 98.1 F (36.7 C)  TempSrc:  Oral Oral Oral  SpO2:  99%  96%  Weight:      Height:         PHYSICAL EXAM General: Awake, alert, well-nourished, well-developed patient in no acute distress Psych:  Mood and affect appropriate for situation.  Calm and cooperative CV: Regular rate and rhythm on monitor Respiratory:  Regular, unlabored respirations on room air GI: Abdomen soft and nontender   NEURO:  Mental Status: Awake, alert, fully oriented.  Jacqueline [name] follows all commands.  Able to give clear and coherent history. Speech/Language: No dysarthria.  No aphasia.  Naming, repetition, fluency, and comprehension intact.  Cranial Nerves:  II: PERRL. Visual fields full.  III, IV, VI: EOMI. Eyelids  elevate symmetrically.  V: Sensation is intact to light touch and symmetrical to face.  VII: Slight left facial droop  VIII: hearing intact to voice. IX, X: Palate elevates symmetrically. Phonation is normal.  KP:Dynloizm shrug 5/5. XII: tongue is midline without fasciculations. Motor:  BUE: 4/5, no drift BLE: 4/5, no drift Tone: is normal and bulk is normal Sensation- Intact to light touch bilaterally. Coordination: FTN intact bilaterally Gait- deferred  Most Recent NIH: 0    ASSESSMENT/PLAN Jacqueline [name] is a 73 y.o. female with PMH significant for DM2, obesity, HTN, HDL who presented 1/30 to OSH due to waxing and waning L facial droop, numbness, lethargy, slurred speech and headache. CTH negative, CTA no LVO. Jacqueline [name] was given TNK, not a candidate for EVT due to no LVO. NIH on Admission to [facility]: 6  Stroke:  right parietal small infarct s/p TNK, etiology: Large vessel disease from right ICA stenosis Code Stroke CT head:  No acute abnormality.  ASPECTS 10. CTA head & neck: No LVO, right ICA 75% stenosis progressed from before Repeat Ct Head:   Stable MRI: small right CR/SO infarct 2D Echo: EF 55-60% Carotid US  R ICA 40-59% stenosis LDL 126 HgbA1c 7.3 UDS neg VTE prophylaxis - lovenox  clopidogrel  75 mg  daily prior to admission, continue aspirin  81 mg daily and clopidogrel  75 mg daily. Continue on discharge post TCAR Therapy recommendations: Outpatient PT, OT, SLP Disposition:  pending  Hx of Stroke/TIA 07/2018 presented with headache and left arm/face numbness.  CT no acute abnormality.  CTA head and neck unremarkable.  MRI showed no acute infarct, old deep white matter infarcts.  EF 60 to 65%.  LDL 119, A1c 11.2.  Discharged on DAPT and Lipitor 40. 09/2020 admitted for left-sided numbness.  MRI showed right MCA/ACA periventricular white matter small infarcts.  CT head and neck bilateral ICA bulb atherosclerosis with right ICA 40% stenosis.  LDL 134, A1c 7.9.  Discharged on DAPT  and Crestor  40.  Right ICA stenosis CTA neck in 09/2020 right ICA 40% stenosis Current CTA neck showed right ICA 75% stenosis progressed from before Vascular Surgery Dr. Sheree consulted Carotid US  right ICA 40 to 59% stenosis. Status post right TCAR 2/4 with Dr. Gretta  Hx of hypertension Hypotension and bradycardia likely due to stent placement Home meds:  losartan  50mg  On the low end On midodrine  5->10 every 8 hours On IVF Encourage p.o. intake Long-term BP goal normotensive  Hyperlipidemia Home meds:  Crestor  40mg , resumed in hospital LDL 126, goal < 70 Add Zetia , continue Crestor  40 Continue statin and Zetia  at discharge  Diabetes type II Uncontrolled Home meds:  metformin  1000mg , Lantus  HgbA1c 7.4, goal < 7.0 CBGs SSI Recommend close follow-up with PCP for better DM control  Headache, improved History of migraine on home Topamax  50 twice daily Continue monitoring Fioricet  PRN  Other Stroke Risk Factors Advanced age  Hospital day # 5   Patient seen and examined by NP/APP with MD. MD to update note as needed.   Jorene Last, DNP, FNP-BC [facility] Pager: [phone]  ATTENDING NOTE: I reviewed above note and agree with the assessment and plan. Pt was seen and examined.   Husband at bedside.  Patient lying in bed, lethargic, overnight hypotension the bradycardia, likely due to stent placement.  Received pseudoephedrine  overnight, changed to midodrine  for hypotension treatment.  Continue IV fluid, close monitoring.  Increase p.o. intake.  Continue DAPT and statin.  For detailed assessment and plan, please refer to above/below as I have made changes wherever appropriate.   Ary Cummins, MD PhD Stroke Neurology 03/17/2023 7:36 PM  I spent extensive face-to-face time with the patient, more than 50% of which was spent in counseling and coordination of care, reviewing test results, images and medication, and discussing the diagnosis, treatment plan and  potential prognosis. This patient's care requiresreview of multiple databases, neurological assessment, discussion with family, other specialists and medical decision making of high complexity. I had long discussion with husband the patient at bedside, updated pt current condition, treatment plan and potential prognosis, and answered all the questions.  They expressed understanding and appreciation.
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
## 2023-03-17 NOTE — Discharge Summary (Addendum)
 Stroke Discharge Summary  Patient ID: Jacqueline [name]   MRN: [id number]      DOB: 1950/11/25  Date of Admission: 03/12/2023 Date of Discharge: 03/19/2023  Attending Physician: Jerri Pfeiffer MD Consultant(s):   Treatment Team:  Gretta Lonni JINNY, MD   Patient's PCP:  Daryl Setter, NP  DISCHARGE PRIMARY DIAGNOSIS:  Stroke: Right parietal small infarct s/p TNK, etiology: Large vessel disease from right ICA stenosis    Secondary diagnosis History of stroke Right ICA stenosis status post TCAR Hypertension Hypotension and bradycardia due to ICA stenting Hyperlipidemia Diabetes Constipation  Allergies as of 03/19/2023       Reactions   Augmentin [amoxicillin-pot Clavulanate] Other (See Comments)   Altered mental status Did it involve swelling of the face/tongue/throat, SOB, or low BP? No Did it involve sudden or severe rash/hives, skin peeling, or any reaction on the inside of your mouth or nose? Unknown Did you need to seek medical attention at a hospital or doctor's office? Unknown When did it last happen? unk       If all above answers are NO, may proceed with cephalosporin use.   Definity  [perflutren  Lipid Microsphere] Other (See Comments)   Headache and facial flushing after Definity  IVP given. No SOB or Chest pain,and Vital signs stable. Montel Bohr, RN.   Novocain [procaine] Other (See Comments)   Altered mental status   Promethazine Swelling   Tongue swelling   Prozac  [fluoxetine  Hcl] Other (See Comments)   Lip swelling x 1 day after 1st dose        Medication List     STOP taking these medications    Azelastine  HCl 137 MCG/SPRAY Soln       TAKE these medications    Accu-Chek Guide test strip Generic drug: glucose blood USE AS INSTRUCTED TO TEST BLOOD SUGARS 3 TIMES DAILY E11.65   accu-chek multiclix lancets Use as instructed to test blood sugars 3 times daily E11.65   alendronate  70 MG tablet Commonly known as: FOSAMAX  Take 1 tablet (70  mg total) by mouth every 7 (seven) days. Take with a full glass of water on an empty stomach.   aspirin  EC 81 MG tablet Take 1 tablet (81 mg total) by mouth daily. Swallow whole. Start taking on: March 20, 2023   BD Pen Needle Nano 2nd Gen 32G X 4 MM Misc Generic drug: Insulin  Pen Needle Inject 1 Device into the skin daily in the afternoon. USE DAILY AS DIRECTED   bisacodyl  10 MG suppository Commonly known as: DULCOLAX Place 1 suppository (10 mg total) rectally daily as needed for moderate constipation.   clopidogrel  75 MG tablet Commonly known as: PLAVIX  Take 1 tablet (75 mg total) by mouth daily. Start taking on: March 20, 2023   ezetimibe  10 MG tablet Commonly known as: ZETIA  Take 1 tablet (10 mg total) by mouth at bedtime.   insulin  glargine 100 UNIT/ML injection Commonly known as: Lantus  INJECT 20 UNITS INTO THE SKIN DAILY   losartan  50 MG tablet Commonly known as: COZAAR  Take 1 tablet (50 mg total) by mouth daily.   metFORMIN  1000 MG tablet Commonly known as: GLUCOPHAGE  Take 1 tablet (1,000 mg total) by mouth 2 (two) times daily with a meal.   pantoprazole  20 MG tablet Commonly known as: Protonix  Take 1 tablet (20 mg total) by mouth daily.   rosuvastatin  40 MG tablet Commonly known as: CRESTOR  TAKE 1 TABLET BY MOUTH EVERYDAY AT BEDTIME What changed: See the new instructions.  tamsulosin  0.4 MG Caps capsule Commonly known as: FLOMAX  Take 0.4 mg by mouth daily.   topiramate  50 MG tablet Commonly known as: TOPAMAX  Take 1 tablet (50 mg total) by mouth 2 (two) times daily.   Vitamin D3 75 MCG (3000 UT) Tabs Take 1 tablet by mouth daily at 6 (six) AM.               Durable Medical Equipment  (From admission, onward)           Start     Ordered   03/19/23 1525  For home use only DME 4 wheeled rolling walker with seat  Once       Question:  Patient needs a walker to treat with the following condition  Answer:  CVA (cerebral vascular accident)  (HCC)   03/19/23 1525   03/19/23 1515  For home use only DME 4 wheeled rolling walker with seat  Once       Question:  Patient needs a walker to treat with the following condition  Answer:  Stroke (HCC)   03/19/23 1515            LABORATORY STUDIES CBC    Component Value Date/Time   WBC 5.7 03/18/2023 0431   RBC 3.43 (L) 03/18/2023 0431   HGB 10.1 (L) 03/18/2023 0431   HCT 30.8 (L) 03/18/2023 0431   PLT 166 03/18/2023 0431   MCV 89.8 03/18/2023 0431   MCH 29.4 03/18/2023 0431   MCHC 32.8 03/18/2023 0431   RDW 12.6 03/18/2023 0431   LYMPHSABS 1.1 03/12/2023 1500   MONOABS 0.4 03/12/2023 1500   EOSABS 0.2 03/12/2023 1500   BASOSABS 0.0 03/12/2023 1500   CMP    Component Value Date/Time   NA 139 03/18/2023 0431   K 3.6 03/18/2023 0431   CL 111 03/18/2023 0431   CO2 19 (L) 03/18/2023 0431   GLUCOSE 117 (H) 03/18/2023 0431   BUN 13 03/18/2023 0431   CREATININE 1.01 (H) 03/18/2023 0431   CREATININE 1.27 (H) 03/12/2022 1536   CALCIUM  8.5 (L) 03/18/2023 0431   PROT 6.8 03/12/2023 1500   ALBUMIN  3.9 03/12/2023 1500   AST 14 (L) 03/12/2023 1500   ALT 10 03/12/2023 1500   ALKPHOS 64 03/12/2023 1500   BILITOT 0.7 03/12/2023 1500   GFRNONAA 59 (L) 03/18/2023 0431   GFRAA >60 12/22/2018 2235   COAGS Lab Results  Component Value Date   INR 0.9 03/12/2023   INR 0.9 09/12/2020   INR 1.0 07/14/2018   Lipid Panel    Component Value Date/Time   CHOL 198 03/13/2023 0511   CHOL 83 (L) 10/16/2020 0847   TRIG 109 03/13/2023 0511   HDL 50 03/13/2023 0511   HDL 31 (L) 10/16/2020 0847   CHOLHDL 4.0 03/13/2023 0511   VLDL 22 03/13/2023 0511   LDLCALC 126 (H) 03/13/2023 0511   LDLCALC 31 10/16/2020 0847   HgbA1C  Lab Results  Component Value Date   HGBA1C 7.3 (H) 03/13/2023   Alcohol Level    Component Value Date/Time   ETH <10 03/12/2023 1500     SIGNIFICANT DIAGNOSTIC STUDIES VAS US  CAROTID Result Date: 03/16/2023 Carotid Arterial Duplex Study Patient Name:   Jacqueline [name]  Date of Exam:   03/15/2023 Medical Rec #: [id number]       Accession #:    [id number] Date of Birth: [birth year]       Patient Gender: F Patient Age:   73 years Exam Location:  [facility] Procedure:      VAS US  CAROTID Referring Phys: TERETHA DAMME --------------------------------------------------------------------------------  Indications:       Speech disturbance and Weakness. Risk Factors:      Hypertension, hyperlipidemia, Diabetes, prior CVA. Other Factors:     Right 75% ICA stenosis and Left <50% ICA stenosis by CTA of                    the neck. Limitations        Today's exam was limited due to the high bifurcation of the                    carotid. Comparison Study:  Prior carotid duplex indicating right 40-59% ICA stenosis and                    left 1-39% ICA stenosis done 04/23/21 Performing Technologist: Alberta Lis RVS  Examination Guidelines: A complete evaluation includes B-mode imaging, spectral Doppler, color Doppler, and power Doppler as needed of all accessible portions of each vessel. Bilateral testing is considered an integral part of a complete examination. Limited examinations for reoccurring indications may be performed as noted.  Right Carotid Findings: +----------+--------+--------+--------+------------------+---------+           PSV cm/sEDV cm/sStenosisPlaque DescriptionComments  +----------+--------+--------+--------+------------------+---------+ CCA Prox  123     17              homogeneous                 +----------+--------+--------+--------+------------------+---------+ CCA Distal92      17              homogeneous                 +----------+--------+--------+--------+------------------+---------+ ICA Prox  197     59      40-59%  calcific          Shadowing +----------+--------+--------+--------+------------------+---------+ ICA Mid   50      12                                tortuous   +----------+--------+--------+--------+------------------+---------+ ICA Distal75      19                                tortuous  +----------+--------+--------+--------+------------------+---------+ ECA       164     41                                          +----------+--------+--------+--------+------------------+---------+ +----------+--------+-------+--------+-------------------+           PSV cm/sEDV cmsDescribeArm Pressure (mmHG) +----------+--------+-------+--------+-------------------+ [id number]                                        +----------+--------+-------+--------+-------------------+ +---------+--------+--+--------+--+ VertebralPSV cm/s38EDV cm/s11 +---------+--------+--+--------+--+  Left Carotid Findings: +----------+--------+--------+--------+------------------+------------------+           PSV cm/sEDV cm/sStenosisPlaque DescriptionComments           +----------+--------+--------+--------+------------------+------------------+ CCA Prox  130     23  intimal thickening +----------+--------+--------+--------+------------------+------------------+ CCA Distal91      20              heterogenous                         +----------+--------+--------+--------+------------------+------------------+ ICA Prox  102     23      1-39%   heterogenous                         +----------+--------+--------+--------+------------------+------------------+ ICA Mid   74      18                                                   +----------+--------+--------+--------+------------------+------------------+ ICA Distal77      20                                                   +----------+--------+--------+--------+------------------+------------------+ ECA       100     13                                                   +----------+--------+--------+--------+------------------+------------------+  +----------+--------+--------+--------+-------------------+           PSV cm/sEDV cm/sDescribeArm Pressure (mmHG) +----------+--------+--------+--------+-------------------+ Subclavian100                                         +----------+--------+--------+--------+-------------------+ +---------+--------+--+--------+-+ VertebralPSV cm/s49EDV cm/s9 +---------+--------+--+--------+-+   Summary: Right Carotid: Velocities in the right ICA are consistent with a 40-59%                stenosis. Velocities may be underestimated secondary to acoustic                shadowing from plaque. Left Carotid: Velocities in the left ICA are consistent with a 1-39% stenosis.               Velocities may be underestimated secondary to acoustic shadowing               from plaque. Vertebrals:  Bilateral vertebral arteries demonstrate antegrade flow. Subclavians: Normal flow hemodynamics were seen in bilateral subclavian              arteries. *See table(s) above for measurements and observations.  Electronically signed by Lonni Gaskins MD on 03/16/2023 at 11:45:34 AM.    Final    HYBRID OR IMAGING ([facility] ONLY) Result Date: 03/16/2023 There is no interpretation for this exam.  This order is for images obtained during a surgical procedure.  Please See Surgeries Tab for more information regarding the procedure.   MR BRAIN WO CONTRAST Result Date: 03/13/2023 CLINICAL DATA:  Stroke suspected EXAM: MRI HEAD WITHOUT CONTRAST TECHNIQUE: Multiplanar, multiecho pulse sequences of the brain and surrounding structures were obtained without intravenous contrast. COMPARISON:  04/13/2021 MRI head, 03/13/2023 CT head FINDINGS: Evaluation is limited by motion Brain: Small focus of restricted diffusion with  ADC correlate in the right parietal cortex (series 2, image 34). Additional increased signal on diffusion-weighted imaging correlates with remote infarcts in the right periventricular white matter (series 2, image 33). No  acute hemorrhage, mass, mass effect, or midline shift. No hydrocephalus or extra-axial collection. T2 hyperintense signal in the periventricular white matter, likely the sequela of chronic small vessel ischemic disease. Vascular: Grossly normal arterial flow voids. Skull and upper cervical spine: Grossly normal marrow signal. Sinuses/Orbits: No acute finding. IMPRESSION: Small acute infarct in the right parietal cortex. These results will be called to the ordering clinician or representative by the Radiologist Assistant, and communication documented in the PACS or [employer]. Electronically Signed   By: Donald Campion M.D.   On: 03/13/2023 18:09   CT HEAD WO CONTRAST ( ) Result Date: 03/13/2023 CLINICAL DATA:  Stroke follow-up EXAM: CT HEAD WITHOUT CONTRAST TECHNIQUE: Contiguous axial images were obtained from the base of the skull through the vertex without intravenous contrast. RADIATION DOSE REDUCTION: This exam was performed according to the departmental dose-optimization program which includes automated exposure control, adjustment of the mA and/or kV according to patient size and/or use of iterative reconstruction technique. COMPARISON:  Head CT from yesterday FINDINGS: Brain: No evidence of acute infarction, hemorrhage, hydrocephalus, extra-axial collection or mass lesion/mass effect. Low-density areas in the periventricular white matter, likely chronic small vessel disease. Small chronic left parietal cortex infarct. Vascular: No hyperdense vessel or unexpected calcification. Skull: Normal. Negative for fracture or focal lesion. Sinuses/Orbits: No acute finding. IMPRESSION: No acute or interval finding. Electronically Signed   By: Dorn Roulette M.D.   On: 03/13/2023 12:22   ECHOCARDIOGRAM COMPLETE Result Date: 03/13/2023    ECHOCARDIOGRAM REPORT   Patient Name:   Jacqueline [name] Date of Exam: 03/13/2023 Medical Rec #:  [id number]      Height:       65.0 in Accession #:    [id number]     Weight:        171.0 lb Date of Birth:  04/12/1950      BSA:          1.851 m Patient Age:    72 years       BP:           160/75 mmHg Patient Gender: F              HR:           54 bpm. Exam Location:  Inpatient Procedure: 2D Echo, Cardiac Doppler and Color Doppler Indications:    Stroke I63.9  History:        Patient has prior history of Echocardiogram examinations, most                 recent 09/13/2020. Stroke and TIA; Risk Factors:Hypertension,                 Diabetes, Dyslipidemia and Non-Smoker.  Sonographer:    Tillman Nora RVT RCS Referring Phys: [id number] [facility]  Sonographer Comments: Image acquisition challenging due to uncooperative patient. IMPRESSIONS  1. Left ventricular ejection fraction, by estimation, is 55 to 60%. The left ventricle has normal function. The left ventricle has no regional wall motion abnormalities. Left ventricular diastolic parameters are consistent with Grade II diastolic dysfunction (pseudonormalization).  2. Right ventricular systolic function is normal. The right ventricular size is normal. Tricuspid regurgitation signal is inadequate for assessing PA pressure.  3. Left atrial size was mildly dilated.  4. The mitral valve is normal  in structure. Trivial mitral valve regurgitation. No evidence of mitral stenosis.  5. The aortic valve is tricuspid. Aortic valve regurgitation is mild. No aortic stenosis is present.  6. Aortic dilatation noted. There is borderline dilatation of the ascending aorta, measuring 37 mm.  7. The inferior vena cava is normal in size with <50% respiratory variability, suggesting right atrial pressure of 8 mmHg. FINDINGS  Left Ventricle: Left ventricular ejection fraction, by estimation, is 55 to 60%. The left ventricle has normal function. The left ventricle has no regional wall motion abnormalities. The left ventricular internal cavity size was normal in size. There is  no left ventricular hypertrophy. Left ventricular diastolic parameters are consistent  with Grade II diastolic dysfunction (pseudonormalization). Right Ventricle: The right ventricular size is normal. No increase in right ventricular wall thickness. Right ventricular systolic function is normal. Tricuspid regurgitation signal is inadequate for assessing PA pressure. Left Atrium: Left atrial size was mildly dilated. Right Atrium: Right atrial size was normal in size. Pericardium: There is no evidence of pericardial effusion. Mitral Valve: The mitral valve is normal in structure. Trivial mitral valve regurgitation. No evidence of mitral valve stenosis. Tricuspid Valve: The tricuspid valve is normal in structure. Tricuspid valve regurgitation is not demonstrated. Aortic Valve: The aortic valve is tricuspid. Aortic valve regurgitation is mild. Aortic regurgitation PHT measures 820 msec. No aortic stenosis is present. Aortic valve mean gradient measures 3.0 mmHg. Aortic valve peak gradient measures 5.7 mmHg. Aortic  valve area, by VTI measures 2.06 cm. Pulmonic Valve: The pulmonic valve was normal in structure. Pulmonic valve regurgitation is trivial. Aorta: Aortic dilatation noted. There is borderline dilatation of the ascending aorta, measuring 37 mm. Venous: The inferior vena cava is normal in size with less than 50% respiratory variability, suggesting right atrial pressure of 8 mmHg. IAS/Shunts: No atrial level shunt detected by color flow Doppler.  LEFT VENTRICLE PLAX 2D LVIDd:         4.50 cm   Diastology LVIDs:         2.30 cm   LV e' medial:    7.62 cm/s LV PW:         2.35 cm   LV E/e' medial:  9.2 LV IVS:        1.00 cm   LV e' lateral:   11.00 cm/s LVOT diam:     1.90 cm   LV E/e' lateral: 6.4 LV SV:         59 LV SV Index:   32 LVOT Area:     2.84 cm  RIGHT VENTRICLE             IVC RV Basal diam:  3.25 cm     IVC diam: 2.00 cm RV S prime:     15.00 cm/s TAPSE (M-mode): 1.8 cm LEFT ATRIUM             Index        RIGHT ATRIUM           Index LA diam:        3.50 cm 1.89 cm/m   RA Area:      12.20 cm LA Vol (A2C):   40.8 ml 22.05 ml/m  RA Volume:   28.40 ml  15.35 ml/m LA Vol (A4C):   46.7 ml 25.23 ml/m LA Biplane Vol: 43.5 ml 23.50 ml/m  AORTIC VALVE                    PULMONIC VALVE AV  Area (Vmax):    2.27 cm     PV Vmax:          0.85 m/s AV Area (Vmean):   2.14 cm     PV Peak grad:     2.9 mmHg AV Area (VTI):     2.06 cm     PR End Diast Vel: 1.38 msec AV Vmax:           119.50 cm/s AV Vmean:          80.850 cm/s AV VTI:            0.285 m AV Peak Grad:      5.7 mmHg AV Mean Grad:      3.0 mmHg LVOT Vmax:         95.57 cm/s LVOT Vmean:        61.133 cm/s LVOT VTI:          0.207 m LVOT/AV VTI ratio: 0.73 AI PHT:            820 msec AR Vena Contracta: 0.30 cm  AORTA Ao Root diam: 2.80 cm Ao Asc diam:  3.75 cm MITRAL VALVE MV Area (PHT): 3.85 cm    SHUNTS MV Decel Time: 197 msec    Systemic VTI:  0.21 m MV E velocity: 70.20 cm/s  Systemic Diam: 1.90 cm MV A velocity: 59.20 cm/s MV E/A ratio:  1.19 Dalton McleanMD Electronically signed by Ezra Kanner Signature Date/Time: 03/13/2023/11:46:55 AM    Final    CT HEAD WO CONTRAST ( ) Result Date: 03/12/2023 CLINICAL DATA:  Stroke, follow up EXAM: CT HEAD WITHOUT CONTRAST TECHNIQUE: Contiguous axial images were obtained from the base of the skull through the vertex without intravenous contrast. RADIATION DOSE REDUCTION: This exam was performed according to the departmental dose-optimization program which includes automated exposure control, adjustment of the mA and/or kV according to patient size and/or use of iterative reconstruction technique. COMPARISON:  Same day CT head. FINDINGS: Brain: No evidence of acute large vascular territory infarction, hemorrhage, hydrocephalus, extra-axial collection or mass lesion/mass effect. Similar white matter hypodensities, likely the sequela of chronic microvascular ischemic disease. Vascular: No hyperdense vessel. Skull: No acute fracture. Sinuses/Orbits: Mild paranasal sinus mucosal thickening. No  acute orbital findings. IMPRESSION: Stable head CT.  No evidence of acute intracranial abnormality. Electronically Signed   By: Gilmore GORMAN [name] M.D.   On: 03/12/2023 20:44   CT ANGIO HEAD NECK W WO CM Result Date: 03/12/2023 CLINICAL DATA:  Stroke suspected EXAM: CT ANGIOGRAPHY HEAD AND NECK WITH AND WITHOUT CONTRAST TECHNIQUE: Multidetector CT imaging of the head and neck was performed using the standard protocol during bolus administration of intravenous contrast. Multiplanar CT image reconstructions and MIPs were obtained to evaluate the vascular anatomy. Carotid stenosis measurements (when applicable) are obtained utilizing NASCET criteria, using the distal internal carotid diameter as the denominator. RADIATION DOSE REDUCTION: This exam was performed according to the departmental dose-optimization program which includes automated exposure control, adjustment of the mA and/or kV according to patient size and/or use of iterative reconstruction technique. CONTRAST:  75mL OMNIPAQUE  IOHEXOL  350 MG/ML SOLN COMPARISON:  03/12/2023 CT head, 09/12/2020 CTA head and neck FINDINGS: CT HEAD FINDINGS For noncontrast findings, please see same day CT head. CTA NECK FINDINGS Aortic arch: Standard branching. Imaged portion shows no evidence of aneurysm or dissection. No significant stenosis of the major arch vessel origins. Right carotid system: 75% stenosis in the proximal right ICA (series 603, image 188), which has progressed since 2022.  No evidence of dissection. Left carotid system: No evidence of dissection, occlusion, or hemodynamically significant stenosis (greater than 50%). Vertebral arteries: No evidence of dissection, occlusion, or hemodynamically significant stenosis (greater than 50%). Skeleton: No acute osseous abnormality. Degenerative changes in the cervical spine. Other neck: No acute finding. Upper chest: No focal pulmonary opacity or pleural effusion. Review of the MIP images confirms the above findings  CTA HEAD FINDINGS Anterior circulation: Both internal carotid arteries are patent to the termini, with mild stenosis in the left supraclinoid ICA. A1 segments patent, with moderate stenosis in the distal left A1 (series 603, image 102). Normal anterior communicating artery. Anterior cerebral arteries are patent to their distal aspects without significant stenosis. No M1 stenosis or occlusion. MCA branches perfused to their distal aspects without significant stenosis. Posterior circulation: Vertebral arteries patent to the vertebrobasilar junction without significant stenosis. Posterior inferior cerebellar arteries patent proximally. Basilar patent to its distal aspect without significant stenosis. Superior cerebellar arteries patent proximally. Patent right P1. Aplastic left P1. Fetal origin of the left PCA from the left posterior communicating artery. PCAs perfused to their distal aspects without significant stenosis. Venous sinuses: As permitted by contrast timing, patent. Anatomic variants: Fetal origin of the right PCA. No evidence of aneurysm or vascular malformation. Review of the MIP images confirms the above findings IMPRESSION: 1. No intracranial large vessel occlusion. Moderate stenosis in the distal left A1. 2. 75% stenosis in the proximal right ICA, which has progressed since 2022. 3. No other hemodynamically significant stenosis in the neck. Imaging results were communicated on 03/12/2023 at 3:55 pm to provider Dr. Michaela via secure text paging. Electronically Signed   By: Donald Campion M.D.   On: 03/12/2023 15:55   CT HEAD CODE STROKE WO CONTRAST Result Date: 03/12/2023 CLINICAL DATA:  Code stroke. Confusion, left facial droop, stroke suspected EXAM: CT HEAD WITHOUT CONTRAST TECHNIQUE: Contiguous axial images were obtained from the base of the skull through the vertex without intravenous contrast. RADIATION DOSE REDUCTION: This exam was performed according to the departmental dose-optimization  program which includes automated exposure control, adjustment of the mA and/or kV according to patient size and/or use of iterative reconstruction technique. COMPARISON:  04/13/2021 CT head FINDINGS: This study is somewhat limited by motion artifact. Brain: No evidence of acute infarction, hemorrhage, mass, mass effect, or midline shift. No hydrocephalus or extra-axial collection. Periventricular white matter changes, likely the sequela of chronic small vessel ischemic disease. Vascular: No hyperdense vessel. Skull: Negative for fracture or focal lesion. Sinuses/Orbits: No acute finding. Other: The mastoid air cells are well aerated. ASPECTS [facility] Stroke Program Early CT Score) - Ganglionic level infarction (caudate, lentiform nuclei, internal capsule, insula, M1-M3 cortex): 7 - Supraganglionic infarction (M4-M6 cortex): 3 Total score (0-10 with 10 being normal): 10 IMPRESSION: No acute intracranial process. ASPECTS is 10. These findings were discussed by telephone on 03/12/2023 at 3:30 pm with provider BRIAN MILLER . Electronically Signed   By: Donald Campion M.D.   On: 03/12/2023 15:31       HISTORY OF PRESENT ILLNESS 73 y.o. patient with history of diabetes, obesity, hypertension and hyperlipidemia was admitted with left facial droop, left-sided numbness, lethargy, slurred speech and headache  HOSPITAL COURSE Patient was given TNK to treat stroke, and no LVO was seen on CT angiogram.  She was found to have 75% right ICA stenosis, and TCAR was performed on 2/3 with Dr. Gretta.  Patient had some hypotension and bradycardia post TCAR and was treated with midodrine .  Stroke:  right parietal small infarct s/p TNK, etiology: Large vessel disease from right ICA stenosis Code Stroke CT head:  No acute abnormality.  ASPECTS 10. CTA head & neck: No LVO, right ICA 75% stenosis progressed from before Repeat Ct Head:   Stable MRI: small right CR/SO infarct 2D Echo: EF 55-60% Carotid US  R ICA 40-59%  stenosis LDL 126 HgbA1c 7.3 UDS neg VTE prophylaxis - lovenox  clopidogrel  75 mg daily prior to admission, continue aspirin  81 mg daily and clopidogrel  75 mg daily. Continue on discharge post TCAR Therapy recommendations: Outpatient PT, OT, SLP Disposition: Home today   Hx of Stroke/TIA 07/2018 presented with headache and left arm/face numbness.  CT no acute abnormality.  CTA head and neck unremarkable.  MRI showed no acute infarct, old deep white matter infarcts.  EF 60 to 65%.  LDL 119, A1c 11.2.  Discharged on DAPT and Lipitor 40. 09/2020 admitted for left-sided numbness.  MRI showed right MCA/ACA periventricular white matter small infarcts.  CT head and neck bilateral ICA bulb atherosclerosis with right ICA 40% stenosis.  LDL 134, A1c 7.9.  Discharged on DAPT and Crestor  40.   Right ICA stenosis CTA neck in 09/2020 right ICA 40% stenosis Current CTA neck showed right ICA 75% stenosis progressed from before Vascular Surgery Dr. Sheree consulted Carotid US  right ICA 40 to 59% stenosis. Status post right TCAR on 2/3 with Dr. Gretta On DAPT   Hx of hypertension Hypotension and bradycardia likely due to stent placement, improved Home meds:  losartan  50mg  Stable on the high end today On midodrine  5->10 every 8 hours-> discontinued 2/5 Encourage p.o. intake Long-term BP goal normotensive Bradycardia down to low 40s, now improved to 50s   Hyperlipidemia Home meds:  Crestor  40mg , resumed in hospital LDL 126, goal < 70 Add Zetia , continue Crestor  40 continue at discharge   Diabetes type II Uncontrolled Home meds:  metformin  1000mg , Lantus  HgbA1c 7.4, goal < 7.0 CBGs SSI Recommend close follow-up with PCP for better DM control   Headache, improved History of migraine on home Topamax  50 twice daily Continue monitoring Fioricet  PRN  Constipation Patient complains of severe constipation with no bowel movement for several days Attempted suppository without results Unable to tolerate  fleets enema due to discomfort Lactulose  given, instructed patient to use another suppository in the morning if no results and to follow-up with PCP if constipation not relieved then   Other Stroke Risk Factors Advanced age   DISCHARGE EXAM  PHYSICAL EXAM General:  Alert, well-nourished, well-developed patient in no acute distress Psych:  Mood and affect appropriate for situation CV: Regular rate and rhythm on monitor Respiratory:  Regular, unlabored respirations on room air   NEURO:  Mental Status: Awake, alert, fully oriented.  She follows all commands.  Able to give clear and coherent history. Speech/Language: No dysarthria.  No aphasia.     Cranial Nerves:  II: PERRL. Visual fields full.  III, IV, VI: EOMI. Eyelids elevate symmetrically.  V: Sensation is intact to light touch and symmetrical to face.  VII: Slight left facial droop  VIII: hearing intact to voice. IX, X: Palate elevates symmetrically. Phonation is normal.  KP:Dynloizm shrug 5/5. XII: tongue is midline without fasciculations. Motor:  BUE: 4+/5, no drift BLE: 4+/5, no drift Tone: is normal and bulk is normal Sensation- Intact to light touch bilaterally. Coordination: FTN intact bilaterally Gait- deferred   Most Recent NIH: 0  Discharge Diet       Diet   Diet  Carb Modified Fluid consistency: Thin; Room service appropriate? Yes   liquids  DISCHARGE PLAN Disposition: Home aspirin  81 mg daily and clopidogrel  75 mg daily for secondary stroke prevention  Ongoing stroke risk factor control by Primary Care Physician at time of discharge Follow-up PCP Daryl Setter, NP in 2 weeks. Follow-up in [facility] in 4 weeks, office to schedule an appointment.   45 minutes were spent preparing discharge.  Cortney E Everitt Clint Kill , MSN, AGACNP-BC [facility] See Amion for schedule and pager information 03/19/2023 5:29 PM  ATTENDING NOTE: I reviewed above note and  agree with the assessment and plan. Pt was seen and examined.   Husband at bedside.  Patient mildly lethargic but stated that she got up to bathroom by herself today, heart rate up to 60s, denies any dizziness or weakness.  Blood pressure and heart rate now improved.  However, patient has constipation with hemorrhoid flareup, declined suppository and Fleet enema due to pain in the anal area.  Started lactulose  but only had a 2 small bowel movement, patient would like to go home to continue further management.  Continue DAPT and statin, and Zetia .  PT and OT recommend outpatient.  Follow-up with vascular surgery and neurology as outpatient.   For detailed assessment and plan, please refer to above/below as I have made changes wherever appropriate.   Ary Cummins, MD PhD Stroke Neurology 03/19/2023 5:55 PM

## 2023-03-17 NOTE — Progress Notes (Signed)
 Occupational Therapy Treatment Patient Details Name: Jacqueline [name] MRN: [id number] DOB: Nov 28, 1950 Today's Date: 03/17/2023   History of present illness 73 yo female presented 1/30 with sudden onset dysarthria L facial droop facial numbness given TNK 1/30   CT head and CTA negative. MRI demonstrates Punctate foci of acute ischemia within the medial right frontal  lobe. Transcarotid artery revascularization (right: neck) 03/16/2023. PMH includes hypertension, hyperlipidemia, insulin -dependent type II diabetes, TIA versus complex migraine, obesity.   OT comments  Pt is making continued progress towards acute OT goals. Pt is limited due to decreased activity tolerance, positive orthostatics, R neck incision pain, impaired balance, and sensation in LUE. Pt completed functional mobility tasks with up to MIN A. Pt ambulated within room without use of DME and MIN A. Pt observed using furniture throughout the room for balance. Pt would benefit from trial with DME to improve balance and safety when performing functional mobility. Pt completed toileting task with CGA. Pt able to perform lateral leans and stand supported unilaterally to complete task. Pt able to locate items on L side (bathroom tissue, grab bar, soap dispenser when exiting restroom) without verbal cues to scan environment to locate. Will assess L side inattention further is future session. OT to continue following pt acutely to address functional needs with the d/c recommendations of OP OT to maximize functional independence and ensure safe discharge to home environment.   Sup 137/53 Sit 122/62 Stand 104/51 *lightheaded* After 5 minutes activity 100/55       If plan is discharge home, recommend the following:  A little help with walking and/or transfers;A little help with bathing/dressing/bathroom;Assistance with cooking/housework;Assist for transportation;Help with stairs or ramp for entrance   Equipment Recommendations  None recommended by  OT    Recommendations for Other Services      Precautions / Restrictions Precautions Precautions: Fall Precaution Comments: A-line. Watch BP Restrictions Weight Bearing Restrictions Per Provider Order: No       Mobility Bed Mobility Overal bed mobility: Needs Assistance Bed Mobility: Supine to Sit     Supine to sit: Min assist, HOB elevated     General bed mobility comments: MIN A to sit upright at EOB and verbal cues to scoot forward until B feet were flat on the floor.    Transfers Overall transfer level: Needs assistance Equipment used: None Transfers: Sit to/from Stand, Bed to chair/wheelchair/BSC Sit to Stand: Contact guard assist           General transfer comment: CGA for safety     Balance Overall balance assessment: Needs assistance Sitting-balance support: Bilateral upper extremity supported, Feet supported Sitting balance-Leahy Scale: Good     Standing balance support: Single extremity supported, During functional activity Standing balance-Leahy Scale: Fair Standing balance comment: completed peri care while standing supported unilaterally using grab bar for balance/support                           ADL either performed or assessed with clinical judgement   ADL Overall ADL's : Needs assistance/impaired     Grooming: Wash/dry hands;Contact guard assist;Standing                   Toilet Transfer: Contact guard assist;Ambulation;Regular Toilet;Grab bars   Toileting- Clothing Manipulation and Hygiene: Contact guard assist;Sitting/lateral lean;Sit to/from stand       Functional mobility during ADLs: Contact guard assist General ADL Comments: Pt with slight unsteadiness during functional ambulation without use of  DME. Observed holding onto items within room for balance. CGA provided for safety when performing toileting and functional mobility due to decrease in BP with movement    Extremity/Trunk Assessment Upper Extremity  Assessment Upper Extremity Assessment: LUE deficits/detail LUE Deficits / Details: numbness in LUE LUE Sensation: decreased light touch   Lower Extremity Assessment Lower Extremity Assessment: Defer to PT evaluation        Vision   Vision Assessment?: No apparent visual deficits   Perception Perception Perception: Impaired Preception Impairment Details: Inattention/Neglect Perception-Other Comments: Mild L side inattention but was able to identify several items on L side without verbal cues to scan environment on this date. Will continue to assess further.   Praxis Praxis Praxis: Not tested    Cognition Arousal: Alert Behavior During Therapy: WFL for tasks assessed/performed Overall Cognitive Status: Impaired/Different from baseline Area of Impairment: Safety/judgement, Awareness                   Current Attention Level: Focused   Following Commands: Follows one step commands consistently Safety/Judgement: Decreased awareness of safety, Decreased awareness of deficits Awareness: Emergent   General Comments: Pt required cues for safety. Pt did not vocalize symptoms of dizziness until asked.        Exercises      Shoulder Instructions       General Comments no increased redness or brusing at groin site after activity. On room air. Positive orthostatics    Pertinent Vitals/ Pain       Pain Assessment Pain Assessment: Faces Faces Pain Scale: Hurts little more Pain Location: incison; R neck Pain Descriptors / Indicators: Aching, Discomfort Pain Intervention(s): Monitored during session  Home Living                                          Prior Functioning/Environment              Frequency  Min 1X/week        Progress Toward Goals  OT Goals(current goals can now be found in the care plan section)  Progress towards OT goals: Progressing toward goals  Acute Rehab OT Goals Patient Stated Goal: to get out of bed OT Goal  Formulation: With patient Time For Goal Achievement: 03/28/23 Potential to Achieve Goals: Good ADL Goals Pt Will Perform Lower Body Dressing: with modified independence;sit to/from stand Pt Will Transfer to Toilet: with modified independence;ambulating Pt/caregiver will Perform Home Exercise Program: Left upper extremity;With written HEP provided Additional ADL Goal #1: Pt will follow 3 step commands in minimally distracting environment with no verbal cues.  Plan      Co-evaluation                 AM-PAC OT 6 Clicks Daily Activity     Outcome Measure   Help from another person eating meals?: A Little Help from another person taking care of personal grooming?: A Little Help from another person toileting, which includes using toliet, bedpan, or urinal?: A Little Help from another person bathing (including washing, rinsing, drying)?: A Little Help from another person to put on and taking off regular upper body clothing?: A Little Help from another person to put on and taking off regular lower body clothing?: A Little 6 Click Score: 18    End of Session Equipment Utilized During Treatment: Gait belt  OT Visit Diagnosis: Unsteadiness on feet (R26.81);Muscle  weakness (generalized) (M62.81);Other symptoms and signs involving cognitive function   Activity Tolerance Other (comment) (limited due to decrease in BP with movement)   Patient Left in chair;with call bell/phone within reach   Nurse Communication Mobility status        Time: 8640-8570 OT Time Calculation (min): 30 min  Charges: OT General Charges $OT Visit: 1 Visit OT Treatments $Self Care/Home Management : 23-37 mins  Thamas Lafe EARMON Thamas Lafe 03/17/2023, 3:30 PM

## 2023-03-17 NOTE — Progress Notes (Signed)
 PHARMACIST LIPID MONITORING   Jacqueline [name] is a 73 y.o. female admitted on 03/12/2023 with symptomatic right ICA stenosis s/p TCAR 2/3.  Pharmacy has been consulted to optimize lipid-lowering therapy with the indication of secondary prevention for clinical ASCVD.  Recent Labs:  Lipid Panel (last 6 months):   Lab Results  Component Value Date   CHOL 198 03/13/2023   TRIG 109 03/13/2023   HDL 50 03/13/2023   CHOLHDL 4.0 03/13/2023   VLDL 22 03/13/2023   LDLCALC 126 (H) 03/13/2023    Hepatic function panel (last 6 months):   Lab Results  Component Value Date   AST 14 (L) 03/12/2023   ALT 10 03/12/2023   ALKPHOS 64 03/12/2023   BILITOT 0.7 03/12/2023    SCr (since admission):   Serum creatinine: 0.97 mg/dL 97/95/74 9596 Estimated creatinine clearance: 52.7 mL/min  Current therapy and lipid therapy tolerance Current lipid-lowering therapy: rosuvastatin  40 mg (zetia  added this admission) Previous lipid-lowering therapies (if applicable): n/a Documented or reported allergies or intolerances to lipid-lowering therapies (if applicable): none  Assessment:   Confirmed adherence with PTA rosuvastatin  40 mg - LDL 126 mg/dL remains above goal.  Patient agrees with changes to lipid-lowering therapy.  Plan:    1.Statin intensity (high intensity recommended for all patients regardless of the LDL):  No statin changes. The patient is already on a high intensity statin.  2.Add ezetimibe  (if any one of the following):   On a high intensity statin with LDL > 70. Continue zetia  added this admission.  3.Refer to lipid clinic:   No  4.Follow-up with:  Primary care provider - Daryl Setter, NP  5.Follow-up labs after discharge:  Changes in lipid therapy were made. Check a lipid panel in 8-12 weeks then annually.     Maurilio RAYMOND Fila, PharmD 03/17/2023, 12:28 PM

## 2023-03-17 NOTE — Progress Notes (Signed)
 Physical Therapy Treatment Patient Details Name: Jacqueline [name] MRN: [id number] DOB: [birth year] Today's Date: 03/17/2023   History of Present Illness 73 yo female presented 1/30 with sudden onset dysarthria L facial droop facial numbness given TNK 1/30   CT head and CTA negative. MRI demonstrates Punctate foci of acute ischemia within the medial right frontal  lobe. Transcarotid artery revascularization (right: neck) 03/16/2023. PMH includes hypertension, hyperlipidemia, insulin -dependent type II diabetes, TIA versus complex migraine, obesity.    PT Comments  Patient progressing able to negotiate steps this session appropriate for home entry.  Noted antalgia with pt reporting L ankle arthritis and some imbalance so used RW for hallway ambulation and discussed with pt and spouse need for device at d/c.  She is eager to access community to feel appropriate for rollator walker.  Continue to recommend outpatient PT at d/c.     If plan is discharge home, recommend the following: Assistance with cooking/housework;Assist for transportation;Help with stairs or ramp for entrance   Can travel by private vehicle        Equipment Recommendations  Rollator (4 wheels)    Recommendations for Other Services       Precautions / Restrictions Precautions Precautions: Fall Precaution Comments: A-line. Watch BP     Mobility  Bed Mobility Overal bed mobility: Needs Assistance       Supine to sit: Supervision     General bed mobility comments: assist for lines/safety    Transfers Overall transfer level: Needs assistance Equipment used: None, Rolling walker (2 wheels) Transfers: Sit to/from Stand Sit to Stand: Contact guard assist           General transfer comment: no device during transitions at bedside, used in bathroom for toilet transfer    Ambulation/Gait Ambulation/Gait assistance: Contact guard assist, Supervision Gait Distance (Feet): 200 Feet Assistive device: None, Rolling  walker (2 wheels) Gait Pattern/deviations: Step-to pattern, Antalgic, Decreased step length - right, Shuffle       General Gait Details: initially no device, antalgic on L reporting ankle arthritis and with imbalance so used RW for hallway ambulation and able to walk with S.   Stairs Stairs: Yes Stairs assistance: Min assist Stair Management: Forwards, Step to pattern Number of Stairs: 2 General stair comments: with HHA on side away from wall and cues for step to pattern for safety; educated pt's spouse and reports he helps her up steps at home   Wheelchair Mobility     Tilt Bed    Modified Rankin (Stroke Patients Only) Modified Rankin (Stroke Patients Only) Pre-Morbid Rankin Score: No symptoms Modified Rankin: Moderately severe disability     Balance Overall balance assessment: Needs assistance Sitting-balance support: Feet supported Sitting balance-Leahy Scale: Good     Standing balance support: No upper extremity supported Standing balance-Leahy Scale: Fair Standing balance comment: washing hands at sink and moving in room no device                            Cognition Arousal: Alert Behavior During Therapy: WFL for tasks assessed/performed   Area of Impairment: Safety/judgement, Awareness                     Memory: Decreased short-term memory   Safety/Judgement: Decreased awareness of safety, Decreased awareness of deficits     General Comments: initially no assistive device though noted antalgia. pt relates using a friends walker due to L ankle arthritis, but when asked  stated she has never used a walker except to play around with when visiting her daughter in [place] when she had a brain tumor.        Exercises      General Comments General comments (skin integrity, edema, etc.): VSS with mobility; discussed device for home and spouse asking about rollator for community mobility      Pertinent Vitals/Pain Pain Assessment Faces  Pain Scale: Hurts little more Pain Location: incison; R neck Pain Descriptors / Indicators: Aching, Discomfort Pain Intervention(s): Monitored during session    Home Living                          Prior Function            PT Goals (current goals can now be found in the care plan section) Progress towards PT goals: Progressing toward goals    Frequency    Min 1X/week      PT Plan      Co-evaluation              AM-PAC PT 6 Clicks Mobility   Outcome Measure  Help needed turning from your back to your side while in a flat bed without using bedrails?: None Help needed moving from lying on your back to sitting on the side of a flat bed without using bedrails?: None Help needed moving to and from a bed to a chair (including a wheelchair)?: A Little Help needed standing up from a chair using your arms (e.g., wheelchair or bedside chair)?: A Little Help needed to walk in hospital room?: A Little Help needed climbing 3-5 steps with a railing? : A Little 6 Click Score: 20    End of Session Equipment Utilized During Treatment: Gait belt Activity Tolerance: Patient tolerated treatment well Patient left: in bed;with call bell/phone within reach;with family/visitor present   PT Visit Diagnosis: Unsteadiness on feet (R26.81);Other symptoms and signs involving the nervous system (R29.898)     Time: 8370-8343 PT Time Calculation (min) (ACUTE ONLY): 27 min  Charges:    $Gait Training: 8-22 mins $Therapeutic Activity: 8-22 mins PT General Charges $$ ACUTE PT VISIT: 1 Visit                     Micheline [name], PT Acute Rehabilitation Services Office:[phone] 03/17/2023    Jacqueline [name] 03/17/2023, 7:53 PM

## 2023-03-18 DIAGNOSIS — I639 Cerebral infarction, unspecified: Secondary | ICD-10-CM | POA: Diagnosis not present

## 2023-03-18 LAB — CBC
HCT: 30.8 % — ABNORMAL LOW (ref 36.0–46.0)
Hemoglobin: 10.1 g/dL — ABNORMAL LOW (ref 12.0–15.0)
MCH: 29.4 pg (ref 26.0–34.0)
MCHC: 32.8 g/dL (ref 30.0–36.0)
MCV: 89.8 fL (ref 80.0–100.0)
Platelets: 166 10*3/uL (ref 150–400)
RBC: 3.43 MIL/uL — ABNORMAL LOW (ref 3.87–5.11)
RDW: 12.6 % (ref 11.5–15.5)
WBC: 5.7 10*3/uL (ref 4.0–10.5)
nRBC: 0 % (ref 0.0–0.2)

## 2023-03-18 LAB — BASIC METABOLIC PANEL
Anion gap: 9 (ref 5–15)
BUN: 13 mg/dL (ref 8–23)
CO2: 19 mmol/L — ABNORMAL LOW (ref 22–32)
Calcium: 8.5 mg/dL — ABNORMAL LOW (ref 8.9–10.3)
Chloride: 111 mmol/L (ref 98–111)
Creatinine, Ser: 1.01 mg/dL — ABNORMAL HIGH (ref 0.44–1.00)
GFR, Estimated: 59 mL/min — ABNORMAL LOW (ref >60)
Glucose, Bld: 117 mg/dL — ABNORMAL HIGH (ref 70–99)
Potassium: 3.6 mmol/L (ref 3.5–5.1)
Sodium: 139 mmol/L (ref 135–145)

## 2023-03-18 LAB — GLUCOSE, CAPILLARY
Glucose-Capillary: 112 mg/dL — ABNORMAL HIGH (ref 70–99)
Glucose-Capillary: 174 mg/dL — ABNORMAL HIGH (ref 70–99)
Glucose-Capillary: 128 mg/dL — ABNORMAL HIGH (ref 70–99)
Glucose-Capillary: 125 mg/dL — ABNORMAL HIGH (ref 70–99)

## 2023-03-18 NOTE — Progress Notes (Signed)
  Progress Note    03/18/2023 9:14 AM 2 Days Post-Op  Subjective:  feels fine, only a little soreness around the incision with movement    Vitals:   03/18/23 0355 03/18/23 0816  BP: (!) 124/45 (!) 176/55  Pulse: (!) 42 (!) 47  Resp: 16 17  Temp: 98.8 F (37.1 C) 98.2 F (36.8 C)  SpO2: 95% 93%    Physical Exam: General:  resting comfortably, alert and oriented x4 Cardiac:  regular, SBPs 110s-120s Lungs:  nonlabored Incisions:  right sided subclavicular incision c/d/l without hematoma Extremities:  moving all extremities equally Neuro: intact. No slurred speech, no weakness/numbness, no facial droop  CBC    Component Value Date/Time   WBC 5.7 03/18/2023 0431   RBC 3.43 (L) 03/18/2023 0431   HGB 10.1 (L) 03/18/2023 0431   HCT 30.8 (L) 03/18/2023 0431   PLT 166 03/18/2023 0431   MCV 89.8 03/18/2023 0431   MCH 29.4 03/18/2023 0431   MCHC 32.8 03/18/2023 0431   RDW 12.6 03/18/2023 0431   LYMPHSABS 1.1 03/12/2023 1500   MONOABS 0.4 03/12/2023 1500   EOSABS 0.2 03/12/2023 1500   BASOSABS 0.0 03/12/2023 1500    BMET    Component Value Date/Time   NA 139 03/18/2023 0431   K 3.6 03/18/2023 0431   CL 111 03/18/2023 0431   CO2 19 (L) 03/18/2023 0431   GLUCOSE 117 (H) 03/18/2023 0431   BUN 13 03/18/2023 0431   CREATININE 1.01 (H) 03/18/2023 0431   CREATININE 1.27 (H) 03/12/2022 1536   CALCIUM  8.5 (L) 03/18/2023 0431   GFRNONAA 59 (L) 03/18/2023 0431   GFRAA >60 12/22/2018 2235    INR    Component Value Date/Time   INR 0.9 03/12/2023 1500     Intake/Output Summary (Last 24 hours) at 03/18/2023 0914 Last data filed at 03/18/2023 0422 Gross per 24 hour  Intake 1661.5 ml  Output --  Net 1661.5 ml      Assessment/Plan:  73 y.o. female is 2 days post op, s/p: right TCAR   -Patient is doing well this morning with no issues overnight -Right sided subclavicular incision is dry and intact without hematoma -Remains neurologically intact  -Blood pressure  improved overnight and this morning with SBPs in 110s-120s. She is on midodrine  10mg  q8h -Stable for d/c from vascular standpoint. Continue asa, plavix , and statin. She will follow up with our office in 4 wks with carotid duplex   Ahmed Holster, PA-C [facility] [phone] 03/18/2023 9:14 AM

## 2023-03-18 NOTE — Progress Notes (Signed)
 Mobility Specialist Progress Note:    03/18/23 1340  Mobility  Activity Ambulated with assistance in hallway  Level of Assistance Contact guard assist, steadying assist  Assistive Device Front wheel walker  Distance Ambulated (ft) 250 ft  Activity Response Tolerated well  Mobility Referral Yes  Mobility visit 1 Mobility  Mobility Specialist Start Time (ACUTE ONLY) [id number]  Mobility Specialist Stop Time (ACUTE ONLY) 1003  Mobility Specialist Time Calculation (min) (ACUTE ONLY) 10 min   Received pt in chair having no complaints and agreeable to mobility. Pt was asymptomatic throughout ambulation and returned to room w/o fault. Left in chair w/ call bell in reach and all needs met. RN in room   [employer] Mobility Specialist  Please contact vis Secure Chat or  Rehab Office [phone]

## 2023-03-18 NOTE — Progress Notes (Addendum)
 STROKE TEAM PROGRESS NOTE   INTERIM HISTORY/SUBJECTIVE Jacqueline [name] 2/3.  Continues to be bradycardiac with heart rate in the 30s to 50s, blood pressure has been improved and midodrine  has been discontinued.  OBJECTIVE  CBC    Component Value Date/Time   WBC 5.7 03/18/2023 0431   RBC 3.43 (L) 03/18/2023 0431   HGB 10.1 (L) 03/18/2023 0431   HCT 30.8 (L) 03/18/2023 0431   PLT 166 03/18/2023 0431   MCV 89.8 03/18/2023 0431   MCH 29.4 03/18/2023 0431   MCHC 32.8 03/18/2023 0431   RDW 12.6 03/18/2023 0431   LYMPHSABS 1.1 03/12/2023 1500   MONOABS 0.4 03/12/2023 1500   EOSABS 0.2 03/12/2023 1500   BASOSABS 0.0 03/12/2023 1500    BMET    Component Value Date/Time   NA 139 03/18/2023 0431   K 3.6 03/18/2023 0431   CL 111 03/18/2023 0431   CO2 19 (L) 03/18/2023 0431   GLUCOSE 117 (H) 03/18/2023 0431   BUN 13 03/18/2023 0431   CREATININE 1.01 (H) 03/18/2023 0431   CREATININE 1.27 (H) 03/12/2022 1536   CALCIUM  8.5 (L) 03/18/2023 0431   GFRNONAA 59 (L) 03/18/2023 0431    IMAGING past 24 hours No results found.    Vitals:   03/18/23 0355 03/18/23 0816 03/18/23 1127 03/18/23 1204  BP: (!) 124/45 (!) 176/55 (!) 159/65 (!) 172/72  Pulse: (!) 42 (!) 47 (!) 41   Resp: 16 17 20 17   Temp: 98.8 F (37.1 C) 98.2 F (36.8 C) 97.9 F (36.6 C)   TempSrc: Oral Oral Oral   SpO2: 95% 93%    Weight:      Height:         PHYSICAL EXAM General: Awake, alert, well-nourished, well-developed patient in no acute distress Psych:  Mood and affect appropriate for situation.  Calm and cooperative CV: Regular rate and rhythm on monitor, bradycardic Respiratory:  Regular, unlabored respirations on room air GI: Abdomen soft and nontender   NEURO:  Mental Status: Awake, alert, fully oriented.  She follows all commands.  Able to give clear and coherent history. Speech/Language: No dysarthria.  No aphasia.    Cranial Nerves:  II: PERRL. Visual fields full.  III, IV, VI: EOMI. Eyelids elevate  symmetrically.  V: Sensation is intact to light touch and symmetrical to face.  VII: Slight left facial droop  VIII: hearing intact to voice. IX, X: Palate elevates symmetrically. Phonation is normal.  KP:Dynloizm shrug 5/5. XII: tongue is midline without fasciculations. Motor:  BUE: 4/5, no drift BLE: 4/5, no drift Tone: is normal and bulk is normal Sensation- Intact to light touch bilaterally. Coordination: FTN intact bilaterally Gait- deferred  Most Recent NIH: 0    ASSESSMENT/PLAN Jacqueline [name] is a 73 y.o. female with PMH significant for DM2, obesity, HTN, HDL who presented 1/30 to OSH due to waxing and waning L facial droop, numbness, lethargy, slurred speech and headache. CTH negative, CTA no LVO. She was given TNK, not a candidate for EVT due to no LVO. NIH on Admission to [facility]: 6  Stroke:  right parietal small infarct s/p TNK, etiology: Large vessel disease from right ICA stenosis Code Stroke CT head:  No acute abnormality.  ASPECTS 10. CTA head & neck: No LVO, right ICA 75% stenosis progressed from before Repeat Ct Head:   Stable MRI: small right CR/SO infarct 2D Echo: EF 55-60% Carotid US  R ICA 40-59% stenosis LDL 126 HgbA1c 7.3 UDS neg VTE prophylaxis - lovenox  clopidogrel  75  mg daily prior to admission, continue aspirin  81 mg daily and clopidogrel  75 mg daily. Continue on discharge post Jacqueline [name] Therapy recommendations: Outpatient PT, OT, SLP Disposition:  pending  Hx of Stroke/TIA 07/2018 presented with headache and left arm/face numbness.  CT no acute abnormality.  CTA head and neck unremarkable.  MRI showed no acute infarct, old deep white matter infarcts.  EF 60 to 65%.  LDL 119, A1c 11.2.  Discharged on DAPT and Lipitor 40. 09/2020 admitted for left-sided numbness.  MRI showed right MCA/ACA periventricular white matter small infarcts.  CT head and neck bilateral ICA bulb atherosclerosis with right ICA 40% stenosis.  LDL 134, A1c 7.9.  Discharged on DAPT and  Crestor  40.  Right ICA stenosis CTA neck in 09/2020 right ICA 40% stenosis Current CTA neck showed right ICA 75% stenosis progressed from before Vascular Surgery Dr. Sheree consulted Carotid US  right ICA 40 to 59% stenosis. Status post right Jacqueline [name] 2/4 with Dr. Gretta  Hx of hypertension Hypotension and bradycardia likely due to stent placement Home meds:  losartan  50mg  Stable now On midodrine  5->10 every 8 hours-> discontinued 2/5 Encourage p.o. intake Long-term BP goal normotensive Bradycardia HR low 40s, increase with activity - will curbside cardiology  Hyperlipidemia Home meds:  Crestor  40mg , resumed in hospital LDL 126, goal < 70 Add Zetia , continue Crestor  40 Continue statin and Zetia  at discharge  Diabetes type II Uncontrolled Home meds:  metformin  1000mg , Lantus  HgbA1c 7.4, goal < 7.0 CBGs SSI Recommend close follow-up with PCP for better DM control  Headache, improved History of migraine on home Topamax  50 twice daily Continue monitoring Fioricet  PRN  Other Stroke Risk Factors Advanced age  Hospital day # 6   Patient seen and examined by NP/APP with MD. MD to update note as needed.  Cortney E Everitt Clint Kill , MSN, AGACNP-BC [facility] See Amion for schedule and pager information 03/18/2023 2:55 PM   ATTENDING NOTE: I reviewed above note and agree with the assessment and plan. Pt was seen and examined.   Husband at bedside.  Patient still somehow lethargic but no focal neurological deficit.  BP improved with midodrine , actually high, DC midodrine .  However patient still has bradycardia, 40s but increase with activity.  Will curbside cardiology.  Hold off discharge for today.  Continue DAPT and statin and Zetia .  Appreciate vascular surgery assistance.  For detailed assessment and plan, please refer to above/below as I have made changes wherever appropriate.   Ary Cummins, MD PhD Stroke Neurology 03/18/2023 6:17 PM

## 2023-03-19 ENCOUNTER — Other Ambulatory Visit: Payer: Self-pay | Admitting: Family

## 2023-03-19 DIAGNOSIS — I639 Cerebral infarction, unspecified: Secondary | ICD-10-CM | POA: Diagnosis not present

## 2023-03-19 LAB — GLUCOSE, CAPILLARY
Glucose-Capillary: 133 mg/dL — ABNORMAL HIGH (ref 70–99)
Glucose-Capillary: 174 mg/dL — ABNORMAL HIGH (ref 70–99)
Glucose-Capillary: 165 mg/dL — ABNORMAL HIGH (ref 70–99)

## 2023-03-19 MED ORDER — BISACODYL 10 MG RE SUPP: 10.0000 mg | Freq: Every day | RECTAL | 0 refills | Status: DC | PRN

## 2023-03-19 MED ORDER — LACTULOSE 10 GM/15ML PO SOLN
20.0000 g | Freq: Once | ORAL | Status: AC
  Administered 2023-03-19: 20 g via ORAL
  Filled 2023-03-19: qty 30

## 2023-03-19 MED ORDER — CLOPIDOGREL BISULFATE 75 MG PO TABS: 75.0000 mg | ORAL_TABLET | Freq: Every day | ORAL | 1 refills | Status: DC

## 2023-03-19 MED ORDER — FLEET ENEMA RE ENEM
1.0000 | ENEMA | Freq: Once | RECTAL | Status: DC
  Filled 2023-03-19: qty 1

## 2023-03-19 MED ORDER — ASPIRIN 81 MG PO TBEC: 81.0000 mg | DELAYED_RELEASE_TABLET | Freq: Every day | ORAL | 12 refills | Status: AC

## 2023-03-19 MED ORDER — EZETIMIBE 10 MG PO TABS: 10.0000 mg | ORAL_TABLET | Freq: Every day | ORAL | 1 refills | Status: DC

## 2023-03-19 NOTE — Progress Notes (Signed)
 Mobility Specialist Progress Note:    03/19/23 1154  Mobility  Activity Ambulated with assistance to bathroom;Dangled on edge of bed  Level of Assistance Standby assist, set-up cues, supervision of patient - no hands on  Assistive Device Other (Comment) (furniture walk)  Distance Ambulated (ft) 12 ft  Activity Response Tolerated well  Mobility Referral Yes  Mobility visit 1 Mobility  Mobility Specialist Start Time (ACUTE ONLY) 1145  Mobility Specialist Stop Time (ACUTE ONLY) 1154  Mobility Specialist Time Calculation (min) (ACUTE ONLY) 9 min   Pt received in bathroom, struggling to have BM. Heard outside door straining and crying, HR jumped to 135 bpm according to tele. Checked in on pt, encouraged pt not to strain to prevent LOC. Pt reported pulling out some stool. Pt ambulated to EOB, unable to sit d/t rectal discomfort, requested to ambulate back to bathroom d/t urinary urgency pt reports stool is adding pressure to bladder. Pt reports rectal bleeding. Left pt with all needs met, husband at bedside. Encouraged pt to pull bathroom light if needed and not to strain. RN notified.   Jacqueline [name] Mobility Specialist Please contact via Special Educational Needs Teacher or  Rehab office at [phone]

## 2023-03-19 NOTE — Progress Notes (Signed)
 STROKE TEAM PROGRESS NOTE   INTERIM HISTORY/SUBJECTIVE TCAR 2/3.  Bradycardia is improved today with heart rate in the 40s and 50s, and patient is normotensive.  However, she is experiencing severe constipation and request that this be resolved before she is discharged.  OBJECTIVE  CBC    Component Value Date/Time   WBC 5.7 03/18/2023 0431   RBC 3.43 (L) 03/18/2023 0431   HGB 10.1 (L) 03/18/2023 0431   HCT 30.8 (L) 03/18/2023 0431   PLT 166 03/18/2023 0431   MCV 89.8 03/18/2023 0431   MCH 29.4 03/18/2023 0431   MCHC 32.8 03/18/2023 0431   RDW 12.6 03/18/2023 0431   LYMPHSABS 1.1 03/12/2023 1500   MONOABS 0.4 03/12/2023 1500   EOSABS 0.2 03/12/2023 1500   BASOSABS 0.0 03/12/2023 1500    BMET    Component Value Date/Time   NA 139 03/18/2023 0431   K 3.6 03/18/2023 0431   CL 111 03/18/2023 0431   CO2 19 (L) 03/18/2023 0431   GLUCOSE 117 (H) 03/18/2023 0431   BUN 13 03/18/2023 0431   CREATININE 1.01 (H) 03/18/2023 0431   CREATININE 1.27 (H) 03/12/2022 1536   CALCIUM  8.5 (L) 03/18/2023 0431   GFRNONAA 59 (L) 03/18/2023 0431    IMAGING past 24 hours No results found.    Vitals:   03/18/23 2253 03/19/23 0310 03/19/23 0848 03/19/23 1433  BP: (!) 106/50 (!) 126/58 (!) 109/55 (!) 112/52  Pulse: (!) 50 (!) 49 (!) 49   Resp: 18 15 20 20   Temp: 97.9 F (36.6 C) 97.8 F (36.6 C) 97.7 F (36.5 C)   TempSrc: Oral Oral Oral   SpO2: 97% 97% 99% 95%  Weight:      Height:         PHYSICAL EXAM General: Awake, alert, well-nourished, well-developed patient in no acute distress Psych:  Mood and affect appropriate for situation.  Calm and cooperative CV: Regular rate and rhythm on monitor, bradycardic Respiratory:  Regular, unlabored respirations on room air   NEURO:  Mental Status: Awake, alert, fully oriented.  She follows all commands.  Able to give clear and coherent history. Speech/Language: No dysarthria.  No aphasia.    Cranial Nerves:  II: PERRL. Visual fields  full.  III, IV, VI: EOMI. Eyelids elevate symmetrically.  V: Sensation is intact to light touch and symmetrical to face.  VII: Slight left facial droop  VIII: hearing intact to voice. IX, X: Palate elevates symmetrically. Phonation is normal.  KP:Dynloizm shrug 5/5. XII: tongue is midline without fasciculations. Motor:  BUE: 4/5, no drift BLE: 4/5, no drift Tone: is normal and bulk is normal Sensation- Intact to light touch bilaterally. Coordination: FTN intact bilaterally Gait- deferred  Most Recent NIH: 0    ASSESSMENT/PLAN [name] is a 73 y.o. female with PMH significant for DM2, obesity, HTN, HDL who presented 1/30 to OSH due to waxing and waning L facial droop, numbness, lethargy, slurred speech and headache. CTH negative, CTA no LVO. She was given TNK, not a candidate for EVT due to no LVO. NIH on Admission to [facility]: 6  Stroke:  right parietal small infarct s/p TNK, etiology: Large vessel disease from right ICA stenosis Code Stroke CT head:  No acute abnormality.  ASPECTS 10. CTA head & neck: No LVO, right ICA 75% stenosis progressed from before Repeat Ct Head:   Stable MRI: small right CR/SO infarct 2D Echo: EF 55-60% Carotid US  R ICA 40-59% stenosis LDL 126 HgbA1c 7.3 UDS neg  VTE prophylaxis - lovenox  clopidogrel  75 mg daily prior to admission, continue aspirin  81 mg daily and clopidogrel  75 mg daily. Continue on discharge post TCAR Therapy recommendations: Outpatient PT, OT, SLP Disposition:  pending  Hx of Stroke/TIA 07/2018 presented with headache and left arm/face numbness.  CT no acute abnormality.  CTA head and neck unremarkable.  MRI showed no acute infarct, old deep white matter infarcts.  EF 60 to 65%.  LDL 119, A1c 11.2.  Discharged on DAPT and Lipitor 40. 09/2020 admitted for left-sided numbness.  MRI showed right MCA/ACA periventricular white matter small infarcts.  CT head and neck bilateral ICA bulb atherosclerosis with right ICA 40% stenosis.  LDL  134, A1c 7.9.  Discharged on DAPT and Crestor  40.  Right ICA stenosis CTA neck in 09/2020 right ICA 40% stenosis Current CTA neck showed right ICA 75% stenosis progressed from before Vascular Surgery Dr. Sheree consulted Carotid US  right ICA 40 to 59% stenosis. Status post right TCAR 2/4 with Dr. Gretta  Hx of hypertension Hypotension and bradycardia likely due to stent placement Home meds:  losartan  50mg  Stable now On midodrine  5->10 every 8 hours-> discontinued 2/5 Encourage p.o. intake Long-term BP goal normotensive Bradycardia HR low 40s, increase with activity - will curbside cardiology  Hyperlipidemia Home meds:  Crestor  40mg , resumed in hospital LDL 126, goal < 70 Add Zetia , continue Crestor  40 Continue statin and Zetia  at discharge  Diabetes type II Uncontrolled Home meds:  metformin  1000mg , Lantus  HgbA1c 7.4, goal < 7.0 CBGs SSI Recommend close follow-up with PCP for better DM control  Headache, improved History of migraine on home Topamax  50 twice daily Continue monitoring Fioricet  PRN  Constipation Patient complains of severe constipation with no bowel movement for several days Attempted suppository without results Unable to tolerate fleets enema due to discomfort Will try lactulose   Other Stroke Risk Factors Advanced age  Hospital day # 7   Patient seen and examined by NP/APP with MD. MD to update note as needed.  Jacqueline [name] , MSN, AGACNP-BC [facility] See Amion for schedule and pager information 03/19/2023 2:58 PM

## 2023-03-19 NOTE — Progress Notes (Signed)
 Physical Therapy Treatment Patient Details Name: Jacqueline [name] MRN: [id number] DOB: [birth year] Today's Date: 03/19/2023   History of Present Illness 73 yo female presented 1/30 with sudden onset dysarthria L facial droop facial numbness given TNK 1/30   CT head and CTA negative. MRI demonstrates Punctate foci of acute ischemia within the medial right frontal  lobe. Transcarotid artery revascularization (right: neck) 03/16/2023. PMH includes hypertension, hyperlipidemia, insulin -dependent type II diabetes, TIA versus complex migraine, obesity.    PT Comments  Pt is continuously progressing towards goals. Currently pt  is Mod I for sit to stand and gait with RW.  Pt spouse is supportive. Due to pt current functional status, home set up and available assistance at home recommending skilled physical therapy services 3x/week in order to address strength, balance and functional mobility to decrease risk for falls, injury and re-hospitalization.      If plan is discharge home, recommend the following: Assistance with cooking/housework;Assist for transportation;Help with stairs or ramp for entrance     Equipment Recommendations  None recommended by PT       Precautions / Restrictions Precautions Precautions: Fall Restrictions Weight Bearing Restrictions Per Provider Order: No     Mobility  Bed Mobility Overal bed mobility: Modified Independent Bed Mobility: Supine to Sit, Sit to Supine     Supine to sit: Modified independent (Device/Increase time) Sit to supine: Modified independent (Device/Increase time)        Transfers Overall transfer level: Modified independent Equipment used: Rolling walker (2 wheels) Transfers: Sit to/from Stand Sit to Stand: Modified independent (Device/Increase time)           General transfer comment: Pt able to stand with and without an AD with godo safety awareness.    Ambulation/Gait Ambulation/Gait assistance: Modified independent (Device/Increase  time) Gait Distance (Feet): 400 Feet Assistive device: Rolling walker (2 wheels) Gait Pattern/deviations: Step-through pattern, Decreased stride length Gait velocity: decreased Gait velocity interpretation: 1.31 - 2.62 ft/sec, indicative of limited community ambulator       Stairs Stairs:  (discussed up with the good, down with the bad and sequencing with RW for stair navigation per home set up)           Modified Rankin (Stroke Patients Only) Modified Rankin (Stroke Patients Only) Pre-Morbid Rankin Score: No symptoms Modified Rankin: No significant disability     Balance Overall balance assessment: Mild deficits observed, not formally tested Sitting-balance support: Feet supported Sitting balance-Leahy Scale: Normal     Standing balance support: No upper extremity supported, Bilateral upper extremity supported Standing balance-Leahy Scale: Fair Standing balance comment: no overt LOB          Cognition Arousal: Alert Behavior During Therapy: WFL for tasks assessed/performed Overall Cognitive Status: Within Functional Limits for tasks assessed                   General Comments General comments (skin integrity, edema, etc.): Pt advised to use RW at home when she first returns. Discussed possible use of rollator later on if needed      Pertinent Vitals/Pain Pain Assessment Faces Pain Scale: Hurts a little bit Pain Location: incison; R neck Pain Descriptors / Indicators: Aching, Discomfort Pain Intervention(s): Monitored during session           PT Goals (current goals can now be found in the care plan section) Acute Rehab PT Goals Patient Stated Goal: get back independent, go home today. PT Goal Formulation: With patient Time For Goal Achievement: 03/21/23 Potential  to Achieve Goals: Good Progress towards PT goals: Progressing toward goals    Frequency    Min 1X/week      PT Plan  Continue with current POC        AM-PAC PT 6 Clicks  Mobility   Outcome Measure  Help needed turning from your back to your side while in a flat bed without using bedrails?: None Help needed moving from lying on your back to sitting on the side of a flat bed without using bedrails?: None Help needed moving to and from a bed to a chair (including a wheelchair)?: None Help needed standing up from a chair using your arms (e.g., wheelchair or bedside chair)?: None Help needed to walk in hospital room?: None Help needed climbing 3-5 steps with a railing? : A Little 6 Click Score: 23    End of Session Equipment Utilized During Treatment: Gait belt Activity Tolerance: Patient tolerated treatment well Patient left: in bed;with call bell/phone within reach;with family/visitor present Nurse Communication: Mobility status PT Visit Diagnosis: Unsteadiness on feet (R26.81);Other symptoms and signs involving the nervous system (R29.898)     Time: 8567-8553 PT Time Calculation (min) (ACUTE ONLY): 14 min  Charges:    $Therapeutic Activity: 8-22 mins PT General Charges $$ ACUTE PT VISIT: 1 Visit                    Dorothyann Maier, DPT, CLT  Acute Rehabilitation Services Office: [phone] (Secure chat preferred)    Dorothyann VEAR Maier 03/19/2023, 3:38 PM

## 2023-03-19 NOTE — TOC Transition Note (Signed)
 Transition of Care [facility]) - Discharge Note Rayfield Gobble RN, BSN Transitions of Care Unit 4E- RN Case Manager See Treatment Team for direct phone #   Patient Details  Name: Jacqueline [name] MRN: [id number] Date of Birth: 1950/04/27  Transition of Care [facility]) CM/SW Contact:  Gobble Rayfield Hurst, RN Phone Number: 03/19/2023, 3:54 PM   Clinical Narrative:    Noted recommendations for outpt PT/OT, and DME- rollator. Orders to be placed.   Cm spoke with pt and spouse at bedside, choice offered for outpt location- neuro rehab referral- as well as DME provider. Pt/spouse agreeable to referral to neuro rehab, and state they do not have a preference for DME provider.   Referral sent to Neuro rehab- for outpt PT/OT needs- ref# [id number], info placed on AVS for pt.   Call made to [employer] liaison for DME- rollator- DME to be delivered to room prior to discharge.   Spouse to transport home, No further TOC needs noted.    Final next level of care: OP Rehab Barriers to Discharge: No Barriers Identified   Patient Goals and CMS Choice Patient states their goals for this hospitalization and ongoing recovery are:: return home CMS Medicare.gov Compare Post Acute Care list provided to:: Patient Choice offered to / list presented to : Patient, Spouse      Discharge Placement                 Home      Discharge Plan and Services Additional resources added to the After Visit Summary for     Discharge Planning Services: CM Consult Post Acute Care Choice: Durable Medical Equipment          DME Arranged: Walker rolling with seat DME Agency: [employer] Date DME Agency Contacted: 03/19/23 Time DME Agency Contacted: 1530 Representative spoke with at DME Agency: Darlyn HH Arranged: NA HH Agency: NA        Social Drivers of Health (SDOH) Interventions SDOH Screenings   Food Insecurity: No Food Insecurity (03/13/2023)  Housing: Low Risk  (03/13/2023)  Transportation Needs: Patient Unable To  Answer (03/13/2023)  Utilities: Not At Risk (03/13/2023)  Depression (PHQ2-9): Low Risk  (07/01/2022)  Financial Resource Strain: Low Risk  (01/30/2022)   Received from [facility], [facility]  Physical Activity: Insufficiently Active (12/27/2018)  Social Connections: Patient Unable To Answer (03/13/2023)  Tobacco Use: Low Risk  (03/16/2023)     Readmission Risk Interventions    03/19/2023    3:53 PM  Readmission Risk Prevention Plan  Transportation Screening Complete  Home Care Screening Complete  Medication Review (RN CM) Complete

## 2023-03-20 ENCOUNTER — Telehealth: Payer: Self-pay

## 2023-03-20 ENCOUNTER — Other Ambulatory Visit (HOSPITAL_COMMUNITY): Payer: Self-pay

## 2023-03-20 ENCOUNTER — Telehealth: Payer: Self-pay | Admitting: Adult Health

## 2023-03-20 NOTE — Transitions of Care (Post Inpatient/ED Visit) (Signed)
 03/20/2023  Name: Jacqueline [name] MRN: [id number] DOB: [birth year]  Today's TOC FU Call Status: Today's TOC FU Call Status:: Successful TOC FU Call Completed TOC FU Call Complete Date: 03/20/23 Patient's Name and Date of Birth confirmed.  Transition Care Management Follow-up Telephone Call Date of Discharge: 03/19/23 Discharge Facility: [facility]) Type of Discharge: Inpatient Admission Primary Inpatient Discharge Diagnosis:: Stroke, Large vessel disease R ICA stenosis How have you been since you were released from the hospital?: Better (I'm doing better, weak and tired) Any questions or concerns?: No  Items Reviewed: Did you receive and understand the discharge instructions provided?: Yes Medications obtained,verified, and reconciled?: Yes (Medications Reviewed) Any new allergies since your discharge?: No Dietary orders reviewed?: Yes Type of Diet Ordered:: Carb Modified, thin liq Do you have support at home?: Yes People in Home: spouse  Medications Reviewed Today: Medications Reviewed Today     Reviewed by Chiara Coltrin, RN (Case Manager) on 03/20/23 at 1607  Med List Status: <None>   Medication Order Taking? Sig Documenting Provider Last Dose Status Informant  alendronate  (FOSAMAX ) 70 MG tablet [id number] Yes Take 1 tablet (70 mg total) by mouth every 7 (seven) days. Take with a full glass of water on an empty stomach. Daryl Setter, NP Taking Active Spouse/Significant Other, Pharmacy Records           Med Note [facility], ALFREIDA LITTIE Schaumann Mar 12, 2023  7:30 PM) Unable to confirm exact date  aspirin  EC 81 MG tablet [id number] Yes Take 1 tablet (81 mg total) by mouth daily. Swallow whole. de Clint Kill, Cortney E, NP Taking Active   bisacodyl  (DULCOLAX) 10 MG suppository [id number] Yes Place 1 suppository (10 mg total) rectally daily as needed for moderate constipation. de Clint Kill, Cortney E, NP Taking Active   Cholecalciferol (VITAMIN D3) 75 MCG (3000 UT) TABS [id number]  Yes Take 1 tablet by mouth daily at 6 (six) AM. Daryl Setter, NP Taking Active Spouse/Significant Other, Pharmacy Records  clopidogrel  (PLAVIX ) 75 MG tablet [id number] Yes Take 1 tablet (75 mg total) by mouth daily. de Clint Kill, Cortney E, NP Taking Active   ezetimibe  (ZETIA ) 10 MG tablet [id number] Yes Take 1 tablet (10 mg total) by mouth at bedtime. de Clint Kill, Cortney E, NP Taking Active   glucose blood (ACCU-CHEK GUIDE) test strip [id number] Yes USE AS INSTRUCTED TO TEST BLOOD SUGARS 3 TIMES DAILY E11.65  Patient taking differently: 1 each by Other route as needed for other. USE AS INSTRUCTED TO TEST BLOOD SUGARS 3 TIMES DAILY E11.65   Shamleffer, Ibtehal Jaralla, MD Taking Active Spouse/Significant Other, Pharmacy Records  insulin  glargine (LANTUS ) 100 UNIT/ML injection [id number] Yes INJECT 20 UNITS INTO THE SKIN DAILY Shamleffer, Ibtehal Jaralla, MD Taking Active Spouse/Significant Other, Pharmacy Records  Insulin  Pen Needle (BD PEN NEEDLE NANO 2ND GEN) 32G X 4 MM MISC [id number] Yes Inject 1 Device into the skin daily in the afternoon. USE DAILY AS DIRECTED Shamleffer, Donell Cardinal, MD Taking Active Spouse/Significant Other, Pharmacy Records  Lancets (ACCU-CHEK MULTICLIX) lancets [id number] Yes Use as instructed to test blood sugars 3 times daily E11.65 Shamleffer, Ibtehal Jaralla, MD Taking Active Spouse/Significant Other, Pharmacy Records  losartan  (COZAAR ) 50 MG tablet [id number] Yes Take 1 tablet (50 mg total) by mouth daily. Daryl Setter, NP Taking Active Spouse/Significant Other, Pharmacy Records           Med Note EFRAIM, ALFREIDA LITTIE Schaumann Mar 12, 2023  7:32 PM) Patient has not had  this medication in about a month per patients husband due to new insurance not covering.  metFORMIN  (GLUCOPHAGE ) 1000 MG tablet [id number] Yes Take 1 tablet (1,000 mg total) by mouth 2 (two) times daily with a meal. Daryl Setter, NP Taking Active Spouse/Significant Other, Pharmacy Records   pantoprazole  (PROTONIX ) 20 MG tablet [id number] No Take 1 tablet (20 mg total) by mouth daily. Daryl Setter, NP Unknown Active Spouse/Significant Other, Pharmacy Records  rosuvastatin  (CRESTOR ) 40 MG tablet [id number] Yes TAKE 1 TABLET BY MOUTH EVERYDAY AT BEDTIME  Patient taking differently: Take 40 mg by mouth at bedtime.   Daryl Setter, NP Taking Active Spouse/Significant Other, Pharmacy Records  tamsulosin  (FLOMAX ) 0.4 MG CAPS capsule [id number] Yes Take 0.4 mg by mouth daily. [provider] Taking Active Spouse/Significant Other, Pharmacy Records  topiramate  (TOPAMAX ) 50 MG tablet [id number] Yes Take 1 tablet (50 mg total) by mouth 2 (two) times daily. Whitfield Raisin, NP Taking Active Spouse/Significant Other, Pharmacy Records            Home Care and Equipment/Supplies: Were Home Health Services Ordered?: NA ([facility]) Any new equipment or medical supplies ordered?: Yes Name of Medical supply agency?: [employer] Were you able to get the equipment/medical supplies?: Yes Do you have any questions related to the use of the equipment/supplies?: No  Functional Questionnaire: Do you need assistance with bathing/showering or dressing?: Yes Do you need assistance with meal preparation?: Yes Do you need assistance with eating?: No Do you have difficulty maintaining continence: No Do you need assistance with getting out of bed/getting out of a chair/moving?: Yes (sb-supv balance; spouse avail to assist as needed) Do you have difficulty managing or taking your medications?: No  Follow up appointments reviewed: PCP Follow-up appointment confirmed?: No (Patient and spouse have been advised to set up appt with PCP within a week and to call RNCM back if they needed) MD Provider Line [phone] Given: No Specialist Hospital Follow-up appointment confirmed?: Yes Date of Specialist follow-up appointment?: 04/09/23 Follow-Up Specialty  Provider:: Neurologist, Dr. Godfrey Do you need transportation to your follow-up appointment?: No (My husband will take me) Do you understand care options if your condition(s) worsen?: Yes-patient verbalized understanding  SDOH Interventions Today    Flowsheet Row Most Recent Value  SDOH Interventions   Food Insecurity Interventions Intervention Not Indicated  Housing Interventions Intervention Not Indicated  Transportation Interventions Intervention Not Indicated  Utilities Interventions Intervention Not Indicated          Goals Addressed             This Visit's Progress    Transition of Care       PCurrent Barriers:  Knowledge Deficits related to plan of care for management of HLD , HTN and correlation with Strokes  RNCM Clinical Goal(s):  Patient will work with the Care Management team over the next 30 days to address Transition of Care Barriers:  Medication Management, Diet/Nutrition/Food Resources, Provider appointments, Equipment/DME, Functional/Safety. Verbalize understanding of plan for management of HTN and HLD as evidenced by patient able to verbalize normal blood pressure. Take all medications exactly as prescribed and will call provider for medication related questions as evidenced by review of EMR and report from patient/caregiver through collaboration with RN Care manager, provider, and care team.   Interventions: Evaluation of current treatment plan related to  self management and patient's adherence to plan as established by provider. Provided verbal education to the patient and her spouse regarding the golden  window referring to  the first 60 minutes  after the onset of stroke symptoms, also known as the golden hour, during which time immediate medical intervention offers the best chance to minimize brain damage and maximize recovery potential.    Both verbalized understanding.                                                                                                                                                                                                                                                             Transitions of Care:  New goal. Durable Medical Equipment (DME) education provided and re-emphasized safe use of walker in the home and community  Doctor Visits  - discussed the importance of  attending all doctor visits.  Follow-up care is a key part of your treatment, safety and recovery! Post discharge activity limitations prescribed by provider reviewed Reminded Patient to schedule appointment with PCP, Eleanor Ponto within the next week.  Request to notify RNCM if assistance is needed to get this appointment scheduled for you.  Stroke:  (Status:New goal.) Short Term Goal Reviewed Importance of attending all scheduled provider appointments Assessed social determinant of health barriers - no needs identified at  this time. Reviewed/discussed and Assessed for signs and symptoms of stroke and calling 911 for immediate medical attention and treatment.   Reviewed/discussed referrals to made for outpatient therapy services to be provided at [facility] for outpatient physical, occuputational and speech therapy services.  Patient Goals/Self-Care Activities: Participate in Transition of Care Program/Attend [facility] scheduled calls Notify RN Care Manager of TOC call rescheduling needs Take all medications as prescribed Attend all scheduled provider appointments Call provider office for new  concerns or questions  check blood pressure daily, write the readings in a log and take it with you to your next appointment,                                                                                                                                                                                                                                                                                                                                                                                                      Follow Up Plan:  Telephone follow up appointment with care management team member  scheduled for:  04/01/23 at 10 AM, as the patient will be out of town several days the following week and she and her have multiple appointments on the other days,  RNCM and patient were agreeable to setting a date for the following week. The patient has been provided with contact information for the care management team and has been advised to call with any health related questions or concerns.             Pasco Lunger BSN, Programmer, Systems   Transitions of Care  [place] [facility], [facility] Direct Dial Number: [phone]  Fax: [phone]

## 2023-03-20 NOTE — Telephone Encounter (Signed)
 PA request has been Submitted. New Encounter created for follow up. For additional info see Pharmacy Prior Auth telephone encounter from 03/20/2023.

## 2023-03-20 NOTE — Telephone Encounter (Signed)
 Pharmacy Patient Advocate Encounter  Received notification from [employer] that Prior Authorization for Topiramate  50MG  tablets has been APPROVED from 02/18/2023 to 03/19/2024. Ran test claim, Copay is $12.01. This test claim was processed through [facility]- copay amounts may vary at other pharmacies due to pharmacy/plan contracts, or as the patient moves through the different stages of their insurance plan.   PA #/Case ID/Reference #: PA Case ID #: [id number]  Key: Jacqueline [name]

## 2023-03-20 NOTE — Telephone Encounter (Signed)
 Pt called her insurance and was informed that she is needing to have provider send in information in order for them to cover her topiramate  (TOPAMAX ) 50 MG tablet. Please advise.

## 2023-03-23 NOTE — Telephone Encounter (Signed)
 Pt informed via mychart

## 2023-03-30 ENCOUNTER — Other Ambulatory Visit: Payer: Self-pay

## 2023-03-30 ENCOUNTER — Encounter: Payer: Self-pay | Admitting: Physical Therapy

## 2023-03-30 ENCOUNTER — Ambulatory Visit: Payer: Medicare (Managed Care) | Attending: Nurse Practitioner | Admitting: Physical Therapy

## 2023-03-30 ENCOUNTER — Encounter: Payer: Self-pay | Admitting: Occupational Therapy

## 2023-03-30 ENCOUNTER — Ambulatory Visit: Payer: Medicare (Managed Care) | Admitting: Occupational Therapy

## 2023-03-30 VITALS — BP 146/69 | HR 63

## 2023-03-30 DIAGNOSIS — R2681 Unsteadiness on feet: Secondary | ICD-10-CM

## 2023-03-30 DIAGNOSIS — R42 Dizziness and giddiness: Secondary | ICD-10-CM | POA: Diagnosis not present

## 2023-03-30 DIAGNOSIS — R2689 Other abnormalities of gait and mobility: Secondary | ICD-10-CM | POA: Diagnosis not present

## 2023-03-30 DIAGNOSIS — I639 Cerebral infarction, unspecified: Secondary | ICD-10-CM | POA: Diagnosis not present
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
## 2023-04-01 ENCOUNTER — Other Ambulatory Visit: Payer: Self-pay

## 2023-04-01 NOTE — Patient Outreach (Signed)
Care Management  Transitions of Care Program Transitions of Care Post-discharge week 3   04/01/2023 Name: Jacqueline [name] MRN: [id number] DOB: 1950-04-19  Subjective: Jacqueline [name] is a 73 y.o. year old female who is a primary care patient of Jacqueline Craze, NP. The Care Management team Engaged with patient Engaged with patient by telephone to assess and address transitions of care needs.   Consent to Services:  Patient was given information about care management services, agreed to services, and gave verbal consent to participate.   Assessment:   Patient reports she continues to make progress.  Denies any signs or symptoms of new/extension of stroke.  Patient reports she has started her outpatient therapy to address residual weakness in her left leg from the stroke.  Patient reports having some difficulty falling asleep at night but once she is sleep she is able to rest well.  Reports she has a headache this morning and has taken Tylenol ES 500 mg 1 tablet for her headache.  Patient has a history of migraine headaches and is on Topamax for her migraines.  Patient had not yet scheduled her follow up appointment with her PCP and RNCM, with the assistance of scheduling care guides, was able to schedule patient's follow-up PCP appointment for 04/03/23 at 10:40 AM during our telephone call today.  No acute or unmet needs.  TOC RNCM will continue to follow and assist in post-hospitalization recovery.       SDOH Interventions    Flowsheet Row Telephone from 03/20/2023 in [place] POP[facility]ATION [facility] DEPARTMENT  SDOH Interventions   Food Insecurity Interventions Intervention Not Indicated  Housing Interventions Intervention Not Indicated  Transportation Interventions Intervention Not Indicated  Utilities Interventions Intervention Not Indicated        Goals Addressed             This Visit's Progress    Transition of Care       PCurrent Barriers:  Knowledge Deficits related to  plan of care for management of HLD and HTN , and decreasing risk factors, functional decline after Stroke  RNCM Clinical Goal(s):  Patient will work with the Care Management team over the next 30 days to address Transition of Care Barriers:  Medication Management, Diet/Nutrition/Food Resources, Provider appointments, Equipment/DME, Functional/Safety. Verbalize understanding of plan for management of HTN and HLD as evidenced by patient able to verbalize understanding of adequate control of blood pressure and cholesterol correlate with increased risk for strokes Take all medications exactly as prescribed and will call provider for medication related questions as evidenced by review of EMR and report from patient/caregiver through collaboration with RN Care manager, provider, and care team.  Patient will continue to remain engaged with and participate in her outpatient therapy rehabilitation services  Interventions: Evaluation of current treatment plan related to  self management and patient's adherence to plan as established by provider.  Transitions of Care:  Goal on track:  Yes. Durable Medical Equipment (DME) education provided and re-emphasized safe use of walker in the home and community                                                                                                                                                                                                                                                                                                                                                                                                                                               RNCM engaged with scheduling care guides  during today's call to schedule follow-up appointment with PCP, Jacqueline [name] for 04/03/23 at 10:40 AM. Full medication profile review completed.  Patient is currently not taking her Crestor medication.  States she plans to talk with her PCP on 04/03/23 before resuming this medication.   Stroke:  (Status:Goal on track:  Yes.) Long Term Goal Reviewed upcoming doctor appointments.  RNCM engaged with scheduling care guides during today's call to schedule follow-up appointment with PCP, Jacqueline [name].  Advised patient to discuss her difficulty falling asleep at night and ongoing migraine headaches with PCP at her follow-up visit on Friday, 04/03/23. RNCM assessed for signs and symptoms of stroke - patient denies visual changes, blurred vision, slurred speech, sudden trouble speaking or numbness, weakness in face, arm or leg. Reviewed referrals to outpatient therapy for  physical, occupational and speech therapy.  Patient reports she has started her outpatient therapy and future appointments have been scheduled. Reviewed most recent blood pressure readings: 115-116 / 60's.  Patient keeping log of readings and plans to take the log to her next appointment. Reviewed blood sugar from yesterday evening was 140 after supper and popcorn at a movie.  Patient keeping log of readings and plans to take the log to her next appointment.   Patient Goals/Self-Care Activities: Participate in Transition of Care Program/Attend [facility] scheduled calls Notify RN Care Manager of TOC call rescheduling needs Take all medications as prescribed Attend all scheduled provider appointments Call provider office for new concerns or questions  Call 911 immediately if you develop signs and symptoms of stroke check blood pressure  and blood sugars daily, write the readings in a log and take it with you to your next appointment,                                                                                                                                                                                                                                                                                                                                                                                                       Follow Up Plan:  Telephone follow up appointment with care management team member scheduled for:  04/08/23 at 10 AM.  The patient has been provided with contact information for the care management team and has been advised to call with any health related questions or concerns.   Next PCP appointment scheduled for: Jacqueline [name] for 04/03/23  at 10:40 AM.          Plan: Telephone follow up appointment with care management team member scheduled for: 04/08/23 at 10 AM  Cristin Szatkowski Samuel Bouche, RN RN Care Manager, Transition of Care [facility], Population Health Direct Dial:  [phone] / Fax: [phone] Website: [email or web address]

## 2023-04-03 ENCOUNTER — Other Ambulatory Visit: Payer: Self-pay | Admitting: Family

## 2023-04-03 ENCOUNTER — Encounter: Payer: Self-pay | Admitting: Family

## 2023-04-03 ENCOUNTER — Ambulatory Visit (INDEPENDENT_AMBULATORY_CARE_PROVIDER_SITE_OTHER): Payer: Medicare (Managed Care) | Admitting: Family

## 2023-04-03 ENCOUNTER — Other Ambulatory Visit: Payer: Self-pay | Admitting: *Deleted

## 2023-04-03 VITALS — BP 117/62 | HR 77 | Temp 97.7°F | Resp 16 | Ht 65.0 in | Wt 160.0 lb

## 2023-04-03 DIAGNOSIS — K5900 Constipation, unspecified: Secondary | ICD-10-CM | POA: Diagnosis not present

## 2023-04-03 DIAGNOSIS — Z8673 Personal history of transient ischemic attack (TIA), and cerebral infarction without residual deficits: Secondary | ICD-10-CM

## 2023-04-03 DIAGNOSIS — Z794 Long term (current) use of insulin: Secondary | ICD-10-CM | POA: Diagnosis not present

## 2023-04-03 DIAGNOSIS — E1142 Type 2 diabetes mellitus with diabetic polyneuropathy: Secondary | ICD-10-CM

## 2023-04-03 DIAGNOSIS — I6529 Occlusion and stenosis of unspecified carotid artery: Secondary | ICD-10-CM

## 2023-04-03 DIAGNOSIS — I10 Essential (primary) hypertension: Secondary | ICD-10-CM

## 2023-04-03 DIAGNOSIS — Z23 Encounter for immunization: Secondary | ICD-10-CM | POA: Diagnosis not present

## 2023-04-03 DIAGNOSIS — D649 Anemia, unspecified: Secondary | ICD-10-CM

## 2023-04-03 DIAGNOSIS — I639 Cerebral infarction, unspecified: Secondary | ICD-10-CM

## 2023-04-03 DIAGNOSIS — E785 Hyperlipidemia, unspecified: Secondary | ICD-10-CM | POA: Diagnosis not present

## 2023-04-03 DIAGNOSIS — Z1231 Encounter for screening mammogram for malignant neoplasm of breast: Secondary | ICD-10-CM

## 2023-04-03 LAB — COMPREHENSIVE METABOLIC PANEL
ALT: 9 U/L (ref 0–35)
AST: 13 U/L (ref 0–37)
Albumin: 4.6 g/dL (ref 3.5–5.2)
Alkaline Phosphatase: 68 U/L (ref 39–117)
BUN: 18 mg/dL (ref 6–23)
CO2: 25 meq/L (ref 19–32)
Calcium: 9.7 mg/dL (ref 8.4–10.5)
Chloride: 107 meq/L (ref 96–112)
Creatinine, Ser: 1 mg/dL (ref 0.40–1.20)
GFR: 55.75 mL/min — ABNORMAL LOW (ref >60.00)
Glucose, Bld: 115 mg/dL — ABNORMAL HIGH (ref 70–99)
Potassium: 5.1 meq/L (ref 3.5–5.1)
Sodium: 141 meq/L (ref 135–145)
Total Bilirubin: 0.6 mg/dL (ref 0.2–1.2)
Total Protein: 6.9 g/dL (ref 6.0–8.3)

## 2023-04-03 LAB — CBC WITH DIFFERENTIAL/PLATELET
Basophils Absolute: 0 10*3/uL (ref 0.0–0.1)
Basophils Relative: 0.5 % (ref 0.0–3.0)
Eosinophils Absolute: 0.3 10*3/uL (ref 0.0–0.7)
Eosinophils Relative: 4.2 % (ref 0.0–5.0)
HCT: 40.6 % (ref 36.0–46.0)
Hemoglobin: 13.4 g/dL (ref 12.0–15.0)
Lymphocytes Relative: 21.3 % (ref 12.0–46.0)
Lymphs Abs: 1.4 10*3/uL (ref 0.7–4.0)
MCHC: 33.1 g/dL (ref 30.0–36.0)
MCV: 88.9 fL (ref 78.0–100.0)
Monocytes Absolute: 0.5 10*3/uL (ref 0.1–1.0)
Monocytes Relative: 7.9 % (ref 3.0–12.0)
Neutro Abs: 4.3 10*3/uL (ref 1.4–7.7)
Neutrophils Relative %: 66.1 % (ref 43.0–77.0)
Platelets: 220 10*3/uL (ref 150.0–400.0)
RBC: 4.56 Mil/uL (ref 3.87–5.11)
RDW: 13.6 % (ref 11.5–15.5)
WBC: 6.6 10*3/uL (ref 4.0–10.5)

## 2023-04-03 NOTE — Assessment & Plan Note (Signed)
Lab Results  Component Value Date   HGBA1C 7.3 (H) 03/13/2023   HGBA1C 7.4 (A) 10/21/2022   HGBA1C 7.3 (A) 04/11/2022   Lab Results  Component Value Date   MICROALBUR 41.2 (H) 05/30/2022   LDLCALC 126 (H) 03/13/2023   CREATININE 1.00 04/03/2023   A1C slightly above goal. She is followed by Endocrinology. They plan to follow up with endo re: possibility of GLP-1 which was discussed at her last visit.

## 2023-04-03 NOTE — Assessment & Plan Note (Signed)
  Patient reports constipation since hospitalization. -Increase water and fiber intake. -Continue stool softeners. -Consider adding MiraLAX as needed.

## 2023-04-03 NOTE — Assessment & Plan Note (Addendum)
BP stable today, not currently on anti-hypertensive.

## 2023-04-03 NOTE — Assessment & Plan Note (Signed)
Lab Results  Component Value Date   CHOL 198 03/13/2023   HDL 50 03/13/2023   LDLCALC 126 (H) 03/13/2023   TRIG 109 03/13/2023   CHOLHDL 4.0 03/13/2023   LDL goal is <70.  She continues crestor 40mg . Will see how she does with addition of zetia.
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
## 2023-04-03 NOTE — Assessment & Plan Note (Addendum)
S/p TKA, noted to have 75 percent narrowing of the Right ICA, uncerwent right transcarotid artery stenting.  Since discharge she has very little remaining deficits- perhaps slightly more forgetful per her husband. Continue aspirin, plavix, statin, strict DM control, bp control for secondary stroke prevention.

## 2023-04-03 NOTE — Patient Instructions (Signed)
VISIT SUMMARY:  Jacqueline [name], a 73 year old female with a history of stroke, came in for a follow-up after her recent hospitalization due to a stroke. She has been experiencing weakness, forgetfulness, and constipation since her discharge. Her heart rate has improved, and she is on medications for stroke prevention, cholesterol management, and acid reflux. She also reported difficulty sleeping.  YOUR PLAN:  -ISCHEMIC STROKE: An ischemic stroke occurs when a blood vessel supplying blood to the brain is obstructed. You will continue taking aspirin 81mg  and clopidogrel as prescribed. Please follow up with neurology for ongoing management and monitoring of the narrowing in your right internal carotid artery.  -HYPERTENSION: Hypertension is high blood pressure. Your blood pressure is well controlled at home, so you should continue your current antihypertensive regimen.  -HYPERLIPIDEMIA: Hyperlipidemia is having high levels of fats (lipids) in the blood, such as cholesterol. Your LDL level is 126, and the goal is to get it below 70 to prevent another stroke. Continue taking rosuvastatin 40mg  and Zetia as prescribed. We may consider adding Repatha if your LDL remains above 70.  -TYPE 2 DIABETES MELLITUS: Type 2 diabetes is a condition that affects the way your body processes blood sugar (glucose). Your last A1c was 7.3, which is close to the goal of less than 7. Continue your current diabetes management and follow up with your endocrinologist in March to discuss the potential addition of Ozempic.  -CONSTIPATION: Constipation is when you have infrequent or difficult bowel movements. Increase your water and fiber intake, continue using stool softeners, and consider adding MiraLAX as needed.  -INSOMNIA: Insomnia is difficulty falling or staying asleep. Try taking over-the-counter melatonin 1mg  at bedtime to help with your sleep.  -GENERAL HEALTH MAINTENANCE: You received your influenza vaccine today. We  will order a mammogram and schedule it at your convenience. Your blood count will be checked today. Please follow up in 3 months. Also, obtain information on your previous colonoscopy location and schedule a referral for a repeat colonoscopy if it has been more than 10 years since your last one.  INSTRUCTIONS:  Please follow up with neurology for ongoing management and monitoring of the narrowing in your right internal carotid artery. Follow up with your endocrinologist in March to discuss the potential addition of Ozempic. Obtain information on your previous colonoscopy location and schedule a referral for a repeat colonoscopy if it has been more than 10 years since your last one. Follow up in 3 months.

## 2023-04-04 ENCOUNTER — Other Ambulatory Visit: Payer: Self-pay | Admitting: Internal Medicine

## 2023-04-04 DIAGNOSIS — E1142 Type 2 diabetes mellitus with diabetic polyneuropathy: Secondary | ICD-10-CM

## 2023-04-06 ENCOUNTER — Other Ambulatory Visit: Payer: Self-pay

## 2023-04-06 ENCOUNTER — Encounter (HOSPITAL_COMMUNITY): Payer: Self-pay

## 2023-04-06 ENCOUNTER — Emergency Department (HOSPITAL_COMMUNITY)
Admission: EM | Admit: 2023-04-06 | Discharge: 2023-04-06 | Disposition: A | Discharge status: Home / Self Care | Payer: Medicare (Managed Care) | Attending: Emergency Medicine | Admitting: Emergency Medicine

## 2023-04-06 ENCOUNTER — Emergency Department (HOSPITAL_COMMUNITY): Payer: Medicare (Managed Care)

## 2023-04-06 ENCOUNTER — Ambulatory Visit: Payer: Medicare (Managed Care) | Admitting: Physical Therapy

## 2023-04-06 VITALS — BP 192/103 | HR 84

## 2023-04-06 DIAGNOSIS — Z91148 Patient's other noncompliance with medication regimen for other reason: Secondary | ICD-10-CM | POA: Diagnosis present

## 2023-04-06 DIAGNOSIS — E785 Hyperlipidemia, unspecified: Secondary | ICD-10-CM | POA: Diagnosis not present

## 2023-04-06 DIAGNOSIS — Z8673 Personal history of transient ischemic attack (TIA), and cerebral infarction without residual deficits: Secondary | ICD-10-CM | POA: Diagnosis not present

## 2023-04-06 DIAGNOSIS — R2689 Other abnormalities of gait and mobility: Secondary | ICD-10-CM | POA: Diagnosis not present

## 2023-04-06 DIAGNOSIS — E119 Type 2 diabetes mellitus without complications: Secondary | ICD-10-CM | POA: Insufficient documentation

## 2023-04-06 DIAGNOSIS — R29898 Other symptoms and signs involving the musculoskeletal system: Secondary | ICD-10-CM | POA: Diagnosis not present

## 2023-04-06 DIAGNOSIS — Z7902 Long term (current) use of antithrombotics/antiplatelets: Secondary | ICD-10-CM | POA: Insufficient documentation

## 2023-04-06 DIAGNOSIS — I10 Essential (primary) hypertension: Secondary | ICD-10-CM | POA: Insufficient documentation

## 2023-04-06 DIAGNOSIS — I6782 Cerebral ischemia: Secondary | ICD-10-CM | POA: Insufficient documentation

## 2023-04-06 DIAGNOSIS — R9431 Abnormal electrocardiogram [ECG] [EKG]: Secondary | ICD-10-CM | POA: Insufficient documentation

## 2023-04-06 DIAGNOSIS — Z794 Long term (current) use of insulin: Secondary | ICD-10-CM | POA: Insufficient documentation

## 2023-04-06 DIAGNOSIS — N3091 Cystitis, unspecified with hematuria: Secondary | ICD-10-CM | POA: Insufficient documentation

## 2023-04-06 DIAGNOSIS — R2681 Unsteadiness on feet: Secondary | ICD-10-CM

## 2023-04-06 DIAGNOSIS — R531 Weakness: Secondary | ICD-10-CM | POA: Insufficient documentation

## 2023-04-06 DIAGNOSIS — Z7984 Long term (current) use of oral hypoglycemic drugs: Secondary | ICD-10-CM | POA: Diagnosis not present

## 2023-04-06 DIAGNOSIS — Z7982 Long term (current) use of aspirin: Secondary | ICD-10-CM | POA: Diagnosis not present

## 2023-04-06 DIAGNOSIS — Z79899 Other long term (current) drug therapy: Secondary | ICD-10-CM | POA: Diagnosis not present

## 2023-04-06 LAB — CBC
HCT: 39.7 % (ref 36.0–46.0)
Hemoglobin: 12.9 g/dL (ref 12.0–15.0)
MCH: 29 pg (ref 26.0–34.0)
MCHC: 32.5 g/dL (ref 30.0–36.0)
MCV: 89.2 fL (ref 80.0–100.0)
Platelets: 209 10*3/uL (ref 150–400)
RBC: 4.45 MIL/uL (ref 3.87–5.11)
RDW: 12.7 % (ref 11.5–15.5)
WBC: 5.5 10*3/uL (ref 4.0–10.5)
nRBC: 0 % (ref 0.0–0.2)

## 2023-04-06 LAB — URINALYSIS, ROUTINE W REFLEX MICROSCOPIC
Bilirubin Urine: NEGATIVE
Glucose, UA: NEGATIVE mg/dL
Ketones, ur: NEGATIVE mg/dL
Nitrite: POSITIVE — AB
Protein, ur: NEGATIVE mg/dL
Specific Gravity, Urine: 1.011 (ref 1.005–1.030)
pH: 5 (ref 5.0–8.0)

## 2023-04-06 LAB — COMPREHENSIVE METABOLIC PANEL
ALT: 12 U/L (ref 0–44)
AST: 15 U/L (ref 15–41)
Albumin: 4.1 g/dL (ref 3.5–5.0)
Alkaline Phosphatase: 61 U/L (ref 38–126)
Anion gap: 12 (ref 5–15)
BUN: 15 mg/dL (ref 8–23)
CO2: 20 mmol/L — ABNORMAL LOW (ref 22–32)
Calcium: 9.6 mg/dL (ref 8.9–10.3)
Chloride: 109 mmol/L (ref 98–111)
Creatinine, Ser: 0.97 mg/dL (ref 0.44–1.00)
GFR, Estimated: >60 mL/min (ref >60)
Glucose, Bld: 169 mg/dL — ABNORMAL HIGH (ref 70–99)
Potassium: 4.5 mmol/L (ref 3.5–5.1)
Sodium: 141 mmol/L (ref 135–145)
Total Bilirubin: 0.8 mg/dL (ref 0.0–1.2)
Total Protein: 6.7 g/dL (ref 6.5–8.1)

## 2023-04-06 LAB — I-STAT CHEM 8, ED
BUN: 15 mg/dL (ref 8–23)
Calcium, Ion: 1.04 mmol/L — ABNORMAL LOW (ref 1.15–1.40)
Chloride: 111 mmol/L (ref 98–111)
Creatinine, Ser: 1 mg/dL (ref 0.44–1.00)
Glucose, Bld: 166 mg/dL — ABNORMAL HIGH (ref 70–99)
HCT: 38 % (ref 36.0–46.0)
Hemoglobin: 12.9 g/dL (ref 12.0–15.0)
Potassium: 4.3 mmol/L (ref 3.5–5.1)
Sodium: 139 mmol/L (ref 135–145)
TCO2: 19 mmol/L — ABNORMAL LOW (ref 22–32)

## 2023-04-06 LAB — RAPID URINE DRUG SCREEN, HOSP PERFORMED
Amphetamines: NOT DETECTED
Barbiturates: NOT DETECTED
Benzodiazepines: NOT DETECTED
Cocaine: NOT DETECTED
Opiates: NOT DETECTED
Tetrahydrocannabinol: NOT DETECTED

## 2023-04-06 LAB — ETHANOL: Alcohol, Ethyl (B): <10 mg/dL (ref <10)

## 2023-04-06 LAB — PROTIME-INR
INR: 1 (ref 0.8–1.2)
Prothrombin Time: 13.3 s (ref 11.4–15.2)

## 2023-04-06 LAB — DIFFERENTIAL
Abs Immature Granulocytes: 0.02 10*3/uL (ref 0.00–0.07)
Basophils Absolute: 0 10*3/uL (ref 0.0–0.1)
Basophils Relative: 1 %
Eosinophils Absolute: 0.3 10*3/uL (ref 0.0–0.5)
Eosinophils Relative: 5 %
Immature Granulocytes: 0 %
Lymphocytes Relative: 26 %
Lymphs Abs: 1.4 10*3/uL (ref 0.7–4.0)
Monocytes Absolute: 0.5 10*3/uL (ref 0.1–1.0)
Monocytes Relative: 9 %
Neutro Abs: 3.2 10*3/uL (ref 1.7–7.7)
Neutrophils Relative %: 59 %

## 2023-04-06 LAB — APTT: aPTT: 26 s (ref 24–36)

## 2023-04-06 MED ORDER — NITROFURANTOIN MONOHYD MACRO 100 MG PO CAPS: 100.0000 mg | ORAL_CAPSULE | Freq: Two times a day (BID) | ORAL | 0 refills | Status: DC

## 2023-04-06 NOTE — Discharge Instructions (Addendum)
 Your blood pressure has come back down to 136/77 without intervention today.  The elevated blood pressure earlier today may have been from stress or a poor reading.   Your urine today showed that you likely have a UTI.  You will be treated for UTI with a medication called Macrobid (nitrofurantoin).  Please take this twice daily for the next 5 days.  I have also sent your urine off for a culture.  You will be contacted if your antibiotic needs to be changed.   Please discontinue your Azo after today to ensure your UTI symptoms are improving.  Your blood counts and electrolytes are normal today.  Your glucose is slightly elevated at 169 today.  Please continue to have this monitored by your PCP as this could be a sign of poorly controlled diabetes.  The CT of your head does not show any new abnormalities.  No signs of a new stroke today.  We do see the signs of the old stroke on your CT.  I have included the CT read below please make your PCP aware of the CT findings.  Please follow-up with your PCP within the next week for symptom recheck.  Please return to the ER if you develop any please return to the ER if you develop any vision changes, unilateral weakness, slurred speech, facial droop, any other new or concerning symptoms.   "EXAM: CT HEAD WITHOUT CONTRAST   TECHNIQUE: Contiguous axial images were obtained from the base of the skull through the vertex without intravenous contrast.   RADIATION DOSE REDUCTION: This exam was performed according to the departmental dose-optimization program which includes automated exposure control, adjustment of the mA and/or kV according to patient size and/or use of iterative reconstruction technique.   COMPARISON:  MRI brain 03/13/2023.  CT head 03/13/2023.   FINDINGS: Brain: No acute intracranial hemorrhage. No CT evidence of acute infarct. Similar hypoattenuating foci in the right periventricular white matter extending into the centrum semiovale  likely reflecting remote lacunar infarcts. Additional areas of mild chronic microvascular ischemic changes. No edema, mass effect, or midline shift. The basilar cisterns are patent.   Ventricles: Prominence of the ventricles suggesting underlying parenchymal volume loss.   Vascular: No hyperdense vessel or unexpected calcification.   Skull: No acute or aggressive finding.   Orbits: Orbits are symmetric.   Sinuses: Mild mucosal thickening in the ethmoid sinuses.   Other: Mastoid air cells are clear.   IMPRESSION: No CT evidence of acute intracranial abnormality.   Mild chronic microvascular ischemic changes. Remote lacunar infarcts in the right periventricular white matter.     Electronically Signed   By: Emily Filbert M.D.   On: 04/06/2023 14:41"

## 2023-04-06 NOTE — ED Provider Triage Note (Signed)
 Emergency Medicine Provider Triage Evaluation Note  Jacqueline [name] , a 74 y.o. female  was evaluated in triage.  Pt complains of new onset right leg weakness that began sometime yesterday.  Patient reports history of 2 CVAs.  States that 2 years ago she had CVA with resulting left leg weakness.  Reports that she also had CVA 3 weeks ago, unsure of deficits.  Reports that she is compliant on blood thinning medication.  States that yesterday she noticed some new right leg weakness and then today at physical therapy her physical therapist also noticed that she had weakness in her right leg and also had high blood pressure.  She denies a history of high blood pressure but does have losartan listed in her medications.  She denies any chest pain, shortness of breath, headache, blurred vision.  Reports that her right leg weakness has resolved at this time.  Denies any right sided numbness, right upper extremity weakness.  Denies any drooping of her face, slurred speech.  Review of Systems  Positive: Right leg weakness Negative: Right leg numbness, right upper EXTR and weakness or numbness  Physical Exam  BP (!) 161/83   Pulse 82   Temp 97.7 F (36.5 C)   Resp 17   Ht 5\' 5"  (1.651 m)   Wt 72.6 kg   SpO2 96%   BMI 26.63 kg/m  Gen:   Awake, no distress   Resp:  Normal effort  MSK:   Moves extremities without difficulty  Other:  CN III through XII intact.  Intact finger-nose, heel-to-shin.  No pronator drift, no slurred speech.  5 out of 5 strength bilateral lower extremities.  5 out of 5 strength bilateral upper extremities.  No subjective numbness.  Medical Decision Making  Medically screening exam initiated at 12:29 PM.  Appropriate orders placed.  Jacqueline [name] was informed that the remainder of the evaluation will be completed by another provider, this initial triage assessment does not replace that evaluation, and the importance of remaining in the ED until their evaluation is complete.  Work  up initiated.  Patient reports last known well was yesterday putting her outside of any kind of window.   Al Decant, PA-C 04/06/23 1231

## 2023-04-06 NOTE — ED Provider Notes (Signed)
  EMERGENCY DEPARTMENT AT [facility] Provider Note   CSN: [id number] Arrival date & time: 04/06/23  1128     History  No chief complaint on file.   Jacqueline [name] is a 73 y.o. female with history of stroke who recently underwent revascularization on 03/16/2023, hypertension, diabetes, hyperlipidemia, presents with concern for elevated blood pressure that was found at her physical therapy appointment this morning.  States her physical therapist checked her blood pressure and it was elevated in the systolic of 180s.  States she normally runs in the systolic of 120s at home.  She states she may have felt a little bit more generally weak in her legs last night into this morning.  Denies any headaches, chest pain, shortness of breath,vision changes, slurred speech, facial droop, unilateral weakness.  She reports she has been compliant with her medications at home.  She started taking Azo at home for the past couple days because she thought she may have a UTI.  HPI     Home Medications Prior to Admission medications   Medication Sig Start Date End Date Taking? Authorizing Provider  nitrofurantoin, macrocrystal-monohydrate, (MACROBID) 100 MG capsule Take 1 capsule (100 mg total) by mouth 2 (two) times daily. 04/06/23  Yes Arabella Merles, PA-C  alendronate (FOSAMAX) 70 MG tablet Take 1 tablet (70 mg total) by mouth every 7 (seven) days. Take with a full glass of water on an empty stomach. 09/10/22   Sandford Craze, NP  aspirin EC 81 MG tablet Take 1 tablet (81 mg total) by mouth daily. Swallow whole. 03/20/23   de Saintclair Halsted, Cortney E, NP  bisacodyl (DULCOLAX) 10 MG suppository Place 1 suppository (10 mg total) rectally daily as needed for moderate constipation. 03/19/23   de Saintclair Halsted, Cortney E, NP  Cholecalciferol (VITAMIN D3) 75 MCG (3000 UT) TABS Take 1 tablet by mouth daily at 6 (six) AM. 04/16/22   Sandford Craze, NP  clopidogrel (PLAVIX) 75 MG tablet Take 1 tablet (75  mg total) by mouth daily. 03/20/23   de Saintclair Halsted, Cortney E, NP  ezetimibe (ZETIA) 10 MG tablet Take 1 tablet (10 mg total) by mouth at bedtime. 03/19/23   de Saintclair Halsted, Cortney E, NP  glucose blood (ACCU-CHEK GUIDE) test strip USE AS INSTRUCTED TO TEST BLOOD SUGARS 3 TIMES DAILY E11.65 Patient taking differently: 1 each by Other route as needed for other. USE AS INSTRUCTED TO TEST BLOOD SUGARS 3 TIMES DAILY E11.65 06/24/22   Shamleffer, Konrad Dolores, MD  insulin glargine (LANTUS) 100 UNIT/ML injection INJECT 20 UNITS INTO THE SKIN DAILY 01/14/22   Shamleffer, Konrad Dolores, MD  Insulin Pen Needle (BD PEN NEEDLE NANO 2ND GEN) 32G X 4 MM MISC Inject 1 Device into the skin daily in the afternoon. USE DAILY AS DIRECTED 04/29/21   Shamleffer, Konrad Dolores, MD  Lancets (ACCU-CHEK MULTICLIX) lancets Use as instructed to test blood sugars 3 times daily E11.65 10/04/18   Shamleffer, Konrad Dolores, MD  losartan (COZAAR) 50 MG tablet Take 1 tablet (50 mg total) by mouth daily. 04/03/23   Sandford Craze, NP  metFORMIN (GLUCOPHAGE) 1000 MG tablet TAKE 1 TABLET (1,000 MG TOTAL) BY MOUTH TWICE A DAY WITH FOOD 04/06/23   Shamleffer, Konrad Dolores, MD  pantoprazole (PROTONIX) 20 MG tablet Take 1 tablet (20 mg total) by mouth daily. 05/30/22   Sandford Craze, NP  rosuvastatin (CRESTOR) 40 MG tablet TAKE 1 TABLET BY MOUTH EVERYDAY AT BEDTIME 10/19/22   Sandford Craze, NP  topiramate (TOPAMAX) 50 MG tablet Take 1 tablet (50 mg total) by mouth 2 (two) times daily. 04/09/22   Ihor Austin, NP      Allergies    Augmentin [amoxicillin-pot clavulanate], Definity [perflutren lipid microsphere], Novocain [procaine], Promethazine, and Prozac [fluoxetine hcl]    Review of Systems   Review of Systems  Cardiovascular:  Negative for chest pain.    Physical Exam Updated Vital Signs BP 136/77   Pulse 72   Temp 97.8 F (36.6 C) (Oral)   Resp 16   Ht 5\' 5"  (1.651 m)   Wt 72.6 kg   SpO2 97%   BMI 26.63  kg/m  Physical Exam Vitals and nursing note reviewed.  Constitutional:      General: She is not in acute distress.    Appearance: She is well-developed.  HENT:     Head: Normocephalic and atraumatic.  Eyes:     Extraocular Movements: Extraocular movements intact.     Conjunctiva/sclera: Conjunctivae normal.     Pupils: Pupils are equal, round, and reactive to light.  Cardiovascular:     Rate and Rhythm: Normal rate and regular rhythm.     Heart sounds: No murmur heard.    Comments: 2+ radial pulse bilaterally   Pulmonary:     Effort: Pulmonary effort is normal. No respiratory distress.     Breath sounds: Normal breath sounds.  Abdominal:     Palpations: Abdomen is soft.     Tenderness: There is no abdominal tenderness.  Musculoskeletal:        General: No swelling.     Cervical back: Neck supple.  Skin:    General: Skin is warm and dry.     Capillary Refill: Capillary refill takes less than 2 seconds.     Comments: Well-healed surgical scar to the right upper chest wall.  Small suture protruding through the skin.  Neurological:     Mental Status: She is alert.     Comments: Cranial nerves III through XII intact No drift in the upper or lower extremities bilaterally 5/5 strength with resisted elbow flexion and extension bilaterally, resisted hip flexion and extension bilaterally, ankle plantarflexion dorsiflexion bilaterally  Intact finger-nose testing bilaterally.  Able to perform foot to shin testing bilaterally  Intact sensation in the distal upper extremities bilaterally and lower extremities bilaterally.  Psychiatric:        Mood and Affect: Mood normal.     ED Results / Procedures / Treatments   Labs (all labs ordered are listed, but only abnormal results are displayed) Labs Reviewed  COMPREHENSIVE METABOLIC PANEL - Abnormal; Notable for the following components:      Result Value   CO2 20 (*)    Glucose, Bld 169 (*)    All other components within normal  limits  URINALYSIS, ROUTINE W REFLEX MICROSCOPIC - Abnormal; Notable for the following components:   Color, Urine AMBER (*)    Hgb urine dipstick MODERATE (*)    Nitrite POSITIVE (*)    Leukocytes,Ua MODERATE (*)    Bacteria, UA FEW (*)    All other components within normal limits  I-STAT CHEM 8, ED - Abnormal; Notable for the following components:   Glucose, Bld 166 (*)    Calcium, Ion 1.04 (*)    TCO2 19 (*)    All other components within normal limits  URINE CULTURE  ETHANOL  PROTIME-INR  APTT  CBC  DIFFERENTIAL  RAPID URINE DRUG SCREEN, HOSP PERFORMED    EKG None  Radiology CT HEAD WO CONTRAST Result Date: 04/06/2023 CLINICAL DATA:  New right leg weakness, history of stroke. EXAM: CT HEAD WITHOUT CONTRAST TECHNIQUE: Contiguous axial images were obtained from the base of the skull through the vertex without intravenous contrast. RADIATION DOSE REDUCTION: This exam was performed according to the departmental dose-optimization program which includes automated exposure control, adjustment of the mA and/or kV according to patient size and/or use of iterative reconstruction technique. COMPARISON:  MRI brain 03/13/2023.  CT head 03/13/2023. FINDINGS: Brain: No acute intracranial hemorrhage. No CT evidence of acute infarct. Similar hypoattenuating foci in the right periventricular white matter extending into the centrum semiovale likely reflecting remote lacunar infarcts. Additional areas of mild chronic microvascular ischemic changes. No edema, mass effect, or midline shift. The basilar cisterns are patent. Ventricles: Prominence of the ventricles suggesting underlying parenchymal volume loss. Vascular: No hyperdense vessel or unexpected calcification. Skull: No acute or aggressive finding. Orbits: Orbits are symmetric. Sinuses: Mild mucosal thickening in the ethmoid sinuses. Other: Mastoid air cells are clear. IMPRESSION: No CT evidence of acute intracranial abnormality. Mild chronic  microvascular ischemic changes. Remote lacunar infarcts in the right periventricular white matter. Electronically Signed   By: Emily Filbert M.D.   On: 04/06/2023 14:41    Procedures Procedures    Medications Ordered in ED Medications - No data to display  ED Course/ Medical Decision Making/ A&P                                 Medical Decision Making    Differential diagnosis includes but is not limited to hypertension, hypertensive emergency, TIA, Stroke   ED Course:  Patient is very well-appearing, stable vital signs aside from a elevated blood pressure upon arrival at 161/83.  No neurological deficits.  Given patient reported feeling generally more weak for the past day, stroke workup was started by triage.  CT head without any new abnormalities. No concern for TIA or stroke. I Ordered, and personally interpreted labs.  The pertinent results include:   CBC without any abnormalities CMP with elevated glucose at 169, otherwise unremarkable APTT and PT/INR within normal limits Ethanol within normal limits UDS unremarkable Urinalysis with moderate hemoglobin and red blood cells, nitrites, and leukocytes.  No squamous epithelial cells. Upon re-evaluation, patient's blood pressure came down to 136/77 with no intervention.  Patient remains asymptomatic, feeling at baseline.  Since her urine did not show signs of UTI and patient was having urinary symptoms at home, will treat for acute cystitis with 5-day course of Macrobid.  Will send urine off for culture.  Patient states she has a follow-up with her PCP within the next week.  Encouraged her to attend this appointment.  Patient stable and appropriate for discharge home  Impression: Acute cystitis  Disposition:  The patient was discharged home with instructions to take 5-day course of Macrobid as prescribed.  Follow-up with PCP within the next week for recheck of symptoms and to discuss CT scan findings today Return precautions  given.  Imaging Studies ordered: I ordered imaging studies including CT head I independently visualized the imaging with scope of interpretation limited to determining acute life threatening conditions related to emergency care. Imaging showed  No acute intracranial abnormality, mild chronic microvascular ischemic changes.  Remote lacunar infarcts in the right periventricular white matter I agree with the radiologist interpretation   Cardiac Monitoring: / EKG: The patient was maintained on a cardiac monitor.  I personally viewed and interpreted the cardiac monitored which showed an underlying rhythm of: Sinus rhythm, no ST changes  External records from outside source obtained and reviewed including physical therapy note from today where blood pressure was recorded to be 187/87.  Previous hospitalization from 03/16/2023 where patient underwent transcarotid artery revascularization for stroke              Final Clinical Impression(s) / ED Diagnoses Final diagnoses:  Cystitis with hematuria    Rx / DC Orders ED Discharge Orders          Ordered    nitrofurantoin, macrocrystal-monohydrate, (MACROBID) 100 MG capsule  2 times daily        04/06/23 1558              Arabella Merles, [place] 04/06/23 1602    Ernie Avena, MD 04/06/23 1820
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
## 2023-04-06 NOTE — ED Triage Notes (Addendum)
 Pt sent by PT for HTN. Pt bp there was 187/87 and 192/103. Pt states when she walked into PT the physical therapist noticed her right leg was weak. Pt states the weakness has resolved. Pt denies HA, dizziness or vision changes.

## 2023-04-08 ENCOUNTER — Other Ambulatory Visit: Payer: Self-pay

## 2023-04-08 ENCOUNTER — Telehealth: Payer: Self-pay | Admitting: Family

## 2023-04-08 LAB — URINE CULTURE: Culture: >=100000 — AB

## 2023-04-08 MED ORDER — EZETIMIBE 10 MG PO TABS: 10.0000 mg | ORAL_TABLET | Freq: Every day | ORAL | 1 refills | Status: DC

## 2023-04-08 MED ORDER — ROSUVASTATIN CALCIUM 40 MG PO TABS
40.0000 mg | ORAL_TABLET | Freq: Every day | ORAL | 1 refills | Status: DC
Start: 2023-04-08 — End: 2023-10-21

## 2023-04-08 NOTE — Telephone Encounter (Signed)
-----   Message from Wyline Mood sent at 04/08/2023  2:29 PM EST ----- Regarding: Medication Refill x 2 Good Afternoon, I had my weekly telephone call with Mrs. Jacqueline [name], MRN [id number] today.  I am happy to report that she continues to show improvement each week!  She was very pleased to share that she has been walking around her home in 5 minute sessions daily and has ventured outside on those days the weather was really nice.  On Monday, 2/24 she did present to the emergency room from her physical therapy session after complaining of increased weakness and her physical therapist noticed her gait "was off".  After reporting increased urinary frequency, it was confirmed with a urine sample that she has an UTI and was started on a 5 day course of Macrobid.  [name] also asked me to pass along that she needs refills on her Rosuvastatin and Ezetimibe.  Her pharmacy is the [facility] on [address] in [place], [place].  My next telephone visit with [name] is scheduled for 04/16/23 at 1 PM.   Thank You!!  Angelene Daphine Deutscher BSN, RN RN Care Manager, Transition of Care [facility] / [facility], Population Health Direct Dial:  [phone] / Fax: [phone] Email:  [email or web address]

## 2023-04-08 NOTE — Progress Notes (Unsigned)
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 possible smart phrase technology. Any transcriptional errors that result from this process are unintentional.

## 2023-04-09 ENCOUNTER — Ambulatory Visit: Payer: Medicare (Managed Care) | Admitting: Adult Health

## 2023-04-09 ENCOUNTER — Encounter: Payer: Self-pay | Admitting: Adult Health

## 2023-04-09 ENCOUNTER — Ambulatory Visit: Payer: Medicare (Managed Care) | Admitting: Physical Therapy

## 2023-04-09 VITALS — BP 137/74 | HR 69 | Ht 65.0 in | Wt 159.0 lb

## 2023-04-09 DIAGNOSIS — I639 Cerebral infarction, unspecified: Secondary | ICD-10-CM | POA: Diagnosis not present

## 2023-04-09 DIAGNOSIS — G43109 Migraine with aura, not intractable, without status migrainosus: Secondary | ICD-10-CM | POA: Diagnosis not present

## 2023-04-09 DIAGNOSIS — I63239 Cerebral infarction due to unspecified occlusion or stenosis of unspecified carotid arteries: Secondary | ICD-10-CM | POA: Diagnosis not present

## 2023-04-09 DIAGNOSIS — R2689 Other abnormalities of gait and mobility: Secondary | ICD-10-CM

## 2023-04-09 DIAGNOSIS — G441 Vascular headache, not elsewhere classified: Secondary | ICD-10-CM | POA: Diagnosis not present

## 2023-04-09 DIAGNOSIS — R42 Dizziness and giddiness: Secondary | ICD-10-CM

## 2023-04-09 DIAGNOSIS — R2681 Unsteadiness on feet: Secondary | ICD-10-CM

## 2023-04-09 DIAGNOSIS — G629 Polyneuropathy, unspecified: Secondary | ICD-10-CM | POA: Diagnosis not present

## 2023-04-09 NOTE — Patient Instructions (Addendum)
 Continue working with therapies as scheduled  Continue topamax 50mg  twice daily for headaches - please call if headaches do not improve or worsen   Please monitor your feet symptoms, if this should worsen, please let me know   Continue aspirin 81 mg daily and clopidogrel 75 mg daily  and Zetia and Crestor for secondary stroke prevention  Ongoing use of both aspirin and Plavix duration will be determined by vascular surgery, please follow-up as scheduled on 3/11  Continue to follow up with PCP regarding blood pressure, cholesterol and diabetes management  Maintain strict control of hypertension with blood pressure goal below 130/90, diabetes with hemoglobin A1c goal below 7.0 % and cholesterol with LDL cholesterol (bad cholesterol) goal below 70 mg/dL.   Signs of a Stroke? Follow the BEFAST method:  Balance Watch for a sudden loss of balance, trouble with coordination or vertigo Eyes Is there a sudden loss of vision in one or both eyes? Or double vision?  Face: Ask the person to smile. Does one side of the face droop or is it numb?  Arms: Ask the person to raise both arms. Does one arm drift downward? Is there weakness or numbness of a leg? Speech: Ask the person to repeat a simple phrase. Does the speech sound slurred/strange? Is the person confused ? Time: If you observe any of these signs, call 911.     Followup in the future with me in 6 months or call earlier if needed      Thank you for coming to see [place] at [facility]. I hope we have been able to provide you high quality care today.  You may receive a patient satisfaction survey over the next few weeks. We would appreciate your feedback and comments so that we may continue to improve ourselves and the health of our patients.
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
## 2023-04-13 ENCOUNTER — Ambulatory Visit: Payer: Medicare (Managed Care) | Admitting: Physical Therapy

## 2023-04-13 ENCOUNTER — Other Ambulatory Visit: Payer: Self-pay

## 2023-04-13 ENCOUNTER — Encounter: Payer: Self-pay | Admitting: Speech Pathology

## 2023-04-13 ENCOUNTER — Ambulatory Visit: Payer: Medicare (Managed Care) | Admitting: Occupational Therapy

## 2023-04-13 ENCOUNTER — Encounter: Payer: Self-pay | Admitting: Physical Therapy

## 2023-04-13 ENCOUNTER — Ambulatory Visit: Payer: Medicare (Managed Care) | Attending: Neurology | Admitting: Speech Pathology

## 2023-04-13 VITALS — BP 151/74 | HR 71

## 2023-04-13 DIAGNOSIS — R2681 Unsteadiness on feet: Secondary | ICD-10-CM | POA: Diagnosis not present

## 2023-04-13 DIAGNOSIS — R2689 Other abnormalities of gait and mobility: Secondary | ICD-10-CM

## 2023-04-13 DIAGNOSIS — R41841 Cognitive communication deficit: Secondary | ICD-10-CM

## 2023-04-13 NOTE — Patient Instructions (Signed)
  Scrabble Anomia Tabu   Checkers Chess Connect 4 Uno  Card games Jig saw puzzles Easy cross words Memory match Board games Dominoes Majong Learn a new game!  Listen to and discuss Ted Talks or Podcasts Read and discuss short articles of interest to you- Take notes on these if memory is a challenge Discuss social media posts Look and discuss photo albums  The best activities to improve cognition are functional, real life activities that are important to you:  Plan a menu Participate in household chores and decisions (with supervision) Participate in managing finances Plan a party, trip or tailgate with all of the details (even if you aren't really going to carry it out) Participate in your hobby as you are able with assistance Manage your texts, emails with supervision if needed. Google search for items (even if you're not really going to buy anything) and compare prices and features Socialize -  however, too many visitors can be overwhelming, so set limits "My doctor said I should only visit (or talk) for 20 minutes" or "I do better when I visit with just 1-2 people at a time for 20 minutes"   Word finding activities - no timers  Scattergories [employer] - connections [place]'s up
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
## 2023-04-14 ENCOUNTER — Ambulatory Visit (HOSPITAL_COMMUNITY)
Admission: RE | Admit: 2023-04-14 | Discharge: 2023-04-14 | Disposition: A | Discharge status: Home / Self Care | Payer: Medicare (Managed Care) | Source: Ambulatory Visit | Attending: Vascular Surgery | Admitting: Vascular Surgery

## 2023-04-14 DIAGNOSIS — Z48812 Encounter for surgical aftercare following surgery on the circulatory system: Secondary | ICD-10-CM | POA: Insufficient documentation

## 2023-04-14 DIAGNOSIS — I6521 Occlusion and stenosis of right carotid artery: Secondary | ICD-10-CM | POA: Diagnosis present

## 2023-04-14 DIAGNOSIS — I6529 Occlusion and stenosis of unspecified carotid artery: Secondary | ICD-10-CM | POA: Diagnosis not present

## 2023-04-14 DIAGNOSIS — Z95820 Peripheral vascular angioplasty status with implants and grafts: Secondary | ICD-10-CM | POA: Diagnosis not present

## 2023-04-16 ENCOUNTER — Other Ambulatory Visit: Payer: Medicare (Managed Care)

## 2023-04-16 ENCOUNTER — Ambulatory Visit: Payer: Medicare (Managed Care) | Admitting: Physical Therapy

## 2023-04-20 ENCOUNTER — Ambulatory Visit: Payer: Medicare (Managed Care) | Admitting: Physical Therapy

## 2023-04-21 ENCOUNTER — Ambulatory Visit (INDEPENDENT_AMBULATORY_CARE_PROVIDER_SITE_OTHER): Payer: Medicare (Managed Care) | Admitting: Physician Assistant

## 2023-04-21 ENCOUNTER — Ambulatory Visit
Admission: EM | Admit: 2023-04-21 | Discharge: 2023-04-21 | Disposition: A | Discharge status: Home / Self Care | Payer: Medicare (Managed Care) | Attending: Family Medicine | Admitting: Family Medicine

## 2023-04-21 ENCOUNTER — Telehealth: Payer: Self-pay | Admitting: Urgent Care

## 2023-04-21 ENCOUNTER — Telehealth: Payer: Self-pay

## 2023-04-21 ENCOUNTER — Encounter: Payer: Self-pay | Admitting: Physician Assistant

## 2023-04-21 VITALS — BP 121/78 | HR 90 | Temp 98.7°F | Ht 65.0 in | Wt 156.5 lb

## 2023-04-21 DIAGNOSIS — N10 Acute tubulo-interstitial nephritis: Secondary | ICD-10-CM | POA: Diagnosis not present

## 2023-04-21 DIAGNOSIS — I6529 Occlusion and stenosis of unspecified carotid artery: Secondary | ICD-10-CM

## 2023-04-21 LAB — POCT URINALYSIS DIP (MANUAL ENTRY)
Glucose, UA: NEGATIVE mg/dL
Nitrite, UA: POSITIVE — AB
Protein Ur, POC: 100 mg/dL — AB
Spec Grav, UA: >=1.03 — AB (ref 1.010–1.025)
Urobilinogen, UA: 0.2 U/dL
pH, UA: 5 (ref 5.0–8.0)

## 2023-04-21 MED ORDER — CEFTRIAXONE SODIUM 1 G IJ SOLR
1.0000 g | Freq: Once | INTRAMUSCULAR | Status: AC
  Administered 2023-04-21: 1 g via INTRAMUSCULAR

## 2023-04-21 MED ORDER — CIPROFLOXACIN HCL 500 MG PO TABS: 500.0000 mg | ORAL_TABLET | Freq: Two times a day (BID) | ORAL | 0 refills | Status: DC

## 2023-04-21 NOTE — Telephone Encounter (Signed)
 Due to oversight I did not send prescription for ciprofloxacin to her pharmacy.  I do so now to the [facility] in [place].

## 2023-04-21 NOTE — Discharge Instructions (Addendum)
 Please start ciprofloxacin to address your kidney infection. You've already received the antibiotic injection in clinic. Make sure you hydrate very well with plain water and a quantity of 80 ounces of water a day.  Please limit drinks that are considered urinary irritants such as soda, sweet tea, coffee, energy drinks, alcohol.  These can worsen your urinary and genital symptoms but also be the source of them.  I will let you know about your urine culture results through MyChart to see if we need to prescribe or change your antibiotics based off of those results.

## 2023-04-21 NOTE — Progress Notes (Signed)
 POST OPERATIVE OFFICE NOTE    CC:  F/u for surgery  HPI:  This is a 73 y.o. female who is s/p right sided TCAR by Dr. Chestine [name] on 03/16/2023 due to symptomatic right ICA stenosis.  The patient has not experienced any further slurring speech, left facial droop which is how she presented to the emergency department.  She continues to take aspirin, Plavix, statin daily.  She is moving all extremities well.  She believes the right neck incision has completely healed.  She has occasional headaches involving both sides of her head however has history of periodic headaches and migraines even prior to surgery.  Allergies  Allergen Reactions   Augmentin [Amoxicillin-Pot Clavulanate] Other (See Comments)    Altered mental status Did it involve swelling of the face/tongue/throat, SOB, or low BP? No Did it involve sudden or severe rash/hives, skin peeling, or any reaction on the inside of your mouth or nose? Unknown Did you need to seek medical attention at a hospital or doctor's office? Unknown When did it last happen? unk       If all above answers are "NO", may proceed with cephalosporin use.    Definity [Perflutren Lipid Microsphere] Other (See Comments)    Headache and facial flushing after Definity IVP given. No SOB or Chest pain,and Vital signs stable. Jacqueline Hong, RN.   Novocain [Procaine] Other (See Comments)    Altered mental status   Promethazine Swelling    Tongue swelling   Prozac [Fluoxetine Hcl] Other (See Comments)    Lip swelling x 1 day after 1st dose    Current Outpatient Medications  Medication Sig Dispense Refill   alendronate (FOSAMAX) 70 MG tablet Take 1 tablet (70 mg total) by mouth every 7 (seven) days. Take with a full glass of water on an empty stomach. 12 tablet 4   aspirin EC 81 MG tablet Take 1 tablet (81 mg total) by mouth daily. Swallow whole. 30 tablet 12   Cholecalciferol (VITAMIN D3) 75 MCG (3000 UT) TABS Take 1 tablet by mouth daily at 6 (six) AM. 30 tablet     clopidogrel (PLAVIX) 75 MG tablet Take 1 tablet (75 mg total) by mouth daily. 30 tablet 1   ezetimibe (ZETIA) 10 MG tablet Take 1 tablet (10 mg total) by mouth at bedtime. 90 tablet 1   glucose blood (ACCU-CHEK GUIDE) test strip USE AS INSTRUCTED TO TEST BLOOD SUGARS 3 TIMES DAILY E11.65 (Patient taking differently: 1 each by Other route as needed for other. USE AS INSTRUCTED TO TEST BLOOD SUGARS 3 TIMES DAILY E11.65) 100 strip 3   insulin glargine (LANTUS) 100 UNIT/ML injection INJECT 20 UNITS INTO THE SKIN DAILY 10 mL 6   Insulin Pen Needle (BD PEN NEEDLE NANO 2ND GEN) 32G X 4 MM MISC Inject 1 Device into the skin daily in the afternoon. USE DAILY AS DIRECTED 100 each 3   Lancets (ACCU-CHEK MULTICLIX) lancets Use as instructed to test blood sugars 3 times daily E11.65 100 each 12   losartan (COZAAR) 50 MG tablet Take 1 tablet (50 mg total) by mouth daily. 90 tablet 0   metFORMIN (GLUCOPHAGE) 1000 MG tablet TAKE 1 TABLET (1,000 MG TOTAL) BY MOUTH TWICE A DAY WITH FOOD 180 tablet 1   nitrofurantoin, macrocrystal-monohydrate, (MACROBID) 100 MG capsule Take 1 capsule (100 mg total) by mouth 2 (two) times daily. 10 capsule 0   pantoprazole (PROTONIX) 20 MG tablet Take 1 tablet (20 mg total) by mouth daily. 90 tablet 1  rosuvastatin (CRESTOR) 40 MG tablet Take 1 tablet (40 mg total) by mouth daily. 90 tablet 1   topiramate (TOPAMAX) 50 MG tablet Take 1 tablet (50 mg total) by mouth 2 (two) times daily. 180 tablet 3   No current facility-administered medications for this visit.     ROS:  See HPI  Physical Exam:  Vitals:   04/21/23 1056 04/21/23 1057  BP: 133/79 121/78  Pulse: 90   Temp: 98.7 F (37.1 C)   TempSrc: Temporal   SpO2: 97%   Weight: 156 lb 8 oz (71 kg)   Height: 5\' 5"  (1.651 m)     Incision: Right neck incision well-healed Extremities: Moving all extremities well Neuro: Alert and oriented; cranial nerves grossly intact  Assessment/Plan:  This is a 73 y.o. female who is  s/p: Right sided TCAR due to symptomatic right ICA stenosis  [name] is a 73 year old female who suffered a right sided CVA.  She underwent right sided TCAR by Dr. Chestine [name] last month.  She denies any further strokelike symptoms.  She believes her symptoms of speech difficulty and left facial droop have resolved.  The right neck incision has completely healed.  Duplex demonstrates a widely patent right ICA stent.  Left ICA stenosis 1 to 39%.  She will continue her aspirin, Plavix, statin daily.  We will repeat carotid duplex in 6 months per protocol.   Jacqueline Rutter, PA-C [facility] [phone]  Clinic MD:  Jacqueline [name] was involved in the evaluation management plan of this patient today

## 2023-04-21 NOTE — ED Triage Notes (Signed)
 Pt reports low back pain and increase urinary frequency since last night.
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
## 2023-04-22 ENCOUNTER — Encounter: Payer: Self-pay | Admitting: Internal Medicine

## 2023-04-22 ENCOUNTER — Telehealth: Payer: Self-pay | Admitting: *Deleted

## 2023-04-22 ENCOUNTER — Ambulatory Visit: Payer: Medicare (Managed Care) | Admitting: Internal Medicine

## 2023-04-22 VITALS — BP 118/76 | HR 84 | Ht 65.0 in | Wt 155.4 lb

## 2023-04-22 DIAGNOSIS — E1159 Type 2 diabetes mellitus with other circulatory complications: Secondary | ICD-10-CM | POA: Diagnosis not present

## 2023-04-22 DIAGNOSIS — E1165 Type 2 diabetes mellitus with hyperglycemia: Secondary | ICD-10-CM | POA: Diagnosis not present

## 2023-04-22 DIAGNOSIS — Z794 Long term (current) use of insulin: Secondary | ICD-10-CM | POA: Diagnosis not present

## 2023-04-22 DIAGNOSIS — E038 Other specified hypothyroidism: Secondary | ICD-10-CM

## 2023-04-22 DIAGNOSIS — E1142 Type 2 diabetes mellitus with diabetic polyneuropathy: Secondary | ICD-10-CM

## 2023-04-22 LAB — POCT GLUCOSE (DEVICE FOR HOME USE): Glucose Fasting, POC: 171 mg/dL — AB (ref 70–99)

## 2023-04-22 LAB — T4, FREE: Free T4: 1.1 ng/dL (ref 0.8–1.8)

## 2023-04-22 LAB — TSH: TSH: 4.33 m[IU]/L (ref 0.40–4.50)

## 2023-04-22 MED ORDER — SEMAGLUTIDE(0.25 OR 0.5MG/DOS) 2 MG/3ML ~~LOC~~ SOPN: 0.5000 mg | PEN_INJECTOR | SUBCUTANEOUS | 3 refills | Status: DC

## 2023-04-22 NOTE — Patient Instructions (Addendum)
-   Start Ozempic 0.25 mg weekly for 6 weeks, than increase to 0.5 mg weekly  - Continue Metformin 1000 mg , 1 tablet Twice a day  - Continue Lantus/ Levemir 20 units daily    HOW TO TREAT LOW BLOOD SUGARS (Blood sugar LESS THAN 70 MG/DL) Please follow the RULE OF 15 for the treatment of hypoglycemia treatment (when your (blood sugars are less than 70 mg/dL)   STEP 1: Take 15 grams of carbohydrates when your blood sugar is low, which includes:  3-4 GLUCOSE TABS  OR 3-4 OZ OF JUICE OR REGULAR SODA OR ONE TUBE OF GLUCOSE GEL    STEP 2: RECHECK blood sugar in 15 MINUTES STEP 3: If your blood sugar is still low at the 15 minute recheck --> then, go back to STEP 1 and treat AGAIN with another 15 grams of carbohydrates.
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
## 2023-04-22 NOTE — Telephone Encounter (Signed)
 I was going through my list of Prolia patient and Jacqueline [name] was on it.  Her last injection was 01/22/22 and at in 02/2022 she has discuss that she think that Prolia caused her pain.  Did you wanted her to continue the Prolia or wait next bone density to discuss?  I did not see where it was continued after that.  Last bone density was 10/08/21.

## 2023-04-23 ENCOUNTER — Encounter (HOSPITAL_BASED_OUTPATIENT_CLINIC_OR_DEPARTMENT_OTHER): Payer: Self-pay

## 2023-04-23 ENCOUNTER — Ambulatory Visit (HOSPITAL_BASED_OUTPATIENT_CLINIC_OR_DEPARTMENT_OTHER)
Admission: RE | Admit: 2023-04-23 | Discharge: 2023-04-23 | Disposition: A | Discharge status: Home / Self Care | Payer: Medicare (Managed Care) | Source: Ambulatory Visit | Attending: Family | Admitting: Family

## 2023-04-23 DIAGNOSIS — Z1231 Encounter for screening mammogram for malignant neoplasm of breast: Secondary | ICD-10-CM | POA: Diagnosis present

## 2023-04-23 LAB — URINE CULTURE: Culture: >=100000 — AB

## 2023-04-24 ENCOUNTER — Ambulatory Visit (HOSPITAL_BASED_OUTPATIENT_CLINIC_OR_DEPARTMENT_OTHER)
Admission: RE | Admit: 2023-04-24 | Discharge: 2023-04-24 | Disposition: A | Discharge status: Home / Self Care | Payer: Medicare (Managed Care) | Source: Ambulatory Visit | Attending: Family Medicine | Admitting: Family Medicine

## 2023-04-24 ENCOUNTER — Ambulatory Visit (HOSPITAL_BASED_OUTPATIENT_CLINIC_OR_DEPARTMENT_OTHER): Payer: Medicare (Managed Care)

## 2023-04-24 ENCOUNTER — Encounter: Payer: Self-pay | Admitting: Family Medicine

## 2023-04-24 ENCOUNTER — Other Ambulatory Visit: Payer: Self-pay | Admitting: *Deleted

## 2023-04-24 ENCOUNTER — Ambulatory Visit: Payer: Self-pay | Admitting: Family

## 2023-04-24 ENCOUNTER — Ambulatory Visit: Payer: Medicare (Managed Care) | Admitting: Family Medicine

## 2023-04-24 VITALS — BP 130/62 | HR 78 | Temp 97.9°F | Resp 16 | Ht 65.0 in | Wt 153.6 lb

## 2023-04-24 DIAGNOSIS — R1012 Left upper quadrant pain: Secondary | ICD-10-CM | POA: Diagnosis present

## 2023-04-24 DIAGNOSIS — N159 Renal tubulo-interstitial disease, unspecified: Secondary | ICD-10-CM | POA: Insufficient documentation

## 2023-04-24 DIAGNOSIS — I6529 Occlusion and stenosis of unspecified carotid artery: Secondary | ICD-10-CM

## 2023-04-24 LAB — CBC WITH DIFFERENTIAL/PLATELET
Basophils Absolute: 0 10*3/uL (ref 0.0–0.1)
Basophils Relative: 0.3 % (ref 0.0–3.0)
Eosinophils Absolute: 0.1 10*3/uL (ref 0.0–0.7)
Eosinophils Relative: 3.2 % (ref 0.0–5.0)
HCT: 36.5 % (ref 36.0–46.0)
Hemoglobin: 12.5 g/dL (ref 12.0–15.0)
Lymphocytes Relative: 25 % (ref 12.0–46.0)
Lymphs Abs: 1.1 10*3/uL (ref 0.7–4.0)
MCHC: 34.2 g/dL (ref 30.0–36.0)
MCV: 84.9 fl (ref 78.0–100.0)
Monocytes Absolute: 0.5 10*3/uL (ref 0.1–1.0)
Monocytes Relative: 11.3 % (ref 3.0–12.0)
Neutro Abs: 2.6 10*3/uL (ref 1.4–7.7)
Neutrophils Relative %: 60.2 % (ref 43.0–77.0)
Platelets: 239 10*3/uL (ref 150.0–400.0)
RBC: 4.3 Mil/uL (ref 3.87–5.11)
RDW: 13.5 % (ref 11.5–15.5)
WBC: 4.4 10*3/uL (ref 4.0–10.5)

## 2023-04-24 LAB — POC URINALSYSI DIPSTICK (AUTOMATED)
Bilirubin, UA: NEGATIVE
Blood, UA: NEGATIVE
Glucose, UA: NEGATIVE
Ketones, UA: NEGATIVE
Nitrite, UA: NEGATIVE
Protein, UA: POSITIVE — AB
Spec Grav, UA: >=1.03 — AB (ref 1.010–1.025)
Urobilinogen, UA: 0.2 U/dL
pH, UA: 5 (ref 5.0–8.0)

## 2023-04-24 LAB — COMPREHENSIVE METABOLIC PANEL
ALT: 11 U/L (ref 0–35)
AST: 15 U/L (ref 0–37)
Albumin: 4.4 g/dL (ref 3.5–5.2)
Alkaline Phosphatase: 58 U/L (ref 39–117)
BUN: 12 mg/dL (ref 6–23)
CO2: 26 meq/L (ref 19–32)
Calcium: 9.4 mg/dL (ref 8.4–10.5)
Chloride: 105 meq/L (ref 96–112)
Creatinine, Ser: 1 mg/dL (ref 0.40–1.20)
GFR: 55.73 mL/min — ABNORMAL LOW (ref >60.00)
Glucose, Bld: 172 mg/dL — ABNORMAL HIGH (ref 70–99)
Potassium: 4.5 meq/L (ref 3.5–5.1)
Sodium: 141 meq/L (ref 135–145)
Total Bilirubin: 0.5 mg/dL (ref 0.2–1.2)
Total Protein: 6.7 g/dL (ref 6.0–8.3)

## 2023-04-24 MED ORDER — CEFTRIAXONE SODIUM 1 G IJ SOLR
1.0000 g | Freq: Once | INTRAMUSCULAR | Status: AC
Start: 2023-04-24 — End: 2023-04-24
  Administered 2023-04-24: 1 g via INTRAMUSCULAR
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
## 2023-04-24 NOTE — Addendum Note (Signed)
 Addended by: Thelma Barge D on: 04/24/2023 12:38 PM   Modules accepted: Orders

## 2023-04-24 NOTE — Telephone Encounter (Signed)
  Chief Complaint: kidney infection Symptoms: burning with urination, urinary frequency, left flank pain Frequency: diagnosed on 04/21/23 at urgent care, symptoms worsening since Pertinent Negatives: Patient denies fever, blood in urine, vomiting. Disposition: [] ED /[] Urgent Care (no appt availability in office) / [x] Appointment(In office/virtual)/ []  [facility] [facility]/ [] Home Care/ [] Refused Recommended Disposition /[] [place] [facility]/ []  Follow-up with PCP Additional Notes: Patient seen at [facility] urgent care on 04/21/23. Patient states she does not feel like the flank pain is getting worse, just the frequency and burning. Patient states pain is severe but has not taken Tylenol this morning and when she does take Tylenol pain improves. She states she has been trying to drink extra fluids. Patient agreeable to acute visit with available provider at PCP clinic today.  Copied from CRM [id number]. Topic: Clinical - Red Word Triage >> Apr 24, 2023  8:46 AM Pascal Lux wrote: Red Word that prompted transfer to Nurse Triage: Patient stated she has a kidney infection - buring and going to the restroom all night, could not sleep. Reason for Disposition  [1] Taking antibiotic > 72 hours (3 days) for UTI AND [2] flank or lower back pain is SAME (unchanged, not better)  Answer Assessment - Initial Assessment Questions 1. MAIN SYMPTOM: "What is the main symptom you are concerned about?" (e.g., painful urination, urine frequency)     Painful urination, urinary frequency.  2. BETTER-SAME-WORSE: "Are you getting better, staying the same, or getting worse compared to how you felt at your last visit to the doctor (most recent medical visit)?"     Worse.  3. PAIN: "How bad is the pain?"  (e.g., Scale 1-10; mild, moderate, or severe)   - MILD (1-3): complains slightly about urination hurting   - MODERATE (4-7): interferes with normal activities     - SEVERE (8-10): excruciating, unwilling or unable  to urinate because of the pain      When urinating it burns, 8-9/10. Left flank pain intermittent moderate pain.  4. FEVER: "Do you have a fever?" If Yes, ask: "What is it, how was it measured, and when did it start?"     Denies.  5. OTHER SYMPTOMS: "Do you have any other symptoms?" (e.g., blood in the urine, flank pain, vaginal discharge)     Flank pain, nausea.  6. DIAGNOSIS: "When was the UTI diagnosed?" "By whom?" "Was it a kidney infection, bladder infection or both?"     At Urgent care on 04/21/23 diagnosed with kidney infection.  7. ANTIBIOTIC: "What antibiotic(s) are you taking?" "How many times per day?"     Ciprofloxacin taking one tablet twice daily.  8. ANTIBIOTIC - START DATE: "When did you start taking the antibiotic?"     04/21/23.  Protocols used: Urinary Tract Infection on Antibiotic Follow-up Call - [facility]

## 2023-04-24 NOTE — Assessment & Plan Note (Signed)
 Culture pending  ? Kidney stone  Check CT Rocephin 1 gm IM Finish abx given previously

## 2023-04-25 ENCOUNTER — Other Ambulatory Visit: Payer: Self-pay | Admitting: Family Medicine

## 2023-04-25 ENCOUNTER — Encounter: Payer: Self-pay | Admitting: Family Medicine

## 2023-04-25 DIAGNOSIS — N200 Calculus of kidney: Secondary | ICD-10-CM

## 2023-04-25 LAB — URINE CULTURE
MICRO NUMBER:: 16202811
Result:: NO GROWTH
SPECIMEN QUALITY:: ADEQUATE

## 2023-04-25 MED ORDER — TAMSULOSIN HCL 0.4 MG PO CAPS: 0.4000 mg | ORAL_CAPSULE | Freq: Every day | ORAL | 3 refills | Status: DC

## 2023-04-26 NOTE — Telephone Encounter (Signed)
 Let's schedule her a follow up visit with me first please.

## 2023-04-27 ENCOUNTER — Ambulatory Visit: Payer: Medicare (Managed Care)

## 2023-04-27 DIAGNOSIS — R2689 Other abnormalities of gait and mobility: Secondary | ICD-10-CM

## 2023-04-27 DIAGNOSIS — R41841 Cognitive communication deficit: Secondary | ICD-10-CM | POA: Diagnosis not present

## 2023-04-27 DIAGNOSIS — R2681 Unsteadiness on feet: Secondary | ICD-10-CM
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
## 2023-04-27 NOTE — Telephone Encounter (Signed)
 Pt has appointment in may.

## 2023-04-28 ENCOUNTER — Encounter: Payer: Self-pay | Admitting: Family Medicine

## 2023-04-29 NOTE — Telephone Encounter (Signed)
Imaging has been faxed.

## 2023-05-04 ENCOUNTER — Encounter: Payer: Self-pay | Admitting: Physical Therapy

## 2023-05-04 ENCOUNTER — Ambulatory Visit: Payer: Medicare (Managed Care) | Admitting: Physical Therapy

## 2023-05-04 VITALS — BP 129/63 | HR 74

## 2023-05-04 DIAGNOSIS — R2681 Unsteadiness on feet: Secondary | ICD-10-CM

## 2023-05-04 DIAGNOSIS — R41841 Cognitive communication deficit: Secondary | ICD-10-CM | POA: Diagnosis not present

## 2023-05-04 DIAGNOSIS — R2689 Other abnormalities of gait and mobility: Secondary | ICD-10-CM
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
## 2023-05-20 ENCOUNTER — Ambulatory Visit (HOSPITAL_BASED_OUTPATIENT_CLINIC_OR_DEPARTMENT_OTHER)
Admission: RE | Admit: 2023-05-20 | Discharge: 2023-05-20 | Disposition: A | Discharge status: Home / Self Care | Payer: Medicare (Managed Care) | Source: Ambulatory Visit | Attending: Family | Admitting: Family

## 2023-05-20 ENCOUNTER — Encounter: Payer: Self-pay | Admitting: Family

## 2023-05-20 ENCOUNTER — Ambulatory Visit (INDEPENDENT_AMBULATORY_CARE_PROVIDER_SITE_OTHER): Payer: Medicare (Managed Care) | Admitting: Family

## 2023-05-20 VITALS — BP 120/52 | HR 66 | Temp 97.8°F | Resp 16 | Wt 151.0 lb

## 2023-05-20 DIAGNOSIS — N200 Calculus of kidney: Secondary | ICD-10-CM | POA: Insufficient documentation

## 2023-05-20 DIAGNOSIS — Z794 Long term (current) use of insulin: Secondary | ICD-10-CM

## 2023-05-20 DIAGNOSIS — E1159 Type 2 diabetes mellitus with other circulatory complications: Secondary | ICD-10-CM | POA: Diagnosis not present

## 2023-05-20 DIAGNOSIS — Z7985 Long-term (current) use of injectable non-insulin antidiabetic drugs: Secondary | ICD-10-CM

## 2023-05-20 LAB — BASIC METABOLIC PANEL WITH GFR
BUN: 16 mg/dL (ref 6–23)
CO2: 26 meq/L (ref 19–32)
Calcium: 9.7 mg/dL (ref 8.4–10.5)
Chloride: 110 meq/L (ref 96–112)
Creatinine, Ser: 1.08 mg/dL (ref 0.40–1.20)
GFR: 50.78 mL/min — ABNORMAL LOW (ref >60.00)
Glucose, Bld: 186 mg/dL — ABNORMAL HIGH (ref 70–99)
Potassium: 5.2 meq/L — ABNORMAL HIGH (ref 3.5–5.1)
Sodium: 143 meq/L (ref 135–145)

## 2023-05-20 NOTE — Assessment & Plan Note (Signed)
 Small left kidney stone with morning pain. Stone expected to pass with Flomax, though adherence is inconsistent. Abdominal x-ray is cost-effective for assessing passage. - Order abdominal x-ray to assess for kidney stone passage. - Encourage increased fluid intake. - Continue Flomax as prescribed.  Medication Side Effects Topamax may contribute to kidney stone formation. Neurologist to consider alternatives. - Send note to neurologist Derwood Kaplan regarding potential Topamax side effects and consider alternative medications.

## 2023-05-20 NOTE — Patient Instructions (Signed)
 VISIT SUMMARY:  Today, we discussed your concerns about a possible kidney stone, your elevated blood sugar levels, and your travel health management. We reviewed your current medications and made some adjustments to help manage your conditions more effectively.  YOUR PLAN:  -NEPHROLITHIASIS (KIDNEY STONE): You have a small kidney stone on the left side, which is causing morning pain. A kidney stone is a hard deposit made of minerals and salts that form inside your kidneys. We will order an abdominal x-ray to check if the stone has passed. Please continue taking Flomax as prescribed and increase your fluid intake to help pass the stone.  -MEDICATION SIDE EFFECTS: Topamax, which you are taking, may contribute to the formation of kidney stones. We will contact your neurologist to discuss possible alternative medications.  -TYPE 2 DIABETES MELLITUS: Your blood sugar levels have been elevated, with an A1c of 7.3%. Type 2 diabetes is a condition that affects the way your body processes blood sugar. We recommend starting Ozempic as prescribed to help control your blood sugar levels and continuing with Lantus. Please check with your pharmacy regarding the cost of Ozempic under your insurance.  -TRAVEL HEALTH MANAGEMENT: We understand your concerns about managing your health conditions while traveling, especially with your daughter recovering from brain surgery in [place]. We will review your x-ray results and communicate the findings via MyChart. Additionally, we will perform a kidney function blood test and discuss your medication management before your trip.  INSTRUCTIONS:  Please follow up with an abdominal x-ray to assess the passage of your kidney stone. Continue taking Flomax and increase your fluid intake. Start Ozempic as prescribed and continue with Lantus. Check with your pharmacy regarding the cost of Ozempic under your insurance. We will review your x-ray results and communicate the findings via  MyChart. Additionally, we will perform a kidney function blood test and discuss your medication management before your trip.
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
## 2023-05-20 NOTE — Assessment & Plan Note (Addendum)
 Followed by Endocrinology.  Elevated blood glucose, A1c 7.3%. Lantus ongoing, Ozempic not started due to travel. Ozempic expected to improve glycemic control and reduce cardiovascular risk. Cost under Medicare should not exceed $47/month after deductible. - Start Ozempic as prescribed. - Continue Lantus. - Check with pharmacy regarding Ozempic cost under insurance.

## 2023-05-21 ENCOUNTER — Other Ambulatory Visit: Payer: Self-pay | Admitting: Adult Health

## 2023-05-21 ENCOUNTER — Encounter: Payer: Self-pay | Admitting: Adult Health

## 2023-05-21 MED ORDER — TOPIRAMATE 25 MG PO TABS
25.0000 mg | ORAL_TABLET | Freq: Two times a day (BID) | ORAL | 0 refills | Status: DC
Start: 2023-05-21 — End: 2023-07-01

## 2023-05-22 ENCOUNTER — Other Ambulatory Visit: Payer: Self-pay

## 2023-05-22 DIAGNOSIS — E875 Hyperkalemia: Secondary | ICD-10-CM

## 2023-05-25 ENCOUNTER — Encounter: Payer: Self-pay | Admitting: Internal Medicine

## 2023-05-25 ENCOUNTER — Other Ambulatory Visit (INDEPENDENT_AMBULATORY_CARE_PROVIDER_SITE_OTHER): Payer: Medicare (Managed Care)

## 2023-05-25 DIAGNOSIS — Z794 Long term (current) use of insulin: Secondary | ICD-10-CM

## 2023-05-25 DIAGNOSIS — E875 Hyperkalemia: Secondary | ICD-10-CM

## 2023-05-25 LAB — BASIC METABOLIC PANEL WITH GFR
BUN: 13 mg/dL (ref 6–23)
CO2: 24 meq/L (ref 19–32)
Calcium: 9.4 mg/dL (ref 8.4–10.5)
Chloride: 109 meq/L (ref 96–112)
Creatinine, Ser: 1.02 mg/dL (ref 0.40–1.20)
GFR: 54.38 mL/min — ABNORMAL LOW (ref >60.00)
Glucose, Bld: 173 mg/dL — ABNORMAL HIGH (ref 70–99)
Potassium: 4.7 meq/L (ref 3.5–5.1)
Sodium: 142 meq/L (ref 135–145)

## 2023-05-26 ENCOUNTER — Encounter: Payer: Self-pay | Admitting: Family

## 2023-05-26 MED ORDER — INSULIN GLARGINE 100 UNIT/ML ~~LOC~~ SOLN: SUBCUTANEOUS | 6 refills | Status: DC

## 2023-05-27 ENCOUNTER — Other Ambulatory Visit: Payer: Medicare (Managed Care)

## 2023-05-28 ENCOUNTER — Other Ambulatory Visit: Payer: Self-pay | Admitting: Internal Medicine

## 2023-05-28 DIAGNOSIS — Z794 Long term (current) use of insulin: Secondary | ICD-10-CM

## 2023-05-28 MED ORDER — BD PEN NEEDLE NANO 2ND GEN 32G X 4 MM MISC: 1.0000 | Freq: Every day | 3 refills | Status: DC

## 2023-05-28 MED ORDER — INSULIN GLARGINE 100 UNIT/ML SOLOSTAR PEN
20.0000 [IU] | PEN_INJECTOR | Freq: Every day | SUBCUTANEOUS | 6 refills | Status: DC
Start: 2023-05-28 — End: 2023-06-02

## 2023-05-28 NOTE — Addendum Note (Signed)
 Addended by: Waneta Gut on: 05/28/2023 04:54 PM   Modules accepted: Orders

## 2023-06-19 ENCOUNTER — Other Ambulatory Visit: Payer: Self-pay | Admitting: Adult Health

## 2023-06-25 ENCOUNTER — Telehealth: Payer: Self-pay

## 2023-06-25 NOTE — Patient Outreach (Signed)
 First telephone outreach attempt to obtain mRS. No answer. Left message for returned call.  [facility]  Population Health Care Management Assistant  Direct Dial: [phone]  Fax: [phone] Website: [email or web address]

## 2023-06-29 ENCOUNTER — Encounter: Payer: Self-pay | Admitting: Internal Medicine

## 2023-06-29 ENCOUNTER — Ambulatory Visit: Payer: Self-pay | Admitting: Family

## 2023-06-29 ENCOUNTER — Ambulatory Visit: Payer: Medicare (Managed Care) | Admitting: Internal Medicine

## 2023-06-29 VITALS — BP 130/70 | HR 61 | Temp 98.1°F | Resp 12 | Ht 65.0 in | Wt 153.0 lb

## 2023-06-29 DIAGNOSIS — H9202 Otalgia, left ear: Secondary | ICD-10-CM | POA: Diagnosis not present

## 2023-06-29 DIAGNOSIS — R079 Chest pain, unspecified: Secondary | ICD-10-CM

## 2023-06-29 NOTE — Telephone Encounter (Signed)
 Chief Complaint: Left ear pain since yesterday  Symptoms: Throbbing pain, kept pt up during night, runny nose Pertinent Negatives: Patient denies cough, fever, headache, stiff neck, dizziness  Disposition: [x] Appointment (In office)  Additional Notes: Patient scheduled for an appointment today in office. This RN educated pt on new-worsening symptoms and when to call back/seek emergent care. Pt verbalized understanding and agrees to plan.     Copied from CRM [id number]. Topic: Clinical - Red Word Triage >> Jun 29, 2023  8:58 AM Hamp Levine R wrote: [facility] that prompted transfer to Nurse Triage: Painful ear ache in her left ear. Pain is so bad kept her up overnight. Been experiencing symptoms since yesterday afternoon. Attempted to treat with ear drops and Tylenol  but the pain is not getting any better. Reason for Disposition  [1] SEVERE pain AND [2] not improved 2 hours after taking analgesic medication (e.g., ibuprofen  or acetaminophen )  Answer Assessment - Initial Assessment Questions 1. LOCATION: "Which ear is involved?"     Left ear 2. ONSET: "When did the ear start hurting"      Yesterday afternoon 3. SEVERITY: "How bad is the pain?"  (Scale 1-10; mild, moderate or severe)   - MILD (1-3): doesn't interfere with normal activities    - MODERATE (4-7): interferes with normal activities or awakens from sleep    - SEVERE (8-10): excruciating pain, unable to do any normal activities      8-9/10 4. URI SYMPTOMS: "Do you have a runny nose or cough?"     Runny nose 5. FEVER: "Do you have a fever?" If Yes, ask: "What is your temperature, how was it measured, and when did it start?"     No 6. CAUSE: "Have you been swimming recently?", "How often do you use Q-TIPS?", "Have you had any recent air travel or scuba diving?"     No  Protocols used: Louie Rover

## 2023-06-29 NOTE — Patient Instructions (Signed)
 Apply heating pad or a warm compress to the left ear  Tylenol   500 mg OTC 2 tabs a day every 8 hours as needed for pain  Avoid big bites, chewing or meats for few days  If you get worse, you have bleeding or discharge from the ear, you have fevers: Please call the office
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
## 2023-06-30 ENCOUNTER — Telehealth: Payer: Self-pay

## 2023-06-30 NOTE — Patient Outreach (Signed)
 Telephone outreach to patient to obtain mRS was successfully completed. MRS= 0   Vanice Sarah [facility] Health Care Management Assistant  Direct Dial: [phone]  Fax: [phone] Website: [email or web address]

## 2023-07-01 ENCOUNTER — Other Ambulatory Visit: Payer: Self-pay | Admitting: Family

## 2023-07-01 ENCOUNTER — Ambulatory Visit: Payer: Self-pay | Admitting: Family

## 2023-07-01 ENCOUNTER — Ambulatory Visit (INDEPENDENT_AMBULATORY_CARE_PROVIDER_SITE_OTHER): Payer: Medicare (Managed Care) | Admitting: Family

## 2023-07-01 VITALS — BP 124/65 | HR 59 | Temp 98.4°F | Resp 16 | Ht 65.0 in | Wt 154.0 lb

## 2023-07-01 DIAGNOSIS — E785 Hyperlipidemia, unspecified: Secondary | ICD-10-CM | POA: Diagnosis not present

## 2023-07-01 DIAGNOSIS — G43909 Migraine, unspecified, not intractable, without status migrainosus: Secondary | ICD-10-CM | POA: Diagnosis not present

## 2023-07-01 DIAGNOSIS — E1165 Type 2 diabetes mellitus with hyperglycemia: Secondary | ICD-10-CM | POA: Diagnosis not present

## 2023-07-01 DIAGNOSIS — Z1211 Encounter for screening for malignant neoplasm of colon: Secondary | ICD-10-CM

## 2023-07-01 DIAGNOSIS — I10 Essential (primary) hypertension: Secondary | ICD-10-CM

## 2023-07-01 DIAGNOSIS — Z794 Long term (current) use of insulin: Secondary | ICD-10-CM | POA: Diagnosis not present

## 2023-07-01 DIAGNOSIS — N200 Calculus of kidney: Secondary | ICD-10-CM | POA: Diagnosis not present

## 2023-07-01 DIAGNOSIS — H8110 Benign paroxysmal vertigo, unspecified ear: Secondary | ICD-10-CM

## 2023-07-01 DIAGNOSIS — G500 Trigeminal neuralgia: Secondary | ICD-10-CM

## 2023-07-01 DIAGNOSIS — H6992 Unspecified Eustachian tube disorder, left ear: Secondary | ICD-10-CM | POA: Insufficient documentation

## 2023-07-01 LAB — MICROALBUMIN / CREATININE URINE RATIO
Creatinine,U: 89.3 mg/dL
Microalb Creat Ratio: 76.7 mg/g — ABNORMAL HIGH (ref 0.0–30.0)
Microalb, Ur: 6.8 mg/dL — ABNORMAL HIGH (ref 0.0–1.9)

## 2023-07-01 LAB — LIPID PANEL
Cholesterol: 97 mg/dL (ref 0–200)
HDL: 50.3 mg/dL (ref >39.00)
LDL Cholesterol: 33 mg/dL (ref 0–99)
NonHDL: 46.59
Total CHOL/HDL Ratio: 2
Triglycerides: 69 mg/dL (ref 0.0–149.0)
VLDL: 13.8 mg/dL (ref 0.0–40.0)

## 2023-07-01 LAB — HEMOGLOBIN A1C: Hgb A1c MFr Bld: 7.3 % — ABNORMAL HIGH (ref 4.6–6.5)

## 2023-07-01 NOTE — Assessment & Plan Note (Addendum)
 Lab Results  Component Value Date   CHOL 198 03/13/2023   HDL 50 03/13/2023   LDLCALC 126 (H) 03/13/2023   TRIG 109 03/13/2023   CHOLHDL 4.0 03/13/2023   Last LDL above goal. Continues crestor . Update lipid panel.
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
## 2023-07-01 NOTE — Assessment & Plan Note (Signed)
 No recent dizziness. Monitor.

## 2023-07-01 NOTE — Assessment & Plan Note (Signed)
 Notes HA today, denies any true migraines.

## 2023-07-01 NOTE — Assessment & Plan Note (Addendum)
 Lab Results  Component Value Date   HGBA1C 7.3 (H) 03/13/2023   HGBA1C 7.4 (A) 10/21/2022   HGBA1C 7.3 (A) 04/11/2022   Lab Results  Component Value Date   MICROALBUR 41.2 (H) 05/30/2022   LDLCALC 126 (H) 03/13/2023   CREATININE 1.02 05/25/2023   Followed by endo.  Update A1C.

## 2023-07-01 NOTE — Assessment & Plan Note (Signed)
 At goal on losartan , ontinue same.

## 2023-07-01 NOTE — Assessment & Plan Note (Signed)
No current issues, monitor

## 2023-07-01 NOTE — Assessment & Plan Note (Addendum)
 Resolved on follow up imaging. Neuro tapered her off of topamax .

## 2023-07-01 NOTE — Assessment & Plan Note (Signed)
 Recommended trial of short term claritin and flonase.  She is advised to call me if no improvement and we will plan referral to ENT.

## 2023-07-01 NOTE — Patient Instructions (Signed)
 VISIT SUMMARY:  Today, we addressed your left ear pain, a bruise on your arm, and reviewed your diabetes and hypertension management. We also discussed general health maintenance and upcoming preventive care needs.  YOUR PLAN:  EUSTACHIAN TUBE DYSFUNCTION: You likely have eustachian tube dysfunction causing increased pressure in your ear. -Use Flonase, two sprays in each nostril once daily. -Take Claritin (Loratadine) once daily. -If symptoms persist after two weeks, consider seeing an ENT specialist.  TYPE 2 DIABETES MELLITUS: Your diabetes management was reviewed, and we discussed the importance of weight loss in improving your blood sugar control. -Check your A1c today. -Discuss with Dr. Rosalea Collin about resuming Semaglutide . -Schedule a diabetic eye exam with an ophthalmologist and request records.  HYPERTENSION: Your hypertension is well-controlled with Losartan , which also provides kidney protection. -Continue taking Losartan  as prescribed. -Monitor your potassium levels closely.  HEMATOMA OF THE ARM: The bruise on your arm is likely due to a fall and should resolve over time. -Monitor the bruise for changes in color and size.  GENERAL HEALTH MAINTENANCE: We discussed the importance of vaccines and preventive care. -Consider getting the Shingrix vaccine at the pharmacy to prevent shingles. -Consider getting a COVID booster at the pharmacy.  COLONOSCOPY: You are due for a colonoscopy, and we discussed referral options for this procedure. -We will refer you to a gastroenterologist in [place]  for your colonoscopy.

## 2023-07-06 ENCOUNTER — Telehealth: Payer: Self-pay | Admitting: Family

## 2023-07-06 NOTE — Telephone Encounter (Signed)
 It looks like she forgot to stop by the lab on the way out.

## 2023-07-07 NOTE — Telephone Encounter (Signed)
 Patient did go to lab this day, cmp was not added. Calling lab to see if this can be added today

## 2023-07-07 NOTE — Addendum Note (Signed)
 Addended by: Joye Nobles on: 07/07/2023 08:44 AM   Modules accepted: Orders

## 2023-07-07 NOTE — Telephone Encounter (Signed)
 Patient called to come in for test. Order changed to future

## 2023-07-08 ENCOUNTER — Other Ambulatory Visit (INDEPENDENT_AMBULATORY_CARE_PROVIDER_SITE_OTHER): Payer: Medicare (Managed Care)

## 2023-07-08 DIAGNOSIS — Z794 Long term (current) use of insulin: Secondary | ICD-10-CM | POA: Diagnosis not present

## 2023-07-08 DIAGNOSIS — E1165 Type 2 diabetes mellitus with hyperglycemia: Secondary | ICD-10-CM

## 2023-07-08 LAB — COMPREHENSIVE METABOLIC PANEL WITH GFR
ALT: 27 U/L (ref 0–35)
AST: 28 U/L (ref 0–37)
Albumin: 4.3 g/dL (ref 3.5–5.2)
Alkaline Phosphatase: 74 U/L (ref 39–117)
BUN: 15 mg/dL (ref 6–23)
CO2: 28 meq/L (ref 19–32)
Calcium: 9.5 mg/dL (ref 8.4–10.5)
Chloride: 107 meq/L (ref 96–112)
Creatinine, Ser: 0.96 mg/dL (ref 0.40–1.20)
GFR: 58.44 mL/min — ABNORMAL LOW (ref >60.00)
Glucose, Bld: 166 mg/dL — ABNORMAL HIGH (ref 70–99)
Potassium: 5.1 meq/L (ref 3.5–5.1)
Sodium: 140 meq/L (ref 135–145)
Total Bilirubin: 0.6 mg/dL (ref 0.2–1.2)
Total Protein: 6.3 g/dL (ref 6.0–8.3)

## 2023-07-17 ENCOUNTER — Other Ambulatory Visit: Payer: Self-pay | Admitting: Family

## 2023-07-17 DIAGNOSIS — J069 Acute upper respiratory infection, unspecified: Secondary | ICD-10-CM

## 2023-07-26 ENCOUNTER — Other Ambulatory Visit: Payer: Self-pay | Admitting: Family

## 2023-07-26 ENCOUNTER — Other Ambulatory Visit: Payer: Self-pay | Admitting: Adult Health

## 2023-08-18 NOTE — Progress Notes (Signed)
 [facility] Quality Team Note  Name: Jacqueline [name] Date of Birth: [birth year] MRN: [id number] Date: 08/18/2023  [facility] Quality Team has reviewed this patient's chart, please see recommendations below:  [facility] Quality Other; (CHART REVIEWED FOR [facility] MEASURE. ABSTRACTED HOSPITAL FOLLOW UP VISIT FOR DISCHARGE ON 03/19/2023)

## 2023-09-03 ENCOUNTER — Telehealth: Payer: Self-pay | Admitting: Family

## 2023-09-03 NOTE — Telephone Encounter (Signed)
 Copied from CRM [id number]. Topic: Medicare AWV >> Sep 03, 2023 10:45 AM Jacqueline DEL wrote: Reason for CRM: Called 09/03/2023 to sched AWV - NO VOICEMAIL  Jacqueline [name]; Care Guide Ambulatory Clinical Support [place] [facility] Direct Dial: [phone]

## 2023-09-30 ENCOUNTER — Other Ambulatory Visit: Payer: Self-pay | Admitting: Family

## 2023-10-06 ENCOUNTER — Encounter: Payer: Self-pay | Admitting: Internal Medicine

## 2023-10-15 ENCOUNTER — Telehealth: Payer: Self-pay | Admitting: Adult Health

## 2023-10-15 NOTE — Telephone Encounter (Signed)
 [facility] conf

## 2023-10-15 NOTE — Progress Notes (Signed)
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 and right brain TIA given risk factors versus complicated migraine on 07/13/2018 after presenting with 3 episodes of intermittent numbness/tingling in left arm and face x3 days followed by headache.  Returned to ED the evening after discharge due to return of symptoms with diagnosis of likely complicated migraine, Topamax  initiated with benefit.  Vascular risk factors include HTN, HLD, carotid stenosis, uncontrolled DM, chronic BPPV, obesity, prior stroke on imaging.  She also complains of gradual worsening  bilateral hand and foot numbness likely due to diabetic neuropathy.    PLAN:  right parietal subcortical stroke: History of prior stroke and possible TIA:  Mild LLE weakness and gait impairment  Continue aspirin , clopidogrel  75 mg daily, Zetia  hand Crestor  40mg  daily for secondary stroke prevention managed/prescribed by PCP.  Ongoing duration of DAPT will be determined by [facility] Ensure close PCP and endocrinology follow-up for aggressive stroke risk factor management including BP goal<130/90, HLD with LDL goal<70 and DM with A1c.<7   Carotid stenosis, right: S/p TCAR 03/2023: Follow-up with vascular surgery as scheduled next month with repeat carotid  duplex CUS 03/2023 right ICA 40 to 59% stenosis although velocities underestimated secondary to shadowing from plaque, left ICA 1 to 39% stenosis CTA head/neck 03/2023 showed right ICA 75% stenosis, left ICA <50% stenosis Repeat CUS order placed 03/2022 for surveillance monitoring but never completed for unclear reason CUS 04/2021 right ICA 40 to 59% stenosis, left ICA 1 to 39% stenosis CUS 09/2020 right ICA 40 to 59% stenosis, left ICA 1 to 39% stenosis  Complicated migraine:  Vascular headaches: No recent migrainous type headaches Now off topiramate  due to kidney stones Mild headaches about 1-2x per month. Will hold off on restarting preventative treatment but can consider in the future if headaches worsen Continue Tylenol  as needed  Neuropathy Likely diabetic neuropathy Recommend trial of gabapentin  100 mg 3 times daily as needed.  Discussed potential side effects.  Can consider increasing dosage in the future if needed Start Elma ointment as needed Discussed importance of diabetic control and healthy dietary choices Advised to call if symptoms should worsen     Follow up in 6 months via MyChart video visit or call earlier if needed    CC:  Pcp, No    I personally spent a total of 42 minutes in the care of the patient today including preparing to see the patient, getting/reviewing separately obtained history, performing a medically appropriate exam/evaluation, counseling and educating, placing orders, and documenting clinical information in the EHR.   Harlene Bogaert, AGNP-BC  [facility] [address] [place], [place] [address]  Phone [phone] Fax [phone] Note: This document was prepared with digital dictation and possible smart phrase technology. Any transcriptional errors that result from this process are unintentional.

## 2023-10-19 ENCOUNTER — Ambulatory Visit: Payer: Medicare (Managed Care) | Admitting: Adult Health

## 2023-10-19 ENCOUNTER — Encounter: Payer: Self-pay | Admitting: Adult Health

## 2023-10-19 ENCOUNTER — Other Ambulatory Visit: Payer: Self-pay | Admitting: Family

## 2023-10-19 VITALS — BP 131/62 | HR 76 | Ht 65.0 in | Wt 162.0 lb

## 2023-10-19 DIAGNOSIS — I63239 Cerebral infarction due to unspecified occlusion or stenosis of unspecified carotid arteries: Secondary | ICD-10-CM | POA: Diagnosis not present

## 2023-10-19 DIAGNOSIS — I639 Cerebral infarction, unspecified: Secondary | ICD-10-CM

## 2023-10-19 DIAGNOSIS — G43109 Migraine with aura, not intractable, without status migrainosus: Secondary | ICD-10-CM | POA: Diagnosis not present

## 2023-10-19 DIAGNOSIS — G441 Vascular headache, not elsewhere classified: Secondary | ICD-10-CM

## 2023-10-19 DIAGNOSIS — G629 Polyneuropathy, unspecified: Secondary | ICD-10-CM

## 2023-10-19 MED ORDER — LIDOCAINE-PRILOCAINE 2.5-2.5 % EX CREA: 1.0000 | TOPICAL_CREAM | CUTANEOUS | 5 refills | Status: AC | PRN

## 2023-10-19 MED ORDER — GABAPENTIN 100 MG PO CAPS: 100.0000 mg | ORAL_CAPSULE | Freq: Three times a day (TID) | ORAL | 11 refills | Status: AC | PRN

## 2023-10-19 NOTE — Patient Instructions (Addendum)
 Your Plan:  Start gabapentin  100mg  three times daily as needed for neuropathy - please call if no benefit or sooner if difficulty tolerating  Start Elma ointment as needed for nerve pain  Please let me know if headaches should worsen to discuss other treatment options   Continue current stroke prevention medications and continued follow up with PCP and vascular surgery as scheduled      Follow up in 6 months or call earlier if needed     Thank you for coming to see us  at [facility]. I hope we have been able to provide you high quality care today.  You may receive a patient satisfaction survey over the next few weeks. We would appreciate your feedback and comments so that we may continue to improve ourselves and the health of our patients.

## 2023-10-21 ENCOUNTER — Telehealth: Payer: Self-pay | Admitting: Family

## 2023-10-21 MED ORDER — ROSUVASTATIN CALCIUM 40 MG PO TABS: 40.0000 mg | ORAL_TABLET | Freq: Every day | ORAL | 0 refills | Status: DC

## 2023-10-21 MED ORDER — EZETIMIBE 10 MG PO TABS: 10.0000 mg | ORAL_TABLET | Freq: Every day | ORAL | 0 refills | Status: DC

## 2023-10-21 NOTE — Telephone Encounter (Signed)
 Pt needs medication rosuvastatin  calcium  40 mg and ezetimibe  10 mg refilled to [facility] [address], [place], [place] [address] [place] . The pts moved to [place]  and dr. Daryl removed herself as pcp but the pt states she would like to remain a pt here if she could.

## 2023-10-21 NOTE — Telephone Encounter (Signed)
**Note De-identified  [place] Obfuscation** Please advise 

## 2023-10-27 ENCOUNTER — Encounter (HOSPITAL_COMMUNITY): Payer: Medicare (Managed Care)

## 2023-10-27 ENCOUNTER — Ambulatory Visit: Payer: Medicare (Managed Care)

## 2023-10-27 ENCOUNTER — Ambulatory Visit: Payer: Medicare (Managed Care) | Admitting: Internal Medicine

## 2023-10-27 ENCOUNTER — Encounter: Payer: Self-pay | Admitting: Internal Medicine

## 2023-10-27 VITALS — BP 120/78 | HR 61 | Ht 65.0 in | Wt 159.0 lb

## 2023-10-27 DIAGNOSIS — Z794 Long term (current) use of insulin: Secondary | ICD-10-CM | POA: Diagnosis not present

## 2023-10-27 DIAGNOSIS — E1165 Type 2 diabetes mellitus with hyperglycemia: Secondary | ICD-10-CM | POA: Diagnosis not present

## 2023-10-27 DIAGNOSIS — E1159 Type 2 diabetes mellitus with other circulatory complications: Secondary | ICD-10-CM

## 2023-10-27 DIAGNOSIS — E1142 Type 2 diabetes mellitus with diabetic polyneuropathy: Secondary | ICD-10-CM

## 2023-10-27 LAB — POCT GLUCOSE (DEVICE FOR HOME USE): POC Glucose: 142 mg/dL — AB (ref 70–99)

## 2023-10-27 LAB — POCT GLYCOSYLATED HEMOGLOBIN (HGB A1C): Hemoglobin A1C: 9.1 % — AB (ref 4.0–5.6)

## 2023-10-27 MED ORDER — GLIPIZIDE 5 MG PO TABS: 5.0000 mg | ORAL_TABLET | Freq: Every day | ORAL | 3 refills | Status: AC

## 2023-10-27 MED ORDER — LANTUS SOLOSTAR 100 UNIT/ML ~~LOC~~ SOPN: 20.0000 [IU] | PEN_INJECTOR | Freq: Every day | SUBCUTANEOUS | 3 refills | Status: DC

## 2023-10-27 MED ORDER — BD PEN NEEDLE NANO 2ND GEN 32G X 4 MM MISC: 1.0000 | Freq: Every day | 3 refills | Status: AC
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
## 2023-10-27 NOTE — Patient Instructions (Addendum)
-   Take Glipizide  5 mg, 1 tablet before the first meal of the day  - Continue Lantus  20 units daily    HOW TO TREAT LOW BLOOD SUGARS (Blood sugar LESS THAN 70 MG/DL) Please follow the RULE OF 15 for the treatment of hypoglycemia treatment (when your (blood sugars are less than 70 mg/dL)   STEP 1: Take 15 grams of carbohydrates when your blood sugar is low, which includes:  3-4 GLUCOSE TABS  OR 3-4 OZ OF JUICE OR REGULAR SODA OR ONE TUBE OF GLUCOSE GEL    STEP 2: RECHECK blood sugar in 15 MINUTES STEP 3: If your blood sugar is still low at the 15 minute recheck --> then, go back to STEP 1 and treat AGAIN with another 15 grams of carbohydrates.

## 2023-10-28 ENCOUNTER — Other Ambulatory Visit: Payer: Self-pay | Admitting: Internal Medicine

## 2023-11-03 ENCOUNTER — Ambulatory Visit
Admission: EM | Admit: 2023-11-03 | Discharge: 2023-11-03 | Disposition: A | Discharge status: Home / Self Care | Attending: Emergency Medicine | Admitting: Emergency Medicine

## 2023-11-03 ENCOUNTER — Ambulatory Visit: Admitting: Family Medicine

## 2023-11-03 ENCOUNTER — Ambulatory Visit: Payer: Self-pay

## 2023-11-03 DIAGNOSIS — H109 Unspecified conjunctivitis: Secondary | ICD-10-CM

## 2023-11-03 MED ORDER — MOXIFLOXACIN HCL 0.5 % OP SOLN: 1.0000 [drp] | Freq: Three times a day (TID) | OPHTHALMIC | 0 refills | Status: AC

## 2023-11-03 MED ORDER — PREDNISONE 20 MG PO TABS
20.0000 mg | ORAL_TABLET | Freq: Once | ORAL | Status: AC
  Administered 2023-11-03: 20 mg via ORAL

## 2023-11-03 NOTE — Discharge Instructions (Signed)
 Today you being treated for bacterial conjunctivitis.   You have been given a low-dose of prednisone  which is a steroid to help reduce swelling faster, should see improvement within the hour, will temporarily increase your blood sugar for the next 24 hours  Place one drop of moxifloxacin  into the effected eye every 8 hours while awake for 7 days. If the other eye starts to have symptoms you may use medication in it as well. Do not allow tip of dropper to touch eye.  May use cool compress for comfort and to remove discharge if present. Pat the eye, do not wipe.  Do not rub eyes, this may cause more irritation.  Please avoid use of eye makeup until symptoms clear.  If symptoms persist after use of medication, please follow up at Urgent Care or with ophthalmologist (eye doctor)
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
## 2023-11-03 NOTE — Telephone Encounter (Signed)
FYI. Pt going to ED.  

## 2023-11-03 NOTE — ED Triage Notes (Signed)
 Patient to Urgent Care with complaints of left sided eye pain/ swelling. Describes sharp pains in her eye. Eye tearing.   Symptoms started today (woke up with symptoms). Has seen some improvement through the day.

## 2023-11-03 NOTE — Telephone Encounter (Signed)
 FYI Only or Action Required?: FYI only for provider.  Patient was last seen in primary care on 07/01/2023 by Daryl Setter, NP.  Called Nurse Triage reporting Eye Problem.  Symptoms began yesterday.  Interventions attempted: Nothing.  Symptoms are: rapidly worsening.  Triage Disposition: Go to ED Now (Notify PCP)  Patient/caregiver understands and will follow disposition?: Yes     Copied from CRM [id number]. Topic: Clinical - Red Word Triage >> Nov 03, 2023  2:01 PM Jacqueline [name] wrote: Red Word that prompted transfer to Nurse Triage: Patient eye is swollen , blurry vision and painful. Reason for Disposition  [1] SEVERE eyelid swelling (i.e., shut or almost) AND [2] fever  Answer Assessment - Initial Assessment Questions 1. ONSET: When did the swelling start? (e.[name]., minutes, hours, days)     States sharp pain on left side yesterday 2. LOCATION: What part of the eyelids is swollen?     Left eye 3. SEVERITY: How swollen is it?     severe 4. ITCHING: Is there any itching? If Yes, ask: How much?   (Scale 1-10; mild, moderate or severe)     denies 5. PAIN: Is the swelling painful to touch? If Yes, ask: How painful is it?   (Scale 1-10; mild, moderate or severe)     Very painful 6. FEVER: Do you have a fever? If Yes, ask: What is it, how was it measured, and when did it start?      no 7. CAUSE: What do you think is causing the swelling?     unknown 8. RECURRENT SYMPTOM: Have you had eyelid swelling before? If Yes, ask: When was the last time? What happened that time?     no 9. OTHER SYMPTOMS: Do you have any other symptoms? (e.[name]., blurred vision, eye discharge, rash, runny nose)     Blurry vision, extreme pain 10. PREGNANCY: Is there any chance you are pregnant? When was your last menstrual period?       na  Protocols used: Eye - Swelling-A-AH

## 2023-11-24 ENCOUNTER — Encounter (HOSPITAL_COMMUNITY): Payer: Medicare (Managed Care)

## 2023-11-24 ENCOUNTER — Ambulatory Visit: Payer: Medicare (Managed Care)

## 2023-12-25 ENCOUNTER — Ambulatory Visit: Payer: Self-pay

## 2023-12-25 NOTE — Telephone Encounter (Signed)
 FYI Only or Action Required?: Action required by provider: request for appointment and referral request.-[place]   Patient was last seen in primary care on 07/01/2023 by Daryl Setter, NP.  Called Nurse Triage reporting Appointment and Referral.  Symptoms began Chronic.  Interventions attempted: Nothing.  Symptoms are: unchanged.  Triage Disposition: See PCP When Office is Open (Within 3 Days)  Patient/caregiver understands and will follow disposition?: No, refuses disposition   Copied from CRM #[id number]. Topic: Clinical - Red Word Triage >> Dec 25, 2023  4:39 PM Tinnie BROCKS wrote: Red Word that prompted transfer to Nurse Triage: numbness in hands and feet. She will be in town only until Monday, but could stay and extra day or so, to get an appt. She says this is a long term problem but worsening, another dr told her she had bad circulation in feet due to diabetes. Wants an appt to get referral to neurologist. Reason for Disposition  [1] Numbness or tingling in one or both hands AND [2] is a chronic symptom (recurrent or ongoing AND present > 4 weeks)  Answer Assessment - Initial Assessment Questions Additional info:  1) Patient called to request follow up visit to neuropathy with pcp on Monday, patient lives in [place] and is only in town for the weekend. No appointments are available with pcp until Dec, called to [facility] but nothing available for work in visit. 2) Requesting referral to neurology for chronic bilateral hand and feet tingling.  3) Patient will be back in town for Thanksgiving holiday and asking to schedule with pcp on 11/26/ opr 11/28-added to wait list.  4) No appointment scheduled at this time, patient would like call back re: neurology referral, requesting neurology that is close to her home in [place].    1. SYMPTOM: What is the main symptom you are concerned about? (e.g., weakness, numbness)     Chronic tingling hands and feet  2. ONSET:  When did this start? (e.g., minutes, hours, days; while sleeping)     chronic 3. LAST NORMAL: When was the last time you (the patient) were normal (no symptoms)?     N/a 4. PATTERN Does this come and go, or has it been constant since it started?  Is it present now?     constant 5. CARDIAC SYMPTOMS: Have you had any of the following symptoms: chest pain, difficulty breathing, palpitations?     denies 6. NEUROLOGIC SYMPTOMS: Have you had any of the following symptoms: headache, dizziness, vision loss, double vision, changes in speech, unsteady on your feet?     denies 7. OTHER SYMPTOMS: Do you have any other symptoms?     denies 8. PREGNANCY: Is there any chance you are pregnant? When was your last menstrual period?  Protocols used: Neurologic Deficit-A-AH

## 2023-12-28 ENCOUNTER — Other Ambulatory Visit: Payer: Self-pay

## 2023-12-28 ENCOUNTER — Telehealth: Payer: Self-pay | Admitting: Family

## 2023-12-28 MED ORDER — EZETIMIBE 10 MG PO TABS: 10.0000 mg | ORAL_TABLET | Freq: Every day | ORAL | 0 refills | Status: AC

## 2023-12-28 MED ORDER — ROSUVASTATIN CALCIUM 40 MG PO TABS
40.0000 mg | ORAL_TABLET | Freq: Every day | ORAL | 0 refills | Status: AC
Start: 2023-12-28 — End: ?

## 2023-12-28 NOTE — Telephone Encounter (Signed)
 Called patient but no answer, left voice mail for patient to call back.  This is being address and documented on another phone note from 11/17

## 2023-12-28 NOTE — Telephone Encounter (Signed)
 Pt is having numbness in her fingers and pain in her neck and wants to know what to do. She wanted an appointment today but there were none available for her. She also needs her rosuvastatin , glipitzide, and ezetimibe  refilled. She wants to be called at [phone] with any information

## 2023-12-28 NOTE — Telephone Encounter (Signed)
 Called twice and no answer, lvm for patient to call back. E2C2 please transfer patient to office.  Rosuvastatin  and Ezetimibe  sent to pharmacy/ glipizide  per endo and has refill.

## 2023-12-28 NOTE — Telephone Encounter (Signed)
 Called patient back a few times but again no answer

## 2023-12-28 NOTE — Telephone Encounter (Unsigned)
 Copied from CRM #[id number]. Topic: General - Other >> Dec 28, 2023 12:03 PM Victoria A wrote: Reason for CRM: Patient was returning call for [place]- Agent called [facility] but [facility] was in with a patient-please call

## 2024-01-17 ENCOUNTER — Other Ambulatory Visit: Payer: Self-pay | Admitting: Family

## 2024-01-17 NOTE — Telephone Encounter (Signed)
 Please contact pt to schedule follow up.

## 2024-01-18 NOTE — Telephone Encounter (Signed)
 Called patient but no answer, left voice mail for patient to call back.

## 2024-01-22 NOTE — Telephone Encounter (Signed)
 Talked to patient and she will call after the 1st of the year for follow up

## 2024-02-23 ENCOUNTER — Ambulatory Visit (HOSPITAL_COMMUNITY)
Admission: RE | Admit: 2024-02-23 | Discharge: 2024-02-23 | Disposition: A | Discharge status: Home / Self Care | Payer: Self-pay | Source: Ambulatory Visit | Attending: Physician Assistant | Admitting: Physician Assistant

## 2024-02-23 ENCOUNTER — Ambulatory Visit: Payer: Medicare (Managed Care) | Admitting: Physician Assistant

## 2024-02-23 VITALS — BP 136/76 | HR 64 | Temp 97.7°F | Wt 164.1 lb

## 2024-02-23 DIAGNOSIS — I6529 Occlusion and stenosis of unspecified carotid artery: Secondary | ICD-10-CM | POA: Diagnosis not present

## 2024-02-23 DIAGNOSIS — I6523 Occlusion and stenosis of bilateral carotid arteries: Secondary | ICD-10-CM
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
## 2024-03-04 ENCOUNTER — Encounter: Payer: Self-pay | Admitting: Family

## 2024-03-07 MED ORDER — CLOPIDOGREL BISULFATE 75 MG PO TABS: 75.0000 mg | ORAL_TABLET | Freq: Every day | ORAL | 1 refills | Status: AC

## 2024-04-26 ENCOUNTER — Ambulatory Visit: Admitting: Internal Medicine

## 2024-04-28 ENCOUNTER — Telehealth: Admitting: Adult Health
